# Patient Record
Sex: Female | Born: 1935
Health system: Southern US, Community
[De-identification: ages and names within clinical notes are randomized; demographics above are authoritative.]

## PROBLEM LIST (undated history)

## (undated) DIAGNOSIS — N390 Urinary tract infection, site not specified: Secondary | ICD-10-CM

## (undated) DIAGNOSIS — M949 Disorder of cartilage, unspecified: Secondary | ICD-10-CM

## (undated) DIAGNOSIS — C50911 Malignant neoplasm of unspecified site of right female breast: Secondary | ICD-10-CM

## (undated) DIAGNOSIS — C229 Malignant neoplasm of liver, not specified as primary or secondary: Secondary | ICD-10-CM

## (undated) DIAGNOSIS — I10 Essential (primary) hypertension: Secondary | ICD-10-CM

## (undated) DIAGNOSIS — F05 Delirium due to known physiological condition: Secondary | ICD-10-CM

## (undated) DIAGNOSIS — F32A Depression, unspecified: Secondary | ICD-10-CM

## (undated) DIAGNOSIS — E079 Disorder of thyroid, unspecified: Secondary | ICD-10-CM

## (undated) DIAGNOSIS — J189 Pneumonia, unspecified organism: Secondary | ICD-10-CM

## (undated) DIAGNOSIS — G252 Other specified forms of tremor: Secondary | ICD-10-CM

## (undated) DIAGNOSIS — R410 Disorientation, unspecified: Secondary | ICD-10-CM

## (undated) DIAGNOSIS — F329 Major depressive disorder, single episode, unspecified: Secondary | ICD-10-CM

## (undated) DIAGNOSIS — K620 Anal polyp: Secondary | ICD-10-CM

## (undated) DIAGNOSIS — C50919 Malignant neoplasm of unspecified site of unspecified female breast: Secondary | ICD-10-CM

## (undated) DIAGNOSIS — G250 Essential tremor: Secondary | ICD-10-CM

## (undated) DIAGNOSIS — K5900 Constipation, unspecified: Secondary | ICD-10-CM

## (undated) DIAGNOSIS — K621 Rectal polyp: Secondary | ICD-10-CM

## (undated) DIAGNOSIS — K5732 Diverticulitis of large intestine without perforation or abscess without bleeding: Secondary | ICD-10-CM

## (undated) DIAGNOSIS — K219 Gastro-esophageal reflux disease without esophagitis: Secondary | ICD-10-CM

## (undated) DIAGNOSIS — Z8489 Family history of other specified conditions: Secondary | ICD-10-CM

## (undated) DIAGNOSIS — I639 Cerebral infarction, unspecified: Secondary | ICD-10-CM

## (undated) DIAGNOSIS — E039 Hypothyroidism, unspecified: Secondary | ICD-10-CM

## (undated) DIAGNOSIS — M899 Disorder of bone, unspecified: Secondary | ICD-10-CM

## (undated) DIAGNOSIS — K5730 Diverticulosis of large intestine without perforation or abscess without bleeding: Secondary | ICD-10-CM

## (undated) DIAGNOSIS — E785 Hyperlipidemia, unspecified: Secondary | ICD-10-CM

## (undated) HISTORY — DX: Rectal polyp: K62.1

## (undated) HISTORY — DX: Essential (primary) hypertension: I10

## (undated) HISTORY — DX: Essential tremor: G25.2

## (undated) HISTORY — DX: Disorder of bone, unspecified: M89.9

## (undated) HISTORY — DX: Hypothyroidism, unspecified: E03.9

## (undated) HISTORY — PX: CATARACT EXTRACTION W/ INTRAOCULAR LENS  IMPLANT, BILATERAL: SHX1307

## (undated) HISTORY — PX: ABDOMINAL HYSTERECTOMY: SHX81

## (undated) HISTORY — DX: Diverticulosis of large intestine without perforation or abscess without bleeding: K57.30

## (undated) HISTORY — DX: Essential tremor: G25.0

## (undated) HISTORY — DX: Delirium due to known physiological condition: F05

## (undated) HISTORY — DX: Constipation, unspecified: K59.00

## (undated) HISTORY — DX: Hyperlipidemia, unspecified: E78.5

## (undated) HISTORY — DX: Malignant neoplasm of unspecified site of unspecified female breast: C50.919

## (undated) HISTORY — DX: Disorientation, unspecified: R41.0

## (undated) HISTORY — PX: CHOLECYSTECTOMY: SHX55

## (undated) HISTORY — PX: RECTAL POLYPECTOMY: SHX2309

## (undated) HISTORY — DX: Anal polyp: K62.0

## (undated) HISTORY — DX: Disorder of cartilage, unspecified: M94.9

## (undated) HISTORY — PX: TUBAL LIGATION: SHX77

## (undated) HISTORY — DX: Disorder of thyroid, unspecified: E07.9

## (undated) HISTORY — PX: APPENDECTOMY: SHX54

---
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
## 1969-08-13 DIAGNOSIS — C50911 Malignant neoplasm of unspecified site of right female breast: Secondary | ICD-10-CM

## 1969-08-13 HISTORY — PX: MASTECTOMY: SHX3

## 1969-08-13 HISTORY — PX: BREAST BIOPSY: SHX20

## 1969-08-13 HISTORY — DX: Malignant neoplasm of unspecified site of right female breast: C50.911

## 2001-08-14 LAB — HM COLONOSCOPY

## 2005-06-18 ENCOUNTER — Ambulatory Visit (HOSPITAL_COMMUNITY): Admission: RE | Admit: 2005-06-18 | Discharge: 2005-06-18 | Payer: Self-pay | Admitting: Family Medicine

## 2006-10-23 ENCOUNTER — Ambulatory Visit (HOSPITAL_COMMUNITY): Admission: RE | Admit: 2006-10-23 | Discharge: 2006-10-23 | Payer: Self-pay | Admitting: Family Medicine

## 2007-11-17 ENCOUNTER — Ambulatory Visit (HOSPITAL_COMMUNITY): Admission: RE | Admit: 2007-11-17 | Discharge: 2007-11-17 | Payer: Self-pay | Admitting: Family Medicine

## 2009-01-05 ENCOUNTER — Encounter: Admission: RE | Admit: 2009-01-05 | Discharge: 2009-01-05 | Payer: Self-pay | Admitting: Family Medicine

## 2010-03-27 ENCOUNTER — Encounter: Admission: RE | Admit: 2010-03-27 | Discharge: 2010-03-27 | Payer: Self-pay | Admitting: Internal Medicine

## 2010-03-30 ENCOUNTER — Ambulatory Visit (HOSPITAL_COMMUNITY): Admission: RE | Admit: 2010-03-30 | Discharge: 2010-03-30 | Payer: Self-pay | Admitting: Internal Medicine

## 2010-03-31 ENCOUNTER — Encounter: Admission: RE | Admit: 2010-03-31 | Discharge: 2010-03-31 | Payer: Self-pay | Admitting: Internal Medicine

## 2010-04-02 ENCOUNTER — Encounter: Admission: RE | Admit: 2010-04-02 | Discharge: 2010-04-02 | Payer: Self-pay | Admitting: Internal Medicine

## 2010-04-05 ENCOUNTER — Encounter: Admission: RE | Admit: 2010-04-05 | Discharge: 2010-04-05 | Payer: Self-pay | Admitting: Internal Medicine

## 2010-04-05 LAB — HM MAMMOGRAPHY

## 2010-09-03 ENCOUNTER — Encounter: Payer: Self-pay | Admitting: Internal Medicine

## 2011-02-05 ENCOUNTER — Ambulatory Visit: Payer: Self-pay | Admitting: Gastroenterology

## 2011-03-15 ENCOUNTER — Emergency Department (HOSPITAL_COMMUNITY): Payer: Medicare Other

## 2011-03-15 ENCOUNTER — Emergency Department (HOSPITAL_COMMUNITY)
Admission: EM | Admit: 2011-03-15 | Discharge: 2011-03-15 | Disposition: A | Discharge status: Home / Self Care | Payer: Medicare Other | Attending: Emergency Medicine | Admitting: Emergency Medicine

## 2011-03-15 DIAGNOSIS — Z7982 Long term (current) use of aspirin: Secondary | ICD-10-CM | POA: Insufficient documentation

## 2011-03-15 DIAGNOSIS — I10 Essential (primary) hypertension: Secondary | ICD-10-CM | POA: Insufficient documentation

## 2011-03-15 DIAGNOSIS — R143 Flatulence: Secondary | ICD-10-CM | POA: Insufficient documentation

## 2011-03-15 DIAGNOSIS — E039 Hypothyroidism, unspecified: Secondary | ICD-10-CM | POA: Insufficient documentation

## 2011-03-15 DIAGNOSIS — R141 Gas pain: Secondary | ICD-10-CM | POA: Insufficient documentation

## 2011-03-15 DIAGNOSIS — Z79899 Other long term (current) drug therapy: Secondary | ICD-10-CM | POA: Insufficient documentation

## 2011-03-15 DIAGNOSIS — R142 Eructation: Secondary | ICD-10-CM | POA: Insufficient documentation

## 2011-03-15 DIAGNOSIS — K5900 Constipation, unspecified: Secondary | ICD-10-CM | POA: Insufficient documentation

## 2011-08-22 DIAGNOSIS — M1710 Unilateral primary osteoarthritis, unspecified knee: Secondary | ICD-10-CM | POA: Diagnosis not present

## 2011-09-15 LAB — HM DEXA SCAN

## 2011-10-15 DIAGNOSIS — I10 Essential (primary) hypertension: Secondary | ICD-10-CM | POA: Diagnosis not present

## 2011-10-15 DIAGNOSIS — E039 Hypothyroidism, unspecified: Secondary | ICD-10-CM | POA: Diagnosis not present

## 2011-10-15 DIAGNOSIS — M899 Disorder of bone, unspecified: Secondary | ICD-10-CM | POA: Diagnosis not present

## 2011-10-15 DIAGNOSIS — M949 Disorder of cartilage, unspecified: Secondary | ICD-10-CM | POA: Diagnosis not present

## 2011-10-15 DIAGNOSIS — K5900 Constipation, unspecified: Secondary | ICD-10-CM | POA: Diagnosis not present

## 2012-05-16 ENCOUNTER — Ambulatory Visit (INDEPENDENT_AMBULATORY_CARE_PROVIDER_SITE_OTHER): Payer: Medicare Other | Admitting: Family Medicine

## 2012-05-16 VITALS — BP 123/73 | HR 74 | Temp 98.5°F | Resp 18 | Ht 63.5 in | Wt 178.0 lb

## 2012-05-16 DIAGNOSIS — R229 Localized swelling, mass and lump, unspecified: Secondary | ICD-10-CM | POA: Diagnosis not present

## 2012-05-16 DIAGNOSIS — B999 Unspecified infectious disease: Secondary | ICD-10-CM | POA: Diagnosis not present

## 2012-05-16 MED ORDER — FLUCONAZOLE 150 MG PO TABS: 150.0000 mg | ORAL_TABLET | Freq: Once | ORAL | Status: DC

## 2012-05-16 MED ORDER — AMOXICILLIN 500 MG PO TABS: 500.0000 mg | ORAL_TABLET | Freq: Three times a day (TID) | ORAL | Status: DC

## 2012-05-16 NOTE — Progress Notes (Signed)
[facility]:  Office Visit  Chief Complaint:  Chief Complaint  Patient presents with  . Facial Pain    sensitivity on right side of face, swelling, ? salivary gland inflamed    HPI: Destiny Velasquez is a 78 y.o. female who complains of  Right mandibular swelling x 3 days worse since last night. + sensitivity and pain. Has not tried any oral meds for it except cold compresses. No sick contacts, bug bites, skin infections. No other sxs.   Past Medical History  Diagnosis Date  . Hypertension   . Hyperlipidemia   . Thyroid disease    History reviewed. No pertinent past surgical history. History   Social History  . Marital Status: Married    Spouse Name: N/A    Number of Children: N/A  . Years of Education: N/A   Social History Main Topics  . Smoking status: Former Research scientist (life sciences)  . Smokeless tobacco: None  . Alcohol Use: No  . Drug Use: No  . Sexually Active: None   Other Topics Concern  . None   Social History Narrative  . None   Family History  Problem Relation Age of Onset  . Cancer Mother   . Hypertension Father    No Known Allergies Prior to Admission medications   Medication Sig Start Date End Date Taking? Authorizing Provider  aspirin 81 MG tablet Take 81 mg by mouth daily.   Yes Historical Provider, MD  estrogens, conjugated, (PREMARIN) 0.625 MG tablet Take 0.625 mg by mouth daily. Take daily for 21 days then do not take for 7 days.   Yes Historical Provider, MD  levothyroxine (SYNTHROID, LEVOTHROID) 100 MCG tablet Take 100 mcg by mouth daily.   Yes Historical Provider, MD  losartan (COZAAR) 50 MG tablet Take 50 mg by mouth daily.   Yes Historical Provider, MD  niacin 500 MG tablet Take 500 mg by mouth daily with breakfast.   Yes Historical Provider, MD  simvastatin (ZOCOR) 40 MG tablet Take 40 mg by mouth every evening.   Yes Historical Provider, MD  spironolactone-hydrochlorothiazide (ALDACTAZIDE) 25-25 MG per tablet Take 1 tablet by mouth daily.    Yes Historical Provider, MD     ROS: The patient denies fevers, chills, night sweats, unintentional weight loss, chest pain, palpitations, wheezing, dyspnea on exertion, nausea, vomiting, abdominal pain, dysuria, hematuria, melena, numbness, weakness, or tingling.   All other systems have been reviewed and were otherwise negative with the exception of those mentioned in the HPI and as above.    PHYSICAL EXAM: Filed Vitals:   05/16/12 1014  BP: 123/73  Pulse: 74  Temp: 98.5 F (36.9 C)  Resp: 18   Filed Vitals:   05/16/12 1014  Height: 5' 3.5" (1.613 m)  Weight: 178 lb (80.74 kg)   Body mass index is 31.04 kg/(m^2).  General: Alert, no acute distress HEENT:  Normocephalic, atraumatic, oropharynx patent. No cavities. Right mandible small soft tissue redness, tender to palpation, ? Abscess underneath. Dentition is in good repair. No masses or lesions inside OP. Cardiovascular:  Regular rate and rhythm, no rubs murmurs or gallops.  No Carotid bruits, radial pulse intact. No pedal edema.  Respiratory: Clear to auscultation bilaterally.  No wheezes, rales, or rhonchi.  No cyanosis, no use of accessory musculature GI: No organomegaly, abdomen is soft and non-tender, positive bowel sounds.  No masses. Skin: No rashes. Neurologic: Facial musculature symmetric. Psychiatric: Patient is appropriate throughout our interaction. Lymphatic: No cervical lymphadenopathy Musculoskeletal: Gait intact.  LABS: No results found for this or any previous visit.   EKG/XRAY:   Primary read interpreted by Dr. Marin Comment at [facility].   ASSESSMENT/PLAN: Encounter Diagnoses  Name Primary?  . Infection Yes  . Localized soft tissue swelling     ? Left mandibular abscess vs soft tissue infection Rx Amoxacillin 500 mg TID Diflucan 150 mg x 2 pills prn for candidiasis F/u prn if worsening sxs  LE, THAO PHUONG, DO 05/16/2012 11:26 AM

## 2012-06-21 DIAGNOSIS — Z23 Encounter for immunization: Secondary | ICD-10-CM | POA: Diagnosis not present

## 2012-07-23 DIAGNOSIS — I10 Essential (primary) hypertension: Secondary | ICD-10-CM | POA: Diagnosis not present

## 2012-07-23 DIAGNOSIS — E039 Hypothyroidism, unspecified: Secondary | ICD-10-CM | POA: Diagnosis not present

## 2012-07-25 DIAGNOSIS — K5900 Constipation, unspecified: Secondary | ICD-10-CM | POA: Diagnosis not present

## 2012-07-25 DIAGNOSIS — C50919 Malignant neoplasm of unspecified site of unspecified female breast: Secondary | ICD-10-CM | POA: Diagnosis not present

## 2012-07-25 DIAGNOSIS — M949 Disorder of cartilage, unspecified: Secondary | ICD-10-CM | POA: Diagnosis not present

## 2012-07-25 DIAGNOSIS — M899 Disorder of bone, unspecified: Secondary | ICD-10-CM | POA: Diagnosis not present

## 2012-07-25 DIAGNOSIS — E039 Hypothyroidism, unspecified: Secondary | ICD-10-CM | POA: Diagnosis not present

## 2012-09-02 DIAGNOSIS — H26499 Other secondary cataract, unspecified eye: Secondary | ICD-10-CM | POA: Diagnosis not present

## 2012-09-02 DIAGNOSIS — H40019 Open angle with borderline findings, low risk, unspecified eye: Secondary | ICD-10-CM | POA: Diagnosis not present

## 2012-12-13 ENCOUNTER — Other Ambulatory Visit: Payer: Self-pay | Admitting: Internal Medicine

## 2013-01-23 ENCOUNTER — Encounter: Payer: Self-pay | Admitting: *Deleted

## 2013-01-26 ENCOUNTER — Encounter: Payer: Medicare Other | Admitting: Internal Medicine

## 2013-01-26 ENCOUNTER — Ambulatory Visit (INDEPENDENT_AMBULATORY_CARE_PROVIDER_SITE_OTHER): Payer: Medicare Other | Admitting: Internal Medicine

## 2013-01-26 VITALS — BP 124/70 | HR 82 | Temp 98.3°F | Resp 18 | Ht 63.0 in | Wt 174.6 lb

## 2013-01-26 DIAGNOSIS — M25561 Pain in right knee: Secondary | ICD-10-CM | POA: Insufficient documentation

## 2013-01-26 DIAGNOSIS — M949 Disorder of cartilage, unspecified: Secondary | ICD-10-CM | POA: Insufficient documentation

## 2013-01-26 DIAGNOSIS — R413 Other amnesia: Secondary | ICD-10-CM | POA: Diagnosis not present

## 2013-01-26 DIAGNOSIS — R7309 Other abnormal glucose: Secondary | ICD-10-CM

## 2013-01-26 DIAGNOSIS — M899 Disorder of bone, unspecified: Secondary | ICD-10-CM

## 2013-01-26 DIAGNOSIS — M25569 Pain in unspecified knee: Secondary | ICD-10-CM

## 2013-01-26 DIAGNOSIS — I10 Essential (primary) hypertension: Secondary | ICD-10-CM | POA: Diagnosis not present

## 2013-01-26 DIAGNOSIS — E079 Disorder of thyroid, unspecified: Secondary | ICD-10-CM

## 2013-01-26 DIAGNOSIS — E782 Mixed hyperlipidemia: Secondary | ICD-10-CM | POA: Insufficient documentation

## 2013-01-26 DIAGNOSIS — E785 Hyperlipidemia, unspecified: Secondary | ICD-10-CM | POA: Diagnosis not present

## 2013-01-26 DIAGNOSIS — R739 Hyperglycemia, unspecified: Secondary | ICD-10-CM

## 2013-01-26 MED ORDER — ESTROGENS CONJUGATED 0.45 MG PO TABS: 0.4500 mg | ORAL_TABLET | Freq: Every day | ORAL | Status: DC

## 2013-01-26 MED ORDER — SIMVASTATIN 40 MG PO TABS: 40.0000 mg | ORAL_TABLET | Freq: Every evening | ORAL | Status: DC

## 2013-01-26 NOTE — Progress Notes (Signed)
Patient ID: Destiny Velasquez, female   DOB: [birth year], 79 y.o.   MRN: [id number] Location:  [facility] / [facility]  Code Status: DNR  No Known Allergies  Chief Complaint  Patient presents with  . Follow-up    medication    HPI: Patient is a 79 y.o. white female seen in the office today for acute (was late so could not attend physical) visit re: medication.  Wants her EV asap.    Needs refills on premarin.  Is $300 each.  Was taking for vaginal dryness--so dry it hurt to walk and it did help that.    Has had an ailment.  Does exercise 3 times per week.  Left shoulder was sore 48 hours after exercising--thought overdid free weights.  Then other shoulder started to hurt.  Cut back on weight.  Then, got pain between shoulders.  Then moved down lower back, then hips, knees, ankles.  Was worst when got up in the morning.  Gets better throughout day.  Is improved, but not gone.  Right leg and knee and calf still bothersome now.  Has residue of spider bites and red, dry area on the back of her leg that has not gone away.  Has appt in November to see dermatologist.  Is still taking simvastatin.  Wonders if that is the problem, but her pain is in her joints, not muscles.  Cholesterol level last time was at goal with LDL being 92 on zocor 40mg .    Review of Systems:  Review of Systems  Constitutional: Positive for malaise/fatigue. Negative for fever and chills.  HENT: Negative for congestion and sore throat.   Eyes: Negative for blurred vision.  Respiratory: Negative for cough and shortness of breath.   Cardiovascular: Negative for chest pain, palpitations and leg swelling.  Gastrointestinal: Negative for abdominal pain, constipation, blood in stool and melena.  Genitourinary: Negative for dysuria.  Musculoskeletal: Positive for joint pain and myalgias. Negative for falls.  Skin: Positive for rash.  Neurological: Negative for dizziness and headaches.   Psychiatric/Behavioral: Positive for memory loss.     Past Medical History  Diagnosis Date  . Hypertension   . Hyperlipidemia   . Thyroid disease   . Subacute delirium   . Essential and other specified forms of tremor   . Malignant neoplasm of breast (female), unspecified site   . Unspecified hypothyroidism   . Diverticulosis of colon (without mention of hemorrhage)   . Unspecified constipation   . Anal and rectal polyp   . Disorder of bone and cartilage, unspecified   . Memory loss     Past Surgical History  Procedure Laterality Date  . Mastectomy  1971    Right  . Cholecystectomy    . Appendectomy    . Abdominal hysterectomy      Social History:   reports that she has quit smoking. She does not have any smokeless tobacco history on file. She reports that she does not drink alcohol or use illicit drugs.  Family History  Problem Relation Age of Onset  . Cancer Mother   . Hypertension Father     Medications: Patient's Medications  New Prescriptions   No medications on file  Previous Medications   ASPIRIN 81 MG TABLET    Take 81 mg by mouth daily.   CHOLECALCIFEROL (VITAMIN D) 2000 UNITS TABLET    Take 2,000 Units by mouth daily.   ESTROGENS, CONJUGATED, (PREMARIN) 0.625 MG TABLET    Take 0.625 mg  by mouth daily. Take daily for 21 days then do not take for 7 days.   LEVOTHYROXINE (SYNTHROID, LEVOTHROID) 100 MCG TABLET    Take 100 mcg by mouth daily.   LOSARTAN (COZAAR) 50 MG TABLET    Take 50 mg by mouth daily.   NIACIN 500 MG TABLET    Take 500 mg by mouth daily with breakfast.   SIMVASTATIN (ZOCOR) 40 MG TABLET    Take 40 mg by mouth every evening.   SPIRONOLACTONE-HYDROCHLOROTHIAZIDE (ALDACTAZIDE) 25-25 MG PER TABLET    TAKE ONE-HALF TABLET BY MOUTH DAILY  Modified Medications   No medications on file  Discontinued Medications   No medications on file     Physical Exam: Filed Vitals:   01/26/13 0928  BP: 124/70  Pulse: 82  Temp: 98.3 F (36.8 C)   TempSrc: Oral  Resp: 18  Height: 5\' 3"  (1.6 m)  Weight: 174 lb 9.6 oz (79.198 kg)  SpO2: 98%  Physical Exam  Constitutional: She is oriented to person, place, and time. She appears well-developed and well-nourished. No distress.  HENT:  Head: Normocephalic and atraumatic.  Right Ear: External ear normal.  Left Ear: External ear normal.  Nose: Nose normal.  Mouth/Throat: Oropharynx is clear and moist. No oropharyngeal exudate.  Eyes: Conjunctivae and EOM are normal. Pupils are equal, round, and reactive to light.  Neck: Normal range of motion. Neck supple. No JVD present. No tracheal deviation present. No thyromegaly present.  Cardiovascular: Normal rate, regular rhythm, normal heart sounds and intact distal pulses.   Pulmonary/Chest: Effort normal and breath sounds normal. No respiratory distress.  Abdominal: Soft. Bowel sounds are normal. She exhibits no distension. There is no tenderness.  Musculoskeletal: Normal range of motion. She exhibits no edema and no tenderness.  Lymphadenopathy:    She has no cervical adenopathy.  Neurological: She is alert and oriented to person, place, and time. She has normal reflexes.  Skin: Skin is warm and dry.  Psychiatric: She has a normal mood and affect.  Poor memory   Assessment/Plan 1. Hypertension bp at goal today  2. Hyperlipidemia Cont zocor and exercise, highly doubt her asymmetric joint related pain is due to statin - simvastatin (ZOCOR) 40 MG tablet; Take 1 tablet (40 mg total) by mouth every evening.  Dispense: 90 tablet; Refill: 3  3. Thyroid disease - TSH  4. Memory loss -seems to have poor recall of most of my advice--unclear if this is intentional -needs memory rechecked at next routine visit  5. Disorder of bone and cartilage, unspecified -continues premarin against by advice -encouraged vitamin d and calcium--does not want to take calcium due to constipation--discussed magnesium use for this, high fiber diet  6.  Arthralgia of right knee -unclear etiology aside from typical osteoarthritis which is most likely - CMP - Sedimentation Rate  7. Arthralgia of right lower leg -unusual--no clear etiology--would attribute to knee osteoarthritis, but pt will not accept this and wants further workup - CBC with Differential - CMP - Sedimentation Rate  8. Hyperglycemia - CBC with Differential - Hemoglobin A1c  Labs/tests ordered:  Cbc, hba1c, cmp, ESR Next appt:

## 2013-01-27 LAB — CBC WITH DIFFERENTIAL/PLATELET
Basophils Absolute: 0.1 10*3/uL (ref 0.0–0.2)
Basos: 1 % (ref 0–3)
Eos: 2 % (ref 0–5)
Eosinophils Absolute: 0.2 10*3/uL (ref 0.0–0.4)
HCT: 39.8 % (ref 34.0–46.6)
Hemoglobin: 13.6 g/dL (ref 11.1–15.9)
Immature Grans (Abs): 0 10*3/uL (ref 0.0–0.1)
Immature Granulocytes: 0 % (ref 0–2)
Lymphocytes Absolute: 1.8 10*3/uL (ref 0.7–3.1)
Lymphs: 21 % (ref 14–46)
MCH: 28.7 pg (ref 26.6–33.0)
MCHC: 34.2 g/dL (ref 31.5–35.7)
MCV: 84 fL (ref 79–97)
Monocytes Absolute: 0.6 10*3/uL (ref 0.1–0.9)
Monocytes: 7 % (ref 4–12)
Neutrophils Absolute: 5.8 10*3/uL (ref 1.4–7.0)
Neutrophils Relative %: 69 % (ref 40–74)
RBC: 4.74 x10E6/uL (ref 3.77–5.28)
RDW: 13.1 % (ref 12.3–15.4)
WBC: 8.4 10*3/uL (ref 3.4–10.8)

## 2013-01-27 LAB — COMPREHENSIVE METABOLIC PANEL
ALT: 15 IU/L (ref 0–32)
AST: 18 IU/L (ref 0–40)
Albumin/Globulin Ratio: 1.4 (ref 1.1–2.5)
Albumin: 3.9 g/dL (ref 3.5–4.7)
Alkaline Phosphatase: 60 IU/L (ref 39–117)
BUN/Creatinine Ratio: 14 (ref 11–26)
BUN: 15 mg/dL (ref 8–27)
CO2: 25 mmol/L (ref 18–29)
Calcium: 10.4 mg/dL — ABNORMAL HIGH (ref 8.6–10.2)
Chloride: 100 mmol/L (ref 97–108)
Creatinine, Ser: 1.06 mg/dL — ABNORMAL HIGH (ref 0.57–1.00)
GFR calc Af Amer: 56 mL/min/{1.73_m2} — ABNORMAL LOW (ref >59)
GFR calc non Af Amer: 48 mL/min/{1.73_m2} — ABNORMAL LOW (ref >59)
Globulin, Total: 2.8 g/dL (ref 1.5–4.5)
Glucose: 125 mg/dL — ABNORMAL HIGH (ref 65–99)
Potassium: 4.8 mmol/L (ref 3.5–5.2)
Sodium: 139 mmol/L (ref 134–144)
Total Bilirubin: 0.3 mg/dL (ref 0.0–1.2)
Total Protein: 6.7 g/dL (ref 6.0–8.5)

## 2013-01-27 LAB — SEDIMENTATION RATE: Sed Rate: 31 mm/hr (ref 0–40)

## 2013-01-27 LAB — HEMOGLOBIN A1C
Est. average glucose Bld gHb Est-mCnc: 148 mg/dL
Hgb A1c MFr Bld: 6.8 % — ABNORMAL HIGH (ref 4.8–5.6)

## 2013-01-27 LAB — TSH: TSH: 3.97 u[IU]/mL (ref 0.450–4.500)

## 2013-02-12 ENCOUNTER — Other Ambulatory Visit: Payer: Self-pay | Admitting: Geriatric Medicine

## 2013-02-12 ENCOUNTER — Other Ambulatory Visit: Payer: Medicare Other

## 2013-02-12 DIAGNOSIS — E785 Hyperlipidemia, unspecified: Secondary | ICD-10-CM

## 2013-02-13 LAB — LIPID PANEL
Chol/HDL Ratio: 2.7 ratio units (ref 0.0–4.4)
Cholesterol, Total: 189 mg/dL (ref 100–199)
HDL: 70 mg/dL (ref >39)
LDL Calculated: 77 mg/dL (ref 0–99)
Triglycerides: 210 mg/dL — ABNORMAL HIGH (ref 0–149)
VLDL Cholesterol Cal: 42 mg/dL — ABNORMAL HIGH (ref 5–40)

## 2013-02-16 ENCOUNTER — Ambulatory Visit (INDEPENDENT_AMBULATORY_CARE_PROVIDER_SITE_OTHER): Payer: Medicare Other | Admitting: Internal Medicine

## 2013-02-16 ENCOUNTER — Encounter: Payer: Self-pay | Admitting: Internal Medicine

## 2013-02-16 VITALS — BP 122/78 | HR 75 | Temp 98.3°F | Resp 16 | Ht 63.78 in | Wt 172.0 lb

## 2013-02-16 DIAGNOSIS — R739 Hyperglycemia, unspecified: Secondary | ICD-10-CM

## 2013-02-16 DIAGNOSIS — M949 Disorder of cartilage, unspecified: Secondary | ICD-10-CM

## 2013-02-16 DIAGNOSIS — E785 Hyperlipidemia, unspecified: Secondary | ICD-10-CM

## 2013-02-16 DIAGNOSIS — R7309 Other abnormal glucose: Secondary | ICD-10-CM

## 2013-02-16 DIAGNOSIS — G729 Myopathy, unspecified: Secondary | ICD-10-CM | POA: Diagnosis not present

## 2013-02-16 DIAGNOSIS — M899 Disorder of bone, unspecified: Secondary | ICD-10-CM | POA: Diagnosis not present

## 2013-02-16 DIAGNOSIS — N898 Other specified noninflammatory disorders of vagina: Secondary | ICD-10-CM

## 2013-02-16 DIAGNOSIS — N9089 Other specified noninflammatory disorders of vulva and perineum: Secondary | ICD-10-CM

## 2013-02-16 DIAGNOSIS — E079 Disorder of thyroid, unspecified: Secondary | ICD-10-CM

## 2013-02-16 DIAGNOSIS — N9489 Other specified conditions associated with female genital organs and menstrual cycle: Secondary | ICD-10-CM

## 2013-02-16 MED ORDER — ESTROGENS CONJUGATED 0.3 MG PO TABS: 0.3000 mg | ORAL_TABLET | Freq: Every day | ORAL | Status: DC

## 2013-02-16 MED ORDER — ESTROGENS CONJUGATED 0.45 MG PO TABS: 0.4500 mg | ORAL_TABLET | Freq: Every day | ORAL | Status: DC

## 2013-02-16 NOTE — Progress Notes (Signed)
Patient ID: Destiny Velasquez, female   DOB: 28-Oct-1925, 79 y.o.   MRN: [id number] Location:  [facility] / [place]  No Known Allergies  Chief Complaint  Patient presents with  . Annual Exam    medication changes    HPI: Patient is a 79 y.o. white female seen in the office today for her annual physical.  "Not too bad off."    Can barely walk when first gets up in the morning.  Aching started in her upper back and moved down shoulders to back and into legs.  Hasn't changed her medicines.  Says the pain is in the muscles for sure.  Both legs.  It is gradually improving.  Her husband believes she had a virus that she is recovering from at this time.  She had no other symptoms.  Last visit, I had done a workup for an autoimmune cause and her ESR was wnl.  Her CK was also normal.    Had lipids done Friday and they are at goal with statin-reviewed with her today.  She continues to refuse any follow up on her prior abnormal mammogram that revealed calcifications.  She tells me she has had large swelling of the right axilla since her surgery years ago.  She has no concerns whatsoever about her breasts.  She is aware of the consequences if she does not follow-up on the calcifications.  She also continues on premarin.  I have decreased the dose last visit.  She is tolerating 0.45mg  ok, and is willing to try 0.3mg  for a month to see how it goes.  She also refuses a rectal exam.  She denies hematochezia, melena, nausea, vomiting.  She does have constipation--goes once every 3-4 days, but tells me she knows how to handle it.  Review of Systems:  Review of Systems  Constitutional: Positive for weight loss. Negative for fever and chills.       No significant weight loss on our record  HENT: Negative for congestion.   Eyes: Negative for blurred vision.  Respiratory: Negative for cough and shortness of breath.   Cardiovascular: Negative for chest pain and leg swelling.   Gastrointestinal: Positive for constipation. Negative for heartburn, nausea, vomiting, abdominal pain, diarrhea, blood in stool and melena.  Genitourinary: Negative for dysuria.  Musculoskeletal: Positive for myalgias. Negative for joint pain and falls.  Skin: Negative for itching.       Has area on her right calf and another on her shin for which she is seeing dermatology  Neurological: Negative for dizziness and tingling.  Endo/Heme/Allergies: Does not bruise/bleed easily.  Psychiatric/Behavioral: Negative for depression. The patient is not nervous/anxious.     Past Medical History  Diagnosis Date  . Hypertension   . Hyperlipidemia   . Thyroid disease   . Subacute delirium   . Essential and other specified forms of tremor   . Malignant neoplasm of breast (female), unspecified site   . Unspecified hypothyroidism   . Diverticulosis of colon (without mention of hemorrhage)   . Unspecified constipation   . Anal and rectal polyp   . Disorder of bone and cartilage, unspecified   . Memory loss     Past Surgical History  Procedure Laterality Date  . Mastectomy  1971    Right  . Cholecystectomy    . Appendectomy    . Abdominal hysterectomy      Social History:   reports that she has quit smoking. She does not have any smokeless  tobacco history on file. She reports that she does not drink alcohol or use illicit drugs.  Family History  Problem Relation Age of Onset  . Cancer Mother   . Hypertension Father     Medications: Patient's Medications  New Prescriptions   No medications on file  Previous Medications   ASPIRIN 81 MG TABLET    Take 81 mg by mouth daily.   CHOLECALCIFEROL (VITAMIN D) 2000 UNITS TABLET    Take 2,000 Units by mouth daily.   LOSARTAN (COZAAR) 50 MG TABLET    Take 50 mg by mouth daily.   NIACIN 500 MG TABLET    Take 500 mg by mouth daily with breakfast.   SIMVASTATIN (ZOCOR) 40 MG TABLET    Take 1 tablet (40 mg total) by mouth every evening.  Modified  Medications   Modified Medication Previous Medication   ESTROGENS, CONJUGATED, (PREMARIN) 0.45 MG TABLET estrogens, conjugated, (PREMARIN) 0.45 MG tablet      Take 1 tablet (0.45 mg total) by mouth daily.    Take 1 tablet (0.45 mg total) by mouth daily.   LEVOTHYROXINE (SYNTHROID, LEVOTHROID) 100 MCG TABLET levothyroxine (SYNTHROID, LEVOTHROID) 100 MCG tablet      Take 1 tablet by mouth every morning on an empty stomach. Seperate from other meds    Take 100 mcg by mouth daily.   PREMARIN 0.3 MG TABLET estrogens, conjugated, (PREMARIN) 0.45 MG tablet      TAKE ONE TABLET BY MOUTH ONE TIME DAILY     Take 1 tablet (0.45 mg total) by mouth daily.   SPIRONOLACTONE-HYDROCHLOROTHIAZIDE (ALDACTAZIDE) 25-25 MG PER TABLET spironolactone-hydrochlorothiazide (ALDACTAZIDE) 25-25 MG per tablet      TAKE ONE-HALF TABLET BY MOUTH DAILY     TAKE ONE-HALF TABLET BY MOUTH DAILY  Discontinued Medications   No medications on file    Physical Exam: Filed Vitals:   02/16/13 1029  BP: 122/78  Pulse: 75  Temp: 98.3 F (36.8 C)  TempSrc: Oral  Resp: 16  Height: 5' 3.78" (1.62 m)  Weight: 172 lb (78.019 kg)  SpO2: 97%  Physical Exam  Constitutional: She is oriented to person, place, and time. She appears well-developed and well-nourished. No distress.  Overweight white female  HENT:  Head: Normocephalic and atraumatic.  Right Ear: External ear normal.  Left Ear: External ear normal.  Nose: Nose normal.  Mouth/Throat: Oropharynx is clear and moist. No oropharyngeal exudate.  Eyes: Conjunctivae and EOM are normal. Pupils are equal, round, and reactive to light. No scleral icterus.  Neck: Normal range of motion. Neck supple. No tracheal deviation present. No thyromegaly present.  Cardiovascular: Normal rate, regular rhythm, normal heart sounds and intact distal pulses.   Pulmonary/Chest: Effort normal and breath sounds normal.  Right axillary swelling present (pt attests present since breast surgery years  ago)  Abdominal: Soft. Bowel sounds are normal. She exhibits no distension and no mass. There is no tenderness. No hernia.  Musculoskeletal: Normal range of motion. She exhibits no edema and no tenderness.  Neurological: She is alert and oriented to person, place, and time. No cranial nerve deficit.  hyporeflexic  Skin: Skin is warm and dry.  Large baseball sized patch erythematous on right posterior calf, few excoriated areas on right anterior medial shin  Psychiatric: She has a normal mood and affect.  anxious    Labs reviewed: Basic Metabolic Panel:  Recent Labs  01/26/13 1048  NA 139  K 4.8  CL 100  CO2 25  GLUCOSE 125*  BUN 15  CREATININE 1.06*  CALCIUM 10.4*  TSH 3.970   Liver Function Tests:  Recent Labs  01/26/13 1048  AST 18  ALT 15  ALKPHOS 60  BILITOT 0.3  PROT 6.7   CBC:  Recent Labs  01/26/13 1048  WBC 8.4  NEUTROABS 5.8  HGB 13.6  HCT 39.8  MCV 84   Lipid Panel:  Recent Labs  02/12/13 0831  HDL 70  LDLCALC 77  TRIG 210*  CHOLHDL 2.7   Lab Results  Component Value Date   HGBA1C 6.8* 01/26/2013   Lab Results  Component Value Date   TSH 3.970 01/26/2013     Assessment/Plan 1. Hyperglycemia -discussed watching sweets and starchy foods -would not put her on medication at this level given her age and higher risk of hypoglycemia  2. Myopathy -etiology unclear, I do not think this is statin-induced b/c it is not diffuse and her labs have not been suggestive of any muscle involvement  3. Disorder of bone and cartilage, unspecified -advised to cut back further on estrogen therapy to 0.3mg  daily after completing 0.45mg  for a month - estrogens, conjugated, (PREMARIN) 0.45 MG tablet; Take 1 tablet (0.45 mg total) by mouth daily.  Dispense: 30 tablet; Refill: 0  4. Hyperlipidemia -lipids good except triglycerides--again discussed dietary changes to help with this  5. Thyroid disease -last tsh was normal when taking medication  properly  6. Vulvovaginal dryness -is adamant that she needs some estrogen despite my concerns about her breast calcifications and axillary swelling she claims has been present for years, but she had not permitted a full exam up to this point -refuses further workup of breast calcifications or even repeat mammography--risks explained and she expressed understanding  She refuses preventive care approaches including DRE, mammogram, f/u bone density.    Labs/tests ordered:  Will do labs at next visit Next appt:  6 mos

## 2013-02-16 NOTE — Patient Instructions (Addendum)
Try to watch your sweets and do a little more walking--your sugar average is elevated.    Try to 0.3mg  premarin and see how you tolerate it.  Let me know how it goes.

## 2013-03-26 ENCOUNTER — Other Ambulatory Visit: Payer: Self-pay | Admitting: Internal Medicine

## 2013-05-27 DIAGNOSIS — Z23 Encounter for immunization: Secondary | ICD-10-CM | POA: Diagnosis not present

## 2013-07-24 ENCOUNTER — Encounter: Payer: Self-pay | Admitting: Internal Medicine

## 2013-07-24 ENCOUNTER — Other Ambulatory Visit: Payer: Self-pay | Admitting: Internal Medicine

## 2013-08-17 ENCOUNTER — Ambulatory Visit: Payer: Self-pay | Admitting: Internal Medicine

## 2013-09-02 ENCOUNTER — Other Ambulatory Visit: Payer: Self-pay

## 2013-09-04 ENCOUNTER — Ambulatory Visit: Payer: Self-pay | Admitting: Internal Medicine

## 2013-09-11 ENCOUNTER — Other Ambulatory Visit: Payer: Medicare Other

## 2013-09-11 ENCOUNTER — Other Ambulatory Visit: Payer: Self-pay | Admitting: *Deleted

## 2013-09-11 DIAGNOSIS — I10 Essential (primary) hypertension: Secondary | ICD-10-CM

## 2013-09-11 DIAGNOSIS — E785 Hyperlipidemia, unspecified: Secondary | ICD-10-CM | POA: Diagnosis not present

## 2013-09-12 LAB — LIPID PANEL
Chol/HDL Ratio: 2.7 ratio units (ref 0.0–4.4)
Cholesterol, Total: 195 mg/dL (ref 100–199)
HDL: 71 mg/dL (ref >39)
LDL Calculated: 84 mg/dL (ref 0–99)
Triglycerides: 199 mg/dL — ABNORMAL HIGH (ref 0–149)
VLDL Cholesterol Cal: 40 mg/dL (ref 5–40)

## 2013-09-12 LAB — BASIC METABOLIC PANEL
BUN/Creatinine Ratio: 14 (ref 11–26)
BUN: 17 mg/dL (ref 8–27)
CO2: 22 mmol/L (ref 18–29)
Calcium: 10.2 mg/dL (ref 8.7–10.3)
Chloride: 99 mmol/L (ref 97–108)
Creatinine, Ser: 1.24 mg/dL — ABNORMAL HIGH (ref 0.57–1.00)
GFR calc Af Amer: 46 mL/min/{1.73_m2} — ABNORMAL LOW (ref >59)
GFR calc non Af Amer: 40 mL/min/{1.73_m2} — ABNORMAL LOW (ref >59)
Glucose: 122 mg/dL — ABNORMAL HIGH (ref 65–99)
Potassium: 4 mmol/L (ref 3.5–5.2)
Sodium: 139 mmol/L (ref 134–144)

## 2013-09-14 ENCOUNTER — Encounter: Payer: Self-pay | Admitting: Internal Medicine

## 2013-09-14 ENCOUNTER — Ambulatory Visit (INDEPENDENT_AMBULATORY_CARE_PROVIDER_SITE_OTHER): Payer: Medicare Other | Admitting: Internal Medicine

## 2013-09-14 VITALS — BP 124/70 | HR 74 | Temp 98.3°F | Wt 172.6 lb

## 2013-09-14 DIAGNOSIS — E0789 Other specified disorders of thyroid: Secondary | ICD-10-CM | POA: Diagnosis not present

## 2013-09-14 DIAGNOSIS — N9489 Other specified conditions associated with female genital organs and menstrual cycle: Secondary | ICD-10-CM

## 2013-09-14 DIAGNOSIS — M899 Disorder of bone, unspecified: Secondary | ICD-10-CM

## 2013-09-14 DIAGNOSIS — Z23 Encounter for immunization: Secondary | ICD-10-CM

## 2013-09-14 DIAGNOSIS — E785 Hyperlipidemia, unspecified: Secondary | ICD-10-CM | POA: Diagnosis not present

## 2013-09-14 DIAGNOSIS — N9089 Other specified noninflammatory disorders of vulva and perineum: Secondary | ICD-10-CM

## 2013-09-14 DIAGNOSIS — N898 Other specified noninflammatory disorders of vagina: Secondary | ICD-10-CM

## 2013-09-14 DIAGNOSIS — E034 Atrophy of thyroid (acquired): Secondary | ICD-10-CM

## 2013-09-14 DIAGNOSIS — I10 Essential (primary) hypertension: Secondary | ICD-10-CM | POA: Diagnosis not present

## 2013-09-14 DIAGNOSIS — M949 Disorder of cartilage, unspecified: Secondary | ICD-10-CM

## 2013-09-14 MED ORDER — SPIRONOLACTONE-HCTZ 25-25 MG PO TABS: ORAL_TABLET | ORAL | Status: DC

## 2013-09-14 MED ORDER — TETANUS-DIPHTH-ACELL PERTUSSIS 5-2.5-18.5 LF-MCG/0.5 IM SUSP: 0.5000 mL | Freq: Once | INTRAMUSCULAR | Status: DC

## 2013-09-14 MED ORDER — LEVOTHYROXINE SODIUM 100 MCG PO TABS: ORAL_TABLET | ORAL | Status: DC

## 2013-09-14 MED ORDER — ESTROGENS CONJUGATED 0.3 MG PO TABS: ORAL_TABLET | ORAL | Status: DC

## 2013-09-14 NOTE — Progress Notes (Signed)
Patient ID: Destiny Velasquez, female   DOB: 02-Nov-1925, 80 y.o.   MRN: [id number]   Location:  [facility] / [place]  No Known Allergies  Chief Complaint  Patient presents with  . Medical Managment of Chronic Issues    6 month f/u, discuss labs.(printed)  . Immunizations    print Rx for Tdap.    HPI: Patient is a 80 y.o. 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  Wanted to get her bloodwork done.  Is taking niacin nonflush and wonders if she should still take it.    Changing to [employer].  I have been trying to get her to quit premarin since she's seeing me.  Gets vaginal dryness if stops premarin.  Has reduced dose, but still just as expensive--0.3mg  costs as much.    Having aching in feet clear up through her hips.  In an hour to hour and 1/2, feels better and walking normally.  Knows this is a simvastatin.  Tried to go w/o it for 3-4 weeks and did not notice any improvement.    Says she is immobile all evening.  Then goes to bed.  Says she could still toddle out of the house if she had to in an emergency.  Using senna every three days to have bm which doesn't work that well.  Also has miralax she puts in prune juice and doesn't work too well.  Doesn't want another cscope to look for more polyps and not recommended at her age.  Also notes increased gassiness that she finds embarrassing.    Went to dermatologist.  Red place on right leg persists.  No explanation provided.    Review of Systems:  Review of Systems  Constitutional: Positive for malaise/fatigue.  HENT: Negative for congestion.   Eyes: Negative for blurred vision.  Respiratory: Negative for shortness of breath.   Cardiovascular: Negative for chest pain and leg swelling.  Gastrointestinal: Positive for constipation. Negative for abdominal pain, blood in stool and melena.       Flatus  Genitourinary: Negative for urgency and  frequency.       Vaginal dryness  Musculoskeletal: Positive for joint pain.       Stiffness;  Pain in legs  Skin: Negative for rash.       Red place on right leg chronic  Neurological: Negative for dizziness.  Psychiatric/Behavioral: Positive for memory loss.     Past Medical History  Diagnosis Date  . Hypertension   . Hyperlipidemia   . Thyroid disease   . Subacute delirium   . Essential and other specified forms of tremor   . Malignant neoplasm of breast (female), unspecified site   . Unspecified hypothyroidism   . Diverticulosis of colon (without mention of hemorrhage)   . Unspecified constipation   . Anal and rectal polyp   . Disorder of bone and cartilage, unspecified   . Memory loss     Past Surgical History  Procedure Laterality Date  . Mastectomy  1971    Right  . Cholecystectomy    . Appendectomy    . Abdominal hysterectomy      Social History:   reports that she has quit smoking. She does not have any smokeless tobacco history on file. She reports that she does not drink alcohol or use illicit drugs.  Family History  Problem Relation Age of Onset  . Cancer Mother   . Hypertension  Father     Medications: Patient's Medications  New Prescriptions   TDAP (BOOSTRIX) 5-2.5-18.5 LF-MCG/0.5 INJECTION    Inject 0.5 mLs into the muscle once.  Previous Medications   ASPIRIN 81 MG TABLET    Take 81 mg by mouth daily.   BETA CAROTENE W/MINERALS (OCUVITE) TABLET    Take 1 tablet by mouth daily.   CHOLECALCIFEROL (VITAMIN D) 2000 UNITS TABLET    Take 2,000 Units by mouth daily.   LEVOTHYROXINE (SYNTHROID, LEVOTHROID) 100 MCG TABLET    Take 1 tablet by mouth every morning on an empty stomach. Seperate from other meds   LOSARTAN (COZAAR) 50 MG TABLET    Take 50 mg by mouth daily.   NIACIN 500 MG TABLET    Take 500 mg by mouth daily with breakfast.   PREMARIN 0.3 MG TABLET    TAKE ONE TABLET BY MOUTH ONE TIME DAILY    SENNA (SENOKOT) 8.6 MG TABLET    Take 1 tablet by  mouth as needed for constipation.   SIMVASTATIN (ZOCOR) 40 MG TABLET    Take 1 tablet (40 mg total) by mouth every evening.   SPIRONOLACTONE-HYDROCHLOROTHIAZIDE (ALDACTAZIDE) 25-25 MG PER TABLET    TAKE ONE-HALF TABLET BY MOUTH DAILY   Modified Medications   No medications on file  Discontinued Medications   ESTROGENS, CONJUGATED, (PREMARIN) 0.45 MG TABLET    Take 1 tablet (0.45 mg total) by mouth daily.   TDAP (BOOSTRIX) 5-2.5-18.5 LF-MCG/0.5 INJECTION    Inject 0.5 mLs into the muscle once.     Physical Exam: Filed Vitals:   09/14/13 1018  BP: 124/70  Pulse: 74  Temp: 98.3 F (36.8 C)  TempSrc: Oral  Weight: 172 lb 9.6 oz (78.291 kg)  SpO2: 98%  Physical Exam  Constitutional: She is oriented to person, place, and time. She appears well-developed and well-nourished. No distress.  HENT:  Head: Normocephalic and atraumatic.  Neck: Neck supple. No JVD present.  Cardiovascular: Normal rate, regular rhythm, normal heart sounds and intact distal pulses.   Pulmonary/Chest: Effort normal and breath sounds normal. No respiratory distress.  Abdominal: Soft. Bowel sounds are normal. She exhibits no distension and no mass. There is no tenderness.  Musculoskeletal: Normal range of motion. She exhibits no edema and no tenderness.  Lymphadenopathy:    She has no cervical adenopathy.  Neurological: She is alert and oriented to person, place, and time.  Skin:  Small red area on right leg  Psychiatric: She has a normal mood and affect.    Labs reviewed: Basic Metabolic Panel:  Recent Labs  01/26/13 1048 09/11/13 0829  NA 139 139  K 4.8 4.0  CL 100 99  CO2 25 22  GLUCOSE 125* 122*  BUN 15 17  CREATININE 1.06* 1.24*  CALCIUM 10.4* 10.2  TSH 3.970  --    Liver Function Tests:  Recent Labs  01/26/13 1048  AST 18  ALT 15  ALKPHOS 60  BILITOT 0.3  PROT 6.7  CBC:  Recent Labs  01/26/13 1048  WBC 8.4  NEUTROABS 5.8  HGB 13.6  HCT 39.8  MCV 84   Lipid  Panel:  Recent Labs  02/12/13 0831 09/11/13 0829  HDL 70 71  LDLCALC 77 84  TRIG 210* 199*  CHOLHDL 2.7 2.7   Lab Results  Component Value Date   HGBA1C 6.8* 01/26/2013  Assessment/Plan 1. Unspecified essential hypertension -bp at goal with current diuretic and cozaar - spironolactone-hydrochlorothiazide (ALDACTAZIDE) 25-25 MG per tablet; Take 1/2 tablet  by mouth daily  Dispense: 135 tablet; Refill: 1 - CBC with Differential - Comprehensive metabolic panel  2. Other and unspecified hyperlipidemia - taking non flush niacin--will see if it helped her lipids when added to simvastatin therapy - Comprehensive metabolic panel - Lipid panel  3. Hypothyroidism -cont synthroid, proper administration reviewed - TSH  4. Need for prophylactic vaccination with combined diphtheria-tetanus-pertussis (DTP) vaccine -script provided to get tdap at pharmacy primarily for pertussis component--will need to check if she actually got this at next appt - Tdap ([place]) 5-2.5-18.5 LF-MCG/0.5 injection; Inject 0.5 mLs into the muscle once.  Dispense: 0.5 mL; Refill: 0  5. Disorder of bone and cartilage, unspecified -cont vitamin D supplement;  Does not do well taking the calcium part due to severe constipation problems  6. Vulvovaginal dryness -cont to attempt to taper off premarin--she is aware of the side effects and risks of this medication, but has not been able to discontinue it altogether primarily due to the vaginal dryness aspect  Labs/tests ordered: Orders Placed This Encounter  Procedures  . HM DEXA SCAN    This external order was created through the Results Console.  Marland Kitchen HM MAMMOGRAPHY    This external order was created through the Results Console.  Marland Kitchen CBC with Differential  . Comprehensive metabolic panel  . Lipid panel  . TSH  . HM COLONOSCOPY    This external order was created through the Results Console.    Next appt:  6 mos

## 2013-09-14 NOTE — Patient Instructions (Signed)
Fiber tabs Water Walking Premarin every other day

## 2013-09-21 NOTE — Progress Notes (Signed)
Spoke to pt and she doesn't want to come back in and get the TSH drawn.

## 2013-09-23 ENCOUNTER — Other Ambulatory Visit: Payer: Self-pay | Admitting: *Deleted

## 2013-09-23 MED ORDER — LOSARTAN POTASSIUM 50 MG PO TABS: 50.0000 mg | ORAL_TABLET | Freq: Every day | ORAL | Status: DC

## 2013-11-12 ENCOUNTER — Other Ambulatory Visit: Payer: Self-pay | Admitting: *Deleted

## 2013-12-19 ENCOUNTER — Other Ambulatory Visit: Payer: Self-pay | Admitting: Internal Medicine

## 2013-12-19 DIAGNOSIS — N9489 Other specified conditions associated with female genital organs and menstrual cycle: Secondary | ICD-10-CM

## 2013-12-19 DIAGNOSIS — N9089 Other specified noninflammatory disorders of vulva and perineum: Secondary | ICD-10-CM

## 2013-12-21 NOTE — Telephone Encounter (Signed)
I called patient, patient was to stop premarin as instructed @ her last OV in Feb 2015. Patient states she would like to resume taking this medication if the provider is ok with in. Please advise  Dr.[name] is out of office (will send to covering provider)

## 2013-12-21 NOTE — Telephone Encounter (Signed)
Ok to provide for 2 weeks. Will have dr reed review for further continuation. i will cc this to dr reed as well.

## 2013-12-21 NOTE — Telephone Encounter (Signed)
Patient states she would like for message to wait until Dr.[name] returns. Patient has enough medication to last her until next week

## 2013-12-22 NOTE — Telephone Encounter (Signed)
My recommendation is that she discontinue this.  It increases her risk of breast cancer and heart disease.  She may go through some menopausal symptoms regardless of how slowly she tapers this since she did not go through it naturally.  If she chooses to continue it, she must do so with those risks in mind.

## 2013-12-23 MED ORDER — ESTROGENS CONJUGATED 0.3 MG PO TABS: ORAL_TABLET | ORAL | Status: DC

## 2013-12-23 NOTE — Telephone Encounter (Signed)
Patient is aware of risk factors, patient states she will continue this medication despite the risk. Patient states:  " I have taken this medication for years and if the risk factors have not caught up with me yet then I rather continue to avoid vaginal dryness that leads to more problems." patient was requesting that we go ahead and send in #15 pills. Rx sent

## 2013-12-23 NOTE — Addendum Note (Signed)
Addended by: Logan Bores on: 12/23/2013 10:07 AM   Modules accepted: Orders

## 2014-02-07 ENCOUNTER — Other Ambulatory Visit: Payer: Self-pay | Admitting: Internal Medicine

## 2014-02-22 ENCOUNTER — Other Ambulatory Visit: Payer: Self-pay | Admitting: Nurse Practitioner

## 2014-02-22 ENCOUNTER — Other Ambulatory Visit: Payer: Self-pay | Admitting: Internal Medicine

## 2014-03-15 ENCOUNTER — Ambulatory Visit: Payer: Medicare Other | Admitting: Internal Medicine

## 2014-03-16 ENCOUNTER — Other Ambulatory Visit: Payer: Self-pay

## 2014-03-16 DIAGNOSIS — I10 Essential (primary) hypertension: Secondary | ICD-10-CM

## 2014-03-16 DIAGNOSIS — E785 Hyperlipidemia, unspecified: Secondary | ICD-10-CM

## 2014-03-16 DIAGNOSIS — N9489 Other specified conditions associated with female genital organs and menstrual cycle: Secondary | ICD-10-CM

## 2014-03-17 ENCOUNTER — Other Ambulatory Visit: Payer: Medicare Other

## 2014-03-17 DIAGNOSIS — I10 Essential (primary) hypertension: Secondary | ICD-10-CM

## 2014-03-17 DIAGNOSIS — N9489 Other specified conditions associated with female genital organs and menstrual cycle: Secondary | ICD-10-CM | POA: Diagnosis not present

## 2014-03-17 DIAGNOSIS — E785 Hyperlipidemia, unspecified: Secondary | ICD-10-CM

## 2014-03-18 ENCOUNTER — Encounter: Payer: Self-pay | Admitting: Internal Medicine

## 2014-03-18 ENCOUNTER — Ambulatory Visit (INDEPENDENT_AMBULATORY_CARE_PROVIDER_SITE_OTHER): Payer: Medicare Other | Admitting: Internal Medicine

## 2014-03-18 VITALS — BP 140/78 | HR 81 | Temp 99.4°F | Resp 18 | Ht 65.78 in | Wt 165.2 lb

## 2014-03-18 DIAGNOSIS — E785 Hyperlipidemia, unspecified: Secondary | ICD-10-CM

## 2014-03-18 DIAGNOSIS — M949 Disorder of cartilage, unspecified: Secondary | ICD-10-CM

## 2014-03-18 DIAGNOSIS — M899 Disorder of bone, unspecified: Secondary | ICD-10-CM | POA: Diagnosis not present

## 2014-03-18 DIAGNOSIS — N9489 Other specified conditions associated with female genital organs and menstrual cycle: Secondary | ICD-10-CM

## 2014-03-18 DIAGNOSIS — E079 Disorder of thyroid, unspecified: Secondary | ICD-10-CM

## 2014-03-18 DIAGNOSIS — I10 Essential (primary) hypertension: Secondary | ICD-10-CM

## 2014-03-18 DIAGNOSIS — R739 Hyperglycemia, unspecified: Secondary | ICD-10-CM

## 2014-03-18 DIAGNOSIS — N9089 Other specified noninflammatory disorders of vulva and perineum: Secondary | ICD-10-CM

## 2014-03-18 DIAGNOSIS — N898 Other specified noninflammatory disorders of vagina: Secondary | ICD-10-CM

## 2014-03-18 DIAGNOSIS — R7309 Other abnormal glucose: Secondary | ICD-10-CM

## 2014-03-18 LAB — LIPID PANEL
Chol/HDL Ratio: 3 ratio units (ref 0.0–4.4)
Cholesterol, Total: 189 mg/dL (ref 100–199)
HDL: 64 mg/dL (ref >39)
LDL Calculated: 88 mg/dL (ref 0–99)
Triglycerides: 186 mg/dL — ABNORMAL HIGH (ref 0–149)
VLDL Cholesterol Cal: 37 mg/dL (ref 5–40)

## 2014-03-18 LAB — COMPREHENSIVE METABOLIC PANEL
ALT: 14 IU/L (ref 0–32)
AST: 17 IU/L (ref 0–40)
Albumin/Globulin Ratio: 1.3 (ref 1.1–2.5)
Albumin: 3.9 g/dL (ref 3.5–4.7)
Alkaline Phosphatase: 71 IU/L (ref 39–117)
BUN/Creatinine Ratio: 15 (ref 11–26)
BUN: 21 mg/dL (ref 8–27)
CO2: 22 mmol/L (ref 18–29)
Calcium: 10.6 mg/dL — ABNORMAL HIGH (ref 8.7–10.3)
Chloride: 97 mmol/L (ref 97–108)
Creatinine, Ser: 1.38 mg/dL — ABNORMAL HIGH (ref 0.57–1.00)
GFR calc Af Amer: 40 mL/min/{1.73_m2} — ABNORMAL LOW (ref >59)
GFR calc non Af Amer: 35 mL/min/{1.73_m2} — ABNORMAL LOW (ref >59)
Globulin, Total: 2.9 g/dL (ref 1.5–4.5)
Glucose: 131 mg/dL — ABNORMAL HIGH (ref 65–99)
Potassium: 3.9 mmol/L (ref 3.5–5.2)
Sodium: 136 mmol/L (ref 134–144)
Total Bilirubin: 0.3 mg/dL (ref 0.0–1.2)
Total Protein: 6.8 g/dL (ref 6.0–8.5)

## 2014-03-18 LAB — CBC WITH DIFFERENTIAL/PLATELET
Basophils Absolute: 0.1 10*3/uL (ref 0.0–0.2)
Basos: 1 %
Eos: 4 %
Eosinophils Absolute: 0.3 10*3/uL (ref 0.0–0.4)
HCT: 40.2 % (ref 34.0–46.6)
Hemoglobin: 13.8 g/dL (ref 11.1–15.9)
Immature Grans (Abs): 0 10*3/uL (ref 0.0–0.1)
Immature Granulocytes: 0 %
Lymphocytes Absolute: 2.1 10*3/uL (ref 0.7–3.1)
Lymphs: 25 %
MCH: 29.1 pg (ref 26.6–33.0)
MCHC: 34.3 g/dL (ref 31.5–35.7)
MCV: 85 fL (ref 79–97)
Monocytes Absolute: 0.8 10*3/uL (ref 0.1–0.9)
Monocytes: 10 %
Neutrophils Absolute: 5.1 10*3/uL (ref 1.4–7.0)
Neutrophils Relative %: 60 %
RBC: 4.75 x10E6/uL (ref 3.77–5.28)
RDW: 13.6 % (ref 12.3–15.4)
WBC: 8.3 10*3/uL (ref 3.4–10.8)

## 2014-03-18 LAB — TSH: TSH: 1.27 u[IU]/mL (ref 0.450–4.500)

## 2014-03-18 MED ORDER — ESTROGENS CONJUGATED 0.3 MG PO TABS: ORAL_TABLET | ORAL | Status: DC

## 2014-03-18 MED ORDER — LOSARTAN POTASSIUM 50 MG PO TABS: ORAL_TABLET | ORAL | Status: DC

## 2014-03-18 NOTE — Patient Instructions (Signed)
Please bring [place] or mail [place] a copy of your living will and health care power of attorney.

## 2014-03-18 NOTE — Progress Notes (Signed)
Patient ID: Destiny Velasquez, female   DOB: 01-06-1926, 80 y.o.   MRN: [id number]   Location:  [facility] / [place] [facility]  Code Status: has living will and power of attorney not in chart--will bring copy  No Known Allergies  Chief Complaint  Patient presents with  . Medical Management of Chronic Issues    HPI: Patient is a 80 y.o. white female seen in the office today for med mgt of chronic diseases.  Says they eat only small servings regularly.  Says their diet is very boring and they don't eat out much.    Triglycerides remain elevated but slightly improved. BP at upper limits of normal TSH was normal  No new things.  Haven't traveled this year.  Has lost three lbs.    Was down to every other day of premarin.  Wants to keep low dose every other day b/c she worries about the potential side effects of stopping altogether.  Says she had pertussis as an adult.  Refuses to get her tdap at this point.  Feels like muscle pain feet clear up to lower back, seems to wear off after breakfast.  First called stiffness, then denied that.  She has some tingling in her feet at night time also.  Still walking and taking 2 exercise classes per week.  Does silver sneakers instead.  Is convinced she has no arthritis.    Still has red place on posterior right calf area.  Dermatologist was not concerned about it--nonpainful.  Some days can barely see it.  Had spider bites there before.    Review of Systems:  Review of Systems  Constitutional: Negative for fever and malaise/fatigue.  HENT: Negative for congestion.   Eyes: Negative for blurred vision.  Respiratory: Negative for cough and shortness of breath.   Cardiovascular: Negative for chest pain and leg swelling.  Gastrointestinal: Negative for heartburn, diarrhea, constipation, blood in stool and melena.       Bowels moved well this week, did not need to use her prune juice, celery and miralax  Genitourinary: Negative for  dysuria, urgency and frequency.  Musculoskeletal: Positive for joint pain and myalgias. Negative for falls.  Skin: Negative for rash.  Neurological: Negative for dizziness and headaches.  Psychiatric/Behavioral: Positive for memory loss. Negative for depression. The patient does not have insomnia.      Past Medical History  Diagnosis Date  . Hypertension   . Hyperlipidemia   . Thyroid disease   . Subacute delirium   . Essential and other specified forms of tremor   . Malignant neoplasm of breast (female), unspecified site   . Unspecified hypothyroidism   . Diverticulosis of colon (without mention of hemorrhage)   . Unspecified constipation   . Anal and rectal polyp   . Disorder of bone and cartilage, unspecified   . Memory loss     Past Surgical History  Procedure Laterality Date  . Mastectomy  1971    Right  . Cholecystectomy    . Appendectomy    . Abdominal hysterectomy      Social History:   reports that she has quit smoking. She does not have any smokeless tobacco history on file. She reports that she does not drink alcohol or use illicit drugs.  Family History  Problem Relation Age of Onset  . Cancer Mother   . Hypertension Father     Medications: Patient's Medications  New Prescriptions   No medications on file  Previous Medications  ASPIRIN 81 MG TABLET    Take 81 mg by mouth daily.   BETA CAROTENE W/MINERALS (OCUVITE) TABLET    Take 1 tablet by mouth daily.   CHOLECALCIFEROL (VITAMIN D) 2000 UNITS TABLET    Take 2,000 Units by mouth daily.   LEVOTHYROXINE (SYNTHROID, LEVOTHROID) 100 MCG TABLET    TAKE 1 TABLET BY MOUTH EVERY MORNING ON AN EMPTY STOMACH.  SEPARATE FROM OTHER MEDS   LOSARTAN (COZAAR) 50 MG TABLET    TAKE 1 TABLET (50 MG TOTAL) BY MOUTH DAILY.   NIACIN 500 MG TABLET    Take 500 mg by mouth daily with breakfast.   PREMARIN 0.3 MG TABLET    TAKE ONE TABLET BY MOUTH EVERY OTHER DAY FOR ONE MONTH, THEN STOP   SIMVASTATIN (ZOCOR) 40 MG TABLET     TAKE ONE TABLET BY MOUTH DAILY IN THE EVENING   SPIRONOLACTONE-HYDROCHLOROTHIAZIDE (ALDACTAZIDE) 25-25 MG PER TABLET    Take 1/2 tablet by mouth daily   TDAP (BOOSTRIX) 5-2.5-18.5 LF-MCG/0.5 INJECTION    Inject 0.5 mLs into the muscle once.  Modified Medications   No medications on file  Discontinued Medications   ESTROGENS, CONJUGATED, (PREMARIN) 0.3 MG TABLET    Take one tablet every other day for one month, then stop.   SENNA (SENOKOT) 8.6 MG TABLET    Take 1 tablet by mouth as needed for constipation.     Physical Exam: Filed Vitals:   03/18/14 0959  BP: 140/78  Pulse: 81  Temp: 99.4 F (37.4 C)  TempSrc: Oral  Resp: 18  Height: 5' 5.78" (1.671 m)  Weight: 165 lb 3.2 oz (74.934 kg)  SpO2: 97%  Physical Exam  Constitutional: She is oriented to person, place, and time. She appears well-developed and well-nourished. No distress.  Cardiovascular: Normal rate, regular rhythm, normal heart sounds and intact distal pulses.   Pulmonary/Chest: Effort normal and breath sounds normal. No respiratory distress.  Abdominal: Soft. Bowel sounds are normal. She exhibits no distension and no mass. There is no tenderness.  Musculoskeletal: Normal range of motion. She exhibits no edema and no tenderness.  Neurological: She is alert and oriented to person, place, and time.  Skin: Skin is warm and dry.  Psychiatric: She has a normal mood and affect.    Labs reviewed: Basic Metabolic Panel:  Recent Labs  09/11/13 0829 03/17/14 0807  NA 139 136  K 4.0 3.9  CL 99 97  CO2 22 22  GLUCOSE 122* 131*  BUN 17 21  CREATININE 1.24* 1.38*  CALCIUM 10.2 10.6*  TSH  --  1.270   Liver Function Tests:  Recent Labs  03/17/14 0807  AST 17  ALT 14  ALKPHOS 71  BILITOT 0.3  PROT 6.8  CBC:  Recent Labs  03/17/14 0807  WBC 8.3  NEUTROABS 5.1  HGB 13.8  HCT 40.2  MCV 85   Lipid Panel:  Recent Labs  09/11/13 0829 03/17/14 0807  HDL 71 64  LDLCALC 84 88  TRIG 199* 186*  CHOLHDL  2.7 3.0   Lab Results  Component Value Date   HGBA1C 6.8* 01/26/2013    Assessment/Plan 1. Essential hypertension -bp at upper limits of normal -cont exercise, low sodium diet and losartan, cont to monitor - losartan (COZAAR) 50 MG tablet; TAKE 1 TABLET (50 MG TOTAL) BY MOUTH DAILY.  Dispense: 30 tablet; Refill: 5 - CBC With differential/Platelet; Future - Comprehensive metabolic panel; Future  2. Thyroid disease - cont synthroid - TSH; Future  3.  Disorder of bone and cartilage, unspecified -osteopenia -cont ca with D  4. Hyperlipidemia - ldl at goal, but tg still elevated-does not sound like she is eating an excess of starchy fats - Lipid panel; Future  5. Hyperglycemia - on labs fasting glucose has been elevated--will check hba1c - Hemoglobin A1c; Future  6. Vulvovaginal dryness -is adamant about continuing her premarin due to this and getting too wrinkled and getting hairs on her chin--aware of the side effects - estrogens, conjugated, (PREMARIN) 0.3 MG tablet; Take one tablet every other day for one month  Dispense: 15 tablet; Refill: 5 - CBC With differential/Platelet; Future  Labs/tests ordered:   Orders Placed This Encounter  Procedures  . Hemoglobin A1c    Standing Status: Future     Number of Occurrences:      Standing Expiration Date: 03/19/2015  . Lipid panel    Standing Status: Future     Number of Occurrences:      Standing Expiration Date: 03/19/2015    Order Specific Question:  Has the patient fasted?    Answer:  Yes  . CBC With differential/Platelet    Standing Status: Future     Number of Occurrences:      Standing Expiration Date: 03/19/2015  . Comprehensive metabolic panel    Standing Status: Future     Number of Occurrences:      Standing Expiration Date: 03/19/2015    Order Specific Question:  Has the patient fasted?    Answer:  Yes  . TSH    Standing Status: Future     Number of Occurrences:      Standing Expiration Date: 03/19/2015    Next  appt:  6 mos for annual exam, labs before

## 2014-04-12 ENCOUNTER — Encounter: Payer: Self-pay | Admitting: Internal Medicine

## 2014-04-12 ENCOUNTER — Ambulatory Visit (INDEPENDENT_AMBULATORY_CARE_PROVIDER_SITE_OTHER): Payer: Medicare Other | Admitting: Internal Medicine

## 2014-04-12 VITALS — BP 128/72 | HR 83 | Temp 98.2°F | Resp 18 | Ht 65.0 in | Wt 163.8 lb

## 2014-04-12 DIAGNOSIS — R1011 Right upper quadrant pain: Secondary | ICD-10-CM

## 2014-04-12 DIAGNOSIS — K5900 Constipation, unspecified: Secondary | ICD-10-CM

## 2014-04-12 DIAGNOSIS — K5909 Other constipation: Secondary | ICD-10-CM

## 2014-04-12 NOTE — Progress Notes (Signed)
Patient ID: Destiny Velasquez, female   DOB: Jan 12, 1926, 80 y.o.   MRN: [id number]   Location:  [facility] / [place]  No Known Allergies  Chief Complaint  Patient presents with  . Acute Visit    rt side pain x 3 days    HPI: Patient is a 80 y.o. white female seen in the office today for acute visit.  Friday morning had sharp, localized pain in right side.  As day wore on, it wasn't as sharp.  Felt exactly like when she had diverticulitis, but no fever this time.  Had some gas so thought that was it.  Her husband thought maybe it was muscle strain.  Still there.  Worse last night.  Had difficulty lying on right side or back.  Can say where it is, but can't reproduce it by pressing on it.  Worst when lying down, but sleeps through the night.  When sneezed, she thought something was seriously wrong.  Had prior chole and appe.  Thought it would go away.  No physically different things in terms of exercise.  Has her ongoing constipation.  Had a regular bm on Friday.  Saturday small bm.  Sometimes has to dig out and then has some blood there.  Drank more prune juice with miralax last night.    Review of Systems:  Review of Systems  Constitutional: Negative for fever, chills, weight loss and malaise/fatigue.  Eyes: Negative for blurred vision.  Respiratory: Negative for shortness of breath.   Cardiovascular: Negative for chest pain.  Gastrointestinal: Positive for abdominal pain and constipation. Negative for nausea, vomiting, diarrhea, blood in stool and melena.  Genitourinary: Negative for dysuria, urgency, frequency, hematuria and flank pain.  Musculoskeletal: Negative for falls and myalgias.  Skin: Negative for rash.  Neurological: Negative for dizziness, weakness and headaches.  Endo/Heme/Allergies: Bruises/bleeds easily.  Psychiatric/Behavioral: Positive for memory loss.    Past Medical History  Diagnosis Date  . Hypertension   . Hyperlipidemia   . Thyroid  disease   . Subacute delirium   . Essential and other specified forms of tremor   . Malignant neoplasm of breast (female), unspecified site   . Unspecified hypothyroidism   . Diverticulosis of colon (without mention of hemorrhage)   . Unspecified constipation   . Anal and rectal polyp   . Disorder of bone and cartilage, unspecified   . Memory loss     Past Surgical History  Procedure Laterality Date  . Mastectomy  1971    Right  . Cholecystectomy    . Appendectomy    . Abdominal hysterectomy      Social History:   reports that she has quit smoking. She does not have any smokeless tobacco history on file. She reports that she does not drink alcohol or use illicit drugs.  Family History  Problem Relation Age of Onset  . Cancer Mother   . Hypertension Father     Medications: Patient's Medications  New Prescriptions   No medications on file  Previous Medications   ASPIRIN 81 MG TABLET    Take 81 mg by mouth daily.   BETA CAROTENE W/MINERALS (OCUVITE) TABLET    Take 1 tablet by mouth daily.   CHOLECALCIFEROL (VITAMIN D) 2000 UNITS TABLET    Take 2,000 Units by mouth daily.   ESTROGENS, CONJUGATED, (PREMARIN) 0.3 MG TABLET    Take one tablet every other day for one month   LEVOTHYROXINE (SYNTHROID, LEVOTHROID) 100 MCG TABLET  TAKE 1 TABLET BY MOUTH EVERY MORNING ON AN EMPTY STOMACH.  SEPARATE FROM OTHER MEDS   LOSARTAN (COZAAR) 50 MG TABLET    TAKE 1 TABLET (50 MG TOTAL) BY MOUTH DAILY.   NIACIN 500 MG TABLET    Take 500 mg by mouth daily with breakfast.   PREMARIN 0.3 MG TABLET    TAKE ONE TABLET BY MOUTH EVERY OTHER DAY FOR ONE MONTH, THEN STOP   SIMVASTATIN (ZOCOR) 40 MG TABLET    TAKE ONE TABLET BY MOUTH DAILY IN THE EVENING   SPIRONOLACTONE-HYDROCHLOROTHIAZIDE (ALDACTAZIDE) 25-25 MG PER TABLET    Take 1/2 tablet by mouth daily  Modified Medications   No medications on file  Discontinued Medications   No medications on file     Physical Exam: Filed Vitals:    04/12/14 1203  BP: 128/72  Pulse: 83  Temp: 98.2 F (36.8 C)  TempSrc: Oral  Resp: 18  Height: 5\' 5"  (1.651 m)  Weight: 163 lb 12.8 oz (74.299 kg)  SpO2: 94%  Physical Exam  Constitutional: She is oriented to person, place, and time. She appears well-developed and well-nourished. No distress.  Cardiovascular: Normal rate, regular rhythm, normal heart sounds and intact distal pulses.   Pulmonary/Chest: Effort normal and breath sounds normal. No respiratory distress.  Abdominal: Soft. Bowel sounds are normal. She exhibits no distension, no abdominal bruit and no ascites. There is no tenderness. There is no rigidity and no guarding.  RUQ tenderness over rib only  Musculoskeletal: Normal range of motion.  Neurological: She is alert and oriented to person, place, and time.  Skin: Skin is warm and dry.  Psychiatric: She has a normal mood and affect.    Labs reviewed: Basic Metabolic Panel:  Recent Labs  09/11/13 0829 03/17/14 0807  NA 139 136  K 4.0 3.9  CL 99 97  CO2 22 22  GLUCOSE 122* 131*  BUN 17 21  CREATININE 1.24* 1.38*  CALCIUM 10.2 10.6*  TSH  --  1.270   Liver Function Tests:  Recent Labs  03/17/14 0807  AST 17  ALT 14  ALKPHOS 71  BILITOT 0.3  PROT 6.8  CBC:  Recent Labs  03/17/14 0807  WBC 8.3  NEUTROABS 5.1  HGB 13.8  HCT 40.2  MCV 85   Lipid Panel:  Recent Labs  09/11/13 0829 03/17/14 0807  HDL 71 64  LDLCALC 84 88  TRIG 199* 186*  CHOLHDL 2.7 3.0   Lab Results  Component Value Date   HGBA1C 6.8* 01/26/2013    Past Procedures: 03/15/11:  2 view abdominal xray:  1. Moderate to large colonic stool burden without evidence of obstruction.  2. Indeterminate 5-mm opacity overlies the expected location of the right kidney may represent a renal stone.  Assessment/Plan 1. Abdominal pain, right upper quadrant -mild tenderness over lower right ribs, none in actual abdomen on exam -suspect that this is either a nonobstructing kidney stone  vs. Constipation with constipation and flatus being most likely with her history -she is to let me know if she has increased pain, fever, change in bowel habits (aside from loose stool after taking bowel regimen) -if persists, would check CT abdomen/pelvis  2. Chronic constipation -advised to follow Dr. Osborn Coho prior advise and use the miralax prep he advised when she is very constipated and see if this is helpful -her daughter and husband were also present and agreed with this approach  Labs/tests ordered:  None at this time Next appt:  As scheduled

## 2014-04-15 DIAGNOSIS — K5909 Other constipation: Secondary | ICD-10-CM | POA: Insufficient documentation

## 2014-05-26 DIAGNOSIS — Z23 Encounter for immunization: Secondary | ICD-10-CM | POA: Diagnosis not present

## 2014-06-21 ENCOUNTER — Other Ambulatory Visit: Payer: Self-pay | Admitting: *Deleted

## 2014-06-21 MED ORDER — LEVOTHYROXINE SODIUM 100 MCG PO TABS: ORAL_TABLET | ORAL | Status: DC

## 2014-06-21 NOTE — Telephone Encounter (Signed)
Destiny Velasquez [name]

## 2014-08-02 ENCOUNTER — Other Ambulatory Visit: Payer: Self-pay | Admitting: Internal Medicine

## 2014-08-02 NOTE — Telephone Encounter (Signed)
Destiny Velasquez [name]

## 2014-08-13 DIAGNOSIS — C229 Malignant neoplasm of liver, not specified as primary or secondary: Secondary | ICD-10-CM

## 2014-08-13 HISTORY — DX: Malignant neoplasm of liver, not specified as primary or secondary: C22.9

## 2014-08-19 ENCOUNTER — Other Ambulatory Visit: Payer: Self-pay | Admitting: *Deleted

## 2014-08-19 MED ORDER — LEVOTHYROXINE SODIUM 100 MCG PO TABS: ORAL_TABLET | ORAL | Status: DC

## 2014-08-19 NOTE — Telephone Encounter (Signed)
Destiny Velasquez [name]

## 2014-08-20 ENCOUNTER — Other Ambulatory Visit: Payer: Self-pay | Admitting: Internal Medicine

## 2014-09-03 ENCOUNTER — Other Ambulatory Visit: Payer: Self-pay | Admitting: Internal Medicine

## 2014-09-20 ENCOUNTER — Other Ambulatory Visit: Payer: Medicare Other

## 2014-09-20 DIAGNOSIS — R739 Hyperglycemia, unspecified: Secondary | ICD-10-CM | POA: Diagnosis not present

## 2014-09-20 DIAGNOSIS — I10 Essential (primary) hypertension: Secondary | ICD-10-CM

## 2014-09-20 DIAGNOSIS — N898 Other specified noninflammatory disorders of vagina: Secondary | ICD-10-CM

## 2014-09-20 DIAGNOSIS — E785 Hyperlipidemia, unspecified: Secondary | ICD-10-CM

## 2014-09-20 DIAGNOSIS — N9489 Other specified conditions associated with female genital organs and menstrual cycle: Secondary | ICD-10-CM

## 2014-09-20 DIAGNOSIS — E079 Disorder of thyroid, unspecified: Secondary | ICD-10-CM | POA: Diagnosis not present

## 2014-09-20 DIAGNOSIS — R7309 Other abnormal glucose: Secondary | ICD-10-CM | POA: Diagnosis not present

## 2014-09-21 LAB — COMPREHENSIVE METABOLIC PANEL
ALT: 13 IU/L (ref 0–32)
AST: 16 IU/L (ref 0–40)
Albumin/Globulin Ratio: 1.4 (ref 1.1–2.5)
Albumin: 3.9 g/dL (ref 3.5–4.7)
Alkaline Phosphatase: 72 IU/L (ref 39–117)
BUN/Creatinine Ratio: 15 (ref 11–26)
BUN: 18 mg/dL (ref 8–27)
Bilirubin Total: 0.3 mg/dL (ref 0.0–1.2)
CO2: 23 mmol/L (ref 18–29)
Calcium: 10.8 mg/dL — ABNORMAL HIGH (ref 8.7–10.3)
Chloride: 100 mmol/L (ref 97–108)
Creatinine, Ser: 1.23 mg/dL — ABNORMAL HIGH (ref 0.57–1.00)
GFR calc Af Amer: 46 mL/min/{1.73_m2} — ABNORMAL LOW (ref >59)
GFR calc non Af Amer: 40 mL/min/{1.73_m2} — ABNORMAL LOW (ref >59)
Globulin, Total: 2.7 g/dL (ref 1.5–4.5)
Glucose: 132 mg/dL — ABNORMAL HIGH (ref 65–99)
Potassium: 4.2 mmol/L (ref 3.5–5.2)
Sodium: 139 mmol/L (ref 134–144)
Total Protein: 6.6 g/dL (ref 6.0–8.5)

## 2014-09-21 LAB — CBC WITH DIFFERENTIAL
Basophils Absolute: 0.1 10*3/uL (ref 0.0–0.2)
Basos: 1 %
Eos: 3 %
Eosinophils Absolute: 0.2 10*3/uL (ref 0.0–0.4)
HCT: 40.9 % (ref 34.0–46.6)
Hemoglobin: 13.6 g/dL (ref 11.1–15.9)
Immature Grans (Abs): 0 10*3/uL (ref 0.0–0.1)
Immature Granulocytes: 0 %
Lymphocytes Absolute: 1.5 10*3/uL (ref 0.7–3.1)
Lymphs: 17 %
MCH: 29.1 pg (ref 26.6–33.0)
MCHC: 33.3 g/dL (ref 31.5–35.7)
MCV: 87 fL (ref 79–97)
Monocytes Absolute: 0.6 10*3/uL (ref 0.1–0.9)
Monocytes: 7 %
Neutrophils Absolute: 6.1 10*3/uL (ref 1.4–7.0)
Neutrophils Relative %: 72 %
RBC: 4.68 x10E6/uL (ref 3.77–5.28)
RDW: 13.5 % (ref 12.3–15.4)
WBC: 8.5 10*3/uL (ref 3.4–10.8)

## 2014-09-21 LAB — LIPID PANEL
Chol/HDL Ratio: 2.4 ratio units (ref 0.0–4.4)
Cholesterol, Total: 174 mg/dL (ref 100–199)
HDL: 73 mg/dL (ref >39)
LDL Calculated: 77 mg/dL (ref 0–99)
Triglycerides: 118 mg/dL (ref 0–149)
VLDL Cholesterol Cal: 24 mg/dL (ref 5–40)

## 2014-09-21 LAB — TSH: TSH: 1.1 u[IU]/mL (ref 0.450–4.500)

## 2014-09-21 LAB — HEMOGLOBIN A1C
Est. average glucose Bld gHb Est-mCnc: 148 mg/dL
Hgb A1c MFr Bld: 6.8 % — ABNORMAL HIGH (ref 4.8–5.6)

## 2014-10-01 ENCOUNTER — Ambulatory Visit (INDEPENDENT_AMBULATORY_CARE_PROVIDER_SITE_OTHER): Payer: Medicare Other | Admitting: Internal Medicine

## 2014-10-01 ENCOUNTER — Encounter: Payer: Self-pay | Admitting: Internal Medicine

## 2014-10-01 VITALS — BP 130/78 | HR 78 | Temp 97.3°F | Resp 20 | Ht 65.0 in | Wt 161.6 lb

## 2014-10-01 DIAGNOSIS — E034 Atrophy of thyroid (acquired): Secondary | ICD-10-CM

## 2014-10-01 DIAGNOSIS — N9489 Other specified conditions associated with female genital organs and menstrual cycle: Secondary | ICD-10-CM

## 2014-10-01 DIAGNOSIS — N898 Other specified noninflammatory disorders of vagina: Secondary | ICD-10-CM

## 2014-10-01 DIAGNOSIS — K5900 Constipation, unspecified: Secondary | ICD-10-CM

## 2014-10-01 DIAGNOSIS — Z23 Encounter for immunization: Secondary | ICD-10-CM | POA: Diagnosis not present

## 2014-10-01 DIAGNOSIS — I10 Essential (primary) hypertension: Secondary | ICD-10-CM

## 2014-10-01 DIAGNOSIS — E038 Other specified hypothyroidism: Secondary | ICD-10-CM

## 2014-10-01 DIAGNOSIS — N9089 Other specified noninflammatory disorders of vulva and perineum: Secondary | ICD-10-CM

## 2014-10-01 DIAGNOSIS — E785 Hyperlipidemia, unspecified: Secondary | ICD-10-CM

## 2014-10-01 DIAGNOSIS — R739 Hyperglycemia, unspecified: Secondary | ICD-10-CM | POA: Diagnosis not present

## 2014-10-01 DIAGNOSIS — G729 Myopathy, unspecified: Secondary | ICD-10-CM | POA: Diagnosis not present

## 2014-10-01 DIAGNOSIS — K5909 Other constipation: Secondary | ICD-10-CM

## 2014-10-01 NOTE — Progress Notes (Signed)
Patient ID: Destiny Velasquez, female   DOB: 1926/06/16, 81 y.o.   MRN: [id number]   Location:  [facility]  Code Status: DNR  No Known Allergies  Chief Complaint  Patient presents with  . Annual Exam    HPI: Patient is a 81 y.o. female seen in the office today for her annual exam.    Noticed on her last bloodwork that I was worried about her sugar.  Had been constipated and was drinking prune juice each night.  Says she has some hemorrhoids and possibly small amount of rectocele when stools are hard.  When they are soft, they sometimes won't come out.  Hasn't seen Dr. [name] recently.    Hasn't had chest pain, heart attacks, strokes.  Article was in time that said she shouldn't have to take asa 81mg .    Has discomfort in ankles, feet, thighs, low back after she takes her simvastatin every night.  She is eating less food according to her husband.  Blames the simvastatin for her muscle pains.    Continues on premarin every other day.  Things get dried up when she quits it, and she does not want to stop it for that reason despite cancer and cardiac risks.  Walked less over winter.  Is doing exercise classes twice a week, but not much cardio with those.    Review of Systems:  Review of Systems  Constitutional: Negative for fever and chills.  HENT: Negative for congestion and hearing loss.   Eyes: Negative for blurred vision.  Respiratory: Negative for shortness of breath.   Cardiovascular: Negative for chest pain.  Gastrointestinal: Positive for constipation. Negative for abdominal pain, blood in stool and melena.  Genitourinary: Negative for dysuria.  Musculoskeletal: Positive for myalgias and joint pain. Negative for falls.  Skin: Negative for rash.  Neurological: Negative for dizziness.  Endo/Heme/Allergies:       Thyroid disease, hyperglycemia  Psychiatric/Behavioral: Positive for memory loss. Negative for depression. The patient does not  have insomnia.     Past Medical History  Diagnosis Date  . Hypertension   . Hyperlipidemia   . Thyroid disease   . Subacute delirium   . Essential and other specified forms of tremor   . Malignant neoplasm of breast (female), unspecified site   . Unspecified hypothyroidism   . Diverticulosis of colon (without mention of hemorrhage)   . Unspecified constipation   . Anal and rectal polyp   . Disorder of bone and cartilage, unspecified   . Memory loss     Past Surgical History  Procedure Laterality Date  . Mastectomy  1971    Right  . Cholecystectomy    . Appendectomy    . Abdominal hysterectomy      Social History:   reports that she has quit smoking. She does not have any smokeless tobacco history on file. She reports that she does not drink alcohol or use illicit drugs.  Family History  Problem Relation Age of Onset  . Cancer Mother   . Hypertension Father     Medications: Patient's Medications  New Prescriptions   No medications on file  Previous Medications   ASPIRIN 81 MG TABLET    Take 81 mg by mouth daily.   BETA CAROTENE W/MINERALS (OCUVITE) TABLET    Take 1 tablet by mouth daily.   CHOLECALCIFEROL (VITAMIN D) 2000 UNITS TABLET    Take 2,000 Units by mouth daily.   ESTROGENS, CONJUGATED, (PREMARIN) 0.3 MG  TABLET    Take one tablet every other day for one month   LEVOTHYROXINE (SYNTHROID, LEVOTHROID) 100 MCG TABLET    Take one tablet by mouth 30 minutes before breakfast every morning separate from other medications for thyroid   LOSARTAN (COZAAR) 50 MG TABLET    TAKE 1 TABLET(S) BY MOUTH DAILY   NIACIN 500 MG TABLET    Take 500 mg by mouth daily with breakfast.   PREMARIN 0.3 MG TABLET    TAKE 1 TABLET(S) BY MOUTH EVERY OTHER DAY   SIMVASTATIN (ZOCOR) 40 MG TABLET    Take one tablet by mouth once daily in the evening for cholesterol   SPIRONOLACTONE-HYDROCHLOROTHIAZIDE (ALDACTAZIDE) 25-25 MG PER TABLET    Take 1/2 tablet by mouth daily  Modified Medications    No medications on file  Discontinued Medications   No medications on file   Physical Exam: Filed Vitals:   10/01/14 0851  BP: 130/78  Pulse: 78  Temp: 97.3 F (36.3 C)  TempSrc: Oral  Resp: 20  Height: 5\' 5"  (1.651 m)  Weight: 161 lb 9.6 oz (73.301 kg)  SpO2: 96%  Physical Exam  Constitutional: She is oriented to person, place, and time. She appears well-developed and well-nourished. No distress.  HENT:  Head: Normocephalic and atraumatic.  Right Ear: External ear normal.  Left Ear: External ear normal.  Nose: Nose normal.  Mouth/Throat: Oropharynx is clear and moist. No oropharyngeal exudate.  TMs pink, no cerumen present  Eyes: Conjunctivae and EOM are normal. Pupils are equal, round, and reactive to light.  Neck: Normal range of motion. Neck supple. No JVD present. No tracheal deviation present. No thyromegaly present.  Cardiovascular: Normal rate, regular rhythm, normal heart sounds and intact distal pulses.   Pulmonary/Chest: Effort normal and breath sounds normal. No respiratory distress. Left breast exhibits no inverted nipple, no mass, no nipple discharge, no skin change and no tenderness.  Right mastectomy with diagonal scar; axilla with balled up muscle/tissue, nontender  Abdominal: Soft. Bowel sounds are normal. She exhibits no distension and no mass. There is no tenderness.  Musculoskeletal: Normal range of motion. She exhibits no edema or tenderness.  Lymphadenopathy:    She has no cervical adenopathy.  Neurological: She is alert and oriented to person, place, and time. She has normal reflexes.  Skin: Skin is warm and dry.  Psychiatric: She has a normal mood and affect.    Labs reviewed: Basic Metabolic Panel:  Recent Labs  03/17/14 0807 09/20/14 0816  NA 136 139  K 3.9 4.2  CL 97 100  CO2 22 23  GLUCOSE 131* 132*  BUN 21 18  CREATININE 1.38* 1.23*  CALCIUM 10.6* 10.8*  TSH 1.270 1.100   Liver Function Tests:  Recent Labs  03/17/14 0807  09/20/14 0816  AST 17 16  ALT 14 13  ALKPHOS 71 72  BILITOT 0.3 0.3  PROT 6.8 6.6   No results for input(s): LIPASE, AMYLASE in the last 8760 hours. No results for input(s): AMMONIA in the last 8760 hours. CBC:  Recent Labs  03/17/14 0807 09/20/14 0816  WBC 8.3 8.5  NEUTROABS 5.1 6.1  HGB 13.8 13.6  HCT 40.2 40.9  MCV 85 87   Lipid Panel:  Recent Labs  03/17/14 0807 09/20/14 0816  HDL 64 73  LDLCALC 88 77  TRIG 186* 118  CHOLHDL 3.0 2.4   Lab Results  Component Value Date   HGBA1C 6.8* 09/20/2014    Past Procedures: EKG today:  NSR  at 72bpm  Assessment/Plan 1. Essential hypertension -bp at goal, no changes  2. Hyperlipidemia -TG finally at goal, and now she wants to stop her zocor due to am only myalgias (I have felt that this was arthritis and in her joints, but she says otherwise)--she has changed her diet due to restrictions from her husband's illnesses  3. Hyperglycemia - discussed dietary changes, she has made some anyway -has metabolic syndrome picture -advised to cont the asa and her cozaar, but wants to stop her statin  4. Vulvovaginal dryness -insists on continuing premarin every other day due to this -advised to stop and use topical estrogen, but she does not want to   5. Hypothyroidism due to acquired atrophy of thyroid -TSH was finally normal -cont synthroid 123mcg before breakfast  6. Myopathy -will stop statin and recheck lipids before next visit in 6 mos due to her excellent functional status  7. Chronic constipation -prune juice helps, stool softener occasionally and suppositories for hemorrhoids  8. Need for vaccination with 13-polyvalent pneumococcal conjugate vaccine -agreed after great encouragement to get her prevnar  Prevention:  Refuses any more mammograms--last 09/14/13 Last cscope 12/13 no more needed Got prevnar, previously had pneumovax, got flu shot in fall, refuses tdap booster Not depressed Memory --some mild  cognitive impairment Functional status normal No falls  Labs/tests ordered: Orders Placed This Encounter  Procedures  . Hemoglobin A1c    Standing Status: Future     Number of Occurrences:      Standing Expiration Date: 10/02/2015  . Lipid panel    Standing Status: Future     Number of Occurrences:      Standing Expiration Date: 10/02/2015    Order Specific Question:  Has the patient fasted?    Answer:  Yes  . TSH    Standing Status: Future     Number of Occurrences:      Standing Expiration Date: 10/02/2015  . Basic metabolic panel    Standing Status: Future     Number of Occurrences:      Standing Expiration Date: 10/02/2015    Order Specific Question:  Has the patient fasted?    Answer:  Yes    Next appt:  6 mos  Davida Falconi L. Demarian Epps, D.O. [facility] [address], [place] [address] Cell Phone (Mon-Fri 8am-5pm):  [phone] On Call:  [phone] & follow prompts after 5pm & weekends Office Phone:  [phone] Office Fax:  [phone]

## 2014-10-01 NOTE — Patient Instructions (Addendum)
Stop your simvastatin and see if your morning muscle pains get better Continue your cozaar and your baby aspirin  You received your Prevnar pneumonia vaccine today  I recommend you stop your premarin and use a vaginal moisturizer or local estrogen cream for vaginal dryness.

## 2014-10-06 ENCOUNTER — Other Ambulatory Visit: Payer: Self-pay | Admitting: Internal Medicine

## 2014-10-07 ENCOUNTER — Encounter: Payer: Self-pay | Admitting: Internal Medicine

## 2014-10-20 ENCOUNTER — Other Ambulatory Visit: Payer: Self-pay | Admitting: Internal Medicine

## 2014-12-18 ENCOUNTER — Other Ambulatory Visit: Payer: Self-pay | Admitting: Internal Medicine

## 2015-01-17 ENCOUNTER — Other Ambulatory Visit: Payer: Self-pay | Admitting: *Deleted

## 2015-01-17 MED ORDER — SPIRONOLACTONE-HCTZ 25-25 MG PO TABS: ORAL_TABLET | ORAL | Status: DC

## 2015-01-17 NOTE — Telephone Encounter (Signed)
[employer] [name] 

## 2015-02-28 ENCOUNTER — Other Ambulatory Visit: Payer: Self-pay | Admitting: *Deleted

## 2015-02-28 MED ORDER — LOSARTAN POTASSIUM 50 MG PO TABS: ORAL_TABLET | ORAL | Status: DC

## 2015-02-28 NOTE — Telephone Encounter (Signed)
[employer] [name] 

## 2015-03-29 ENCOUNTER — Other Ambulatory Visit (HOSPITAL_COMMUNITY): Payer: Medicare Other

## 2015-03-30 ENCOUNTER — Other Ambulatory Visit: Payer: Medicare Other

## 2015-03-30 DIAGNOSIS — R739 Hyperglycemia, unspecified: Secondary | ICD-10-CM

## 2015-03-30 DIAGNOSIS — E034 Atrophy of thyroid (acquired): Secondary | ICD-10-CM

## 2015-03-30 DIAGNOSIS — I10 Essential (primary) hypertension: Secondary | ICD-10-CM | POA: Diagnosis not present

## 2015-03-30 DIAGNOSIS — E038 Other specified hypothyroidism: Secondary | ICD-10-CM | POA: Diagnosis not present

## 2015-03-30 DIAGNOSIS — E785 Hyperlipidemia, unspecified: Secondary | ICD-10-CM | POA: Diagnosis not present

## 2015-03-31 LAB — BASIC METABOLIC PANEL
BUN/Creatinine Ratio: 15 (ref 11–26)
BUN: 18 mg/dL (ref 8–27)
CO2: 22 mmol/L (ref 18–29)
Calcium: 9.8 mg/dL (ref 8.7–10.3)
Chloride: 98 mmol/L (ref 97–108)
Creatinine, Ser: 1.24 mg/dL — ABNORMAL HIGH (ref 0.57–1.00)
GFR calc Af Amer: 45 mL/min/{1.73_m2} — ABNORMAL LOW (ref >59)
GFR calc non Af Amer: 39 mL/min/{1.73_m2} — ABNORMAL LOW (ref >59)
Glucose: 119 mg/dL — ABNORMAL HIGH (ref 65–99)
Potassium: 3.9 mmol/L (ref 3.5–5.2)
Sodium: 139 mmol/L (ref 134–144)

## 2015-03-31 LAB — LIPID PANEL
Chol/HDL Ratio: 3.5 ratio units (ref 0.0–4.4)
Cholesterol, Total: 264 mg/dL — ABNORMAL HIGH (ref 100–199)
HDL: 76 mg/dL (ref >39)
LDL Calculated: 154 mg/dL — ABNORMAL HIGH (ref 0–99)
Triglycerides: 171 mg/dL — ABNORMAL HIGH (ref 0–149)
VLDL Cholesterol Cal: 34 mg/dL (ref 5–40)

## 2015-03-31 LAB — TSH: TSH: 1.64 u[IU]/mL (ref 0.450–4.500)

## 2015-03-31 LAB — HEMOGLOBIN A1C
Est. average glucose Bld gHb Est-mCnc: 143 mg/dL
Hgb A1c MFr Bld: 6.6 % — ABNORMAL HIGH (ref 4.8–5.6)

## 2015-04-04 ENCOUNTER — Ambulatory Visit: Payer: Medicare Other | Admitting: Internal Medicine

## 2015-04-04 ENCOUNTER — Telehealth: Payer: Self-pay | Admitting: *Deleted

## 2015-04-04 ENCOUNTER — Encounter: Payer: Self-pay | Admitting: Internal Medicine

## 2015-04-04 DIAGNOSIS — Z0289 Encounter for other administrative examinations: Secondary | ICD-10-CM

## 2015-04-04 NOTE — Telephone Encounter (Signed)
Spoke with patient regarding her lab results, she didn't show for her appointment she had company so she forgot about her appointment. The patient rescheduled her appointment for 04/14/15 '@10'$ :30am. The patient stated that he right leg pain was better, and she wanted to know if she should restart her medication. I ask Dr. Mariea Clonts whether she wanted her to restart the simvastatin, she stated that she complained of this making her legs hurt. So the decision of what medication to put her on will be determined on her next visit.

## 2015-04-11 ENCOUNTER — Other Ambulatory Visit: Payer: Self-pay | Admitting: Internal Medicine

## 2015-04-14 ENCOUNTER — Encounter: Payer: Self-pay | Admitting: Internal Medicine

## 2015-04-14 ENCOUNTER — Ambulatory Visit (INDEPENDENT_AMBULATORY_CARE_PROVIDER_SITE_OTHER): Payer: Medicare Other | Admitting: Internal Medicine

## 2015-04-14 VITALS — BP 122/82 | HR 69 | Temp 98.1°F | Resp 20 | Ht 65.0 in | Wt 154.8 lb

## 2015-04-14 DIAGNOSIS — Z889 Allergy status to unspecified drugs, medicaments and biological substances status: Secondary | ICD-10-CM

## 2015-04-14 DIAGNOSIS — K5900 Constipation, unspecified: Secondary | ICD-10-CM | POA: Diagnosis not present

## 2015-04-14 DIAGNOSIS — N9489 Other specified conditions associated with female genital organs and menstrual cycle: Secondary | ICD-10-CM

## 2015-04-14 DIAGNOSIS — E034 Atrophy of thyroid (acquired): Secondary | ICD-10-CM

## 2015-04-14 DIAGNOSIS — E785 Hyperlipidemia, unspecified: Secondary | ICD-10-CM

## 2015-04-14 DIAGNOSIS — K5909 Other constipation: Secondary | ICD-10-CM

## 2015-04-14 DIAGNOSIS — Z789 Other specified health status: Secondary | ICD-10-CM

## 2015-04-14 DIAGNOSIS — E119 Type 2 diabetes mellitus without complications: Secondary | ICD-10-CM

## 2015-04-14 DIAGNOSIS — I10 Essential (primary) hypertension: Secondary | ICD-10-CM | POA: Diagnosis not present

## 2015-04-14 DIAGNOSIS — E038 Other specified hypothyroidism: Secondary | ICD-10-CM

## 2015-04-14 DIAGNOSIS — N9089 Other specified noninflammatory disorders of vulva and perineum: Secondary | ICD-10-CM

## 2015-04-14 DIAGNOSIS — N898 Other specified noninflammatory disorders of vagina: Secondary | ICD-10-CM

## 2015-04-14 MED ORDER — SIMVASTATIN 40 MG PO TABS: 40.0000 mg | ORAL_TABLET | ORAL | Status: DC

## 2015-04-14 NOTE — Patient Instructions (Signed)
Please take simvastatin '40mg'$  every other day (when you take the premarin) so you can remember.

## 2015-04-14 NOTE — Progress Notes (Signed)
Patient ID: Destiny Velasquez, female   DOB: 08-04-1926, 81 y.o.   MRN: [id number]   Location:  [facility]  Code Status: DNR Goals of Care: Advanced Directive information Does patient have an advance directive?: Yes   Chief Complaint  Patient presents with  . Medical Management of Chronic Issues    6 month follow-up, Hypertension    HPI: Patient is a 81 y.o. white female seen in the office today for med mgt of her chronic diseases.  She missed her appt last week when her sister was in town and had gotten ill.  Notes she has lost weight.  Doesn't think her diet really changed.  Her husband continues to eat only 1/3 of his meals out and they sometimes share meals.  Noted that she's lost about 20 lbs over the past 3 years.  Has been gradual. Her husband no longer drives so she gets to do that.  She does not like driving.    Has a pattern to manage her chronic constipation.  Felt like muscle cramps improved off statins and does not want to go back on them.  Still taking niacin.   Gets calf cramps when wakes up sometimes.  If she has a lot of them, she drinks tonic water which helps her.    BP at goal with aldactazide and cozaar here today.   Rarely takes her own bp once every 2-3 weeks and says it's been less than 140/90.  Used to take lasix to treat high bps.  Really not using that now.    Hba1c down to 6.6 from 6.8.  Doesn't want to cut out sugar.    Lipids have gone up.  After extensive discussion and previous several phone calls back and forth with CMAs, she agrees to every other day simvastatin and will monitor for worsening of "cramps" on the days when she's had her medication.  Review of Systems:  Review of Systems  Constitutional: Positive for weight loss. Negative for fever, chills, malaise/fatigue and diaphoresis.  HENT: Negative for congestion and hearing loss.   Eyes: Negative for blurred vision.  Respiratory: Negative for cough and  shortness of breath.   Cardiovascular: Positive for leg swelling. Negative for chest pain.  Gastrointestinal: Positive for constipation. Negative for abdominal pain, blood in stool and melena.  Genitourinary: Negative for dysuria, urgency and frequency.  Musculoskeletal: Positive for myalgias and joint pain. Negative for falls.  Skin: Negative for itching and rash.  Neurological: Negative for dizziness, loss of consciousness, weakness and headaches.  Endo/Heme/Allergies: Bruises/bleeds easily.  Psychiatric/Behavioral: Positive for memory loss. Negative for depression. The patient is not nervous/anxious and does not have insomnia.     Past Medical History  Diagnosis Date  . Hypertension   . Hyperlipidemia   . Thyroid disease   . Subacute delirium   . Essential and other specified forms of tremor   . Malignant neoplasm of breast (female), unspecified site   . Unspecified hypothyroidism   . Diverticulosis of colon (without mention of hemorrhage)   . Unspecified constipation   . Anal and rectal polyp   . Disorder of bone and cartilage, unspecified   . Memory loss     Past Surgical History  Procedure Laterality Date  . Mastectomy  1971    Right  . Cholecystectomy    . Appendectomy    . Abdominal hysterectomy      No Known Allergies Medications: Patient's Medications  New Prescriptions   No  medications on file  Previous Medications   ASPIRIN 81 MG TABLET    Take 81 mg by mouth daily.   BETA CAROTENE W/MINERALS (OCUVITE) TABLET    Take 1 tablet by mouth daily.   CHOLECALCIFEROL (VITAMIN D) 2000 UNITS TABLET    Take 2,000 Units by mouth daily.   ESTROGENS, CONJUGATED, (PREMARIN) 0.3 MG TABLET    Take one tablet every other day for one month   LEVOTHYROXINE (SYNTHROID, LEVOTHROID) 100 MCG TABLET    TAKE ONE TABLET BY MOUTH 30 MINUTES BEFORE BREAKFAST EVERY MORNING. SEPARATE FROM OTHER MEDICATIONS FOR THYROID.   LOSARTAN (COZAAR) 50 MG TABLET    Take one tablet by mouth once  daily for blood pressure   NIACIN 500 MG TABLET    Take 500 mg by mouth daily with breakfast.   PREMARIN 0.3 MG TABLET    TAKE 1 TABLET(S) BY MOUTH EVERY OTHER DAY   SPIRONOLACTONE-HYDROCHLOROTHIAZIDE (ALDACTAZIDE) 25-25 MG PER TABLET    TAKE 1/2 TABLET BY MOUTH ONCE DAILY  Modified Medications   No medications on file  Discontinued Medications   No medications on file    Physical Exam: Filed Vitals:   04/14/15 1040  BP: 122/82  Pulse: 69  Temp: 98.1 F (36.7 C)  TempSrc: Oral  Resp: 20  Height: '5\' 5"'$  (1.651 m)  Weight: 154 lb 12.8 oz (70.217 kg)  SpO2: 97%   Physical Exam  Constitutional: She is oriented to person, place, and time. She appears well-developed and well-nourished. No distress.  Cardiovascular: Normal rate, regular rhythm, normal heart sounds and intact distal pulses.   Pulmonary/Chest: Effort normal and breath sounds normal. No respiratory distress.  Abdominal: Soft. Bowel sounds are normal. She exhibits no distension. There is no tenderness.  Musculoskeletal: Normal range of motion. She exhibits no tenderness.  Neurological: She is alert and oriented to person, place, and time.  Skin: Skin is warm and dry.  Psychiatric: She has a normal mood and affect.    Labs reviewed: Basic Metabolic Panel:  Recent Labs  09/20/14 0816 03/30/15 0813  NA 139 139  K 4.2 3.9  CL 100 98  CO2 23 22  GLUCOSE 132* 119*  BUN 18 18  CREATININE 1.23* 1.24*  CALCIUM 10.8* 9.8  TSH 1.100 1.640   Liver Function Tests:  Recent Labs  09/20/14 0816  AST 16  ALT 13  ALKPHOS 72  BILITOT 0.3  PROT 6.6   No results for input(s): LIPASE, AMYLASE in the last 8760 hours. No results for input(s): AMMONIA in the last 8760 hours. CBC:  Recent Labs  09/20/14 0816  WBC 8.5  NEUTROABS 6.1  HGB 13.6  HCT 40.9  MCV 87   Lipid Panel:  Recent Labs  09/20/14 0816 03/30/15 0813  CHOL 174 264*  HDL 73 76  LDLCALC 77 154*  TRIG 118 171*  CHOLHDL 2.4 3.5   Lab  Results  Component Value Date   HGBA1C 6.6* 03/30/2015   Assessment/Plan 1. Hyperlipidemia - will try every other day statin therapy due to elevated LDL -she will take in am with her premarin so she remembers - simvastatin (ZOCOR) 40 MG tablet; Take 1 tablet (40 mg total) by mouth every other day.  Dispense: 15 tablet; Refill: 3 - Lipid panel; Future  2. Diabetes mellitus type II, controlled - hba1c last couple of checks has reached diabetic range -she is aware that she should reduce her intake of sweets and starchy foods, but she opts not to do so  b/c that is one of her pleasures in life -will continue to monitor and continue regular exercise program - Hemoglobin A1c; Future  3. Essential hypertension - cont current regimen with benefit including cozaar and aldactazide - CBC with Differential/Platelet; Future - Comprehensive metabolic panel; Future  4. Hypothyroidism due to acquired atrophy of thyroid - cont synthroid 151mg daily on empty stomach first thing in am separate from other meds - TSH; Future  5. Chronic constipation -cont current bowel regimen  6. Statin intolerance - will try every other day dosing -unclear that her symptoms are truly related to statins--my suspicion is that it's her arthritis and am joint stiffness -she still has cramps despite being off zocor for more than a month - simvastatin (ZOCOR) 40 MG tablet; Take 1 tablet (40 mg total) by mouth every other day.  Dispense: 15 tablet; Refill: 3 - Comprehensive metabolic panel; Future  7. Vulvovaginal dryness -insurance wouldn't cover her premarin and i've advised against it for her -she is currently taking 1/2 of 0.'6mg'$  pills she had from a long time ago b/c she does not want to pay outright for it  Labs/tests ordered: Orders Placed This Encounter  Procedures  . CBC with Differential/Platelet    Standing Status: Future     Number of Occurrences:      Standing Expiration Date: 04/13/2016  .  Comprehensive metabolic panel    Standing Status: Future     Number of Occurrences:      Standing Expiration Date: 04/13/2016    Order Specific Question:  Has the patient fasted?    Answer:  Yes  . Hemoglobin A1c    Standing Status: Future     Number of Occurrences:      Standing Expiration Date: 04/13/2016  . Lipid panel    Standing Status: Future     Number of Occurrences:      Standing Expiration Date: 04/13/2016    Order Specific Question:  Has the patient fasted?    Answer:  Yes  . TSH    Standing Status: Future     Number of Occurrences:      Standing Expiration Date: 04/13/2016    Next appt:  6 mos for annual exam with labs before  [place], D.O. [facility] [address] [place] [id number] Phone (Mon-Fri 8am-5pm):  [phone] Call:  [phone]& follow prompts after 5pm & weekends Office Phone:  [phone] Fax:  [phone]

## 2015-05-03 ENCOUNTER — Inpatient Hospital Stay (HOSPITAL_BASED_OUTPATIENT_CLINIC_OR_DEPARTMENT_OTHER)
Admission: EM | Admit: 2015-05-03 | Discharge: 2015-05-06 | DRG: 391 | Disposition: A | Discharge status: Home / Self Care | Payer: Medicare Other | Attending: Internal Medicine | Admitting: Internal Medicine

## 2015-05-03 ENCOUNTER — Encounter (HOSPITAL_BASED_OUTPATIENT_CLINIC_OR_DEPARTMENT_OTHER): Payer: Self-pay | Admitting: Emergency Medicine

## 2015-05-03 ENCOUNTER — Emergency Department (HOSPITAL_BASED_OUTPATIENT_CLINIC_OR_DEPARTMENT_OTHER): Payer: Medicare Other

## 2015-05-03 DIAGNOSIS — E039 Hypothyroidism, unspecified: Secondary | ICD-10-CM | POA: Diagnosis not present

## 2015-05-03 DIAGNOSIS — K769 Liver disease, unspecified: Secondary | ICD-10-CM | POA: Diagnosis present

## 2015-05-03 DIAGNOSIS — K5900 Constipation, unspecified: Secondary | ICD-10-CM | POA: Diagnosis present

## 2015-05-03 DIAGNOSIS — R251 Tremor, unspecified: Secondary | ICD-10-CM | POA: Diagnosis present

## 2015-05-03 DIAGNOSIS — C229 Malignant neoplasm of liver, not specified as primary or secondary: Secondary | ICD-10-CM | POA: Diagnosis not present

## 2015-05-03 DIAGNOSIS — K5732 Diverticulitis of large intestine without perforation or abscess without bleeding: Secondary | ICD-10-CM

## 2015-05-03 DIAGNOSIS — Z87891 Personal history of nicotine dependence: Secondary | ICD-10-CM

## 2015-05-03 DIAGNOSIS — Z8249 Family history of ischemic heart disease and other diseases of the circulatory system: Secondary | ICD-10-CM | POA: Diagnosis not present

## 2015-05-03 DIAGNOSIS — R160 Hepatomegaly, not elsewhere classified: Secondary | ICD-10-CM

## 2015-05-03 DIAGNOSIS — E785 Hyperlipidemia, unspecified: Secondary | ICD-10-CM | POA: Diagnosis not present

## 2015-05-03 DIAGNOSIS — Z853 Personal history of malignant neoplasm of breast: Secondary | ICD-10-CM

## 2015-05-03 DIAGNOSIS — I10 Essential (primary) hypertension: Secondary | ICD-10-CM | POA: Diagnosis not present

## 2015-05-03 DIAGNOSIS — K750 Abscess of liver: Secondary | ICD-10-CM | POA: Diagnosis present

## 2015-05-03 DIAGNOSIS — N183 Chronic kidney disease, stage 3 (moderate): Secondary | ICD-10-CM | POA: Diagnosis present

## 2015-05-03 DIAGNOSIS — I129 Hypertensive chronic kidney disease with stage 1 through stage 4 chronic kidney disease, or unspecified chronic kidney disease: Secondary | ICD-10-CM | POA: Diagnosis present

## 2015-05-03 DIAGNOSIS — Z809 Family history of malignant neoplasm, unspecified: Secondary | ICD-10-CM

## 2015-05-03 DIAGNOSIS — R109 Unspecified abdominal pain: Secondary | ICD-10-CM | POA: Diagnosis not present

## 2015-05-03 DIAGNOSIS — K5792 Diverticulitis of intestine, part unspecified, without perforation or abscess without bleeding: Secondary | ICD-10-CM | POA: Diagnosis not present

## 2015-05-03 DIAGNOSIS — R634 Abnormal weight loss: Secondary | ICD-10-CM | POA: Diagnosis present

## 2015-05-03 DIAGNOSIS — E782 Mixed hyperlipidemia: Secondary | ICD-10-CM | POA: Diagnosis present

## 2015-05-03 DIAGNOSIS — R739 Hyperglycemia, unspecified: Secondary | ICD-10-CM | POA: Diagnosis not present

## 2015-05-03 HISTORY — DX: Malignant neoplasm of unspecified site of right female breast: C50.911

## 2015-05-03 HISTORY — DX: Pneumonia, unspecified organism: J18.9

## 2015-05-03 LAB — URINALYSIS, ROUTINE W REFLEX MICROSCOPIC
Bilirubin Urine: NEGATIVE
GLUCOSE, UA: NEGATIVE mg/dL
Hgb urine dipstick: NEGATIVE
KETONES UR: NEGATIVE mg/dL
LEUKOCYTES UA: NEGATIVE
Nitrite: NEGATIVE
PH: 7 (ref 5.0–8.0)
Protein, ur: NEGATIVE mg/dL
Specific Gravity, Urine: 1.012 (ref 1.005–1.030)
Urobilinogen, UA: 1 mg/dL (ref 0.0–1.0)

## 2015-05-03 LAB — COMPREHENSIVE METABOLIC PANEL
ALT: 19 U/L (ref 14–54)
AST: 27 U/L (ref 15–41)
Albumin: 3.6 g/dL (ref 3.5–5.0)
Alkaline Phosphatase: 68 U/L (ref 38–126)
Anion gap: 11 (ref 5–15)
BILIRUBIN TOTAL: 0.7 mg/dL (ref 0.3–1.2)
BUN: 19 mg/dL (ref 6–20)
CALCIUM: 10 mg/dL (ref 8.9–10.3)
CO2: 26 mmol/L (ref 22–32)
CREATININE: 1.17 mg/dL — AB (ref 0.44–1.00)
Chloride: 98 mmol/L — ABNORMAL LOW (ref 101–111)
GFR calc Af Amer: 47 mL/min — ABNORMAL LOW (ref >60)
GFR, EST NON AFRICAN AMERICAN: 41 mL/min — AB (ref >60)
Glucose, Bld: 137 mg/dL — ABNORMAL HIGH (ref 65–99)
POTASSIUM: 3.6 mmol/L (ref 3.5–5.1)
Sodium: 135 mmol/L (ref 135–145)
TOTAL PROTEIN: 7.4 g/dL (ref 6.5–8.1)

## 2015-05-03 LAB — CBC
HCT: 41.7 % (ref 36.0–46.0)
Hemoglobin: 14 g/dL (ref 12.0–15.0)
MCH: 28.6 pg (ref 26.0–34.0)
MCHC: 33.6 g/dL (ref 30.0–36.0)
MCV: 85.3 fL (ref 78.0–100.0)
PLATELETS: 282 10*3/uL (ref 150–400)
RBC: 4.89 MIL/uL (ref 3.87–5.11)
RDW: 12.6 % (ref 11.5–15.5)
WBC: 15 10*3/uL — AB (ref 4.0–10.5)

## 2015-05-03 MED ORDER — IOHEXOL 300 MG/ML  SOLN
50.0000 mL | Freq: Once | INTRAMUSCULAR | Status: AC | PRN
  Administered 2015-05-03: 50 mL via ORAL

## 2015-05-03 MED ORDER — MORPHINE SULFATE (PF) 2 MG/ML IV SOLN
1.0000 mg | Freq: Once | INTRAVENOUS | Status: AC
  Administered 2015-05-03: 1 mg via INTRAVENOUS
  Filled 2015-05-03: qty 1

## 2015-05-03 MED ORDER — METRONIDAZOLE IN NACL 5-0.79 MG/ML-% IV SOLN
500.0000 mg | Freq: Once | INTRAVENOUS | Status: AC
  Administered 2015-05-03: 500 mg via INTRAVENOUS
  Filled 2015-05-03: qty 100

## 2015-05-03 MED ORDER — IOHEXOL 300 MG/ML  SOLN
75.0000 mL | Freq: Once | INTRAMUSCULAR | Status: AC | PRN
  Administered 2015-05-03: 75 mL via INTRAVENOUS

## 2015-05-03 MED ORDER — CIPROFLOXACIN IN D5W 400 MG/200ML IV SOLN
400.0000 mg | Freq: Once | INTRAVENOUS | Status: AC
  Administered 2015-05-04: 400 mg via INTRAVENOUS
  Filled 2015-05-03 (×2): qty 200

## 2015-05-03 NOTE — ED Provider Notes (Addendum)
CSN: [id number]     Arrival date & time 05/03/15  1626 History   First MD Initiated Contact with Patient 05/03/15 1733     Chief Complaint  Patient presents with  . Abdominal Cramping     (Consider location/radiation/quality/duration/timing/severity/associated sxs/prior Treatment) HPI Complains of lower abdominal pain onset 10 days ago. Pain started right lower quadrant has since moved to the left side of her abdomen. Nothing makes symptoms better or worse. She has felt constipated. She treated herself withsmooth move laxity of tea this morning which resulted in severe abdominal cramping. Presently cramping is mild, and at lower abdomen. Last bowel movement 3 or 4 days ago. She is also treated self with an enema which resulted in slight amount of liquid stool. She feels mildly constipated at present. Denies nausea or vomiting denies fever no other associated symptoms. Nothing makes symptoms better or worse Past Medical History  Diagnosis Date  . Hypertension   . Hyperlipidemia   . Thyroid disease   . Subacute delirium   . Essential and other specified forms of tremor   . Malignant neoplasm of breast (female), unspecified site   . Unspecified hypothyroidism   . Diverticulosis of colon (without mention of hemorrhage)   . Unspecified constipation   . Anal and rectal polyp   . Disorder of bone and cartilage, unspecified   . Memory loss    Past Surgical History  Procedure Laterality Date  . Mastectomy  1971    Right  . Cholecystectomy    . Appendectomy    . Abdominal hysterectomy     Family History  Problem Relation Age of Onset  . Cancer Mother   . Hypertension Father    Social History  Substance Use Topics  . Smoking status: Former Research scientist (life sciences)  . Smokeless tobacco: None  . Alcohol Use: No   OB History    No data available     Review of Systems  Constitutional: Positive for unexpected weight change.       Approximate 15 pound weight loss since January 2016  HENT: Negative.    Respiratory: Negative.   Cardiovascular: Negative.   Gastrointestinal: Positive for abdominal pain and constipation.  Musculoskeletal: Negative.   Skin: Negative.   Neurological: Negative.   Psychiatric/Behavioral: Negative.   All other systems reviewed and are negative.     Allergies  Review of patient's allergies indicates no known allergies.  Home Medications   Prior to Admission medications   Medication Sig Start Date End Date Taking? Authorizing Provider  Sodium Phosphates (ENEMA RE) Place rectally.   Yes Historical Provider, MD  aspirin 81 MG tablet Take 81 mg by mouth daily.    Historical Provider, MD  beta carotene w/minerals (OCUVITE) tablet Take 1 tablet by mouth daily.    Historical Provider, MD  Cholecalciferol (VITAMIN D) 2000 UNITS tablet Take 2,000 Units by mouth daily.    Historical Provider, MD  estrogens, conjugated, (PREMARIN) 0.3 MG tablet Take one tablet every other day for one month 03/18/14   [name] L [name], DO  levothyroxine (SYNTHROID, LEVOTHROID) 100 MCG tablet TAKE ONE TABLET BY MOUTH 30 MINUTES BEFORE BREAKFAST EVERY MORNING. SEPARATE FROM OTHER MEDICATIONS FOR THYROID. 12/20/14   [name] L [name], DO  losartan (COZAAR) 50 MG tablet Take one tablet by mouth once daily for blood pressure 02/28/15   [name] L [name], DO  niacin 500 MG tablet Take 500 mg by mouth daily with breakfast.    Historical Provider, MD  PREMARIN 0.3 MG tablet TAKE  1 TABLET(S) BY MOUTH EVERY OTHER DAY 08/20/14   [name] L [name], DO  simvastatin (ZOCOR) 40 MG tablet Take 1 tablet (40 mg total) by mouth every other day. 04/14/15   [name] L [name], DO  spironolactone-hydrochlorothiazide (ALDACTAZIDE) 25-25 MG per tablet TAKE 1/2 TABLET BY MOUTH ONCE DAILY 04/11/15   [name] L [name], DO   BP 145/67 mmHg  Pulse 82  Temp(Src) 98.1 F (36.7 C) (Oral)  Resp 18  Ht 5' 3.5" (1.613 m)  Wt 152 lb (68.947 kg)  BMI 26.50 kg/m2  SpO2 99% Physical Exam  Constitutional: She appears well-developed and  well-nourished.  HENT:  Head: Normocephalic and atraumatic.  Eyes: Conjunctivae are normal. Pupils are equal, round, and reactive to light.  Neck: Neck supple. No tracheal deviation present. No thyromegaly present.  Cardiovascular: Normal rate and regular rhythm.   No murmur heard. Pulmonary/Chest: Effort normal and breath sounds normal.  Abdominal: Soft. Bowel sounds are normal. She exhibits no distension and no mass. There is tenderness. There is no rebound and no guarding.  Tender left lower quadrant  Genitourinary:  Rectal normal tone and brown stool no gross blood  Musculoskeletal: Normal range of motion. She exhibits no edema or tenderness.  Neurological: She is alert. Coordination normal.  Skin: Skin is warm and dry. No rash noted.  Psychiatric: She has a normal mood and affect.  Nursing note and vitals reviewed.   ED Course  Procedures (including critical care time) Labs Review Labs Reviewed  URINALYSIS, ROUTINE W REFLEX MICROSCOPIC (NOT AT [facility]) - Abnormal; Notable for the following:    APPearance CLOUDY (*)    All other components within normal limits  CBC - Abnormal; Notable for the following:    WBC 15.0 (*)    All other components within normal limits  COMPREHENSIVE METABOLIC PANEL    Imaging Review Dg Abd 1 View  05/03/2015   CLINICAL DATA:  LBM was 3 days ago. Tried an enema but that did not help much. Having discomfort in her abdomen  EXAM: ABDOMEN - 1 VIEW  COMPARISON:  03/15/2011  FINDINGS: Normal bowel gas pattern.  Stable right upper quadrant 5 mm peripheral calcification. Probable splenic calcifications in the left upper abdomen. Surgical clips right upper abdomen.  Regional bones unremarkable.  IMPRESSION: Normal bowel gas pattern.   Electronically Signed   By: Lucrezia Europe M.D.   On: 05/03/2015 17:06   I have personally reviewed and evaluated these images and lab results as part of my medical decision-making.   EKG Interpretation None     Declined pain  medicine. X-ray viewed by me Results for orders placed or performed during the hospital encounter of 05/03/15  Urinalysis, Routine w reflex microscopic (not at [facility])  Result Value Ref Range   Color, Urine YELLOW YELLOW   APPearance CLOUDY (A) CLEAR   Specific Gravity, Urine 1.012 1.005 - 1.030   pH 7.0 5.0 - 8.0   Glucose, UA NEGATIVE NEGATIVE mg/dL   Hgb urine dipstick NEGATIVE NEGATIVE   Bilirubin Urine NEGATIVE NEGATIVE   Ketones, ur NEGATIVE NEGATIVE mg/dL   Protein, ur NEGATIVE NEGATIVE mg/dL   Urobilinogen, UA 1.0 0.0 - 1.0 mg/dL   Nitrite NEGATIVE NEGATIVE   Leukocytes, UA NEGATIVE NEGATIVE  Comprehensive metabolic panel  Result Value Ref Range   Sodium 135 135 - 145 mmol/L   Potassium 3.6 3.5 - 5.1 mmol/L   Chloride 98 (L) 101 - 111 mmol/L   CO2 26 22 - 32 mmol/L   Glucose,  Bld 137 (H) 65 - 99 mg/dL   BUN 19 6 - 20 mg/dL   Creatinine, Ser 1.17 (H) 0.44 - 1.00 mg/dL   Calcium 10.0 8.9 - 10.3 mg/dL   Total Protein 7.4 6.5 - 8.1 g/dL   Albumin 3.6 3.5 - 5.0 g/dL   AST 27 15 - 41 U/L   ALT 19 14 - 54 U/L   Alkaline Phosphatase 68 38 - 126 U/L   Total Bilirubin 0.7 0.3 - 1.2 mg/dL   GFR calc non Af Amer 41 (L) >60 mL/min   GFR calc Af Amer 47 (L) >60 mL/min   Anion gap 11 5 - 15  CBC  Result Value Ref Range   WBC 15.0 (H) 4.0 - 10.5 K/uL   RBC 4.89 3.87 - 5.11 MIL/uL   Hemoglobin 14.0 12.0 - 15.0 g/dL   HCT 41.7 36.0 - 46.0 %   MCV 85.3 78.0 - 100.0 fL   MCH 28.6 26.0 - 34.0 pg   MCHC 33.6 30.0 - 36.0 g/dL   RDW 12.6 11.5 - 15.5 %   Platelets 282 150 - 400 K/uL   Dg Abd 1 View  05/03/2015   CLINICAL DATA:  LBM was 3 days ago. Tried an enema but that did not help much. Having discomfort in her abdomen  EXAM: ABDOMEN - 1 VIEW  COMPARISON:  03/15/2011  FINDINGS: Normal bowel gas pattern.  Stable right upper quadrant 5 mm peripheral calcification. Probable splenic calcifications in the left upper abdomen. Surgical clips right upper abdomen.  Regional bones  unremarkable.  IMPRESSION: Normal bowel gas pattern.   Electronically Signed   By: Lucrezia Europe M.D.   On: 05/03/2015 17:06   Ct Abdomen Pelvis W Contrast  05/03/2015   CLINICAL DATA:  Pt co LLQ pain x 1 day, pt states right breast ca x 50 years ago, right mastectomy, cholecystectomy  EXAM: CT ABDOMEN AND PELVIS WITH CONTRAST  TECHNIQUE: Multidetector CT imaging of the abdomen and pelvis was performed using the standard protocol following bolus administration of intravenous contrast.  CONTRAST:  35m OMNIPAQUE IOHEXOL 300 MG/ML SOLN, 790mOMNIPAQUE IOHEXOL 300 MG/ML SOLN  COMPARISON:  None.  FINDINGS: Visualized lung bases clear.  Moderate hiatal hernia.  Poorly marginated bilobed 8.1 cm masslike area in hepatic segment 5 containing multiple low-attenuation regions centrally.  Surgical clips in the gallbladder fossa. Scattered punctate splenic calcified granulomas. Unremarkable adrenal glands, kidneys. Mild pancreatic parenchymal atrophy. Heavy aortoiliac arterial calcifications without aneurysm. Portal vein patent. Stomach, small bowel, and colon are nondilated. Multiple diverticula from the proximal sigmoid colon with eccentric wall thickening at the descending/sigmoid junction with mild adjacent inflammatory/edematous changes. There is adjacent 3.8 cm loculated fluid attenuation collection medial to the proximal external iliac vessels, with a thin well-defined wall and no significant adjacent inflammatory/ edematous changes away from the colon. There is a small amount of free fluid in the cul-de-sac. Urinary bladder incompletely distended. Bilateral pelvic phleboliths. No free air. Degenerative changes in the lower lumbar spine. Advanced facet disease and degenerative disc disease L4-5 allows grade 1/2 anterolisthesis ; no pars defect.  IMPRESSION: 1. Complex hepatic segment 5 mass may represent developing abscess versus primary or secondary neoplasm. This should be approachable for percutaneous aspiration/  biopsy if needed. 2. Diverticulitis at the descending/sigmoid junction. 3. 3.8 cm fluid collection posteromedial to the colonic process, favor adnexal cyst or lymphocele over abscess. 4. Hiatal hernia.   Electronically Signed   By: D Lucrezia Europe.D.   On: 05/03/2015  20:28     MDM  Spoke with Dr. Hal Hope. Patient will be transferred to [facility] plan IV antibiotics may need consultation with interventional radiology, iv antibitocis. Saline lock to be left in place Final diagnoses:  None  Pt requests to be transported by private car Dx #1 diverticulitis #2 hepatic mass #3hyperglycemia     Orlie Dakin, MD 05/03/15 2119  Orlie Dakin, MD 05/03/15 2123

## 2015-05-03 NOTE — ED Notes (Signed)
Pt states that she has not had a bowel movement x 3 days,

## 2015-05-03 NOTE — ED Notes (Signed)
Pt states that she contacted her gi md dr [name], he informed her to go to er to eval for blockage, no past medical history of blockage

## 2015-05-04 ENCOUNTER — Inpatient Hospital Stay (HOSPITAL_COMMUNITY): Payer: Medicare Other

## 2015-05-04 ENCOUNTER — Encounter (HOSPITAL_COMMUNITY): Payer: Self-pay | Admitting: Internal Medicine

## 2015-05-04 DIAGNOSIS — K5792 Diverticulitis of intestine, part unspecified, without perforation or abscess without bleeding: Secondary | ICD-10-CM

## 2015-05-04 DIAGNOSIS — K5732 Diverticulitis of large intestine without perforation or abscess without bleeding: Secondary | ICD-10-CM | POA: Diagnosis present

## 2015-05-04 DIAGNOSIS — R160 Hepatomegaly, not elsewhere classified: Secondary | ICD-10-CM

## 2015-05-04 LAB — CBC WITH DIFFERENTIAL/PLATELET
Basophils Absolute: 0 10*3/uL (ref 0.0–0.1)
Basophils Relative: 0 %
EOS ABS: 0 10*3/uL (ref 0.0–0.7)
EOS PCT: 0 %
HCT: 37.8 % (ref 36.0–46.0)
Hemoglobin: 12.6 g/dL (ref 12.0–15.0)
LYMPHS ABS: 1.4 10*3/uL (ref 0.7–4.0)
LYMPHS PCT: 10 %
MCH: 28.1 pg (ref 26.0–34.0)
MCHC: 33.3 g/dL (ref 30.0–36.0)
MCV: 84.2 fL (ref 78.0–100.0)
MONOS PCT: 7 %
Monocytes Absolute: 1 10*3/uL (ref 0.1–1.0)
Neutro Abs: 12.6 10*3/uL — ABNORMAL HIGH (ref 1.7–7.7)
Neutrophils Relative %: 83 %
PLATELETS: 247 10*3/uL (ref 150–400)
RBC: 4.49 MIL/uL (ref 3.87–5.11)
RDW: 12.6 % (ref 11.5–15.5)
WBC: 15.1 10*3/uL — AB (ref 4.0–10.5)

## 2015-05-04 LAB — COMPREHENSIVE METABOLIC PANEL
ALBUMIN: 2.8 g/dL — AB (ref 3.5–5.0)
ALT: 25 U/L (ref 14–54)
AST: 47 U/L — AB (ref 15–41)
Alkaline Phosphatase: 63 U/L (ref 38–126)
Anion gap: 10 (ref 5–15)
BUN: 14 mg/dL (ref 6–20)
CHLORIDE: 100 mmol/L — AB (ref 101–111)
CO2: 23 mmol/L (ref 22–32)
CREATININE: 1.2 mg/dL — AB (ref 0.44–1.00)
Calcium: 9.4 mg/dL (ref 8.9–10.3)
GFR calc Af Amer: 46 mL/min — ABNORMAL LOW (ref >60)
GFR, EST NON AFRICAN AMERICAN: 39 mL/min — AB (ref >60)
GLUCOSE: 150 mg/dL — AB (ref 65–99)
POTASSIUM: 3.6 mmol/L (ref 3.5–5.1)
SODIUM: 133 mmol/L — AB (ref 135–145)
Total Bilirubin: 0.6 mg/dL (ref 0.3–1.2)
Total Protein: 6.5 g/dL (ref 6.5–8.1)

## 2015-05-04 LAB — GLUCOSE, CAPILLARY
Glucose-Capillary: 130 mg/dL — ABNORMAL HIGH (ref 65–99)
Glucose-Capillary: 120 mg/dL — ABNORMAL HIGH (ref 65–99)

## 2015-05-04 LAB — PROTIME-INR
INR: 1.2 (ref 0.00–1.49)
Prothrombin Time: 15.4 seconds — ABNORMAL HIGH (ref 11.6–15.2)

## 2015-05-04 MED ORDER — ACETAMINOPHEN 650 MG RE SUPP
650.0000 mg | Freq: Four times a day (QID) | RECTAL | Status: DC | PRN
Start: 2015-05-04 — End: 2015-05-06

## 2015-05-04 MED ORDER — GELATIN ABSORBABLE 12-7 MM EX MISC
CUTANEOUS | Status: AC
Start: 2015-05-04 — End: 2015-05-05
  Filled 2015-05-04: qty 1

## 2015-05-04 MED ORDER — MIDAZOLAM HCL 2 MG/2ML IJ SOLN
INTRAMUSCULAR | Status: AC
  Filled 2015-05-04: qty 2

## 2015-05-04 MED ORDER — FENTANYL CITRATE (PF) 100 MCG/2ML IJ SOLN
INTRAMUSCULAR | Status: AC | PRN
  Administered 2015-05-04: 50 ug via INTRAVENOUS

## 2015-05-04 MED ORDER — MORPHINE SULFATE (PF) 4 MG/ML IV SOLN
INTRAVENOUS | Status: AC
  Administered 2015-05-04: 2 mg
  Filled 2015-05-04: qty 1

## 2015-05-04 MED ORDER — MORPHINE SULFATE (PF) 2 MG/ML IV SOLN
1.0000 mg | INTRAVENOUS | Status: DC | PRN
  Administered 2015-05-04: 1 mg via INTRAVENOUS
  Filled 2015-05-04 (×2): qty 1

## 2015-05-04 MED ORDER — MIDAZOLAM HCL 2 MG/2ML IJ SOLN
INTRAMUSCULAR | Status: AC | PRN
  Administered 2015-05-04: 1 mg via INTRAVENOUS

## 2015-05-04 MED ORDER — ONDANSETRON HCL 4 MG/2ML IJ SOLN
4.0000 mg | Freq: Four times a day (QID) | INTRAMUSCULAR | Status: DC | PRN
  Administered 2015-05-05: 4 mg via INTRAVENOUS
  Filled 2015-05-04: qty 2

## 2015-05-04 MED ORDER — ACETAMINOPHEN 325 MG PO TABS
650.0000 mg | ORAL_TABLET | Freq: Four times a day (QID) | ORAL | Status: DC | PRN
  Filled 2015-05-04: qty 2

## 2015-05-04 MED ORDER — METRONIDAZOLE IN NACL 5-0.79 MG/ML-% IV SOLN: 500.0000 mg | Freq: Three times a day (TID) | INTRAVENOUS | Status: DC

## 2015-05-04 MED ORDER — LEVOTHYROXINE SODIUM 100 MCG PO TABS
100.0000 ug | ORAL_TABLET | Freq: Every day | ORAL | Status: DC
  Administered 2015-05-05 – 2015-05-06 (×2): 100 ug via ORAL
  Filled 2015-05-04 (×3): qty 1

## 2015-05-04 MED ORDER — CIPROFLOXACIN IN D5W 400 MG/200ML IV SOLN
400.0000 mg | Freq: Two times a day (BID) | INTRAVENOUS | Status: DC
  Administered 2015-05-04 – 2015-05-06 (×4): 400 mg via INTRAVENOUS
  Filled 2015-05-04 (×4): qty 200

## 2015-05-04 MED ORDER — LOSARTAN POTASSIUM 50 MG PO TABS
50.0000 mg | ORAL_TABLET | Freq: Every day | ORAL | Status: DC
  Administered 2015-05-04: 50 mg via ORAL
  Filled 2015-05-04 (×3): qty 1

## 2015-05-04 MED ORDER — SODIUM CHLORIDE 0.9 % IV SOLN
INTRAVENOUS | Status: AC
Start: 2015-05-04 — End: 2015-05-05
  Administered 2015-05-04: 18:00:00 via INTRAVENOUS

## 2015-05-04 MED ORDER — HYDRALAZINE HCL 20 MG/ML IJ SOLN: 10.0000 mg | INTRAMUSCULAR | Status: DC | PRN

## 2015-05-04 MED ORDER — LIDOCAINE-EPINEPHRINE 1 %-1:100000 IJ SOLN
INTRAMUSCULAR | Status: AC
  Filled 2015-05-04: qty 1

## 2015-05-04 MED ORDER — LEVOTHYROXINE SODIUM 100 MCG IV SOLR
50.0000 ug | Freq: Every day | INTRAVENOUS | Status: DC
Start: 2015-05-04 — End: 2015-05-04
  Administered 2015-05-04: 50 ug via INTRAVENOUS
  Filled 2015-05-04: qty 5

## 2015-05-04 MED ORDER — FENTANYL CITRATE (PF) 100 MCG/2ML IJ SOLN
INTRAMUSCULAR | Status: AC
  Filled 2015-05-04: qty 2

## 2015-05-04 MED ORDER — ONDANSETRON HCL 4 MG PO TABS: 4.0000 mg | ORAL_TABLET | Freq: Four times a day (QID) | ORAL | Status: DC | PRN

## 2015-05-04 MED ORDER — MORPHINE SULFATE (PF) 2 MG/ML IV SOLN
2.0000 mg | INTRAVENOUS | Status: DC | PRN
  Administered 2015-05-04: 2 mg via INTRAVENOUS
  Filled 2015-05-04: qty 1

## 2015-05-04 MED ORDER — METRONIDAZOLE IN NACL 5-0.79 MG/ML-% IV SOLN
500.0000 mg | Freq: Three times a day (TID) | INTRAVENOUS | Status: DC
  Administered 2015-05-04 – 2015-05-06 (×6): 500 mg via INTRAVENOUS
  Filled 2015-05-04 (×7): qty 100

## 2015-05-04 MED ORDER — BOOST / RESOURCE BREEZE PO LIQD
1.0000 | Freq: Three times a day (TID) | ORAL | Status: DC
  Administered 2015-05-04 – 2015-05-05 (×2): 1 via ORAL

## 2015-05-04 MED ORDER — SIMVASTATIN 40 MG PO TABS
40.0000 mg | ORAL_TABLET | ORAL | Status: DC
  Administered 2015-05-04: 40 mg via ORAL
  Filled 2015-05-04 (×2): qty 1

## 2015-05-04 NOTE — Progress Notes (Signed)
Initial Nutrition Assessment  DOCUMENTATION CODES:   Not applicable  INTERVENTION:   -Boost Breeze po TID, each supplement provides 250 kcal and 9 grams of protein  NUTRITION DIAGNOSIS:   Inadequate oral intake related to altered GI function as evidenced by NPO status (just advanced to clears).  GOAL:   Patient will meet greater than or equal to 90% of their needs  MONITOR:   PO intake, Supplement acceptance, Diet advancement, Labs, Weight trends, Skin, I & O's  REASON FOR ASSESSMENT:   Malnutrition Screening Tool    ASSESSMENT:   Destiny Velasquez is a 81 y.o. female with history of hypertension, hyperlipidemia, hypothyroidism presents to the ER because of crampy abdominal pain since 11 AM yesterday. Pain is mostly in the lower quadrants. Denies any nausea vomiting or diarrhea. CT abdomen and pelvis done in the ER shows diverticulitis affecting the sigmoid area. In addition there is also a liver mass which is not clear and has been requested to have further workup with possible biopsy to rule out any abscess or malignancy.   Pt admitted with diverticulitis.   Per MD notes, pt also with liver mass.   Pt out of room at time of visit. Unable to complete Nutrition-Focused physical exam at this time. Spoke with RN, who reports that pt has been down for a liver biopsy for the majority of the day and is unsure of when she is going to return. RN reports pt has been complaining of a lot of pain.  Wt hx reviewed, which reveals a progressive wt loss for > 1 year. Noted a 14.7% wt loss over the past year. RD suspects poor po intake PTA and potential for malnutrition, however, unable to identify at this time.   Pt recently advanced from NPO to clear liquid diet. Will add Boost Breeze supplement to promote nutritional adequacy.   Labs reviewed: Na: 133 (on IV supplementation), AST 47.  Diet Order:  Diet regular Room service appropriate?: Yes; Fluid consistency:: Thin  Skin:  Reviewed, no  issues  Last BM:  04/30/15  Height:   Ht Readings from Last 1 Encounters:  05/03/15 '5\' 3"'$  (1.6 m)    Weight:   Wt Readings from Last 1 Encounters:  05/04/15 139 lb 8.8 oz (63.3 kg)    Ideal Body Weight:  52.3 kg  BMI:  Body mass index is 24.73 kg/(m^2).  Estimated Nutritional Needs:   Kcal:  1600-1800  Protein:  75-85 grams  Fluid:  >1.6 L  EDUCATION NEEDS:   No education needs identified at this time  Destiny Velasquez, RD, LDN, CDE Pager: [phone] After hours Pager: [phone]

## 2015-05-04 NOTE — Care Management Note (Signed)
Case Management Note  Patient Details  Name: Destiny Velasquez MRN: [id number] Date of Birth: January 18, 1926  Subjective/Objective:                  Date: 05-04-15 Wednesday Spoke with patient at the bedside. Introduced self as Tourist information centre manager and explained role in discharge planning and how to be reached. Verified patient lives in [place], in a one story home, with spouse who is 79 years old who is in overall good health and able to perform ADL's, has DME cane. Expressed potential need for no other DME. Verified patient anticipates to go home with family, at time of discharge and will have part-time supervision by family,  friends neighbors at this time to best of their knowledge. Patient  denied needing help with their medication, gets it at [facility] and is "pretty well stocked up." Patient drives to MD appointments, however expressed need for transportation home from hospital, SW consult placed.. Verified patient has PCP Dr Mariea Clonts at [facility]. Patient was provided choice and selected [facility] for home health needs if they arise.  Plan: CM will continue to follow for discharge planning and [facility] resources.   Carles Collet RN BSN CM [phone]   Action/Plan:   Expected Discharge Date:                  Expected Discharge Plan:  [place]  In-House Referral:     Discharge planning Services  CM Consult  Post Acute Care Choice:    Choice offered to:     DME Arranged:    DME Agency:     HH Arranged:    HH Agency:     Status of Service:  In process, will continue to follow  Medicare Important Message Given:    Date Medicare IM Given:    Medicare IM give by:    Date Additional Medicare IM Given:    Additional Medicare Important Message give by:     If discussed at [place] of Stay Meetings, dates discussed:    Additional Comments:  Carles Collet, RN 05/04/2015, 10:53 AM

## 2015-05-04 NOTE — Sedation Documentation (Signed)
Arouses easily. Respirations shallow. 02 increased to 3L [place]

## 2015-05-04 NOTE — Sedation Documentation (Signed)
Patient denies pain and is resting comfortably.  

## 2015-05-04 NOTE — Consult Note (Signed)
[place] [facility]  Date of Admission:  05/03/2015  Date of Consult:  05/04/2015  Reason for Consult: liver abscess Referring Physician: Dhungel  Impression/Recommendation Liver Mass  Await results of Bx  Somewhat odd as she has no other findings on her CT (primary masses).   Diverticulitis  Would continue cipro/flagyl  Watch her clinical status  F/u wbc.   Thank you so much for this interesting consult,   Destiny Velasquez (pager) [phone] [email or web address]  Destiny Velasquez is an 81 y.o. female.  HPI: 81 yo F with hx of divertiuclosis comes to ED on 9-21 with abd pain starting on 9-20. She was afebrile, her WBC was 15. he was found on CT abd to have diverticulitis as well as a liver mass. She was started on cipro/flagyl. She was taken to IR today and underwent liver bx of mas.    Past Medical History  Diagnosis Date  . Hypertension   . Hyperlipidemia   . Thyroid disease   . Subacute delirium   . Essential and other specified forms of tremor   . Unspecified hypothyroidism   . Diverticulosis of colon (without mention of hemorrhage)   . Unspecified constipation   . Anal and rectal polyp   . Disorder of bone and cartilage, unspecified   . Malignant neoplasm of breast (female), unspecified site   . Breast cancer, right breast 1971    "never had chemo or radiation"  . Complication of anesthesia     "they have a hard time waking me up afterwards; it takes me a long time to recover"  . Pneumonia X 1  . Arthritis     "touch in my hands for a while; it went away" (05/03/2015)    Past Surgical History  Procedure Laterality Date  . Appendectomy    . Abdominal hysterectomy    . Tonsillectomy and adenoidectomy  1935  . Cholecystectomy    . Breast biopsy Right 1971  . Mastectomy Right 1971  . Tubal ligation    . Cataract extraction w/ intraocular lens  implant, bilateral Bilateral   . Rectal polypectomy  early 2000's     No Known  Allergies  Medications:  Scheduled: . ciprofloxacin  400 mg Intravenous Q12H  . fentaNYL      . gelatin adsorbable      . levothyroxine  50 mcg Intravenous Daily  . lidocaine-EPINEPHrine      . metronidazole  500 mg Intravenous 3 times per day  . midazolam        Abtx:  Anti-infectives    Start     Dose/Rate Route Frequency Ordered Stop   05/04/15 1200  ciprofloxacin (CIPRO) IVPB 400 mg     400 mg 200 mL/hr over 60 Minutes Intravenous Every 12 hours 05/04/15 0200     05/04/15 0400  metroNIDAZOLE (FLAGYL) IVPB 500 mg     500 mg 100 mL/hr over 60 Minutes Intravenous 3 times per day 05/04/15 0140     05/04/15 0200  metroNIDAZOLE (FLAGYL) IVPB 500 mg  Status:  Discontinued     500 mg 100 mL/hr over 60 Minutes Intravenous 3 times per day 05/04/15 0125 05/04/15 0140   05/03/15 2045  ciprofloxacin (CIPRO) IVPB 400 mg    Comments:  Diverticulitis   400 mg 200 mL/hr over 60 Minutes Intravenous  Once 05/03/15 2038 05/04/15 0205   05/03/15 2045  metroNIDAZOLE (FLAGYL) IVPB 500 mg     500 mg 100 mL/hr over 60 Minutes Intravenous  Once 05/03/15 2038 05/03/15 2150      Total days of antibiotics: 0 cipro/flagyl          Social History:  reports that she has quit smoking. Her smoking use included Cigarettes. She has a 20 pack-year smoking history. She has never used smokeless tobacco. She reports that she drinks about 3.0 oz of alcohol per week. She reports that she does not use illicit drugs.  Family History  Problem Relation Age of Onset  . Cancer Mother   . Hypertension Father     General ROS: no f/c, feels better post large BM, no diarrhea, abd pain improved.  Please see HPI. 12 point ROS o/w (-)  Blood pressure 130/52, pulse 70, temperature 97.7 F (36.5 C), temperature source Oral, resp. rate 18, height 5' 3"  (1.6 m), weight 63.3 kg (139 lb 8.8 oz), SpO2 100 %. General appearance: alert, cooperative and no distress Eyes: negative findings: conjunctivae and sclerae normal and  pupils equal, round, reactive to light and accomodation Throat: normal findings: oropharynx pink & moist without lesions or evidence of thrush Neck: no adenopathy and supple, symmetrical, trachea midline Lungs: clear to auscultation bilaterally Heart: regular rate and rhythm Abdomen: normal findings: bowel sounds normal and soft, non-tender Extremities: edema none   Results for orders placed or performed during the hospital encounter of 05/03/15 (from the past 48 hour(s))  Urinalysis, Routine w reflex microscopic (not at [facility])     Status: Abnormal   Collection Time: 05/03/15  4:30 PM  Result Value Ref Range   Color, Urine YELLOW YELLOW   APPearance CLOUDY (A) CLEAR   Specific Gravity, Urine 1.012 1.005 - 1.030   pH 7.0 5.0 - 8.0   Glucose, UA NEGATIVE NEGATIVE mg/dL   Hgb urine dipstick NEGATIVE NEGATIVE   Bilirubin Urine NEGATIVE NEGATIVE   Ketones, ur NEGATIVE NEGATIVE mg/dL   Protein, ur NEGATIVE NEGATIVE mg/dL   Urobilinogen, UA 1.0 0.0 - 1.0 mg/dL   Nitrite NEGATIVE NEGATIVE   Leukocytes, UA NEGATIVE NEGATIVE    Comment: MICROSCOPIC NOT DONE ON URINES WITH NEGATIVE PROTEIN, BLOOD, LEUKOCYTES, NITRITE, OR GLUCOSE <1000 mg/dL.  Comprehensive metabolic panel     Status: Abnormal   Collection Time: 05/03/15  5:00 PM  Result Value Ref Range   Sodium 135 135 - 145 mmol/L   Potassium 3.6 3.5 - 5.1 mmol/L   Chloride 98 (L) 101 - 111 mmol/L   CO2 26 22 - 32 mmol/L   Glucose, Bld 137 (H) 65 - 99 mg/dL   BUN 19 6 - 20 mg/dL   Creatinine, Ser 1.17 (H) 0.44 - 1.00 mg/dL   Calcium 10.0 8.9 - 10.3 mg/dL   Total Protein 7.4 6.5 - 8.1 g/dL   Albumin 3.6 3.5 - 5.0 g/dL   AST 27 15 - 41 U/L   ALT 19 14 - 54 U/L   Alkaline Phosphatase 68 38 - 126 U/L   Total Bilirubin 0.7 0.3 - 1.2 mg/dL   GFR calc non Af Amer 41 (L) >60 mL/min   GFR calc Af Amer 47 (L) >60 mL/min    Comment: (NOTE) The eGFR has been calculated using the CKD EPI equation. This calculation has not been validated in all  clinical situations. eGFR's persistently <60 mL/min signify possible Chronic Kidney Disease.    Anion gap 11 5 - 15  CBC     Status: Abnormal   Collection Time: 05/03/15  5:00 PM  Result Value Ref Range   WBC 15.0 (H) 4.0 -  10.5 K/uL   RBC 4.89 3.87 - 5.11 MIL/uL   Hemoglobin 14.0 12.0 - 15.0 g/dL   HCT 41.7 36.0 - 46.0 %   MCV 85.3 78.0 - 100.0 fL   MCH 28.6 26.0 - 34.0 pg   MCHC 33.6 30.0 - 36.0 g/dL   RDW 12.6 11.5 - 15.5 %   Platelets 282 150 - 400 K/uL  Comprehensive metabolic panel     Status: Abnormal   Collection Time: 05/04/15  4:29 AM  Result Value Ref Range   Sodium 133 (L) 135 - 145 mmol/L   Potassium 3.6 3.5 - 5.1 mmol/L   Chloride 100 (L) 101 - 111 mmol/L   CO2 23 22 - 32 mmol/L   Glucose, Bld 150 (H) 65 - 99 mg/dL   BUN 14 6 - 20 mg/dL   Creatinine, Ser 1.20 (H) 0.44 - 1.00 mg/dL   Calcium 9.4 8.9 - 10.3 mg/dL   Total Protein 6.5 6.5 - 8.1 g/dL   Albumin 2.8 (L) 3.5 - 5.0 g/dL   AST 47 (H) 15 - 41 U/L   ALT 25 14 - 54 U/L   Alkaline Phosphatase 63 38 - 126 U/L   Total Bilirubin 0.6 0.3 - 1.2 mg/dL   GFR calc non Af Amer 39 (L) >60 mL/min   GFR calc Af Amer 46 (L) >60 mL/min    Comment: (NOTE) The eGFR has been calculated using the CKD EPI equation. This calculation has not been validated in all clinical situations. eGFR's persistently <60 mL/min signify possible Chronic Kidney Disease.    Anion gap 10 5 - 15  CBC WITH DIFFERENTIAL     Status: Abnormal   Collection Time: 05/04/15  4:29 AM  Result Value Ref Range   WBC 15.1 (H) 4.0 - 10.5 K/uL   RBC 4.49 3.87 - 5.11 MIL/uL   Hemoglobin 12.6 12.0 - 15.0 g/dL   HCT 37.8 36.0 - 46.0 %   MCV 84.2 78.0 - 100.0 fL   MCH 28.1 26.0 - 34.0 pg   MCHC 33.3 30.0 - 36.0 g/dL   RDW 12.6 11.5 - 15.5 %   Platelets 247 150 - 400 K/uL   Neutrophils Relative % 83 %   Neutro Abs 12.6 (H) 1.7 - 7.7 K/uL   Lymphocytes Relative 10 %   Lymphs Abs 1.4 0.7 - 4.0 K/uL   Monocytes Relative 7 %   Monocytes Absolute 1.0 0.1  - 1.0 K/uL   Eosinophils Relative 0 %   Eosinophils Absolute 0.0 0.0 - 0.7 K/uL   Basophils Relative 0 %   Basophils Absolute 0.0 0.0 - 0.1 K/uL  Glucose, capillary     Status: Abnormal   Collection Time: 05/04/15  6:21 AM  Result Value Ref Range   Glucose-Capillary 130 (H) 65 - 99 mg/dL  Protime-INR     Status: Abnormal   Collection Time: 05/04/15 12:23 PM  Result Value Ref Range   Prothrombin Time 15.4 (H) 11.6 - 15.2 seconds   INR 1.20 0.00 - 1.49   No results found for: SDES, SPECREQUEST, CULT, REPTSTATUS Dg Abd 1 View  05/03/2015   CLINICAL DATA:  LBM was 3 days ago. Tried an enema but that did not help much. Having discomfort in her abdomen  EXAM: ABDOMEN - 1 VIEW  COMPARISON:  03/15/2011  FINDINGS: Normal bowel gas pattern.  Stable right upper quadrant 5 mm peripheral calcification. Probable splenic calcifications in the left upper abdomen. Surgical clips right upper abdomen.  Regional bones unremarkable.  IMPRESSION: Normal bowel gas pattern.   Electronically Signed   By: Lucrezia Europe M.D.   On: 05/03/2015 17:06   Ct Abdomen Pelvis W Contrast  05/03/2015   CLINICAL DATA:  Pt co LLQ pain x 1 day, pt states right breast ca x 50 years ago, right mastectomy, cholecystectomy  EXAM: CT ABDOMEN AND PELVIS WITH CONTRAST  TECHNIQUE: Multidetector CT imaging of the abdomen and pelvis was performed using the standard protocol following bolus administration of intravenous contrast.  CONTRAST:  56m OMNIPAQUE IOHEXOL 300 MG/ML SOLN, 769mOMNIPAQUE IOHEXOL 300 MG/ML SOLN  COMPARISON:  None.  FINDINGS: Visualized lung bases clear.  Moderate hiatal hernia.  Poorly marginated bilobed 8.1 cm masslike area in hepatic segment 5 containing multiple low-attenuation regions centrally.  Surgical clips in the gallbladder fossa. Scattered punctate splenic calcified granulomas. Unremarkable adrenal glands, kidneys. Mild pancreatic parenchymal atrophy. Heavy aortoiliac arterial calcifications without aneurysm. Portal  vein patent. Stomach, small bowel, and colon are nondilated. Multiple diverticula from the proximal sigmoid colon with eccentric wall thickening at the descending/sigmoid junction with mild adjacent inflammatory/edematous changes. There is adjacent 3.8 cm loculated fluid attenuation collection medial to the proximal external iliac vessels, with a thin well-defined wall and no significant adjacent inflammatory/ edematous changes away from the colon. There is a small amount of free fluid in the cul-de-sac. Urinary bladder incompletely distended. Bilateral pelvic phleboliths. No free air. Degenerative changes in the lower lumbar spine. Advanced facet disease and degenerative disc disease L4-5 allows grade 1/2 anterolisthesis ; no pars defect.  IMPRESSION: 1. Complex hepatic segment 5 mass may represent developing abscess versus primary or secondary neoplasm. This should be approachable for percutaneous aspiration/ biopsy if needed. 2. Diverticulitis at the descending/sigmoid junction. 3. 3.8 cm fluid collection posteromedial to the colonic process, favor adnexal cyst or lymphocele over abscess. 4. Hiatal hernia.   Electronically Signed   By: D Lucrezia Europe.D.   On: 05/03/2015 20:28   No results found for this or any previous visit (from the past 240 hour(s)).    05/04/2015, 2:47 PM     LOS: 1 day    Records and images were personally reviewed where available.

## 2015-05-04 NOTE — Sedation Documentation (Signed)
Placed on 2 [place]

## 2015-05-04 NOTE — Progress Notes (Signed)
[facility] PROGRESS NOTE  Destiny Velasquez JJH:[id number] DOB: 09-12-1925 DOA: 05/03/2015 PCP: Hollace Kinnier, DO  Narrative 81 year old female with history of hypertension, hyperlipidemia, hypothyroidism presented to the ED with lower abdominal pain for past 10-14 days worsened on the day of admission. Denies any nausea, vomiting or diarrhea. CT abdomen and pelvis done in the ED showed sigmoid diverticulitis with 3.8 cm pericolonic fluid collection. Also showed an incidental complex hepatic segment mass measuring 5 cm concerning for developing abscess.   Assessment/Plan: Sigmoid diverticulitis/descending colon junction Empiric IV Cipro and Flagyl. Continue when necessary morphine for pain. CT scan shows a fluid collection posterior and medial to the colonic process, which per radiologist is more likely a lymphocele or cyst versus abscess. Admitting hospitalist is discussed with on-call surgeon who recommended repeat CT scan of abdomen and pelvis in 2 days. We'll place her on clear liquid.  Liver mass Discussed with radiologist and it appears to be a liver abscess. Plan to aspirate the lesion. Ordered labs for Gram stain, cultures and cytology. ID consulted for further recommendations.  Essential hypertension Resume home medications.  Hypothyroidism She is to oral Synthroid.  Chronic kidney disease stage III Renal function at baseline  Hyperlipidemia Resume statin  Diet: Clear liquid DVT prophylaxis: Subcutaneous Lovenox  Code Status: Full code Family Communication: Daughter at bedside Disposition Plan: Home once symptoms improve.   Consultants:  IR  ID  Procedures:  CT abdomen and pelvis  Liver mass biopsy  Antibiotics:  IV Cipro and Flagyl since 9/21--  HPI/Subjective: Since seen and examined. Complains of pain in her left lower quadrant and mild discomfort in her right upper quadrant. No nausea or vomiting.  Objective: Filed Vitals:   05/04/15 1459  BP:  123/45  Pulse: 86  Temp:   Resp: 11    Intake/Output Summary (Last 24 hours) at 05/04/15 1503 Last data filed at 05/04/15 0840  Gross per 24 hour  Intake      0 ml  Output    400 ml  Net   -400 ml   Filed Weights   05/03/15 1630 05/03/15 2235 05/04/15 0622  Weight: 68.947 kg (152 lb) 69.3 kg (152 lb 12.5 oz) 63.3 kg (139 lb 8.8 oz)    Exam:   General:  Elderly female in no acute distress  HEENT: No pallor, moist oral mucosa, supple neck  Chest: Clear to auscultation bilaterally  CVS: Normal S1 and S2, no murmurs rub or gallop  GI: Soft, nondistended, left lower quadrant tenderness, mild right upper quadrant tenderness, bowel sounds present  Musculoskeletal: Warm, no edema and slight CNS: Alert and oriented    Data Reviewed: Basic Metabolic Panel:  Recent Labs Lab 05/03/15 1700 05/04/15 0429  NA 135 133*  K 3.6 3.6  CL 98* 100*  CO2 26 23  GLUCOSE 137* 150*  BUN 19 14  CREATININE 1.17* 1.20*  CALCIUM 10.0 9.4   Liver Function Tests:  Recent Labs Lab 05/03/15 1700 05/04/15 0429  AST 27 47*  ALT 19 25  ALKPHOS 68 63  BILITOT 0.7 0.6  PROT 7.4 6.5  ALBUMIN 3.6 2.8*   No results for input(s): LIPASE, AMYLASE in the last 168 hours. No results for input(s): AMMONIA in the last 168 hours. CBC:  Recent Labs Lab 05/03/15 1700 05/04/15 0429  WBC 15.0* 15.1*  NEUTROABS  --  12.6*  HGB 14.0 12.6  HCT 41.7 37.8  MCV 85.3 84.2  PLT 282 247   Cardiac Enzymes: No results for input(s): CKTOTAL,  CKMB, CKMBINDEX, TROPONINI in the last 168 hours. BNP (last 3 results) No results for input(s): BNP in the last 8760 hours.  ProBNP (last 3 results) No results for input(s): PROBNP in the last 8760 hours.  CBG:  Recent Labs Lab 05/04/15 0621  GLUCAP 130*    No results found for this or any previous visit (from the past 240 hour(s)).   Studies: Dg Abd 1 View  05/03/2015   CLINICAL DATA:  LBM was 3 days ago. Tried an enema but that did not help  much. Having discomfort in her abdomen  EXAM: ABDOMEN - 1 VIEW  COMPARISON:  03/15/2011  FINDINGS: Normal bowel gas pattern.  Stable right upper quadrant 5 mm peripheral calcification. Probable splenic calcifications in the left upper abdomen. Surgical clips right upper abdomen.  Regional bones unremarkable.  IMPRESSION: Normal bowel gas pattern.   Electronically Signed   By: Lucrezia Europe M.D.   On: 05/03/2015 17:06   Ct Abdomen Pelvis W Contrast  05/03/2015   CLINICAL DATA:  Pt co LLQ pain x 1 day, pt states right breast ca x 50 years ago, right mastectomy, cholecystectomy  EXAM: CT ABDOMEN AND PELVIS WITH CONTRAST  TECHNIQUE: Multidetector CT imaging of the abdomen and pelvis was performed using the standard protocol following bolus administration of intravenous contrast.  CONTRAST:  62m OMNIPAQUE IOHEXOL 300 MG/ML SOLN, 737mOMNIPAQUE IOHEXOL 300 MG/ML SOLN  COMPARISON:  None.  FINDINGS: Visualized lung bases clear.  Moderate hiatal hernia.  Poorly marginated bilobed 8.1 cm masslike area in hepatic segment 5 containing multiple low-attenuation regions centrally.  Surgical clips in the gallbladder fossa. Scattered punctate splenic calcified granulomas. Unremarkable adrenal glands, kidneys. Mild pancreatic parenchymal atrophy. Heavy aortoiliac arterial calcifications without aneurysm. Portal vein patent. Stomach, small bowel, and colon are nondilated. Multiple diverticula from the proximal sigmoid colon with eccentric wall thickening at the descending/sigmoid junction with mild adjacent inflammatory/edematous changes. There is adjacent 3.8 cm loculated fluid attenuation collection medial to the proximal external iliac vessels, with a thin well-defined wall and no significant adjacent inflammatory/ edematous changes away from the colon. There is a small amount of free fluid in the cul-de-sac. Urinary bladder incompletely distended. Bilateral pelvic phleboliths. No free air. Degenerative changes in the lower lumbar  spine. Advanced facet disease and degenerative disc disease L4-5 allows grade 1/2 anterolisthesis ; no pars defect.  IMPRESSION: 1. Complex hepatic segment 5 mass may represent developing abscess versus primary or secondary neoplasm. This should be approachable for percutaneous aspiration/ biopsy if needed. 2. Diverticulitis at the descending/sigmoid junction. 3. 3.8 cm fluid collection posteromedial to the colonic process, favor adnexal cyst or lymphocele over abscess. 4. Hiatal hernia.   Electronically Signed   By: D Lucrezia Europe.D.   On: 05/03/2015 20:28    Scheduled Meds: . ciprofloxacin  400 mg Intravenous Q12H  . fentaNYL      . gelatin adsorbable      . levothyroxine  50 mcg Intravenous Daily  . lidocaine-EPINEPHrine      . metronidazole  500 mg Intravenous 3 times per day  . midazolam       Continuous Infusions: . sodium chloride Stopped (05/04/15 0133)    Principal Problem:   Diverticulitis Active Problems:   Hyperlipidemia   Hypothyroidism   Essential hypertension   Liver mass    Time spent: [facility] Pager [phone] 7PM-7AM, please contact night-coverage at [email or web address], password [email or web address] [facility]/21/2016, 3:03 PM  LOS: 1  day

## 2015-05-04 NOTE — H&P (Addendum)
[facility] History and Physical  Destiny Velasquez ZTI:[id number] DOB: 07-24-1926 DOA: 05/03/2015  Referring physician: Dr. Cathleen Fears. Patient was transferred from [facility] [place]. PCP: Hollace Kinnier, DO  Specialists: None.  Chief Complaint: Abdominal pain.  HPI: Destiny Velasquez is a 81 y.o. female with history of hypertension, hyperlipidemia, hypothyroidism presents to the ER because of crampy abdominal pain since 11 AM yesterday. Pain is mostly in the lower quadrants. Denies any nausea vomiting or diarrhea. CT abdomen and pelvis done in the ER shows diverticulitis affecting the sigmoid area. In addition there is also a liver mass which is not clear and has been requested to have further workup with possible biopsy to rule out any abscess or malignancy. On my exam patient's pain has been improved after receiving morphine. Patient otherwise denies any chest pain or shortness of breath. Patient states she has had previous episode of diverticulitis more than 10 years ago. Her last colonoscopy was more than 10 years ago.    Review of Systems: As presented in the history of presenting illness, rest negative.  Past Medical History  Diagnosis Date  . Hypertension   . Hyperlipidemia   . Thyroid disease   . Subacute delirium   . Essential and other specified forms of tremor   . Unspecified hypothyroidism   . Diverticulosis of colon (without mention of hemorrhage)   . Unspecified constipation   . Anal and rectal polyp   . Disorder of bone and cartilage, unspecified   . Malignant neoplasm of breast (female), unspecified site   . Breast cancer, right breast 1971    "never had chemo or radiation"  . Complication of anesthesia     "they have a hard time waking me up afterwards; it takes me a long time to recover"  . Pneumonia X 1  . Arthritis     "touch in my hands for a while; it went away" (05/03/2015)   Past Surgical History  Procedure Laterality Date  . Appendectomy    . Abdominal  hysterectomy    . Tonsillectomy and adenoidectomy  1935  . Cholecystectomy    . Breast biopsy Right 1971  . Mastectomy Right 1971  . Tubal ligation    . Cataract extraction w/ intraocular lens  implant, bilateral Bilateral   . Rectal polypectomy  early 2000's   Social History:  reports that she has quit smoking. Her smoking use included Cigarettes. She has a 20 pack-year smoking history. She has never used smokeless tobacco. She reports that she drinks about 3.0 oz of alcohol per week. She reports that she does not use illicit drugs. Where does patient live home. Can patient participate in ADLs? Yes.  No Known Allergies  Family History:  Family History  Problem Relation Age of Onset  . Cancer Mother   . Hypertension Father       Prior to Admission medications   Medication Sig Start Date End Date Taking? Authorizing [place]ampas Stettner  Sodium Phosphates (ENEMA RE) Place rectally.   Yes Historical Constantinos Krempasky, MD  aspirin 81 MG tablet Take 81 mg by mouth daily.    Historical Nyia Tsao, MD  beta carotene w/minerals (OCUVITE) tablet Take 1 tablet by mouth daily.    Historical Leonilda Cozby, MD  Cholecalciferol (VITAMIN D) 2000 UNITS tablet Take 2,000 Units by mouth daily.    Historical Hancel Ion, MD  estrogens, conjugated, (PREMARIN) 0.3 MG tablet Take one tablet every other day for one month 03/18/14   [name] L [name], DO  levothyroxine (SYNTHROID, LEVOTHROID) 100 MCG  tablet TAKE ONE TABLET BY MOUTH 30 MINUTES BEFORE BREAKFAST EVERY MORNING. SEPARATE FROM OTHER MEDICATIONS FOR THYROID. 12/20/14   [name] L [name], DO  losartan (COZAAR) 50 MG tablet Take one tablet by mouth once daily for blood pressure 02/28/15   [name] L [name], DO  niacin 500 MG tablet Take 500 mg by mouth daily with breakfast.    Historical Zeke Aker, MD  PREMARIN 0.3 MG tablet TAKE 1 TABLET(S) BY MOUTH EVERY OTHER DAY 08/20/14   [name] L [name], DO  simvastatin (ZOCOR) 40 MG tablet Take 1 tablet (40 mg total) by mouth every other day. 04/14/15    [name] L [name], DO  spironolactone-hydrochlorothiazide (ALDACTAZIDE) 25-25 MG per tablet TAKE 1/2 TABLET BY MOUTH ONCE DAILY 04/11/15   Gayland Curry, DO    Physical Exam: Filed Vitals:   05/03/15 1944 05/03/15 2049 05/03/15 2133 05/03/15 2235  BP: 140/69 145/63 165/65 143/70  Pulse: 88 89 88 96  Temp:    98.4 F (36.9 C)  TempSrc:    Oral  Resp:  '20 20 18  '$ Height:    '5\' 3"'$  (1.6 m)  Weight:    69.3 kg (152 lb 12.5 oz)  SpO2: 99%  99% 99%     General:  Moderately built and nourished.  Eyes: Anicteric no pallor.  ENT: No discharge from the ears eyes nose or mouth.  Neck: No mass felt.  Cardiovascular: S1 and S2 heard.  Respiratory: No rhonchi or crepitations.  Abdomen: Soft mild tenderness in the lower quadrants. No guarding or rigidity. Bowel sounds not appreciated.  Skin: No rash.  Musculoskeletal: No edema.  Psychiatric: Appears normal.  Neurologic: Alert awake oriented to time place and person. Moves all extremities.  Labs on Admission:  Basic Metabolic Panel:  Recent Labs Lab 05/03/15 1700  NA 135  K 3.6  CL 98*  CO2 26  GLUCOSE 137*  BUN 19  CREATININE 1.17*  CALCIUM 10.0   Liver Function Tests:  Recent Labs Lab 05/03/15 1700  AST 27  ALT 19  ALKPHOS 68  BILITOT 0.7  PROT 7.4  ALBUMIN 3.6   No results for input(s): LIPASE, AMYLASE in the last 168 hours. No results for input(s): AMMONIA in the last 168 hours. CBC:  Recent Labs Lab 05/03/15 1700  WBC 15.0*  HGB 14.0  HCT 41.7  MCV 85.3  PLT 282   Cardiac Enzymes: No results for input(s): CKTOTAL, CKMB, CKMBINDEX, TROPONINI in the last 168 hours.  BNP (last 3 results) No results for input(s): BNP in the last 8760 hours.  ProBNP (last 3 results) No results for input(s): PROBNP in the last 8760 hours.  CBG: No results for input(s): GLUCAP in the last 168 hours.  Radiological Exams on Admission: Dg Abd 1 View  05/03/2015   CLINICAL DATA:  LBM was 3 days ago. Tried an enema  but that did not help much. Having discomfort in her abdomen  EXAM: ABDOMEN - 1 VIEW  COMPARISON:  03/15/2011  FINDINGS: Normal bowel gas pattern.  Stable right upper quadrant 5 mm peripheral calcification. Probable splenic calcifications in the left upper abdomen. Surgical clips right upper abdomen.  Regional bones unremarkable.  IMPRESSION: Normal bowel gas pattern.   Electronically Signed   By: Lucrezia Europe M.D.   On: 05/03/2015 17:06   Ct Abdomen Pelvis W Contrast  05/03/2015   CLINICAL DATA:  Pt co LLQ pain x 1 day, pt states right breast ca x 50 years ago, right mastectomy, cholecystectomy  EXAM:  CT ABDOMEN AND PELVIS WITH CONTRAST  TECHNIQUE: Multidetector CT imaging of the abdomen and pelvis was performed using the standard protocol following bolus administration of intravenous contrast.  CONTRAST:  3m OMNIPAQUE IOHEXOL 300 MG/ML SOLN, 756mOMNIPAQUE IOHEXOL 300 MG/ML SOLN  COMPARISON:  None.  FINDINGS: Visualized lung bases clear.  Moderate hiatal hernia.  Poorly marginated bilobed 8.1 cm masslike area in hepatic segment 5 containing multiple low-attenuation regions centrally.  Surgical clips in the gallbladder fossa. Scattered punctate splenic calcified granulomas. Unremarkable adrenal glands, kidneys. Mild pancreatic parenchymal atrophy. Heavy aortoiliac arterial calcifications without aneurysm. Portal vein patent. Stomach, small bowel, and colon are nondilated. Multiple diverticula from the proximal sigmoid colon with eccentric wall thickening at the descending/sigmoid junction with mild adjacent inflammatory/edematous changes. There is adjacent 3.8 cm loculated fluid attenuation collection medial to the proximal external iliac vessels, with a thin well-defined wall and no significant adjacent inflammatory/ edematous changes away from the colon. There is a small amount of free fluid in the cul-de-sac. Urinary bladder incompletely distended. Bilateral pelvic phleboliths. No free air. Degenerative  changes in the lower lumbar spine. Advanced facet disease and degenerative disc disease L4-5 allows grade 1/2 anterolisthesis ; no pars defect.  IMPRESSION: 1. Complex hepatic segment 5 mass may represent developing abscess versus primary or secondary neoplasm. This should be approachable for percutaneous aspiration/ biopsy if needed. 2. Diverticulitis at the descending/sigmoid junction. 3. 3.8 cm fluid collection posteromedial to the colonic process, favor adnexal cyst or lymphocele over abscess. 4. Hiatal hernia.   Electronically Signed   By: D Lucrezia Europe.D.   On: 05/03/2015 20:28     Assessment/Plan Principal Problem:   Diverticulitis Active Problems:   Hyperlipidemia   Hypothyroidism   Essential hypertension   Liver mass   1. Diverticulitis of the sigmoid/descending colon junction - since patient still has significant abdominal pain I have place patient nothing by mouth and on IV fluids and Cipro and Flagyl. Patient is also on when necessary morphine for pain relief. There is a fluid collection posterior and medial to the colonic process which as per the radiologist is favored to be lymphocele or cyst over abscess. I did discuss with Dr. TsPrince Solianon call surgeon. Dr. TsPrince Soliant this time is advised to repeat CT scan abdomen and pelvis in 2 days if symptoms does not improve to make sure that it's not an abscess and not enlarging. To continue antibiotics. 2. Liver mass - and have requested interventional radiology consult for possible biopsy. Differentials include liver abscess. Blood cultures being obtained. Patient is on empiric antibiotics. 3. Hypertension - since patient is nothing by mouth I have place patient on when necessary IV hydralazine. 4. Hypothyroidism - since patient is nothing by mouth I have placed patient on IV Synthroid. 5. Hyperlipidemia - continue statins once patient can take orally. 6. Chronic kidney disease stage III - creatinine appears to be at baseline.  I have reviewed  patient's old charts and labs.   DVT Prophylaxis SCDs in anticipation of procedure.  Code Status: Full code.  Family Communication: Discussed with patient.  Disposition Plan: Admit to inpatient.    KAKRAKANDY,ARSHAD N. [facility] Pager [phone] If 7PM-7AM, please contact night-coverage [email or web address] Password [email or web address] 05/04/2015, 1:55 AM

## 2015-05-04 NOTE — Progress Notes (Addendum)
ANTIBIOTIC CONSULT NOTE - INITIAL  Pharmacy Consult for Ciprofloxacin Indication: intra-abd infection  No Known Allergies  Patient Measurements: Height: '5\' 3"'$  (160 cm) Weight: 152 lb 12.5 oz (69.3 kg) IBW/kg (Calculated) : 52.4  Vital Signs: Temp: 98.4 F (36.9 C) (09/20 2235) Temp Source: Oral (09/20 2235) BP: 143/70 mmHg (09/20 2235) Pulse Rate: 96 (09/20 2235) Intake/Output from previous day:   Intake/Output from this shift:    Labs:  Recent Labs  05/03/15 1700  WBC 15.0*  HGB 14.0  PLT 282  CREATININE 1.17*   Estimated Creatinine Clearance: 31.7 mL/min (by C-G formula based on Cr of 1.17). No results for input(s): VANCOTROUGH, VANCOPEAK, VANCORANDOM, GENTTROUGH, GENTPEAK, GENTRANDOM, TOBRATROUGH, TOBRAPEAK, TOBRARND, AMIKACINPEAK, AMIKACINTROU, AMIKACIN in the last 72 hours.   Microbiology: No results found for this or any previous visit (from the past 720 hour(s)).  Medical History: Past Medical History  Diagnosis Date  . Hypertension   . Hyperlipidemia   . Thyroid disease   . Subacute delirium   . Essential and other specified forms of tremor   . Unspecified hypothyroidism   . Diverticulosis of colon (without mention of hemorrhage)   . Unspecified constipation   . Anal and rectal polyp   . Disorder of bone and cartilage, unspecified   . Malignant neoplasm of breast (female), unspecified site   . Breast cancer, right breast 1971    "never had chemo or radiation"  . Complication of anesthesia     "they have a hard time waking me up afterwards; it takes me a long time to recover"  . Pneumonia X 1  . Arthritis     "touch in my hands for a while; it went away" (05/03/2015)    Medications:  Awaiting med rec  Assessment: 81 y.o. female presents with abd pain. To continue Cipro for intra-abd infection - pt received '400mg'$  IV in ED ~0100. Pt also on Flagyl. Scr 1.17, est CrCl 32 ml/min. WBC elevated to 15. Afeb.  Goal of Therapy:  Resolution of  infection  Plan:  Ciprofloxacin '400mg'$  IV q12h Electronic software is in place to alert pharmacy of renal changes that would require dose adjustment.  Rx will sign off. MD to f/u renal function, micro data, and pt's clinical condition   Sherlon Handing, PharmD, BCPS Clinical pharmacist, pager [phone] 05/04/2015,1:55 AM   Horton Chin, Pharm.D., BCPS Clinical Pharmacist Pager [phone] 05/04/2015 10:35 AM

## 2015-05-04 NOTE — Procedures (Signed)
Technically successful US guided biopsy of infiltrative mass within the right lobe of the liver.   No immediate complications.   Ronny Bacon, MD Pager #: [phone]

## 2015-05-04 NOTE — H&P (Signed)
Chief Complaint: Patient was seen in consultation today for hepatic mass/fluid collection-abscess Chief Complaint  Patient presents with  . Abdominal Cramping   at the request of [facility] Dr. Clementeen Graham  Referring Physician(s): Dr. Clementeen Graham  History of Present Illness: Destiny Velasquez is a 81 y.o. female with left lower quadrant abdominal pain, imaging 05/03/15 revealed diverticulitis and complex hepatic mass/possible abscess. IR received request for image guided biopsy/aspiration with possible drain placement. She denies any RUQ pain. She does admit to LLQ pain. She denies any chest pain, shortness of breath or palpitations. She denies any active signs of bleeding or excessive bruising. She denies any recent fever or chills. The patient denies any history of sleep apnea or chronic oxygen use. She denies any known complications to sedation.    Past Medical History  Diagnosis Date  . Hypertension   . Hyperlipidemia   . Thyroid disease   . Subacute delirium   . Essential and other specified forms of tremor   . Unspecified hypothyroidism   . Diverticulosis of colon (without mention of hemorrhage)   . Unspecified constipation   . Anal and rectal polyp   . Disorder of bone and cartilage, unspecified   . Malignant neoplasm of breast (female), unspecified site   . Breast cancer, right breast 1971    "never had chemo or radiation"  . Complication of anesthesia     "they have a hard time waking me up afterwards; it takes me a long time to recover"  . Pneumonia X 1  . Arthritis     "touch in my hands for a while; it went away" (05/03/2015)    Past Surgical History  Procedure Laterality Date  . Appendectomy    . Abdominal hysterectomy    . Tonsillectomy and adenoidectomy  1935  . Cholecystectomy    . Breast biopsy Right 1971  . Mastectomy Right 1971  . Tubal ligation    . Cataract extraction w/ intraocular lens  implant, bilateral Bilateral   . Rectal polypectomy  early 2000's     Allergies: Review of patient's allergies indicates no known allergies.  Medications: Prior to Admission medications   Medication Sig Start Date End Date Taking? Authorizing Provider  estrogens, conjugated, (PREMARIN) 0.3 MG tablet Take one tablet every other day for one month Patient taking differently: Take 0.15 mg by mouth every other day.  03/18/14  Yes [name] L [name], DO  simvastatin (ZOCOR) 40 MG tablet Take 1 tablet (40 mg total) by mouth every other day. 04/14/15  Yes [name] L [name], DO  Sodium Phosphates (ENEMA RE) Place rectally.   Yes Historical Provider, MD  aspirin 81 MG tablet Take 81 mg by mouth daily.    Historical Provider, MD  beta carotene w/minerals (OCUVITE) tablet Take 1 tablet by mouth daily.    Historical Provider, MD  Cholecalciferol (VITAMIN D) 2000 UNITS tablet Take 2,000 Units by mouth daily.    Historical Provider, MD  levothyroxine (SYNTHROID, LEVOTHROID) 100 MCG tablet TAKE ONE TABLET BY MOUTH 30 MINUTES BEFORE BREAKFAST EVERY MORNING. SEPARATE FROM OTHER MEDICATIONS FOR THYROID. 12/20/14   [name] L [name], DO  losartan (COZAAR) 50 MG tablet Take one tablet by mouth once daily for blood pressure 02/28/15   [name] L [name], DO  niacin 500 MG tablet Take 500 mg by mouth daily with breakfast.    Historical Provider, MD  PREMARIN 0.3 MG tablet TAKE 1 TABLET(S) BY MOUTH EVERY OTHER DAY Patient not taking: Reported on 05/04/2015 08/20/14  [name] L [name], DO  spironolactone-hydrochlorothiazide (ALDACTAZIDE) 25-25 MG per tablet TAKE 1/2 TABLET BY MOUTH ONCE DAILY 04/11/15   Gayland Curry, DO     Family History  Problem Relation Age of Onset  . Cancer Mother   . Hypertension Father     Social History   Social History  . Marital Status: Married    Spouse Name: N/A  . Number of Children: N/A  . Years of Education: N/A   Social History Main Topics  . Smoking status: Former Smoker -- 0.50 packs/day for 40 years    Types: Cigarettes  . Smokeless tobacco: Never Used      Comment: "quit smoking cigarettes years ago; I was probably in my 47's"  . Alcohol Use: 3.0 oz/week    5 Glasses of wine per week     Comment: 05/03/2015 "drink of scotch q 3 weeks or so; 2-3 oz red wine ~ q afternoon"  . Drug Use: No  . Sexual Activity: No   Other Topics Concern  . None   Social History Narrative   Review of Systems: A 12 point ROS discussed and pertinent positives are indicated in the HPI above.  All other systems are negative.  Review of Systems  Vital Signs: BP 126/74 mmHg  Pulse 83  Temp(Src) 97.7 F (36.5 C) (Oral)  Resp 20  Ht '5\' 3"'$  (1.6 m)  Wt 139 lb 8.8 oz (63.3 kg)  BMI 24.73 kg/m2  SpO2 98%  Physical Exam  Constitutional: She is oriented to person, place, and time. No distress.  HENT:  Head: Normocephalic and atraumatic.  Neck: No tracheal deviation present.  Cardiovascular: Normal rate and regular rhythm.  Exam reveals no gallop and no friction rub.   No murmur heard. Pulmonary/Chest: Effort normal and breath sounds normal. No respiratory distress. She has no wheezes. She has no rales.  Abdominal: Soft. Bowel sounds are normal. She exhibits no distension. There is tenderness.  LLQ TTP  Neurological: She is alert and oriented to person, place, and time.  Skin: She is not diaphoretic.    Mallampati Score:  MD Evaluation Airway: WNL Heart: WNL Abdomen: WNL Chest/ Lungs: WNL ASA  Classification: 2 Mallampati/Airway Score: Two  Imaging: Dg Abd 1 View  05/03/2015   CLINICAL DATA:  LBM was 3 days ago. Tried an enema but that did not help much. Having discomfort in her abdomen  EXAM: ABDOMEN - 1 VIEW  COMPARISON:  03/15/2011  FINDINGS: Normal bowel gas pattern.  Stable right upper quadrant 5 mm peripheral calcification. Probable splenic calcifications in the left upper abdomen. Surgical clips right upper abdomen.  Regional bones unremarkable.  IMPRESSION: Normal bowel gas pattern.   Electronically Signed   By: Lucrezia Europe M.D.   On: 05/03/2015  17:06   Ct Abdomen Pelvis W Contrast  05/03/2015   CLINICAL DATA:  Pt co LLQ pain x 1 day, pt states right breast ca x 50 years ago, right mastectomy, cholecystectomy  EXAM: CT ABDOMEN AND PELVIS WITH CONTRAST  TECHNIQUE: Multidetector CT imaging of the abdomen and pelvis was performed using the standard protocol following bolus administration of intravenous contrast.  CONTRAST:  78m OMNIPAQUE IOHEXOL 300 MG/ML SOLN, 753mOMNIPAQUE IOHEXOL 300 MG/ML SOLN  COMPARISON:  None.  FINDINGS: Visualized lung bases clear.  Moderate hiatal hernia.  Poorly marginated bilobed 8.1 cm masslike area in hepatic segment 5 containing multiple low-attenuation regions centrally.  Surgical clips in the gallbladder fossa. Scattered punctate splenic calcified granulomas. Unremarkable adrenal glands,  kidneys. Mild pancreatic parenchymal atrophy. Heavy aortoiliac arterial calcifications without aneurysm. Portal vein patent. Stomach, small bowel, and colon are nondilated. Multiple diverticula from the proximal sigmoid colon with eccentric wall thickening at the descending/sigmoid junction with mild adjacent inflammatory/edematous changes. There is adjacent 3.8 cm loculated fluid attenuation collection medial to the proximal external iliac vessels, with a thin well-defined wall and no significant adjacent inflammatory/ edematous changes away from the colon. There is a small amount of free fluid in the cul-de-sac. Urinary bladder incompletely distended. Bilateral pelvic phleboliths. No free air. Degenerative changes in the lower lumbar spine. Advanced facet disease and degenerative disc disease L4-5 allows grade 1/2 anterolisthesis ; no pars defect.  IMPRESSION: 1. Complex hepatic segment 5 mass may represent developing abscess versus primary or secondary neoplasm. This should be approachable for percutaneous aspiration/ biopsy if needed. 2. Diverticulitis at the descending/sigmoid junction. 3. 3.8 cm fluid collection posteromedial to the  colonic process, favor adnexal cyst or lymphocele over abscess. 4. Hiatal hernia.   Electronically Signed   By: Lucrezia Europe M.D.   On: 05/03/2015 20:28    Labs:  CBC:  Recent Labs  09/20/14 0816 05/03/15 1700 05/04/15 0429  WBC 8.5 15.0* 15.1*  HGB 13.6 14.0 12.6  HCT 40.9 41.7 37.8  PLT  --  282 247    COAGS:  Recent Labs  05/04/15 1223  INR 1.20    BMP:  Recent Labs  09/20/14 0816 03/30/15 0813 05/03/15 1700 05/04/15 0429  NA 139 139 135 133*  K 4.2 3.9 3.6 3.6  CL 100 98 98* 100*  CO2 '23 22 26 23  '$ GLUCOSE 132* 119* 137* 150*  BUN '18 18 19 14  '$ CALCIUM 10.8* 9.8 10.0 9.4  CREATININE 1.23* 1.24* 1.17* 1.20*  GFRNONAA 40* 39* 41* 39*  GFRAA 46* 45* 47* 46*    LIVER FUNCTION TESTS:  Recent Labs  09/20/14 0816 05/03/15 1700 05/04/15 0429  BILITOT 0.3 0.7 0.6  AST 16 27 47*  ALT '13 19 25  '$ ALKPHOS 72 68 63  PROT 6.6 7.4 6.5  ALBUMIN  --  3.6 2.8*    Assessment and Plan: Left lower quadrant abdominal pain, imaging 05/03/15 revealed diverticulitis and complex hepatic mass/possible abscess. Request for image guided biopsy/aspiration with possible drain placement with moderate sedation Dr. Pascal Lux has discussed with ordering MD and patient and recommends continued antibiotics with imaging surveillance, however the patient insists to have aspiration/biopsy performed today.  The patient has been NPO, no blood thinners taken, labs and vitals have been reviewed. Risks and Benefits discussed with the patient including, but not limited to bleeding, infection, damage to adjacent structures or low yield requiring additional tests. All of the patient's questions were answered, patient is agreeable to proceed. Consent signed and in chart.   Thank you for this interesting consult.  I greatly enjoyed meeting Destiny Velasquez and look forward to participating in their care.  A copy of this report was sent to the requesting provider on this date.  SignedHedy Jacob 05/04/2015, 2:00 PM   I spent a total of 40 Minutes in face to face in clinical consultation, greater than 50% of which was counseling/coordinating care for liver lesion/possible fluid collection

## 2015-05-05 DIAGNOSIS — I10 Essential (primary) hypertension: Secondary | ICD-10-CM

## 2015-05-05 DIAGNOSIS — K5732 Diverticulitis of large intestine without perforation or abscess without bleeding: Principal | ICD-10-CM

## 2015-05-05 DIAGNOSIS — E039 Hypothyroidism, unspecified: Secondary | ICD-10-CM

## 2015-05-05 LAB — CBC
HCT: 34.3 % — ABNORMAL LOW (ref 36.0–46.0)
Hemoglobin: 11.6 g/dL — ABNORMAL LOW (ref 12.0–15.0)
MCH: 29 pg (ref 26.0–34.0)
MCHC: 33.8 g/dL (ref 30.0–36.0)
MCV: 85.8 fL (ref 78.0–100.0)
PLATELETS: 197 10*3/uL (ref 150–400)
RBC: 4 MIL/uL (ref 3.87–5.11)
RDW: 12.9 % (ref 11.5–15.5)
WBC: 10.9 10*3/uL — ABNORMAL HIGH (ref 4.0–10.5)

## 2015-05-05 LAB — GLUCOSE, CAPILLARY
GLUCOSE-CAPILLARY: 124 mg/dL — AB (ref 65–99)
GLUCOSE-CAPILLARY: 139 mg/dL — AB (ref 65–99)
GLUCOSE-CAPILLARY: 122 mg/dL — AB (ref 65–99)
Glucose-Capillary: 187 mg/dL — ABNORMAL HIGH (ref 65–99)

## 2015-05-05 NOTE — Progress Notes (Signed)
Pt is requesting that the IV antibiotics be stopped. Pt was educated that it is against medical advice and she continued to demand that the IV be stopped. IV has been stopped at this time and pt was notified that the MD would be notified that she is refusing antibiotics. Pt is stating that she wants to call her daughter so that she can leave. Pt was educated that if she leaves it will be against medical advice. Pt is very upset and requested to speak with the charge nurse. Charge nurse was called into the room and explained all of this again. Will continue to monitor and educate pt.

## 2015-05-05 NOTE — Progress Notes (Signed)
[facility] PROGRESS NOTE  Destiny Velasquez OIZ:[id number] DOB: 1925/09/01 DOA: 05/03/2015 PCP: Hollace Kinnier, DO  Narrative 81 year old female with history of hypertension, hyperlipidemia, hypothyroidism presented to the ED with lower abdominal pain for past 10-14 days worsened on the day of admission. Denies any nausea, vomiting or diarrhea. CT abdomen and pelvis done in the ED showed sigmoid diverticulitis with 3.8 cm pericolonic fluid collection. Also showed an incidental complex hepatic segment mass measuring 5 cm concerning for developing abscess.   Assessment/Plan: Sigmoid diverticulitis/descending colon junction Empiric IV Cipro and Flagyl. Continue when necessary morphine for pain. CT scan shows a fluid collection posterior and medial to the colonic process, which per radiologist is more likely a lymphocele or cyst versus abscess. Admitting hospitalist is discussed with on-call surgeon who recommended repeat CT scan of abdomen and pelvis in 2 days. Continue soft diet. Given her  improvement in symptoms, she may not need repeat CT of the abdomen. Discussed with radiologist yesterday and he thinks that is unlikely an abscess around the pericolonic area.   Liver mass Initially appeared to be a liver abscess but on biopsy it looked like an infiltrative mass. Sent for surgical pathology. Results pending.  Essential hypertension Resume home medications.  Hypothyroidism Continue Synthroid  Chronic kidney disease stage III Renal function at baseline  Hyperlipidemia Resume statin  Diet: Soft diet DVT prophylaxis: Subcutaneous Lovenox  Code Status: Full code Family Communication: Husband and Daughter at bedside Disposition Plan: Home possibly tomorrow   Consultants:  IR  ID  Procedures:  CT abdomen and pelvis  Liver mass biopsy  Antibiotics:  IV Cipro and Flagyl since 9/21--  HPI/Subjective: Patient seen and examined. Lower quadrant abdominal pain improving.  Denies right upper quadrant pain. Patient upset during the night as she was getting IV antibiotic and it was beeping to the point that she refused to antibiotic. Patient wishing to go home but I insisted that she stay another day until her symptom is further improved and liver biopsy result is obtained.  Objective: Filed Vitals:   05/05/15 1000  BP:   Pulse:   Temp: 98.1 F (36.7 C)  Resp:     Intake/Output Summary (Last 24 hours) at 05/05/15 1237 Last data filed at 05/05/15 1100  Gross per 24 hour  Intake   1960 ml  Output    950 ml  Net   1010 ml   Filed Weights   05/03/15 1630 05/03/15 2235 05/04/15 0622  Weight: 68.947 kg (152 lb) 69.3 kg (152 lb 12.5 oz) 63.3 kg (139 lb 8.8 oz)    Exam:   General: no acute distress, irritable  HEENT: , moist oral mucosa, supple neck  Chest: Clear to auscultation bilaterally  CVS: Normal S1 and S2, no murmurs rub or gallop  GI: Soft, nondistended, minimal left lower quadrant tenderness, no right upper quadrant tenderness, bowel sounds present  Musculoskeletal: Warm, no edema      Data Reviewed: Basic Metabolic Panel:  Recent Labs Lab 05/03/15 1700 05/04/15 0429  NA 135 133*  K 3.6 3.6  CL 98* 100*  CO2 26 23  GLUCOSE 137* 150*  BUN 19 14  CREATININE 1.17* 1.20*  CALCIUM 10.0 9.4   Liver Function Tests:  Recent Labs Lab 05/03/15 1700 05/04/15 0429  AST 27 47*  ALT 19 25  ALKPHOS 68 63  BILITOT 0.7 0.6  PROT 7.4 6.5  ALBUMIN 3.6 2.8*   No results for input(s): LIPASE, AMYLASE in the last 168 hours. No results for  input(s): AMMONIA in the last 168 hours. CBC:  Recent Labs Lab 05/03/15 1700 05/04/15 0429 05/05/15 0651  WBC 15.0* 15.1* 10.9*  NEUTROABS  --  12.6*  --   HGB 14.0 12.6 11.6*  HCT 41.7 37.8 34.3*  MCV 85.3 84.2 85.8  PLT 282 247 197   Cardiac Enzymes: No results for input(s): CKTOTAL, CKMB, CKMBINDEX, TROPONINI in the last 168 hours. BNP (last 3 results) No results for input(s): BNP  in the last 8760 hours.  ProBNP (last 3 results) No results for input(s): PROBNP in the last 8760 hours.  CBG:  Recent Labs Lab 05/04/15 0621 05/04/15 1810 05/05/15 0004 05/05/15 0609 05/05/15 1233  GLUCAP 130* 120* 187* 124* 139*    No results found for this or any previous visit (from the past 240 hour(s)).   Studies: Dg Abd 1 View  05/03/2015   CLINICAL DATA:  LBM was 3 days ago. Tried an enema but that did not help much. Having discomfort in her abdomen  EXAM: ABDOMEN - 1 VIEW  COMPARISON:  03/15/2011  FINDINGS: Normal bowel gas pattern.  Stable right upper quadrant 5 mm peripheral calcification. Probable splenic calcifications in the left upper abdomen. Surgical clips right upper abdomen.  Regional bones unremarkable.  IMPRESSION: Normal bowel gas pattern.   Electronically Signed   By: Lucrezia Europe M.D.   On: 05/03/2015 17:06   Ct Abdomen Pelvis W Contrast  05/03/2015   CLINICAL DATA:  Pt co LLQ pain x 1 day, pt states right breast ca x 50 years ago, right mastectomy, cholecystectomy  EXAM: CT ABDOMEN AND PELVIS WITH CONTRAST  TECHNIQUE: Multidetector CT imaging of the abdomen and pelvis was performed using the standard protocol following bolus administration of intravenous contrast.  CONTRAST:  69m OMNIPAQUE IOHEXOL 300 MG/ML SOLN, 777mOMNIPAQUE IOHEXOL 300 MG/ML SOLN  COMPARISON:  None.  FINDINGS: Visualized lung bases clear.  Moderate hiatal hernia.  Poorly marginated bilobed 8.1 cm masslike area in hepatic segment 5 containing multiple low-attenuation regions centrally.  Surgical clips in the gallbladder fossa. Scattered punctate splenic calcified granulomas. Unremarkable adrenal glands, kidneys. Mild pancreatic parenchymal atrophy. Heavy aortoiliac arterial calcifications without aneurysm. Portal vein patent. Stomach, small bowel, and colon are nondilated. Multiple diverticula from the proximal sigmoid colon with eccentric wall thickening at the descending/sigmoid junction with mild  adjacent inflammatory/edematous changes. There is adjacent 3.8 cm loculated fluid attenuation collection medial to the proximal external iliac vessels, with a thin well-defined wall and no significant adjacent inflammatory/ edematous changes away from the colon. There is a small amount of free fluid in the cul-de-sac. Urinary bladder incompletely distended. Bilateral pelvic phleboliths. No free air. Degenerative changes in the lower lumbar spine. Advanced facet disease and degenerative disc disease L4-5 allows grade 1/2 anterolisthesis ; no pars defect.  IMPRESSION: 1. Complex hepatic segment 5 mass may represent developing abscess versus primary or secondary neoplasm. This should be approachable for percutaneous aspiration/ biopsy if needed. 2. Diverticulitis at the descending/sigmoid junction. 3. 3.8 cm fluid collection posteromedial to the colonic process, favor adnexal cyst or lymphocele over abscess. 4. Hiatal hernia.   Electronically Signed   By: D Lucrezia Europe.D.   On: 05/03/2015 20:28   [facility]  05/04/2015   INDICATION: Patient admitted with diverticulitis, now with ill-defined infiltrative mass involving the caudal aspect of the right lobe of the liver worrisome for either a primary hepatic malignancy or metastatic disease. Patient with history of remote breast cancer (greater than 50 years ago). Additional ultrasound-guided  biopsy for tissue diagnostic purposes.  EXAM: ULTRASOUND GUIDED LIVER LESION BIOPSY  COMPARISON:  CT abdomen and pelvis - 05/03/2015  MEDICATIONS: Fentanyl 50 mcg IV; Versed 1 mg IV  ANESTHESIA/SEDATION: Total Moderate Sedation time  18 minutes  COMPLICATIONS: None immediate  PROCEDURE: Informed written consent was obtained from the patient after a discussion of the risks, benefits and alternatives to treatment. The patient understands and consents the procedure. A timeout was performed prior to the initiation of the procedure.  Ultrasound scanning was performed of the right upper  abdominal quadrant demonstrates an ill-defined infiltrative mixed echogenic mass within the caudal aspect of the right lobe of the liver compatible with the infiltrative mass seen on preceding abdominal CT. The caudal, slightly exophytic lobular aspect of the hepatic mass was targeted for biopsy. The procedure was planned.  The right upper abdominal quadrant was prepped and draped in the usual sterile fashion. The overlying soft tissues were anesthetized with 1% lidocaine with epinephrine. A 17 gauge, 6.8 cm co-axial needle was advanced into a peripheral aspect of the lesion. This was followed by 6 core biopsies with an 18 gauge core device under direct ultrasound guidance.  The coaxial needle tract was embolized with a small amount of Gel-Foam slurry and superficial hemostasis was obtained with manual compression. Post procedural scanning was negative for definitive area of hemorrhage or additional complication. A dressing was placed. The patient tolerated the procedure well without immediate post procedural complication.  IMPRESSION: Technically successful ultrasound guided core needle biopsy of infiltrative mass involving the caudal aspect of the right lobe of the liver.   Electronically Signed   By: Sandi Mariscal M.D.   On: 05/04/2015 16:22    Scheduled Meds: . ciprofloxacin  400 mg Intravenous Q12H  . feeding supplement  1 Container Oral TID BM  . levothyroxine  100 mcg Oral QAC breakfast  . losartan  50 mg Oral Daily  . metronidazole  500 mg Intravenous 3 times per day  . simvastatin  40 mg Oral Q48H   Continuous Infusions:    Principal Problem:   Diverticulitis Active Problems:   Hyperlipidemia   Hypothyroidism   Essential hypertension   Liver mass   Diverticulitis of large intestine without perforation or abscess without bleeding    Time spent: [place], [place] [facility] Pager [phone]. If 7PM-7AM, please contact night-coverage at [email or web address], password  [facility] 05/05/2015, 12:37 PM  LOS: 2 days

## 2015-05-05 NOTE — Progress Notes (Signed)
INFECTIOUS DISEASE PROGRESS NOTE  ID: Destiny Velasquez is a 81 y.o. female with  Principal Problem:   Diverticulitis Active Problems:   Hyperlipidemia   Hypothyroidism   Essential hypertension   Liver mass   Diverticulitis of large intestine without perforation or abscess without bleeding  Subjective: C/o anorexia, nausea.   Abtx:  Anti-infectives    Start     Dose/Rate Route Frequency Ordered Stop   05/04/15 1200  ciprofloxacin (CIPRO) IVPB 400 mg     400 mg 200 mL/hr over 60 Minutes Intravenous Every 12 hours 05/04/15 0200     05/04/15 0400  metroNIDAZOLE (FLAGYL) IVPB 500 mg     500 mg 100 mL/hr over 60 Minutes Intravenous 3 times per day 05/04/15 0140     05/04/15 0200  metroNIDAZOLE (FLAGYL) IVPB 500 mg  Status:  Discontinued     500 mg 100 mL/hr over 60 Minutes Intravenous 3 times per day 05/04/15 0125 05/04/15 0140   05/03/15 2045  ciprofloxacin (CIPRO) IVPB 400 mg    Comments:  Diverticulitis   400 mg 200 mL/hr over 60 Minutes Intravenous  Once 05/03/15 2038 05/04/15 0205   05/03/15 2045  metroNIDAZOLE (FLAGYL) IVPB 500 mg     500 mg 100 mL/hr over 60 Minutes Intravenous  Once 05/03/15 2038 05/03/15 2150      Medications:  Scheduled: . ciprofloxacin  400 mg Intravenous Q12H  . feeding supplement  1 Container Oral TID BM  . levothyroxine  100 mcg Oral QAC breakfast  . losartan  50 mg Oral Daily  . metronidazole  500 mg Intravenous 3 times per day  . simvastatin  40 mg Oral Q48H    Objective: Vital signs in last 24 hours: Temp:  [97.8 F (36.6 C)-98.4 F (36.9 C)] 98.1 F (36.7 C) (09/22 1000) Pulse Rate:  [69-88] 76 (09/22 0900) Resp:  [10-20] 10 (09/22 0900) BP: (107-130)/(38-60) 116/60 mmHg (09/22 0900) SpO2:  [95 %-100 %] 98 % (09/22 1000)   General appearance: alert, cooperative and moderate distress Resp: clear to auscultation bilaterally Cardio: regular rate and rhythm GI: normal findings: bowel sounds normal and soft, non-tender  Lab  Results  Recent Labs  05/03/15 1700 05/04/15 0429 05/05/15 0651  WBC 15.0* 15.1* 10.9*  HGB 14.0 12.6 11.6*  HCT 41.7 37.8 34.3*  NA 135 133*  --   K 3.6 3.6  --   CL 98* 100*  --   CO2 26 23  --   BUN 19 14  --   CREATININE 1.17* 1.20*  --    Liver Panel  Recent Labs  05/03/15 1700 05/04/15 0429  PROT 7.4 6.5  ALBUMIN 3.6 2.8*  AST 27 47*  ALT 19 25  ALKPHOS 68 63  BILITOT 0.7 0.6   Sedimentation Rate No results for input(s): ESRSEDRATE in the last 72 hours. C-Reactive Protein No results for input(s): CRP in the last 72 hours.  Microbiology: No results found for this or any previous visit (from the past 240 hour(s)).  Studies/Results: Dg Abd 1 View  05/03/2015   CLINICAL DATA:  LBM was 3 days ago. Tried an enema but that did not help much. Having discomfort in her abdomen  EXAM: ABDOMEN - 1 VIEW  COMPARISON:  03/15/2011  FINDINGS: Normal bowel gas pattern.  Stable right upper quadrant 5 mm peripheral calcification. Probable splenic calcifications in the left upper abdomen. Surgical clips right upper abdomen.  Regional bones unremarkable.  IMPRESSION: Normal bowel gas pattern.   Electronically Signed  By: Lucrezia Europe M.D.   On: 05/03/2015 17:06   Ct Abdomen Pelvis W Contrast  05/03/2015   CLINICAL DATA:  Pt co LLQ pain x 1 day, pt states right breast ca x 50 years ago, right mastectomy, cholecystectomy  EXAM: CT ABDOMEN AND PELVIS WITH CONTRAST  TECHNIQUE: Multidetector CT imaging of the abdomen and pelvis was performed using the standard protocol following bolus administration of intravenous contrast.  CONTRAST:  55m OMNIPAQUE IOHEXOL 300 MG/ML SOLN, 793mOMNIPAQUE IOHEXOL 300 MG/ML SOLN  COMPARISON:  None.  FINDINGS: Visualized lung bases clear.  Moderate hiatal hernia.  Poorly marginated bilobed 8.1 cm masslike area in hepatic segment 5 containing multiple low-attenuation regions centrally.  Surgical clips in the gallbladder fossa. Scattered punctate splenic calcified  granulomas. Unremarkable adrenal glands, kidneys. Mild pancreatic parenchymal atrophy. Heavy aortoiliac arterial calcifications without aneurysm. Portal vein patent. Stomach, small bowel, and colon are nondilated. Multiple diverticula from the proximal sigmoid colon with eccentric wall thickening at the descending/sigmoid junction with mild adjacent inflammatory/edematous changes. There is adjacent 3.8 cm loculated fluid attenuation collection medial to the proximal external iliac vessels, with a thin well-defined wall and no significant adjacent inflammatory/ edematous changes away from the colon. There is a small amount of free fluid in the cul-de-sac. Urinary bladder incompletely distended. Bilateral pelvic phleboliths. No free air. Degenerative changes in the lower lumbar spine. Advanced facet disease and degenerative disc disease L4-5 allows grade 1/2 anterolisthesis ; no pars defect.  IMPRESSION: 1. Complex hepatic segment 5 mass may represent developing abscess versus primary or secondary neoplasm. This should be approachable for percutaneous aspiration/ biopsy if needed. 2. Diverticulitis at the descending/sigmoid junction. 3. 3.8 cm fluid collection posteromedial to the colonic process, favor adnexal cyst or lymphocele over abscess. 4. Hiatal hernia.   Electronically Signed   By: D Lucrezia Europe.D.   On: 05/03/2015 20:28   [facility]  05/04/2015   INDICATION: Patient admitted with diverticulitis, now with ill-defined infiltrative mass involving the caudal aspect of the right lobe of the liver worrisome for either a primary hepatic malignancy or metastatic disease. Patient with history of remote breast cancer (greater than 50 years ago). Additional ultrasound-guided biopsy for tissue diagnostic purposes.  EXAM: ULTRASOUND GUIDED LIVER LESION BIOPSY  COMPARISON:  CT abdomen and pelvis - 05/03/2015  MEDICATIONS: Fentanyl 50 mcg IV; Versed 1 mg IV  ANESTHESIA/SEDATION: Total Moderate Sedation time  18 minutes   COMPLICATIONS: None immediate  PROCEDURE: Informed written consent was obtained from the patient after a discussion of the risks, benefits and alternatives to treatment. The patient understands and consents the procedure. A timeout was performed prior to the initiation of the procedure.  Ultrasound scanning was performed of the right upper abdominal quadrant demonstrates an ill-defined infiltrative mixed echogenic mass within the caudal aspect of the right lobe of the liver compatible with the infiltrative mass seen on preceding abdominal CT. The caudal, slightly exophytic lobular aspect of the hepatic mass was targeted for biopsy. The procedure was planned.  The right upper abdominal quadrant was prepped and draped in the usual sterile fashion. The overlying soft tissues were anesthetized with 1% lidocaine with epinephrine. A 17 gauge, 6.8 cm co-axial needle was advanced into a peripheral aspect of the lesion. This was followed by 6 core biopsies with an 18 gauge core device under direct ultrasound guidance.  The coaxial needle tract was embolized with a small amount of Gel-Foam slurry and superficial hemostasis was obtained with manual compression. Post procedural scanning was  negative for definitive area of hemorrhage or additional complication. A dressing was placed. The patient tolerated the procedure well without immediate post procedural complication.  IMPRESSION: Technically successful ultrasound guided core needle biopsy of infiltrative mass involving the caudal aspect of the right lobe of the liver.   Electronically Signed   By: Sandi Mariscal M.D.   On: 05/04/2015 16:22     Assessment/Plan: Liver Mass Await results of Bx  Diverticulitis Would continue cipro/flagyl as needed for this, her liver mass will predominate her care.  Watch her clinical status wbc improved  Adnexal mass or lymphocele or abscess  Could this be the primary?  Total days of  antibiotics: 1 cipro/flagyl        Available as needed   [name] Infectious Diseases (pager) [phone] [email or web address] 05/05/2015, 1:36 PM  LOS: 2 days

## 2015-05-06 ENCOUNTER — Telehealth: Payer: Self-pay

## 2015-05-06 DIAGNOSIS — E785 Hyperlipidemia, unspecified: Secondary | ICD-10-CM

## 2015-05-06 DIAGNOSIS — R634 Abnormal weight loss: Secondary | ICD-10-CM | POA: Diagnosis present

## 2015-05-06 LAB — GLUCOSE, CAPILLARY
GLUCOSE-CAPILLARY: 109 mg/dL — AB (ref 65–99)
Glucose-Capillary: 125 mg/dL — ABNORMAL HIGH (ref 65–99)

## 2015-05-06 MED ORDER — CIPROFLOXACIN HCL 500 MG PO TABS: 500.0000 mg | ORAL_TABLET | Freq: Two times a day (BID) | ORAL | Status: AC

## 2015-05-06 MED ORDER — BOOST / RESOURCE BREEZE PO LIQD: 1.0000 | Freq: Three times a day (TID) | ORAL | Status: DC

## 2015-05-06 MED ORDER — METRONIDAZOLE 500 MG PO TABS: 500.0000 mg | ORAL_TABLET | Freq: Three times a day (TID) | ORAL | Status: DC

## 2015-05-06 NOTE — Progress Notes (Signed)
Patient and family received discharge orders and education, All questions addressed. IV dc'd. Vitals stable for pt. Discharged to home. 05/06/2015 11:46 AM Bowie,Sharlene

## 2015-05-06 NOTE — Care Management Important Message (Signed)
Important Message  Patient Details  Name: Destiny Velasquez MRN: [id number] Date of Birth: 1926/01/24   Medicare Important Message Given:  Yes-second notification given    Loann Quill 05/06/2015, 11:14 AM

## 2015-05-06 NOTE — Discharge Summary (Signed)
Physician Discharge Summary  Destiny Velasquez QMV:[id number] DOB: Nov 19, 1925 DOA: 05/03/2015  PCP: Hollace Kinnier, DO  Admit date: 05/03/2015 Discharge date: 05/06/2015  Time spent: 35 minutes  Recommendations for Outpatient Follow-up:  #1 Discharge home with outpatient PCP follow-up in 1 week. Patient will complete a ten-day course of antibiotics (Cipro and Flagyl) on 9/30. #2 please follow final liver biopsy result. #3 oncology (Dr. Lindi Adie) we'll arrange for outpatient follow-up in 1-2 weeks.  Discharge Diagnoses:  Principal Problem:   Diverticulitis of large intestine without perforation or abscess without bleeding   Active Problems:   Malignant liver mass   Hyperlipidemia   Hypothyroidism   Essential hypertension   Unintentional weight loss   Discharge Condition: Fair  Diet recommendation: Regular  Filed Weights   05/03/15 1630 05/03/15 2235 05/04/15 0622  Weight: 68.947 kg (152 lb) 69.3 kg (152 lb 12.5 oz) 63.3 kg (139 lb 8.8 oz)    History of present illness:  Please refer to admission H&P for details, in brief,81 year old female with history of hypertension, hyperlipidemia, hypothyroidism presented to the ED with lower abdominal pain for past 10-14 days worsened on the day of admission. Denies any nausea, vomiting or diarrhea. CT abdomen and pelvis done in the ED showed sigmoid diverticulitis with 3.8 cm pericolonic fluid collection. Also showed an incidental complex hepatic segment mass measuring 8.1 cm.  Hospital Course:  Sigmoid diverticulitis/descending colon junction Placed on empiric IV Cipro and Flagyl.  CT scan shows a fluid collection posterior and medial to the colonic process, which per radiologist is more likely a lymphocele or cyst versus abscess. Admitting hospitalist is discussed with on-call surgeon who recommended repeat CT scan of abdomen and pelvis in 2 days. Given her clinical improvement does not need further CT scan. Patient tolerating advanced diet.  Abdominal pain has resolved. She will be discharged on oral Cipro and Flagyl to complete a total ten-day course of antibiotics. -Follow-up with PCP  in 1-2 weeks.  Liver mass Initially appeared to be a liver abscess but on biopsy it looked like an infiltrative mass. Sent for surgical pathology. Discussed with pathologist ( Dr Saralyn Pilar) today. Commends that this is definitely a malignant lesion unlikely for a liver primary. ER, PR  negative which suggests against breast primary. Final biopsy result pending. Possible for GI versus urothelial primary. I have spoken with oncologist Dr. Lindi Adie who will arrange outpatient follow-up to see the patient in 1-2 weeks  Essential hypertension Resume home medications.  Hypothyroidism Continue Synthroid  Chronic kidney disease stage III Renal function at baseline  Hyperlipidemia Resume statin  Inadequate by mouth intake. Added supplements.    Code Status: Full code Family Communication: Daughters at bedside Disposition Plan: Home    Consultants:  IR  ID  Procedures:  CT abdomen and pelvis  Liver mass biopsy  Antibiotics:  IV Cipro and Flagyl  Discharge Exam: Filed Vitals:   05/06/15 1003  BP: 101/62  Pulse: 80  Temp: 98.1 F (36.7 C)  Resp: 20    General: Elderly female in no acute distress HEENT: No pallor, no icterus, moist oral mucosa, supple neck Chest: Clear to auscultation bilaterally, no added sounds CVS: Normal S1 and S2, no murmurs rub or gallop  GI: Soft, nondistended, nontender, bowel sounds present Skin musculoskeletal: Warm, no edema CNS: Alert and oriented  Discharge Instructions    Current Discharge Medication List    START taking these medications   Details  ciprofloxacin (CIPRO) 500 MG tablet Take 1 tablet (500 mg total) by  mouth 2 (two) times daily. Qty: 16 tablet, Refills: 0    feeding supplement (BOOST / RESOURCE BREEZE) LIQD Take 1 Container by mouth 3 (three) times daily between  meals. Qty: 90 Container, Refills: 0    metroNIDAZOLE (FLAGYL) 500 MG tablet Take 1 tablet (500 mg total) by mouth 3 (three) times daily. Qty: 24 tablet, Refills: 0      CONTINUE these medications which have NOT CHANGED   Details  !! estrogens, conjugated, (PREMARIN) 0.3 MG tablet Take one tablet every other day for one month Qty: 15 tablet, Refills: 5   Associated Diagnoses: Vulvovaginal dryness    simvastatin (ZOCOR) 40 MG tablet Take 1 tablet (40 mg total) by mouth every other day. Qty: 15 tablet, Refills: 3   Associated Diagnoses: Hyperlipidemia; Statin intolerance    Sodium Phosphates (ENEMA RE) Place rectally.    aspirin 81 MG tablet Take 81 mg by mouth daily.    beta carotene w/minerals (OCUVITE) tablet Take 1 tablet by mouth daily.    Cholecalciferol (VITAMIN D) 2000 UNITS tablet Take 2,000 Units by mouth daily.    levothyroxine (SYNTHROID, LEVOTHROID) 100 MCG tablet TAKE ONE TABLET BY MOUTH 30 MINUTES BEFORE BREAKFAST EVERY MORNING. SEPARATE FROM OTHER MEDICATIONS FOR THYROID. Qty: 60 tablet, Refills: 5    losartan (COZAAR) 50 MG tablet Take one tablet by mouth once daily for blood pressure Qty: 90 tablet, Refills: 1    niacin 500 MG tablet Take 500 mg by mouth daily with breakfast.    !! PREMARIN 0.3 MG tablet TAKE 1 TABLET(S) BY MOUTH EVERY OTHER DAY Qty: 15 tablet, Refills: 4    spironolactone-hydrochlorothiazide (ALDACTAZIDE) 25-25 MG per tablet TAKE 1/2 TABLET BY MOUTH ONCE DAILY Qty: 45 tablet, Refills: 5     !! - Potential duplicate medications found. Please discuss with provider.     No Known Allergies Follow-up Information    Follow up with [name], [name], DO In 2 weeks.   Specialty:  Geriatric Medicine   Contact information:   [address] [place] [address] [phone]       Follow up with Rulon Eisenmenger, MD. Call in 2 weeks.   Specialty:  Hematology and Oncology   Contact information:   [place]  [id number] [phone]        The results of significant diagnostics from this hospitalization (including imaging, microbiology, ancillary and laboratory) are listed below for reference.    Significant Diagnostic Studies: Dg Abd 1 View  05/03/2015   CLINICAL DATA:  LBM was 3 days ago. Tried an enema but that did not help much. Having discomfort in her abdomen  EXAM: ABDOMEN - 1 VIEW  COMPARISON:  03/15/2011  FINDINGS: Normal bowel gas pattern.  Stable right upper quadrant 5 mm peripheral calcification. Probable splenic calcifications in the left upper abdomen. Surgical clips right upper abdomen.  Regional bones unremarkable.  IMPRESSION: Normal bowel gas pattern.   Electronically Signed   By: Lucrezia Europe M.D.   On: 05/03/2015 17:06   Ct Abdomen Pelvis W Contrast  05/03/2015   CLINICAL DATA:  Pt co LLQ pain x 1 day, pt states right breast ca x 50 years ago, right mastectomy, cholecystectomy  EXAM: CT ABDOMEN AND PELVIS WITH CONTRAST  TECHNIQUE: Multidetector CT imaging of the abdomen and pelvis was performed using the standard protocol following bolus administration of intravenous contrast.  CONTRAST:  68m OMNIPAQUE IOHEXOL 300 MG/ML SOLN, 730mOMNIPAQUE IOHEXOL 300 MG/ML SOLN  COMPARISON:  None.  FINDINGS: Visualized lung bases clear.  Moderate hiatal hernia.  Poorly marginated bilobed 8.1 cm masslike area in hepatic segment 5 containing multiple low-attenuation regions centrally.  Surgical clips in the gallbladder fossa. Scattered punctate splenic calcified granulomas. Unremarkable adrenal glands, kidneys. Mild pancreatic parenchymal atrophy. Heavy aortoiliac arterial calcifications without aneurysm. Portal vein patent. Stomach, small bowel, and colon are nondilated. Multiple diverticula from the proximal sigmoid colon with eccentric wall thickening at the descending/sigmoid junction with mild adjacent inflammatory/edematous changes. There is adjacent 3.8 cm loculated fluid attenuation collection  medial to the proximal external iliac vessels, with a thin well-defined wall and no significant adjacent inflammatory/ edematous changes away from the colon. There is a small amount of free fluid in the cul-de-sac. Urinary bladder incompletely distended. Bilateral pelvic phleboliths. No free air. Degenerative changes in the lower lumbar spine. Advanced facet disease and degenerative disc disease L4-5 allows grade 1/2 anterolisthesis ; no pars defect.  IMPRESSION: 1. Complex hepatic segment 5 mass may represent developing abscess versus primary or secondary neoplasm. This should be approachable for percutaneous aspiration/ biopsy if needed. 2. Diverticulitis at the descending/sigmoid junction. 3. 3.8 cm fluid collection posteromedial to the colonic process, favor adnexal cyst or lymphocele over abscess. 4. Hiatal hernia.   Electronically Signed   By: Lucrezia Europe M.D.   On: 05/03/2015 20:28   US Biopsy  05/04/2015   INDICATION: Patient admitted with diverticulitis, now with ill-defined infiltrative mass involving the caudal aspect of the right lobe of the liver worrisome for either a primary hepatic malignancy or metastatic disease. Patient with history of remote breast cancer (greater than 50 years ago). Additional ultrasound-guided biopsy for tissue diagnostic purposes.  EXAM: ULTRASOUND GUIDED LIVER LESION BIOPSY  COMPARISON:  CT abdomen and pelvis - 05/03/2015  MEDICATIONS: Fentanyl 50 mcg IV; Versed 1 mg IV  ANESTHESIA/SEDATION: Total Moderate Sedation time  18 minutes  COMPLICATIONS: None immediate  PROCEDURE: Informed written consent was obtained from the patient after a discussion of the risks, benefits and alternatives to treatment. The patient understands and consents the procedure. A timeout was performed prior to the initiation of the procedure.  Ultrasound scanning was performed of the right upper abdominal quadrant demonstrates an ill-defined infiltrative mixed echogenic mass within the caudal aspect  of the right lobe of the liver compatible with the infiltrative mass seen on preceding abdominal CT. The caudal, slightly exophytic lobular aspect of the hepatic mass was targeted for biopsy. The procedure was planned.  The right upper abdominal quadrant was prepped and draped in the usual sterile fashion. The overlying soft tissues were anesthetized with 1% lidocaine with epinephrine. A 17 gauge, 6.8 cm co-axial needle was advanced into a peripheral aspect of the lesion. This was followed by 6 core biopsies with an 18 gauge core device under direct ultrasound guidance.  The coaxial needle tract was embolized with a small amount of Gel-Foam slurry and superficial hemostasis was obtained with manual compression. Post procedural scanning was negative for definitive area of hemorrhage or additional complication. A dressing was placed. The patient tolerated the procedure well without immediate post procedural complication.  IMPRESSION: Technically successful ultrasound guided core needle biopsy of infiltrative mass involving the caudal aspect of the right lobe of the liver.   Electronically Signed   By: Sandi Mariscal M.D.   On: 05/04/2015 16:22    Microbiology: Recent Results (from the past 240 hour(s))  Blood culture (routine x 2)     Status: None (Preliminary result)   Collection Time: 05/03/15  9:25 PM  Result Value Ref Range Status   Specimen Description BLOOD RIGHT ARM  Final   Special Requests BOTTLES DRAWN AEROBIC AND ANAEROBIC 5CC EACH  Final   Culture   Final    NO GROWTH 1 DAY Performed at [facility]    Report Status PENDING  Incomplete  Blood culture (routine x 2)     Status: None (Preliminary result)   Collection Time: 05/03/15  9:30 PM  Result Value Ref Range Status   Specimen Description BLOOD LEFT ARM  Final   Special Requests BOTTLES DRAWN AEROBIC AND ANAEROBIC 5CC EACH  Final   Culture   Final    NO GROWTH 1 DAY Performed at [facility]    Report Status PENDING   Incomplete     Labs: Basic Metabolic Panel:  Recent Labs Lab 05/03/15 1700 05/04/15 0429  NA 135 133*  K 3.6 3.6  CL 98* 100*  CO2 26 23  GLUCOSE 137* 150*  BUN 19 14  CREATININE 1.17* 1.20*  CALCIUM 10.0 9.4   Liver Function Tests:  Recent Labs Lab 05/03/15 1700 05/04/15 0429  AST 27 47*  ALT 19 25  ALKPHOS 68 63  BILITOT 0.7 0.6  PROT 7.4 6.5  ALBUMIN 3.6 2.8*   No results for input(s): LIPASE, AMYLASE in the last 168 hours. No results for input(s): AMMONIA in the last 168 hours. CBC:  Recent Labs Lab 05/03/15 1700 05/04/15 0429 05/05/15 0651  WBC 15.0* 15.1* 10.9*  NEUTROABS  --  12.6*  --   HGB 14.0 12.6 11.6*  HCT 41.7 37.8 34.3*  MCV 85.3 84.2 85.8  PLT 282 247 197   Cardiac Enzymes: No results for input(s): CKTOTAL, CKMB, CKMBINDEX, TROPONINI in the last 168 hours. BNP: BNP (last 3 results) No results for input(s): BNP in the last 8760 hours.  ProBNP (last 3 results) No results for input(s): PROBNP in the last 8760 hours.  CBG:  Recent Labs Lab 05/05/15 0609 05/05/15 1233 05/05/15 1819 05/06/15 0041 05/06/15 0701  GLUCAP 124* 139* 122* 125* 109*       Signed:  DHUNGEL, NISHANT  [facility] 05/06/2015, 11:17 AM

## 2015-05-06 NOTE — Discharge Instructions (Signed)
Diverticulitis °Diverticulitis is when small pockets that have formed in your colon (large intestine) become infected or swollen. °HOME CARE °· Follow your doctor's instructions. °· Follow a special diet if told by your doctor. °· When you feel better, your doctor may tell you to change your diet. You may be told to eat a lot of fiber. Fruits and vegetables are good sources of fiber. Fiber makes it easier to poop (have bowel movements). °· Take supplements or probiotics as told by your doctor. °· Only take medicines as told by your doctor. °· Keep all follow-up visits with your doctor. °GET HELP IF: °· Your pain does not get better. °· You have a hard time eating food. °· You are not pooping like normal. °GET HELP RIGHT AWAY IF: °· Your pain gets worse. °· Your problems do not get better. °· Your problems suddenly get worse. °· You have a fever. °· You keep throwing up (vomiting). °· You have bloody or black, tarry poop (stool). °MAKE SURE YOU:  °· Understand these instructions. °· Will watch your condition. °· Will get help right away if you are not doing well or get worse. °Document Released: 01/16/2008 Document Revised: 08/04/2013 Document Reviewed: 06/24/2013 °Exit[employer]® Patient Information ©2015 [employer]. This information is not intended to replace advice given to you by your health care provider. Make sure you discuss any questions you have with your health care provider. ° °

## 2015-05-06 NOTE — Telephone Encounter (Signed)
Referral rcvd by Dr Lindi Adie to see patient next week for liver mass.  Inbasket sent to [name].  [place] notified.

## 2015-05-06 NOTE — Care Management Note (Signed)
Case Management Note  Patient Details  Name: Destiny Velasquez MRN: [id number] Date of Birth: [birth year]  Subjective/Objective:                  Date: 05-04-15 Wednesday Spoke with patient at the bedside. Introduced self as Tourist information centre manager and explained role in discharge planning and how to be reached. Verified patient lives in [place], in a one story home, with spouse who is 81 years old who is in overall good health and able to perform ADL's, has DME cane. Expressed potential need for no other DME. Verified patient anticipates to go home with family, at time of discharge and will have part-time supervision by family, friends neighbors at this time to best of their knowledge. Patient denied needing help with their medication, gets it at [facility] and is "pretty well stocked up." Patient drives to MD appointments, however expressed need for transportation home from hospital, SW consult placed.. Verified patient has PCP Dr Mariea Clonts at [facility]. Patient was provided choice and selected [facility] for home health needs if they arise.  Plan: CM will continue to follow for discharge planning and [facility] resources.   Carles Collet RN BSN CM [phone]   Action/Plan:  No HH needs at this time, patient to discharge to home, self care today.   Expected Discharge Date:                  Expected Discharge Plan:  Home/Self Care  In-House Referral:     Discharge planning Services  CM Consult  Post Acute Care Choice:    Choice offered to:     DME Arranged:    DME Agency:     HH Arranged:    [place] Agency:     Status of Service:  Completed, signed off  Medicare Important Message Given:  Yes-second notification given Date Medicare IM Given:    Medicare IM give by:    Date Additional Medicare IM Given:    Additional Medicare Important Message give by:     If discussed at [employer] Meetings, dates discussed:    Additional Comments:  Carles Collet, RN 05/06/2015, 11:40 AM

## 2015-05-09 ENCOUNTER — Other Ambulatory Visit: Payer: Self-pay | Admitting: Hematology and Oncology

## 2015-05-09 ENCOUNTER — Telehealth: Payer: Self-pay | Admitting: Internal Medicine

## 2015-05-09 ENCOUNTER — Telehealth: Payer: Self-pay | Admitting: Hematology and Oncology

## 2015-05-09 ENCOUNTER — Telehealth: Payer: Self-pay | Admitting: *Deleted

## 2015-05-09 DIAGNOSIS — R160 Hepatomegaly, not elsewhere classified: Secondary | ICD-10-CM

## 2015-05-09 LAB — CULTURE, BLOOD (ROUTINE X 2)
CULTURE: NO GROWTH
Culture: NO GROWTH

## 2015-05-09 MED ORDER — ONDANSETRON HCL 4 MG PO TABS: 4.0000 mg | ORAL_TABLET | Freq: Three times a day (TID) | ORAL | Status: DC | PRN

## 2015-05-09 MED ORDER — LORAZEPAM 0.5 MG PO TABS: 0.5000 mg | ORAL_TABLET | Freq: Four times a day (QID) | ORAL | Status: DC | PRN

## 2015-05-09 NOTE — Telephone Encounter (Signed)
PLEASE CONTACT PT.'S Judie Bonus, AT [phone] CONCERNING ANY PT. INFORMATION AND APPOINTMENTS. NOTIFIED [name] MCRAE.

## 2015-05-09 NOTE — Telephone Encounter (Signed)
new patient appt-s/w patient dtr [name] and gave np appt for 10/03 @ 3:15 w/Dr. Lindi Adie.

## 2015-05-09 NOTE — Telephone Encounter (Signed)
Pt's daughter, [name], a physician herself, called me this evening when I was on call.  She was asking if the pathology results had returned on her mom's liver mass--I have not seen these yet.  She also requested some zofran for her mother's nausea--she apparently vomited her cipro this am and has been having difficulty taking po in the mornings.  She's also been refusing phenergan.  [name] also mentions that Mrs. Shimp has been extremely anxious and agitated--is not handling her illness well.  She believes [place] likely has generalized anxiety disorder.  I will also call in some ativan for her given her severe anxiety as she awaits her path results.

## 2015-05-11 ENCOUNTER — Telehealth: Payer: Self-pay | Admitting: Hematology and Oncology

## 2015-05-11 NOTE — Telephone Encounter (Signed)
Patient dtr called to r/s appt to 10/04 @ 3:45 w/Dr. Lindi Adie

## 2015-05-11 NOTE — Telephone Encounter (Signed)
Attempted to reach [facility] by phone last evening (message left) and called again today--no answer on cell or on home phone.  Wanted to provide path results.

## 2015-05-12 ENCOUNTER — Ambulatory Visit: Payer: Medicare Other | Admitting: Internal Medicine

## 2015-05-16 ENCOUNTER — Ambulatory Visit (INDEPENDENT_AMBULATORY_CARE_PROVIDER_SITE_OTHER): Payer: Medicare Other | Admitting: Internal Medicine

## 2015-05-16 ENCOUNTER — Ambulatory Visit: Payer: Medicare Other | Admitting: Hematology and Oncology

## 2015-05-16 ENCOUNTER — Encounter: Payer: Self-pay | Admitting: Internal Medicine

## 2015-05-16 VITALS — BP 122/64 | HR 76 | Temp 99.3°F | Wt 151.0 lb

## 2015-05-16 DIAGNOSIS — K5900 Constipation, unspecified: Secondary | ICD-10-CM | POA: Diagnosis not present

## 2015-05-16 DIAGNOSIS — K5909 Other constipation: Secondary | ICD-10-CM

## 2015-05-16 DIAGNOSIS — F411 Generalized anxiety disorder: Secondary | ICD-10-CM | POA: Diagnosis not present

## 2015-05-16 DIAGNOSIS — R160 Hepatomegaly, not elsewhere classified: Secondary | ICD-10-CM | POA: Diagnosis not present

## 2015-05-16 DIAGNOSIS — K5732 Diverticulitis of large intestine without perforation or abscess without bleeding: Secondary | ICD-10-CM

## 2015-05-16 NOTE — Progress Notes (Signed)
Patient ID: Destiny Velasquez, female   DOB: 09-07-1925, 81 y.o.   MRN: [id number]   Location:  [facility]  Code Status: DNR Goals of Care: Advanced Directive information Type of Advance Directive: Healthcare Power of Attorney Pt has always been clear about her code status when asked.  Chief Complaint  Patient presents with  . Hospitalization Follow-up    05/03/15 to 05/06/15 for diverticulitis .  Here with daughter Destiny Velasquez    HPI: Patient is a 81 y.o. white female seen in the office today for a hospital f/u.  Hospital records were reviewed including labs, studies, discharge summary, vitals.  There is a weight from 9/21 which must have been an error at 139 lbs.  Finished her cipro and flagyl.  Has not been eating much or having many bms.  Finished course yesterday.  Is starting to have some bms--small x 4 yesterday, 3 larger today.   Does not think she ate at all at the hospital.  Was having to stand up to swallow her pills.  Was taking zofran to help.   Was told she had diverticuli.  They thought she might require drainage of an abscess but resolved.  T99.3 today, but came down to 98.1.    Is aware of a lesion on her liver near the colon (right lobe).  Has PET scan at 1pm tomorrow and sees Dr. Lindi Adie after that at oncology.  Briefly discussed liver carcinoma.  Reviewed pathology with pt and her daughter Destiny Velasquez--?primary hepatocellular ca vs. Metastatic breast ca.     Anxiety:  Other daughter, [name], from [place] was looking after her during the hospitalization.  I called Destiny Velasquez back about pathology, but did not hear back.  Pt does not want to take ativan saying Marlowe Kays was the cause of her bad nerves when she was in town.  Destiny Velasquez does not seem highly anxious today actually.    Review of Systems:  Review of Systems  Constitutional: Positive for weight loss and malaise/fatigue. Negative for fever and chills.  HENT: Negative for congestion.     Eyes: Negative for blurred vision.  Respiratory: Negative for cough and shortness of breath.   Cardiovascular: Negative for chest pain.  Gastrointestinal: Positive for constipation. Negative for blood in stool.       Hemorrhoids; abdominal pain better and bowels are moving better  Genitourinary: Negative for dysuria, urgency and frequency.  Musculoskeletal: Negative for falls.  Skin: Negative for rash.  Neurological: Positive for weakness. Negative for dizziness.  Endo/Heme/Allergies: Bruises/bleeds easily.  Psychiatric/Behavioral: Positive for memory loss. The patient is nervous/anxious.        Mild cognitive impairment    Past Medical History  Diagnosis Date  . Hypertension   . Hyperlipidemia   . Thyroid disease   . Subacute delirium   . Essential and other specified forms of tremor   . Unspecified hypothyroidism   . Diverticulosis of colon (without mention of hemorrhage)   . Unspecified constipation   . Anal and rectal polyp   . Disorder of bone and cartilage, unspecified   . Malignant neoplasm of breast (female), unspecified site   . Breast cancer, right breast ([place]) 1971    "never had chemo or radiation"  . Complication of anesthesia     "they have a hard time waking me up afterwards; it takes me a long time to recover"  . Pneumonia X 1  . Arthritis     "touch in my hands for a  while; it went away" (05/03/2015)    Past Surgical History  Procedure Laterality Date  . Appendectomy    . Abdominal hysterectomy    . Tonsillectomy and adenoidectomy  1935  . Cholecystectomy    . Breast biopsy Right 1971  . Mastectomy Right 1971  . Tubal ligation    . Cataract extraction w/ intraocular lens  implant, bilateral Bilateral   . Rectal polypectomy  early 2000's    No Known Allergies Medications: Patient's Medications  New Prescriptions   No medications on file  Previous Medications   ASPIRIN 81 MG TABLET    Take 81 mg by mouth daily.   BETA CAROTENE W/MINERALS  (OCUVITE) TABLET    Take 1 tablet by mouth daily.   CHOLECALCIFEROL (VITAMIN D) 2000 UNITS TABLET    Take 2,000 Units by mouth daily.   ESTROGENS, CONJUGATED, (PREMARIN) 0.3 MG TABLET    Take one tablet every other day for one month   FEEDING SUPPLEMENT (BOOST / RESOURCE BREEZE) LIQD    Take 1 Container by mouth 3 (three) times daily between meals.   LEVOTHYROXINE (SYNTHROID, LEVOTHROID) 100 MCG TABLET    TAKE ONE TABLET BY MOUTH 30 MINUTES BEFORE BREAKFAST EVERY MORNING. SEPARATE FROM OTHER MEDICATIONS FOR THYROID.   LORAZEPAM (ATIVAN) 0.5 MG TABLET    Take 1 tablet (0.5 mg total) by mouth every 6 (six) hours as needed for anxiety.   LOSARTAN (COZAAR) 50 MG TABLET    Take one tablet by mouth once daily for blood pressure   NIACIN 500 MG TABLET    Take 500 mg by mouth daily with breakfast.   ONDANSETRON (ZOFRAN) 4 MG TABLET    Take 1 tablet (4 mg total) by mouth every 8 (eight) hours as needed for nausea or vomiting.   PREMARIN 0.3 MG TABLET    TAKE 1 TABLET(S) BY MOUTH EVERY OTHER DAY   SIMVASTATIN (ZOCOR) 40 MG TABLET    Take 1 tablet (40 mg total) by mouth every other day.   SODIUM PHOSPHATES (ENEMA RE)    Place rectally.   SPIRONOLACTONE-HYDROCHLOROTHIAZIDE (ALDACTAZIDE) 25-25 MG PER TABLET    TAKE 1/2 TABLET BY MOUTH ONCE DAILY  Modified Medications   No medications on file  Discontinued Medications   METRONIDAZOLE (FLAGYL) 500 MG TABLET    Take 1 tablet (500 mg total) by mouth 3 (three) times daily.    Physical Exam: Filed Vitals:   05/16/15 1324  BP: 122/64  Pulse: 76  Temp: 99.3 F (37.4 C)  TempSrc: Oral  Weight: 151 lb (68.493 kg)  SpO2: 95%   Physical Exam  Constitutional: She is oriented to person, place, and time. She appears well-developed and well-nourished. No distress.  Cardiovascular: Normal rate, regular rhythm, normal heart sounds and intact distal pulses.   Pulmonary/Chest: Effort normal and breath sounds normal. No respiratory distress.  Abdominal: Soft. Bowel  sounds are normal. She exhibits no distension and no mass. There is no tenderness. There is no rebound and no guarding.  Musculoskeletal: Normal range of motion.  Neurological: She is alert and oriented to person, place, and time.  Skin: Skin is warm and dry. There is pallor.  Psychiatric: She has a normal mood and affect.    Labs reviewed: Basic Metabolic Panel:  Recent Labs  09/20/14 0816 03/30/15 0813 05/03/15 1700 05/04/15 0429  NA 139 139 135 133*  K 4.2 3.9 3.6 3.6  CL 100 98 98* 100*  CO2 '23 22 26 23  '$ GLUCOSE 132* 119* 137*  150*  BUN '18 18 19 14  '$ CREATININE 1.23* 1.24* 1.17* 1.20*  CALCIUM 10.8* 9.8 10.0 9.4  TSH 1.100 1.640  --   --    Liver Function Tests:  Recent Labs  09/20/14 0816 05/03/15 1700 05/04/15 0429  AST 16 27 47*  ALT '13 19 25  '$ ALKPHOS 72 68 63  BILITOT 0.3 0.7 0.6  PROT 6.6 7.4 6.5  ALBUMIN  --  3.6 2.8*   No results for input(s): LIPASE, AMYLASE in the last 8760 hours. No results for input(s): AMMONIA in the last 8760 hours. CBC:  Recent Labs  09/20/14 0816 05/03/15 1700 05/04/15 0429 05/05/15 0651  WBC 8.5 15.0* 15.1* 10.9*  NEUTROABS 6.1  --  12.6*  --   HGB 13.6 14.0 12.6 11.6*  HCT 40.9 41.7 37.8 34.3*  MCV 87 85.3 84.2 85.8  PLT  --  282 247 197   Lipid Panel:  Recent Labs  09/20/14 0816 03/30/15 0813  CHOL 174 264*  HDL 73 76  LDLCALC 77 154*  TRIG 118 171*  CHOLHDL 2.4 3.5   Lab Results  Component Value Date   HGBA1C 6.6* 03/30/2015    Procedures since last visit: 05/03/15:  Abdominal xray:  Normal bowel gas pattern. 05/03/15:  CT abdomen/pelvis with contrast:    1. Complex hepatic segment 5 mass may represent developing abscess versus primary or secondary neoplasm. This should be approachable for percutaneous aspiration/ biopsy if needed. 2. Diverticulitis at the descending/sigmoid junction. 3. 3.8 cm fluid collection posteromedial to the colonic process, favor adnexal cyst or lymphocele over abscess. 4.  Hiatal hernia. 05/04/15:  US biopsy:  Technically successful ultrasound guided core needle biopsy of infiltrative mass involving the caudal aspect of the right lobe of the liver.  Assessment/Plan 1. Liver mass, right lobe -path shows primary hepatocellular ca vs. Metastatic breast cancer as differential -pt has been asymptomatic in terms of this aside from one distant episode of RUQ abdominal pain that was self-remitting (thought to be due to constipation at that time)--pt has had chole -has appt with Dr. Lindi Adie after PET scan tomorrow to determine course of treatment  -advised to please contact me for advise and keep me in the loop as she is making decisions about therapy  2. Diverticulitis of colon without hemorrhage -seems this is improving -discussed diverticular disease dietary changes today--pt says she received no information at the hospital about this -just completed cipro and flagyl which really caused a lot of nausea and were very difficult to swallow -had low grade temp on arrival when initially nervous and worried, but improved after she was seen to normal -if bowels do not return to normal in the next week or so, abdominal pain does not resolve, would refer her back to Dr. [name], but should not need to go if she gets better (seems she is)  3. Chronic constipation -has been chronic -discussed initial low fiber diet until her diverticulitis has cleared up then returning to higher fiber more regular diet minus major foods that cause flares like nuts, strawberries, corn/popcorn -had eaten a lot of pesto with pine nuts when she had her flare -maintain hydration  4. Anxiety state -seems stable at this time, has prn ativan, but does not take thus far  Labs/tests ordered:  No new Next appt: 1 month (flu shot then if she doesn't get it somewhere else by then--refused here today saying it's too early in the season)  [place]. Sherlonda Flater, D.O. [facility] [address], [place] [address] Cell Phone (Mon-Fri 8am-5pm):  [phone] On Call:  [phone] & follow prompts after 5pm & weekends Office Phone:  [phone] Office Fax:  [phone]

## 2015-05-16 NOTE — Telephone Encounter (Signed)
Please note that Marlowe Kays was the daughter I had spoken with on 9/26.

## 2015-05-17 ENCOUNTER — Telehealth: Payer: Self-pay | Admitting: Hematology and Oncology

## 2015-05-17 ENCOUNTER — Ambulatory Visit (HOSPITAL_BASED_OUTPATIENT_CLINIC_OR_DEPARTMENT_OTHER): Payer: Medicare Other | Admitting: Hematology and Oncology

## 2015-05-17 ENCOUNTER — Ambulatory Visit (HOSPITAL_COMMUNITY)
Admission: RE | Admit: 2015-05-17 | Discharge: 2015-05-17 | Disposition: A | Discharge status: Home / Self Care | Payer: Medicare Other | Source: Ambulatory Visit | Attending: Hematology and Oncology | Admitting: Hematology and Oncology

## 2015-05-17 ENCOUNTER — Encounter: Payer: Self-pay | Admitting: Hematology and Oncology

## 2015-05-17 VITALS — BP 132/75 | HR 71 | Temp 97.5°F | Resp 18 | Ht 63.0 in | Wt 151.6 lb

## 2015-05-17 DIAGNOSIS — Z853 Personal history of malignant neoplasm of breast: Secondary | ICD-10-CM | POA: Diagnosis not present

## 2015-05-17 DIAGNOSIS — I2510 Atherosclerotic heart disease of native coronary artery without angina pectoris: Secondary | ICD-10-CM | POA: Insufficient documentation

## 2015-05-17 DIAGNOSIS — C801 Malignant (primary) neoplasm, unspecified: Secondary | ICD-10-CM | POA: Diagnosis not present

## 2015-05-17 DIAGNOSIS — R948 Abnormal results of function studies of other organs and systems: Secondary | ICD-10-CM | POA: Diagnosis not present

## 2015-05-17 DIAGNOSIS — Z9011 Acquired absence of right breast and nipple: Secondary | ICD-10-CM | POA: Insufficient documentation

## 2015-05-17 DIAGNOSIS — C787 Secondary malignant neoplasm of liver and intrahepatic bile duct: Secondary | ICD-10-CM | POA: Diagnosis not present

## 2015-05-17 DIAGNOSIS — K5732 Diverticulitis of large intestine without perforation or abscess without bleeding: Secondary | ICD-10-CM

## 2015-05-17 DIAGNOSIS — R160 Hepatomegaly, not elsewhere classified: Secondary | ICD-10-CM

## 2015-05-17 DIAGNOSIS — Z9189 Other specified personal risk factors, not elsewhere classified: Secondary | ICD-10-CM | POA: Insufficient documentation

## 2015-05-17 DIAGNOSIS — C229 Malignant neoplasm of liver, not specified as primary or secondary: Secondary | ICD-10-CM | POA: Diagnosis not present

## 2015-05-17 LAB — GLUCOSE, CAPILLARY: Glucose-Capillary: 116 mg/dL — ABNORMAL HIGH (ref 65–99)

## 2015-05-17 MED ORDER — FLUDEOXYGLUCOSE F - 18 (FDG) INJECTION
8.9000 | Freq: Once | INTRAVENOUS | Status: DC | PRN
  Administered 2015-05-17: 8.9 via INTRAVENOUS
  Filled 2015-05-17: qty 8.9

## 2015-05-17 NOTE — Telephone Encounter (Signed)
per pof to sch pt appt-sch mamma-cld Dr Barry Dienes office to sch appt-left message with new pt coordinator to call me back to get pt sch appt

## 2015-05-17 NOTE — Assessment & Plan Note (Addendum)
Liver biopsy 05/04/2015: Poorly differentiated carcinoma strongly positive for CK 8/18, CK 7 and MOC-31; HPAR faint cytoplasmic staining; (negative for ER, PR, AFP, TTF-1, WT 1, p63, CD10, CDX 2, CK 20, CK 5/6, mucicarmine); CT abdomen 05/03/2015: 8.1 cm area of masslike enhancement in the liver, 3.8 cm fluid posterior to colon favoring adnexal cyst.  PET CT scan 05/17/2015: this has not been officially read but to my review there were no evidence of metastatic disease beyond the liver.  Pathology radiology review: I discussed with the patient the differential diagnosis is between metastatic breast cancer and primary hepatocellular carcinoma. To my physical examination I do not feel any abnormalities in the breast. We will order a mammogram to verify this. I would also like to obtain additional blood markers for cancers including CEA, CA 19-9, AFP.  Recommendation: 1.  Send pathology for cancer type ID 2.  Labwork tomorrow for tumor markers 3.  Referral to Dr. Barry Dienes regarding liver resection options ( Assuming that this is primary hepatocellular carcinoma) 4.  Mammogram left breast 5.  Return to clinic 05/27/2015  To discuss the results of these tests and follow-up plan.

## 2015-05-17 NOTE — Progress Notes (Signed)
[facility] CONSULT NOTE  Patient Care Team: Gayland Curry, DO as PCP - General (Geriatric Medicine)  CHIEF COMPLAINTS/PURPOSE OF CONSULTATION:  Newly diagnosed  Liver mass  HISTORY OF PRESENTING ILLNESS:  Destiny Velasquez 81 y.o. female is here because of recent diagnosis of  A very large mass in the right lobe of the liver measuring 8.1 cm. She presented to the hospital with weight bilateral lower abdominal pain and underwent a CT of the abdomen which revealed a very large mass in the right lobe of the liver. This was biopsied and pathology came back as poorly differentiated carcinoma that had signs suggestive of either breast primary or it could be a primary hepatocellular carcinoma. She underwent a PET CT scan earlier today and is here today accompanied by her daughter to discuss the results of the test. She reports that she does not have any further abdominal pain denies any bloody stools. She does have chronic constipation issues. Denies any pain or discomfort in the breast or anywhere else in her body. Denies any lumps or nodules in the breasts. Her last mammogram was about 4 years ago. At that time they identified some calcifications in the biopsy done which came back as atypical ductal hyperplasia.  Her daughter is a physician in [place] but she does not want [place] to discuss anything with her daughter. She is accompanied by another daughter who is here with her today.  I reviewed her records extensively and collaborated the history with the patient.  SUMMARY OF ONCOLOGIC HISTORY:   Metastatic cancer to liver ([place])   05/03/2015 Imaging CT abdomen: Bilobed 8.1 cm mass in the liver segment 5. 3.8 cm fluid collection posterior and medial: Favoring adnexal cyst   05/04/2015 Initial Diagnosis Poorly differentiated carcinoma strongly positive for CK 8/18, CK 7 and MOC-31; HPAR faint cytoplasmic staining; (negative for ER, PR, AFP, TTF-1, WT 1, p63, CD10, CDX 2, CK 20, CK 5/6,  mucicarmine)    MEDICAL HISTORY:  Past Medical History  Diagnosis Date  . Hypertension   . Hyperlipidemia   . Thyroid disease   . Subacute delirium   . Essential and other specified forms of tremor   . Unspecified hypothyroidism   . Diverticulosis of colon (without mention of hemorrhage)   . Unspecified constipation   . Anal and rectal polyp   . Disorder of bone and cartilage, unspecified   . Malignant neoplasm of breast (female), unspecified site   . Breast cancer, right breast ([place]) 1971    "never had chemo or radiation"  . Complication of anesthesia     "they have a hard time waking me up afterwards; it takes me a long time to recover"  . Pneumonia X 1  . Arthritis     "touch in my hands for a while; it went away" (05/03/2015)    SURGICAL HISTORY: Past Surgical History  Procedure Laterality Date  . Appendectomy    . Abdominal hysterectomy    . Tonsillectomy and adenoidectomy  1935  . Cholecystectomy    . Breast biopsy Right 1971  . Mastectomy Right 1971  . Tubal ligation    . Cataract extraction w/ intraocular lens  implant, bilateral Bilateral   . Rectal polypectomy  early 2000's    SOCIAL HISTORY: Social History   Social History  . Marital Status: Married    Spouse Name: N/A  . Number of Children: N/A  . Years of Education: N/A   Occupational History  . Not on file.  Social History Main Topics  . Smoking status: Former Smoker -- 0.50 packs/day for 40 years    Types: Cigarettes  . Smokeless tobacco: Never Used     Comment: "quit smoking cigarettes years ago; I was probably in my 24's"  . Alcohol Use: 3.0 oz/week    5 Glasses of wine per week     Comment: 05/03/2015 "drink of scotch q 3 weeks or so; 2-3 oz red wine ~ q afternoon"  . Drug Use: No  . Sexual Activity: No   Other Topics Concern  . Not on file   Social History Narrative    FAMILY HISTORY: Family History  Problem Relation Age of Onset  . Cancer Mother   . Hypertension Father      ALLERGIES:  has No Known Allergies.  MEDICATIONS:  Current Outpatient Prescriptions  Medication Sig Dispense Refill  . aspirin 81 MG tablet Take 81 mg by mouth daily.    . beta carotene w/minerals (OCUVITE) tablet Take 1 tablet by mouth daily.    . Cholecalciferol (VITAMIN D) 2000 UNITS tablet Take 2,000 Units by mouth daily.    Marland Kitchen estrogens, conjugated, (PREMARIN) 0.3 MG tablet Take one tablet every other day for one month (Patient taking differently: Take 0.15 mg by mouth every other day. ) 15 tablet 5  . feeding supplement (BOOST / RESOURCE BREEZE) LIQD Take 1 Container by mouth 3 (three) times daily between meals. 90 Container 0  . levothyroxine (SYNTHROID, LEVOTHROID) 100 MCG tablet TAKE ONE TABLET BY MOUTH 30 MINUTES BEFORE BREAKFAST EVERY MORNING. SEPARATE FROM OTHER MEDICATIONS FOR THYROID. 60 tablet 5  . LORazepam (ATIVAN) 0.5 MG tablet Take 1 tablet (0.5 mg total) by mouth every 6 (six) hours as needed for anxiety. 60 tablet 1  . losartan (COZAAR) 50 MG tablet Take one tablet by mouth once daily for blood pressure 90 tablet 1  . niacin 500 MG tablet Take 500 mg by mouth daily with breakfast.    . ondansetron (ZOFRAN) 4 MG tablet Take 1 tablet (4 mg total) by mouth every 8 (eight) hours as needed for nausea or vomiting. 30 tablet 0  . PREMARIN 0.3 MG tablet TAKE 1 TABLET(S) BY MOUTH EVERY OTHER DAY 15 tablet 4  . simvastatin (ZOCOR) 40 MG tablet Take 1 tablet (40 mg total) by mouth every other day. 15 tablet 3  . Sodium Phosphates (ENEMA RE) Place rectally.    Marland Kitchen spironolactone-hydrochlorothiazide (ALDACTAZIDE) 25-25 MG per tablet TAKE 1/2 TABLET BY MOUTH ONCE DAILY 45 tablet 5   No current facility-administered medications for this visit.   Facility-Administered Medications Ordered in Other Visits  Medication Dose Route Frequency Provider Last Rate Last Dose  . fludeoxyglucose F - 18 (FDG) injection 8.9 milli Curie  8.9 milli Curie Intravenous Once PRN Medication Radiologist, MD    8.9 milli Curie at 05/17/15 1405    REVIEW OF SYSTEMS:   Constitutional: Denies fevers, chills or abnormal night sweats Eyes: Denies blurriness of vision, double vision or watery eyes Ears, nose, mouth, throat, and face: Denies mucositis or sore throat Respiratory: Denies cough, dyspnea or wheezes Cardiovascular: Denies palpitation, chest discomfort or lower extremity swelling Gastrointestinal:  Denies nausea, heartburn or change in bowel habits Skin: Denies abnormal skin rashes Lymphatics: Denies new lymphadenopathy or easy bruising Neurological:Denies numbness, tingling or new weaknesses Behavioral/Psych: Mood is stable, no new changes  Breast:  Denies any palpable lumps or discharge All other systems were reviewed with the patient and are negative.  PHYSICAL EXAMINATION: ECOG  PERFORMANCE STATUS: 0 - Asymptomatic  Filed Vitals:   05/17/15 1547  BP: 132/75  Pulse: 71  Temp: 97.5 F (36.4 C)  Resp: 18   Filed Weights   05/17/15 1547  Weight: 151 lb 9.6 oz (68.765 kg)    GENERAL:alert, no distress and comfortable SKIN: skin color, texture, turgor are normal, no rashes or significant lesions EYES: normal, conjunctiva are pink and non-injected, sclera clear OROPHARYNX:no exudate, no erythema and lips, buccal mucosa, and tongue normal  NECK: supple, thyroid normal size, non-tender, without nodularity LYMPH:  no palpable lymphadenopathy in the cervical, axillary or inguinal LUNGS: clear to auscultation and percussion with normal breathing effort HEART: regular rate & rhythm and no murmurs and no lower extremity edema ABDOMEN:abdomen soft, non-tender and normal bowel sounds Musculoskeletal:no cyanosis of digits and no clubbing  PSYCH: alert & oriented x 3 with fluent speech NEURO: no focal motor/sensory deficits BREAST: No palpable nodules in  left breast. No palpable axillary or supraclavicular lymphadenopathy (exam performed in the presence of a chaperone)   LABORATORY  DATA:  I have reviewed the data as listed Lab Results  Component Value Date   WBC 10.9* 05/05/2015   HGB 11.6* 05/05/2015   HCT 34.3* 05/05/2015   MCV 85.8 05/05/2015   PLT 197 05/05/2015   Lab Results  Component Value Date   NA 133* 05/04/2015   K 3.6 05/04/2015   CL 100* 05/04/2015   CO2 23 05/04/2015    RADIOGRAPHIC STUDIES:  CT of abdomen and pelvis and PET/CT were reviewed  ASSESSMENT AND PLAN:  Metastatic cancer to liver [facility]) Liver biopsy 05/04/2015: Poorly differentiated carcinoma strongly positive for CK 8/18, CK 7 and MOC-31; HPAR faint cytoplasmic staining; (negative for ER, PR, AFP, TTF-1, WT 1, p63, CD10, CDX 2, CK 20, CK 5/6, mucicarmine); CT abdomen 05/03/2015: 8.1 cm area of masslike enhancement in the liver, 3.8 cm fluid posterior to colon favoring adnexal cyst.  PET CT scan 05/17/2015: this has not been officially read but to my review there were no evidence of metastatic disease beyond the liver.  Pathology radiology review: I discussed with the patient the differential diagnosis is between metastatic breast cancer and primary hepatocellular carcinoma. To my physical examination I do not feel any abnormalities in the breast. We will order a mammogram to verify this. I would also like to obtain additional blood markers for cancers including CEA, CA 19-9, AFP.  Recommendation: 1.  Send pathology for cancer type ID 2.  Labwork tomorrow for tumor markers 3.  Referral to Dr. Barry Dienes regarding liver resection options ( Assuming that this is primary hepatocellular carcinoma or carcinoma of unknown primary) 4.  Mammogram left breast 5.  Return to clinic 05/27/2015  To discuss the results of these tests and follow-up plan.  All questions were answered. The patient knows to call the clinic with any problems, questions or concerns.    Rulon Eisenmenger, MD 4:55 PM

## 2015-05-18 ENCOUNTER — Encounter: Payer: Self-pay | Admitting: Hematology and Oncology

## 2015-05-18 ENCOUNTER — Other Ambulatory Visit (HOSPITAL_BASED_OUTPATIENT_CLINIC_OR_DEPARTMENT_OTHER): Payer: Medicare Other

## 2015-05-18 ENCOUNTER — Telehealth: Payer: Self-pay | Admitting: Hematology and Oncology

## 2015-05-18 DIAGNOSIS — C787 Secondary malignant neoplasm of liver and intrahepatic bile duct: Secondary | ICD-10-CM

## 2015-05-18 DIAGNOSIS — C801 Malignant (primary) neoplasm, unspecified: Secondary | ICD-10-CM | POA: Diagnosis present

## 2015-05-18 DIAGNOSIS — Z9189 Other specified personal risk factors, not elsewhere classified: Secondary | ICD-10-CM

## 2015-05-18 DIAGNOSIS — K5732 Diverticulitis of large intestine without perforation or abscess without bleeding: Secondary | ICD-10-CM

## 2015-05-18 DIAGNOSIS — C50019 Malignant neoplasm of nipple and areola, unspecified female breast: Secondary | ICD-10-CM | POA: Diagnosis not present

## 2015-05-18 NOTE — Addendum Note (Signed)
Addended by: Prentiss Bells on: 05/18/2015 02:21 PM   Modules accepted: Orders, Medications

## 2015-05-18 NOTE — Progress Notes (Signed)
New pt intake form rcvd - reviewed by Dr. Lindi Adie.  Chart updated.  Sent to scan.

## 2015-05-18 NOTE — Telephone Encounter (Signed)
Per patients daughter we need to move her visit to after dr Barry Dienes on 10/14.done and avs printed

## 2015-05-19 LAB — CEA: CEA: <0.5 ng/mL (ref 0.0–5.0)

## 2015-05-19 LAB — AFP TUMOR MARKER: AFP TUMOR MARKER: 6.5 ng/mL — AB (ref <6.1)

## 2015-05-19 LAB — CANCER ANTIGEN 19-9: CA 19 9: 175.4 U/mL — AB (ref <35.0)

## 2015-05-20 ENCOUNTER — Other Ambulatory Visit: Payer: Self-pay | Admitting: Hematology and Oncology

## 2015-05-20 ENCOUNTER — Ambulatory Visit
Admission: RE | Admit: 2015-05-20 | Discharge: 2015-05-20 | Disposition: A | Discharge status: Home / Self Care | Payer: Medicare Other | Source: Ambulatory Visit | Attending: Hematology and Oncology | Admitting: Hematology and Oncology

## 2015-05-20 DIAGNOSIS — C787 Secondary malignant neoplasm of liver and intrahepatic bile duct: Secondary | ICD-10-CM

## 2015-05-20 DIAGNOSIS — N6489 Other specified disorders of breast: Secondary | ICD-10-CM | POA: Diagnosis not present

## 2015-05-20 DIAGNOSIS — R928 Other abnormal and inconclusive findings on diagnostic imaging of breast: Secondary | ICD-10-CM | POA: Diagnosis not present

## 2015-05-27 ENCOUNTER — Ambulatory Visit: Payer: Medicare Other | Admitting: Hematology and Oncology

## 2015-05-27 ENCOUNTER — Encounter: Payer: Self-pay | Admitting: Hematology and Oncology

## 2015-05-27 ENCOUNTER — Telehealth: Payer: Self-pay | Admitting: *Deleted

## 2015-05-27 ENCOUNTER — Ambulatory Visit (HOSPITAL_BASED_OUTPATIENT_CLINIC_OR_DEPARTMENT_OTHER): Payer: Medicare Other | Admitting: Hematology and Oncology

## 2015-05-27 VITALS — BP 148/62 | HR 74 | Temp 97.5°F | Resp 18 | Ht 63.0 in | Wt 148.4 lb

## 2015-05-27 DIAGNOSIS — C801 Malignant (primary) neoplasm, unspecified: Secondary | ICD-10-CM

## 2015-05-27 DIAGNOSIS — C787 Secondary malignant neoplasm of liver and intrahepatic bile duct: Secondary | ICD-10-CM

## 2015-05-27 DIAGNOSIS — C229 Malignant neoplasm of liver, not specified as primary or secondary: Secondary | ICD-10-CM | POA: Diagnosis not present

## 2015-05-27 NOTE — Assessment & Plan Note (Signed)
Liver biopsy 05/04/2015: Poorly differentiated carcinoma strongly positive for CK 8/18, CK 7 and MOC-31; HPAR faint cytoplasmic staining; (negative for ER, PR, AFP, TTF-1, WT 1, p63, CD10, CDX 2, CK 20, CK 5/6, mucicarmine); CT abdomen 05/03/2015: 8.1 cm area of masslike enhancement in the liver, 3.8 cm fluid posterior to colon favoring adnexal cyst. PET CT scan 99/87/2158 : Hypermetabolic necrotic liver lesion , slight uptake in the sigmoid colon probably related to diverticulitis versus primary colon cancer Mammograms : Normal  Tumor markers: AFP 6.5, CA 19-9 175.4 , CEA less than 0.5   Recommendation: 1.  Awaiting results of cancer type ID 2.  The only lab abnormality was elevated seen 19-9  Which could be elevated primarily because of the tumor in the liver. 3.  Awaiting Dr. Marlowe Aschoff recommendation regarding liver resection.   2.  Referral to Dr. Barry Dienes

## 2015-05-27 NOTE — Progress Notes (Signed)
Patient Care Team: Gayland Curry, DO as PCP - General (Geriatric Medicine)  DIAGNOSIS: No matching staging information was found for the patient.  SUMMARY OF ONCOLOGIC HISTORY:   Metastatic cancer to liver ([place])   05/03/2015 Imaging CT abdomen: Bilobed 8.1 cm mass in the liver segment 5. 3.8 cm fluid collection posterior and medial: Favoring adnexal cyst   05/04/2015 Initial Diagnosis Poorly differentiated carcinoma strongly positive for CK 8/18, CK 7 and MOC-31; HPAR faint cytoplasmic staining; (negative for ER, PR, AFP, TTF-1, WT 1, p63, CD10, CDX 2, CK 20, CK 5/6, mucicarmine)   05/17/2015 PET scan Hypermetabolic partially necrotic right liver lobe mass , hypermetabolic some within the descending/sigmoid colon junction in the region of inflammation/  diverticulitis versus cancer    CHIEF COMPLIANT:  Follow-up PET/CT scan  And blood tests  INTERVAL HISTORY: Destiny Velasquez is a  81 year old with above-mentioned history of very large liver mass who underwent a biopsy that showed a poorly differentiated carcinoma. Primary could not be identified based on PET CT scan. She saw Dr. Barry Dienes today and is here today to discuss with me about different options for treatment. In spite of her advanced age, she is an excellent performance status. She had a mammogram which was normal. Blood work did not show any marked increase in alpha-fetoprotein. See in 19-9 was increased.  REVIEW OF SYSTEMS:   Constitutional: Denies fevers, chills or abnormal weight loss Eyes: Denies blurriness of vision Ears, nose, mouth, throat, and face: Denies mucositis or sore throat Respiratory: Denies cough, dyspnea or wheezes Cardiovascular: Denies palpitation, chest discomfort or lower extremity swelling Gastrointestinal:  Denies nausea, heartburn or change in bowel habits Skin: Denies abnormal skin rashes Lymphatics: Denies new lymphadenopathy or easy bruising Neurological:Denies numbness, tingling or new  weaknesses Behavioral/Psych: Mood is stable, no new changes  Breast:  denies any pain or lumps or nodules in either breasts All other systems were reviewed with the patient and are negative.  I have reviewed the past medical history, past surgical history, social history and family history with the patient and they are unchanged from previous note.  ALLERGIES:  has No Known Allergies.  MEDICATIONS:  Current Outpatient Prescriptions  Medication Sig Dispense Refill  . aspirin 81 MG tablet Take 81 mg by mouth daily.    . beta carotene w/minerals (OCUVITE) tablet Take 1 tablet by mouth daily.    . Cholecalciferol (VITAMIN D) 2000 UNITS tablet Take 2,000 Units by mouth daily.    Marland Kitchen estrogens, conjugated, (PREMARIN) 0.3 MG tablet Take one tablet every other day for one month (Patient taking differently: Take 0.15 mg by mouth every other day. ) 15 tablet 5  . feeding supplement (BOOST / RESOURCE BREEZE) LIQD Take 1 Container by mouth 3 (three) times daily between meals. 90 Container 0  . levothyroxine (SYNTHROID, LEVOTHROID) 100 MCG tablet TAKE ONE TABLET BY MOUTH 30 MINUTES BEFORE BREAKFAST EVERY MORNING. SEPARATE FROM OTHER MEDICATIONS FOR THYROID. 60 tablet 5  . LORazepam (ATIVAN) 0.5 MG tablet Take 1 tablet (0.5 mg total) by mouth every 6 (six) hours as needed for anxiety. 60 tablet 1  . losartan (COZAAR) 50 MG tablet Take one tablet by mouth once daily for blood pressure 90 tablet 1  . niacin 500 MG tablet Take 500 mg by mouth daily with breakfast.    . ondansetron (ZOFRAN) 4 MG tablet Take 1 tablet (4 mg total) by mouth every 8 (eight) hours as needed for nausea or vomiting. 30 tablet 0  . Probiotic  Product (PROBIOTIC DAILY PO) Take by mouth.    . simvastatin (ZOCOR) 40 MG tablet Take 1 tablet (40 mg total) by mouth every other day. 15 tablet 3  . Sodium Phosphates (ENEMA RE) Place rectally.    Marland Kitchen spironolactone-hydrochlorothiazide (ALDACTAZIDE) 25-25 MG per tablet TAKE 1/2 TABLET BY MOUTH ONCE  DAILY 45 tablet 5   No current facility-administered medications for this visit.    PHYSICAL EXAMINATION: ECOG PERFORMANCE STATUS: 1 - Symptomatic but completely ambulatory  Filed Vitals:   05/27/15 1318  BP: 148/62  Pulse: 74  Temp: 97.5 F (36.4 C)  Resp: 18   Filed Weights   05/27/15 1318  Weight: 148 lb 6.4 oz (67.314 kg)    GENERAL:alert, no distress and comfortable SKIN: skin color, texture, turgor are normal, no rashes or significant lesions EYES: normal, Conjunctiva are pink and non-injected, sclera clear OROPHARYNX:no exudate, no erythema and lips, buccal mucosa, and tongue normal  NECK: supple, thyroid normal size, non-tender, without nodularity LYMPH:  no palpable lymphadenopathy in the cervical, axillary or inguinal LUNGS: clear to auscultation and percussion with normal breathing effort HEART: regular rate & rhythm and no murmurs and no lower extremity edema ABDOMEN:abdomen soft, non-tender and normal bowel sounds Musculoskeletal:no cyanosis of digits and no clubbing  NEURO: alert & oriented x 3 with fluent speech, no focal motor/sensory deficits  LABORATORY DATA:  I have reviewed the data as listed   Chemistry      Component Value Date/Time   NA 133* 05/04/2015 0429   NA 139 03/30/2015 0813   K 3.6 05/04/2015 0429   CL 100* 05/04/2015 0429   CO2 23 05/04/2015 0429   BUN 14 05/04/2015 0429   BUN 18 03/30/2015 0813   CREATININE 1.20* 05/04/2015 0429      Component Value Date/Time   CALCIUM 9.4 05/04/2015 0429   ALKPHOS 63 05/04/2015 0429   AST 47* 05/04/2015 0429   ALT 25 05/04/2015 0429   BILITOT 0.6 05/04/2015 0429   BILITOT 0.3 09/20/2014 0816       Lab Results  Component Value Date   WBC 10.9* 05/05/2015   HGB 11.6* 05/05/2015   HCT 34.3* 05/05/2015   MCV 85.8 05/05/2015   PLT 197 05/05/2015   NEUTROABS 12.6* 05/04/2015   ASSESSMENT & PLAN:  Metastatic cancer to liver [facility]) Liver biopsy 05/04/2015: Poorly differentiated carcinoma  strongly positive for CK 8/18, CK 7 and MOC-31; HPAR faint cytoplasmic staining; (negative for ER, PR, AFP, TTF-1, WT 1, p63, CD10, CDX 2, CK 20, CK 5/6, mucicarmine); CT abdomen 05/03/2015: 8.1 cm area of masslike enhancement in the liver, 3.8 cm fluid posterior to colon favoring adnexal cyst. PET CT scan 16/05/9603 : Hypermetabolic necrotic liver lesion , slight uptake in the sigmoid colon probably related to diverticulitis versus primary colon cancer Mammograms : Normal  Tumor markers: AFP 6.5, CA 19-9 175.4 , CEA less than 0.5   Recommendation: 1.  Awaiting results of cancer type ID 2.  The only lab abnormality was elevated seen 19-9  Which could be elevated primarily because of the tumor in the liver. 3.  Awaiting Dr. Marlowe Aschoff recommendation regarding liver resection.   Patient is leaning towards undergoing liver resection. She will call Dr. Barry Dienes and let her know of her decision.  I discussed with her couple of chemotherapy options if she decided not to do surgery 1.  Xeloda with oxaliplatin 2.  Taxol plus carboplatin  I discussed the goals of chemotherapy would be measuring the cancers and prolong  her life. They would not be able to cure this cancer. The cancer will eventually progress and take her life.  I may be able to better determine chemotherapy based on cancer type ID testing.  No orders of the defined types were placed in this encounter.   The patient has a good understanding of the overall plan. she agrees with it. she will call with any problems that may develop before the next visit here.   Rulon Eisenmenger, MD 05/27/2015

## 2015-05-27 NOTE — Telephone Encounter (Signed)
Called pathology to check when cancer type ID would be back. Test was not ordered but [facility] Pontes will be contacted and request sent.

## 2015-05-30 ENCOUNTER — Encounter: Payer: Self-pay | Admitting: Hematology and Oncology

## 2015-05-30 NOTE — Telephone Encounter (Signed)
05/30/15 1530 - Spoke with daughter [name].  Let her know we cannot email records due to [place].  She will provide fax no to send records to Dr. Alejandro Mulling.  Requested records be sent as they come in.

## 2015-06-02 ENCOUNTER — Telehealth: Payer: Self-pay

## 2015-06-02 DIAGNOSIS — R933 Abnormal findings on diagnostic imaging of other parts of digestive tract: Secondary | ICD-10-CM | POA: Diagnosis not present

## 2015-06-02 NOTE — Telephone Encounter (Signed)
Call rcvd from [facility] at [facility] Pathology.  Dr Lindi Adie requested Cancer Type ID.  Per Pathology MD, there is insufficient tissue on the block to do the study.  MD notified.

## 2015-06-06 DIAGNOSIS — Z538 Procedure and treatment not carried out for other reasons: Secondary | ICD-10-CM | POA: Diagnosis not present

## 2015-06-06 DIAGNOSIS — K5669 Other intestinal obstruction: Secondary | ICD-10-CM | POA: Diagnosis not present

## 2015-06-06 DIAGNOSIS — K5730 Diverticulosis of large intestine without perforation or abscess without bleeding: Secondary | ICD-10-CM | POA: Diagnosis not present

## 2015-06-06 DIAGNOSIS — R933 Abnormal findings on diagnostic imaging of other parts of digestive tract: Secondary | ICD-10-CM | POA: Diagnosis not present

## 2015-06-07 ENCOUNTER — Telehealth: Payer: Self-pay | Admitting: *Deleted

## 2015-06-07 ENCOUNTER — Other Ambulatory Visit: Payer: Self-pay | Admitting: General Surgery

## 2015-06-07 NOTE — Telephone Encounter (Signed)
TC from pt's daughter stating that pt had her colonoscopy on 06/06/15 (no sign of cancer per daughter), but now they are encountering serious delays in getting surgical consultation with Dr. Barry Dienes.  Per daughter, they cannot get an appointment for consultation until next Tuesday, 06/14/15. Pt is becoming quite anxious about this, was told she needed surgery within 2 weeks (daughter states she was told that on 05/27/15) and 2 weeks have passed at this point.  Daughter asking for referral to another surgeon.

## 2015-06-07 NOTE — Telephone Encounter (Signed)
Call from [place] asking if she had missed a call from [place]. S/w Dr Geralyn Flash nurse and no call was made. Assured daughter that Dr Lindi Adie looks at his messages later in day after seeing pt's.

## 2015-06-07 NOTE — Telephone Encounter (Signed)
Called and spoke with [name] and told her that Dr. Lindi Adie wanted Dr. Barry Dienes to do the surgery as she was the best. [name] stated that Dr. Marlowe Aschoff office was trying to get the patient in sooner and they were calling as I spoke with [facility].

## 2015-06-10 NOTE — Patient Instructions (Signed)
Destiny Velasquez  06/10/2015   Your procedure is scheduled on: 06/14/2015   Report to [facility] Main  Entrance take [facility]  elevators to 3rd floor to  [place] at    1045 AM.  Call this number if you have problems the morning of surgery [phone]   Remember: ONLY 1 PERSON MAY GO WITH YOU TO SHORT STAY TO GET  READY MORNING OF [place].  Do not eat food or drink liquids :After Midnight.     Take these medicines the morning of surgery with A SIP OF WATER:  Synthroid, Lorazepam ( Ativan )                                 You may not have any metal on your body including hair pins and              piercings  Do not wear jewelry, make-up, lotions, powders or perfumes, deodorant             Do not wear nail polish.  Do not shave  48 hours prior to surgery.               Do not bring valuables to the hospital. [place].  Contacts, dentures or bridgework may not be worn into surgery.  Leave suitcase in the car. After surgery it may be brought to your room.   Special Instructions: coughing and deep breathing exercises, leg exercises               Please read over the following fact sheets you were given: _____________________________________________________________________             [facility] - Preparing for Surgery Before surgery, you can play an important role.  Because skin is not sterile, your skin needs to be as free of germs as possible.  You can reduce the number of germs on your skin by washing with CHG (chlorahexidine gluconate) soap before surgery.  CHG is an antiseptic cleaner which kills germs and bonds with the skin to continue killing germs even after washing. Please DO NOT use if you have an allergy to CHG or antibacterial soaps.  If your skin becomes reddened/irritated stop using the CHG and inform your nurse when you arrive at Short Stay. Do not shave (including legs and underarms) for  at least 48 hours prior to the first CHG shower.  You may shave your face/neck. Please follow these instructions carefully:  1.  Shower with CHG Soap the night before surgery and the  morning of Surgery.  2.  If you choose to wash your hair, wash your hair first as usual with your  normal  shampoo.  3.  After you shampoo, rinse your hair and body thoroughly to remove the  shampoo.                           4.  Use CHG as you would any other liquid soap.  You can apply chg directly  to the skin and wash                       Gently with a scrungie  or clean washcloth.  5.  Apply the CHG Soap to your body ONLY FROM THE NECK DOWN.   Do not use on face/ open                           Wound or open sores. Avoid contact with eyes, ears mouth and genitals (private parts).                       Wash face,  Genitals (private parts) with your normal soap.             6.  Wash thoroughly, paying special attention to the area where your surgery  will be performed.  7.  Thoroughly rinse your body with warm water from the neck down.  8.  DO NOT shower/wash with your normal soap after using and rinsing off  the CHG Soap.                9.  Pat yourself dry with a clean towel.            10.  Wear clean pajamas.            11.  Place clean sheets on your bed the night of your first shower and do not  sleep with pets. Day of Surgery : Do not apply any lotions/deodorants the morning of surgery.  Please wear clean clothes to the hospital/surgery center.  FAILURE TO FOLLOW THESE INSTRUCTIONS MAY RESULT IN THE CANCELLATION OF YOUR SURGERY PATIENT SIGNATURE_________________________________  NURSE SIGNATURE__________________________________  ________________________________________________________________________  WHAT IS A BLOOD TRANSFUSION? Blood Transfusion Information  A transfusion is the replacement of blood or some of its parts. Blood is made up of multiple cells which provide different functions.  Red  blood cells carry oxygen and are used for blood loss replacement.  White blood cells fight against infection.  Platelets control bleeding.  Plasma helps clot blood.  Other blood products are available for specialized needs, such as hemophilia or other clotting disorders. BEFORE THE TRANSFUSION  Who gives blood for transfusions?   Healthy volunteers who are fully evaluated to make sure their blood is safe. This is blood bank blood. Transfusion therapy is the safest it has ever been in the practice of medicine. Before blood is taken from a donor, a complete history is taken to make sure that person has no history of diseases nor engages in risky social behavior (examples are intravenous drug use or sexual activity with multiple partners). The donor's travel history is screened to minimize risk of transmitting infections, such as malaria. The donated blood is tested for signs of infectious diseases, such as HIV and hepatitis. The blood is then tested to be sure it is compatible with you in order to minimize the chance of a transfusion reaction. If you or a relative donates blood, this is often done in anticipation of surgery and is not appropriate for emergency situations. It takes many days to process the donated blood. RISKS AND COMPLICATIONS Although transfusion therapy is very safe and saves many lives, the main dangers of transfusion include:   Getting an infectious disease.  Developing a transfusion reaction. This is an allergic reaction to something in the blood you were given. Every precaution is taken to prevent this. The decision to have a blood transfusion has been considered carefully by your caregiver before blood is given. Blood is not given unless the benefits outweigh the risks. AFTER THE  TRANSFUSION  Right after receiving a blood transfusion, you will usually feel much better and more energetic. This is especially true if your red blood cells have gotten low (anemic). The  transfusion raises the level of the red blood cells which carry oxygen, and this usually causes an energy increase.  The nurse administering the transfusion will monitor you carefully for complications. HOME CARE INSTRUCTIONS  No special instructions are needed after a transfusion. You may find your energy is better. Speak with your caregiver about any limitations on activity for underlying diseases you may have. SEEK MEDICAL CARE IF:   Your condition is not improving after your transfusion.  You develop redness or irritation at the intravenous (IV) site. SEEK IMMEDIATE MEDICAL CARE IF:  Any of the following symptoms occur over the next 12 hours:  Shaking chills.  You have a temperature by mouth above 102 F (38.9 C), not controlled by medicine.  Chest, back, or muscle pain.  People around you feel you are not acting correctly or are confused.  Shortness of breath or difficulty breathing.  Dizziness and fainting.  You get a rash or develop hives.  You have a decrease in urine output.  Your urine turns a dark color or changes to pink, red, or brown. Any of the following symptoms occur over the next 10 days:  You have a temperature by mouth above 102 F (38.9 C), not controlled by medicine.  Shortness of breath.  Weakness after normal activity.  The white part of the eye turns yellow (jaundice).  You have a decrease in the amount of urine or are urinating less often.  Your urine turns a dark color or changes to pink, red, or brown. Document Released: 07/27/2000 Document Revised: 10/22/2011 Document Reviewed: 03/15/2008 [facility] Patient Information 2014 [place], [place].  _______________________________________________________________________

## 2015-06-13 ENCOUNTER — Encounter (HOSPITAL_COMMUNITY)
Admission: RE | Admit: 2015-06-13 | Discharge: 2015-06-13 | Disposition: A | Discharge status: Home / Self Care | Payer: Medicare Other | Source: Ambulatory Visit | Attending: General Surgery | Admitting: General Surgery

## 2015-06-13 ENCOUNTER — Encounter (HOSPITAL_COMMUNITY): Payer: Self-pay

## 2015-06-13 HISTORY — DX: Gastro-esophageal reflux disease without esophagitis: K21.9

## 2015-06-13 LAB — CBC WITH DIFFERENTIAL/PLATELET
Basophils Absolute: 0.1 10*3/uL (ref 0.0–0.1)
Basophils Relative: 1 %
EOS PCT: 1 %
Eosinophils Absolute: 0.1 10*3/uL (ref 0.0–0.7)
HCT: 41.9 % (ref 36.0–46.0)
Hemoglobin: 14.1 g/dL (ref 12.0–15.0)
LYMPHS ABS: 1.3 10*3/uL (ref 0.7–4.0)
LYMPHS PCT: 12 %
MCH: 29.2 pg (ref 26.0–34.0)
MCHC: 33.7 g/dL (ref 30.0–36.0)
MCV: 86.7 fL (ref 78.0–100.0)
MONO ABS: 1 10*3/uL (ref 0.1–1.0)
Monocytes Relative: 10 %
Neutro Abs: 8.4 10*3/uL — ABNORMAL HIGH (ref 1.7–7.7)
Neutrophils Relative %: 76 %
PLATELETS: 216 10*3/uL (ref 150–400)
RBC: 4.83 MIL/uL (ref 3.87–5.11)
RDW: 12.8 % (ref 11.5–15.5)
WBC: 10.9 10*3/uL — AB (ref 4.0–10.5)

## 2015-06-13 LAB — COMPREHENSIVE METABOLIC PANEL
ALT: 21 U/L (ref 14–54)
AST: 31 U/L (ref 15–41)
Albumin: 3.5 g/dL (ref 3.5–5.0)
Alkaline Phosphatase: 79 U/L (ref 38–126)
Anion gap: 7 (ref 5–15)
BUN: 15 mg/dL (ref 6–20)
CALCIUM: 10.3 mg/dL (ref 8.9–10.3)
CHLORIDE: 99 mmol/L — AB (ref 101–111)
CO2: 29 mmol/L (ref 22–32)
CREATININE: 1.06 mg/dL — AB (ref 0.44–1.00)
GFR, EST AFRICAN AMERICAN: 53 mL/min — AB (ref >60)
GFR, EST NON AFRICAN AMERICAN: 46 mL/min — AB (ref >60)
Glucose, Bld: 140 mg/dL — ABNORMAL HIGH (ref 65–99)
Potassium: 4.4 mmol/L (ref 3.5–5.1)
Sodium: 135 mmol/L (ref 135–145)
TOTAL PROTEIN: 7.7 g/dL (ref 6.5–8.1)
Total Bilirubin: 1 mg/dL (ref 0.3–1.2)

## 2015-06-13 LAB — PROTIME-INR
INR: 1.08 (ref 0.00–1.49)
PROTHROMBIN TIME: 14.2 s (ref 11.6–15.2)

## 2015-06-13 LAB — ABO/RH: ABO/RH(D): B POS

## 2015-06-13 LAB — PREPARE RBC (CROSSMATCH)

## 2015-06-13 NOTE — Progress Notes (Signed)
EKG- 09/21/14 - EPIC

## 2015-06-13 NOTE — Progress Notes (Signed)
CMP result done 06/13/15 faxed via EPIC to Dr Barry Dienes

## 2015-06-14 ENCOUNTER — Encounter (HOSPITAL_COMMUNITY)
Admission: RE | Disposition: A | Discharge status: Skilled Nursing Facility | Payer: Self-pay | Source: Ambulatory Visit | Attending: General Surgery

## 2015-06-14 ENCOUNTER — Encounter (HOSPITAL_COMMUNITY): Payer: Self-pay | Admitting: *Deleted

## 2015-06-14 ENCOUNTER — Inpatient Hospital Stay (HOSPITAL_COMMUNITY): Payer: Medicare Other | Admitting: Anesthesiology

## 2015-06-14 ENCOUNTER — Inpatient Hospital Stay (HOSPITAL_COMMUNITY)
Admission: RE | Admit: 2015-06-14 | Discharge: 2015-06-23 | DRG: 406 | Disposition: A | Discharge status: Skilled Nursing Facility | Payer: Medicare Other | Source: Ambulatory Visit | Attending: General Surgery | Admitting: General Surgery

## 2015-06-14 DIAGNOSIS — R410 Disorientation, unspecified: Secondary | ICD-10-CM | POA: Diagnosis present

## 2015-06-14 DIAGNOSIS — Z87891 Personal history of nicotine dependence: Secondary | ICD-10-CM

## 2015-06-14 DIAGNOSIS — I10 Essential (primary) hypertension: Secondary | ICD-10-CM | POA: Diagnosis present

## 2015-06-14 DIAGNOSIS — C229 Malignant neoplasm of liver, not specified as primary or secondary: Secondary | ICD-10-CM | POA: Diagnosis not present

## 2015-06-14 DIAGNOSIS — K649 Unspecified hemorrhoids: Secondary | ICD-10-CM | POA: Diagnosis not present

## 2015-06-14 DIAGNOSIS — K567 Ileus, unspecified: Secondary | ICD-10-CM | POA: Diagnosis not present

## 2015-06-14 DIAGNOSIS — K219 Gastro-esophageal reflux disease without esophagitis: Secondary | ICD-10-CM | POA: Diagnosis present

## 2015-06-14 DIAGNOSIS — E039 Hypothyroidism, unspecified: Secondary | ICD-10-CM | POA: Diagnosis present

## 2015-06-14 DIAGNOSIS — Z853 Personal history of malignant neoplasm of breast: Secondary | ICD-10-CM | POA: Diagnosis not present

## 2015-06-14 DIAGNOSIS — R160 Hepatomegaly, not elsewhere classified: Secondary | ICD-10-CM | POA: Diagnosis not present

## 2015-06-14 DIAGNOSIS — D1809 Hemangioma of other sites: Secondary | ICD-10-CM | POA: Diagnosis not present

## 2015-06-14 DIAGNOSIS — Z8249 Family history of ischemic heart disease and other diseases of the circulatory system: Secondary | ICD-10-CM | POA: Diagnosis not present

## 2015-06-14 DIAGNOSIS — K660 Peritoneal adhesions (postprocedural) (postinfection): Secondary | ICD-10-CM | POA: Diagnosis present

## 2015-06-14 DIAGNOSIS — K5900 Constipation, unspecified: Secondary | ICD-10-CM | POA: Diagnosis not present

## 2015-06-14 DIAGNOSIS — Z7982 Long term (current) use of aspirin: Secondary | ICD-10-CM | POA: Diagnosis not present

## 2015-06-14 DIAGNOSIS — I959 Hypotension, unspecified: Secondary | ICD-10-CM | POA: Diagnosis not present

## 2015-06-14 DIAGNOSIS — G8929 Other chronic pain: Secondary | ICD-10-CM | POA: Diagnosis not present

## 2015-06-14 DIAGNOSIS — R930 Abnormal findings on diagnostic imaging of skull and head, not elsewhere classified: Secondary | ICD-10-CM

## 2015-06-14 DIAGNOSIS — K5909 Other constipation: Secondary | ICD-10-CM | POA: Diagnosis present

## 2015-06-14 DIAGNOSIS — E7800 Pure hypercholesterolemia, unspecified: Secondary | ICD-10-CM | POA: Diagnosis present

## 2015-06-14 DIAGNOSIS — C228 Malignant neoplasm of liver, primary, unspecified as to type: Secondary | ICD-10-CM | POA: Diagnosis not present

## 2015-06-14 DIAGNOSIS — R29810 Facial weakness: Secondary | ICD-10-CM | POA: Diagnosis not present

## 2015-06-14 DIAGNOSIS — Z79899 Other long term (current) drug therapy: Secondary | ICD-10-CM | POA: Diagnosis not present

## 2015-06-14 DIAGNOSIS — R41841 Cognitive communication deficit: Secondary | ICD-10-CM | POA: Diagnosis not present

## 2015-06-14 DIAGNOSIS — F05 Delirium due to known physiological condition: Secondary | ICD-10-CM

## 2015-06-14 DIAGNOSIS — C220 Liver cell carcinoma: Principal | ICD-10-CM | POA: Diagnosis present

## 2015-06-14 DIAGNOSIS — R634 Abnormal weight loss: Secondary | ICD-10-CM | POA: Diagnosis not present

## 2015-06-14 DIAGNOSIS — Z8049 Family history of malignant neoplasm of other genital organs: Secondary | ICD-10-CM | POA: Diagnosis not present

## 2015-06-14 DIAGNOSIS — Z823 Family history of stroke: Secondary | ICD-10-CM

## 2015-06-14 DIAGNOSIS — R110 Nausea: Secondary | ICD-10-CM | POA: Diagnosis not present

## 2015-06-14 DIAGNOSIS — M6281 Muscle weakness (generalized): Secondary | ICD-10-CM | POA: Diagnosis not present

## 2015-06-14 DIAGNOSIS — N390 Urinary tract infection, site not specified: Secondary | ICD-10-CM | POA: Diagnosis not present

## 2015-06-14 DIAGNOSIS — G9389 Other specified disorders of brain: Secondary | ICD-10-CM | POA: Diagnosis not present

## 2015-06-14 DIAGNOSIS — C787 Secondary malignant neoplasm of liver and intrahepatic bile duct: Secondary | ICD-10-CM | POA: Diagnosis present

## 2015-06-14 DIAGNOSIS — Z9223 Personal history of estrogen therapy: Secondary | ICD-10-CM | POA: Diagnosis not present

## 2015-06-14 DIAGNOSIS — Z01812 Encounter for preprocedural laboratory examination: Secondary | ICD-10-CM

## 2015-06-14 DIAGNOSIS — Z9049 Acquired absence of other specified parts of digestive tract: Secondary | ICD-10-CM | POA: Diagnosis not present

## 2015-06-14 DIAGNOSIS — F419 Anxiety disorder, unspecified: Secondary | ICD-10-CM | POA: Diagnosis not present

## 2015-06-14 DIAGNOSIS — E559 Vitamin D deficiency, unspecified: Secondary | ICD-10-CM | POA: Diagnosis not present

## 2015-06-14 DIAGNOSIS — E785 Hyperlipidemia, unspecified: Secondary | ICD-10-CM | POA: Diagnosis not present

## 2015-06-14 DIAGNOSIS — Z483 Aftercare following surgery for neoplasm: Secondary | ICD-10-CM | POA: Diagnosis not present

## 2015-06-14 HISTORY — DX: Diverticulitis of large intestine without perforation or abscess without bleeding: K57.32

## 2015-06-14 HISTORY — DX: Malignant neoplasm of liver, not specified as primary or secondary: C22.9

## 2015-06-14 HISTORY — PX: LIVER BIOPSY: SHX301

## 2015-06-14 HISTORY — PX: LAPAROSCOPY: SHX197

## 2015-06-14 HISTORY — PX: OPEN PARTIAL HEPATECTOMY [83]: SHX5987

## 2015-06-14 LAB — MRSA PCR SCREENING: MRSA BY PCR: NEGATIVE

## 2015-06-14 SURGERY — LAPAROSCOPY, DIAGNOSTIC
Anesthesia: General | Site: Abdomen

## 2015-06-14 MED ORDER — LORAZEPAM 0.5 MG PO TABS
0.5000 mg | ORAL_TABLET | Freq: Four times a day (QID) | ORAL | Status: DC | PRN
  Administered 2015-06-16 – 2015-06-18 (×4): 0.5 mg via ORAL
  Filled 2015-06-14 (×4): qty 1

## 2015-06-14 MED ORDER — NON FORMULARY: 0.1500 mg | Status: DC

## 2015-06-14 MED ORDER — HYDROMORPHONE HCL 1 MG/ML IJ SOLN
0.5000 mg | INTRAMUSCULAR | Status: DC | PRN
Start: 2015-06-14 — End: 2015-06-15

## 2015-06-14 MED ORDER — LABETALOL HCL 5 MG/ML IV SOLN
INTRAVENOUS | Status: DC | PRN
  Administered 2015-06-14 (×3): 5 mg via INTRAVENOUS

## 2015-06-14 MED ORDER — ONDANSETRON HCL 4 MG/2ML IJ SOLN
INTRAMUSCULAR | Status: DC | PRN
  Administered 2015-06-14: 4 mg via INTRAVENOUS

## 2015-06-14 MED ORDER — FENTANYL CITRATE (PF) 250 MCG/5ML IJ SOLN
INTRAMUSCULAR | Status: AC
  Filled 2015-06-14: qty 25

## 2015-06-14 MED ORDER — POLYETHYLENE GLYCOL 3350 17 G PO PACK
17.0000 g | PACK | Freq: Every day | ORAL | Status: DC | PRN
  Administered 2015-06-17: 17 g via ORAL
  Filled 2015-06-14: qty 1

## 2015-06-14 MED ORDER — HYDROMORPHONE HCL 2 MG/ML IJ SOLN
INTRAMUSCULAR | Status: AC
  Filled 2015-06-14: qty 1

## 2015-06-14 MED ORDER — BUPIVACAINE-EPINEPHRINE 0.25% -1:200000 IJ SOLN
INTRAMUSCULAR | Status: DC | PRN
  Administered 2015-06-14: 10 mL

## 2015-06-14 MED ORDER — LOSARTAN POTASSIUM 50 MG PO TABS
50.0000 mg | ORAL_TABLET | Freq: Every day | ORAL | Status: DC
  Filled 2015-06-14 (×2): qty 1

## 2015-06-14 MED ORDER — HYDROMORPHONE 1 MG/ML IV SOLN
INTRAVENOUS | Status: AC
  Filled 2015-06-14: qty 25

## 2015-06-14 MED ORDER — KCL IN DEXTROSE-NACL 20-5-0.45 MEQ/L-%-% IV SOLN
INTRAVENOUS | Status: DC
  Administered 2015-06-14 – 2015-06-15 (×2): via INTRAVENOUS
  Administered 2015-06-15 – 2015-06-16 (×2): 1000 mL via INTRAVENOUS
  Administered 2015-06-17 (×2): via INTRAVENOUS
  Filled 2015-06-14 (×6): qty 1000

## 2015-06-14 MED ORDER — ACETAMINOPHEN 325 MG PO TABS
650.0000 mg | ORAL_TABLET | Freq: Four times a day (QID) | ORAL | Status: AC
  Administered 2015-06-15 – 2015-06-17 (×9): 650 mg via ORAL
  Filled 2015-06-14 (×9): qty 2

## 2015-06-14 MED ORDER — SUGAMMADEX SODIUM 200 MG/2ML IV SOLN
INTRAVENOUS | Status: AC
  Filled 2015-06-14: qty 2

## 2015-06-14 MED ORDER — NALOXONE HCL 0.4 MG/ML IJ SOLN: 0.4000 mg | INTRAMUSCULAR | Status: DC | PRN

## 2015-06-14 MED ORDER — LACTATED RINGERS IV BOLUS (SEPSIS)
250.0000 mL | Freq: Once | INTRAVENOUS | Status: AC
  Administered 2015-06-14: 250 mL via INTRAVENOUS

## 2015-06-14 MED ORDER — LIDOCAINE HCL 1 % IJ SOLN
INTRAMUSCULAR | Status: AC
  Filled 2015-06-14: qty 60

## 2015-06-14 MED ORDER — FENTANYL CITRATE (PF) 100 MCG/2ML IJ SOLN
INTRAMUSCULAR | Status: AC
  Administered 2015-06-14: 12.5 ug via INTRAVENOUS
  Filled 2015-06-14: qty 2

## 2015-06-14 MED ORDER — SENNOSIDES-DOCUSATE SODIUM 8.6-50 MG PO TABS: 1.0000 | ORAL_TABLET | Freq: Every evening | ORAL | Status: DC | PRN

## 2015-06-14 MED ORDER — HYDROMORPHONE HCL 1 MG/ML IJ SOLN
0.2500 mg | INTRAMUSCULAR | Status: DC | PRN
  Administered 2015-06-14 (×3): 0.25 mg via INTRAVENOUS

## 2015-06-14 MED ORDER — EPHEDRINE SULFATE 50 MG/ML IJ SOLN
INTRAMUSCULAR | Status: DC | PRN
  Administered 2015-06-14: 10 mg via INTRAVENOUS

## 2015-06-14 MED ORDER — BUPIVACAINE 0.25 % ON-Q PUMP DUAL CATH 300 ML
300.0000 mL | INJECTION | Status: DC
  Administered 2015-06-14: 300 mL
  Filled 2015-06-14: qty 300

## 2015-06-14 MED ORDER — CEFAZOLIN SODIUM-DEXTROSE 2-3 GM-% IV SOLR
2.0000 g | Freq: Three times a day (TID) | INTRAVENOUS | Status: AC
  Administered 2015-06-15: 2 g via INTRAVENOUS
  Filled 2015-06-14: qty 50

## 2015-06-14 MED ORDER — BISACODYL 5 MG PO TBEC
5.0000 mg | DELAYED_RELEASE_TABLET | Freq: Every day | ORAL | Status: DC | PRN
  Administered 2015-06-18: 5 mg via ORAL
  Filled 2015-06-14: qty 1

## 2015-06-14 MED ORDER — DIPHENHYDRAMINE HCL 50 MG/ML IJ SOLN: 12.5000 mg | Freq: Four times a day (QID) | INTRAMUSCULAR | Status: DC | PRN

## 2015-06-14 MED ORDER — LEVOTHYROXINE SODIUM 100 MCG PO TABS
100.0000 ug | ORAL_TABLET | Freq: Every day | ORAL | Status: DC
  Administered 2015-06-17 – 2015-06-23 (×6): 100 ug via ORAL
  Filled 2015-06-14 (×7): qty 1

## 2015-06-14 MED ORDER — ONDANSETRON HCL 4 MG/2ML IJ SOLN
4.0000 mg | Freq: Four times a day (QID) | INTRAMUSCULAR | Status: DC | PRN
  Administered 2015-06-15 – 2015-06-20 (×2): 4 mg via INTRAVENOUS
  Filled 2015-06-14 (×2): qty 2

## 2015-06-14 MED ORDER — TISSEEL VH 10 ML EX KIT
PACK | CUTANEOUS | Status: AC
  Filled 2015-06-14: qty 1

## 2015-06-14 MED ORDER — ONDANSETRON HCL 4 MG/2ML IJ SOLN
4.0000 mg | Freq: Four times a day (QID) | INTRAMUSCULAR | Status: DC | PRN
  Administered 2015-06-15: 4 mg via INTRAVENOUS
  Filled 2015-06-14: qty 2

## 2015-06-14 MED ORDER — HYDROCHLOROTHIAZIDE 12.5 MG PO CAPS
12.5000 mg | ORAL_CAPSULE | Freq: Every day | ORAL | Status: DC
  Administered 2015-06-15 – 2015-06-22 (×8): 12.5 mg via ORAL
  Filled 2015-06-14 (×9): qty 1

## 2015-06-14 MED ORDER — LACTATED RINGERS IV SOLN
INTRAVENOUS | Status: DC | PRN
  Administered 2015-06-14 (×2): via INTRAVENOUS

## 2015-06-14 MED ORDER — CEFAZOLIN SODIUM-DEXTROSE 2-3 GM-% IV SOLR
2.0000 g | INTRAVENOUS | Status: AC
  Administered 2015-06-14: 2 g via INTRAVENOUS

## 2015-06-14 MED ORDER — SUGAMMADEX SODIUM 200 MG/2ML IV SOLN
INTRAVENOUS | Status: DC | PRN
  Administered 2015-06-14: 200 mg via INTRAVENOUS

## 2015-06-14 MED ORDER — SPIRONOLACTONE 25 MG PO TABS
12.5000 mg | ORAL_TABLET | Freq: Every day | ORAL | Status: DC
  Administered 2015-06-15 – 2015-06-23 (×9): 12.5 mg via ORAL
  Filled 2015-06-14 (×9): qty 1

## 2015-06-14 MED ORDER — FENTANYL CITRATE (PF) 100 MCG/2ML IJ SOLN
25.0000 ug | INTRAMUSCULAR | Status: DC | PRN
  Administered 2015-06-14: 12.5 ug via INTRAVENOUS
  Administered 2015-06-14: 25 ug via INTRAVENOUS
  Administered 2015-06-14: 12.5 ug via INTRAVENOUS

## 2015-06-14 MED ORDER — SIMETHICONE 80 MG PO CHEW
40.0000 mg | CHEWABLE_TABLET | Freq: Four times a day (QID) | ORAL | Status: DC | PRN
  Filled 2015-06-14: qty 1

## 2015-06-14 MED ORDER — ESTROGENS CONJUGATED 0.3 MG PO TABS: 0.1500 mg | ORAL_TABLET | ORAL | Status: DC

## 2015-06-14 MED ORDER — HYDRALAZINE HCL 20 MG/ML IJ SOLN
10.0000 mg | INTRAMUSCULAR | Status: DC | PRN
  Administered 2015-06-16 – 2015-06-17 (×2): 10 mg via INTRAVENOUS
  Filled 2015-06-14 (×2): qty 1

## 2015-06-14 MED ORDER — LIDOCAINE HCL (PF) 1 % IJ SOLN
INTRAMUSCULAR | Status: DC | PRN
  Administered 2015-06-14: 10 mL

## 2015-06-14 MED ORDER — BUPIVACAINE-EPINEPHRINE (PF) 0.25% -1:200000 IJ SOLN
INTRAMUSCULAR | Status: AC
  Filled 2015-06-14: qty 60

## 2015-06-14 MED ORDER — TISSEEL VH 10 ML EX KIT
PACK | CUTANEOUS | Status: DC | PRN
  Administered 2015-06-14: 10 mL

## 2015-06-14 MED ORDER — FENTANYL CITRATE (PF) 100 MCG/2ML IJ SOLN
INTRAMUSCULAR | Status: AC
  Administered 2015-06-14: 25 ug via INTRAVENOUS
  Filled 2015-06-14: qty 2

## 2015-06-14 MED ORDER — SIMVASTATIN 40 MG PO TABS
40.0000 mg | ORAL_TABLET | ORAL | Status: DC
  Administered 2015-06-16 – 2015-06-22 (×4): 40 mg via ORAL
  Filled 2015-06-14 (×5): qty 1

## 2015-06-14 MED ORDER — ROCURONIUM BROMIDE 100 MG/10ML IV SOLN
INTRAVENOUS | Status: DC | PRN
  Administered 2015-06-14 (×4): 10 mg via INTRAVENOUS
  Administered 2015-06-14: 30 mg via INTRAVENOUS
  Administered 2015-06-14: 10 mg via INTRAVENOUS

## 2015-06-14 MED ORDER — FENTANYL CITRATE (PF) 100 MCG/2ML IJ SOLN
INTRAMUSCULAR | Status: DC | PRN
  Administered 2015-06-14: 25 ug via INTRAVENOUS
  Administered 2015-06-14 (×2): 50 ug via INTRAVENOUS
  Administered 2015-06-14: 100 ug via INTRAVENOUS
  Administered 2015-06-14 (×2): 25 ug via INTRAVENOUS
  Administered 2015-06-14: 50 ug via INTRAVENOUS
  Administered 2015-06-14: 25 ug via INTRAVENOUS

## 2015-06-14 MED ORDER — LIDOCAINE HCL (CARDIAC) 20 MG/ML IV SOLN
INTRAVENOUS | Status: DC | PRN
  Administered 2015-06-14: 50 mg via INTRAVENOUS

## 2015-06-14 MED ORDER — LACTATED RINGERS IR SOLN
Status: DC | PRN
  Administered 2015-06-14: 1000 mL

## 2015-06-14 MED ORDER — PHENYLEPHRINE 40 MCG/ML (10ML) SYRINGE FOR IV PUSH (FOR BLOOD PRESSURE SUPPORT)
PREFILLED_SYRINGE | INTRAVENOUS | Status: AC
  Filled 2015-06-14: qty 10

## 2015-06-14 MED ORDER — HYDROMORPHONE 1 MG/ML IV SOLN
INTRAVENOUS | Status: DC
  Administered 2015-06-14: 17:00:00 via INTRAVENOUS
  Administered 2015-06-15 (×2): 0.8 mg via INTRAVENOUS

## 2015-06-14 MED ORDER — PHENYLEPHRINE 40 MCG/ML (10ML) SYRINGE FOR IV PUSH (FOR BLOOD PRESSURE SUPPORT)
PREFILLED_SYRINGE | INTRAVENOUS | Status: AC
  Filled 2015-06-14: qty 20

## 2015-06-14 MED ORDER — DIPHENHYDRAMINE HCL 50 MG/ML IJ SOLN
12.5000 mg | Freq: Four times a day (QID) | INTRAMUSCULAR | Status: DC | PRN
Start: 2015-06-14 — End: 2015-06-23

## 2015-06-14 MED ORDER — PHENYLEPHRINE HCL 10 MG/ML IJ SOLN
INTRAMUSCULAR | Status: DC | PRN
  Administered 2015-06-14 (×5): 80 ug via INTRAVENOUS
  Administered 2015-06-14 (×3): 40 ug via INTRAVENOUS

## 2015-06-14 MED ORDER — NAPROXEN 500 MG PO TABS
500.0000 mg | ORAL_TABLET | Freq: Two times a day (BID) | ORAL | Status: DC | PRN
  Filled 2015-06-14: qty 1

## 2015-06-14 MED ORDER — METHOCARBAMOL 500 MG PO TABS
500.0000 mg | ORAL_TABLET | Freq: Four times a day (QID) | ORAL | Status: DC | PRN
  Administered 2015-06-17: 500 mg via ORAL
  Filled 2015-06-14: qty 1

## 2015-06-14 MED ORDER — HYDROMORPHONE HCL 1 MG/ML IJ SOLN
INTRAMUSCULAR | Status: AC
  Filled 2015-06-14: qty 1

## 2015-06-14 MED ORDER — PROPOFOL 10 MG/ML IV BOLUS
INTRAVENOUS | Status: DC | PRN
  Administered 2015-06-14: 80 mg via INTRAVENOUS

## 2015-06-14 MED ORDER — HYDROMORPHONE HCL 1 MG/ML IJ SOLN
INTRAMUSCULAR | Status: DC | PRN
  Administered 2015-06-14 (×4): 0.5 mg via INTRAVENOUS

## 2015-06-14 MED ORDER — DIPHENHYDRAMINE HCL 12.5 MG/5ML PO ELIX: 12.5000 mg | ORAL_SOLUTION | Freq: Four times a day (QID) | ORAL | Status: DC | PRN

## 2015-06-14 MED ORDER — PROMETHAZINE HCL 25 MG/ML IJ SOLN: 6.2500 mg | INTRAMUSCULAR | Status: DC | PRN

## 2015-06-14 MED ORDER — CEFAZOLIN SODIUM-DEXTROSE 2-3 GM-% IV SOLR
INTRAVENOUS | Status: AC
  Filled 2015-06-14: qty 50

## 2015-06-14 MED ORDER — SODIUM CHLORIDE 0.9 % IJ SOLN: 9.0000 mL | INTRAMUSCULAR | Status: DC | PRN

## 2015-06-14 MED ORDER — DIPHENHYDRAMINE HCL 12.5 MG/5ML PO ELIX
12.5000 mg | ORAL_SOLUTION | Freq: Four times a day (QID) | ORAL | Status: DC | PRN
  Filled 2015-06-14: qty 5

## 2015-06-14 MED ORDER — 0.9 % SODIUM CHLORIDE (POUR BTL) OPTIME
TOPICAL | Status: DC | PRN
  Administered 2015-06-14: 2000 mL

## 2015-06-14 MED ORDER — SPIRONOLACTONE-HCTZ 25-25 MG PO TABS
0.5000 | ORAL_TABLET | Freq: Every day | ORAL | Status: DC
  Filled 2015-06-14 (×2): qty 1

## 2015-06-14 MED ORDER — PANTOPRAZOLE SODIUM 40 MG IV SOLR
40.0000 mg | Freq: Every day | INTRAVENOUS | Status: DC
Start: 2015-06-14 — End: 2015-06-18
  Administered 2015-06-15 – 2015-06-17 (×4): 40 mg via INTRAVENOUS
  Filled 2015-06-14 (×4): qty 40

## 2015-06-14 MED ORDER — ESTROGENS CONJUGATED 0.3 MG PO TABS
0.3000 mg | ORAL_TABLET | ORAL | Status: DC
  Administered 2015-06-21: 0.3 mg via ORAL
  Filled 2015-06-14 (×6): qty 1

## 2015-06-14 MED ORDER — PROPOFOL 10 MG/ML IV BOLUS
INTRAVENOUS | Status: AC
  Filled 2015-06-14: qty 20

## 2015-06-14 MED ORDER — LACTATED RINGERS IV SOLN
INTRAVENOUS | Status: DC | PRN
  Administered 2015-06-14 (×2): via INTRAVENOUS

## 2015-06-14 MED ORDER — ONDANSETRON 4 MG PO TBDP
4.0000 mg | ORAL_TABLET | Freq: Four times a day (QID) | ORAL | Status: DC | PRN
  Filled 2015-06-14: qty 1

## 2015-06-14 MED ORDER — BUPIVACAINE ON-Q PAIN PUMP (FOR ORDER SET NO CHG): INJECTION | Status: AC

## 2015-06-14 SURGICAL SUPPLY — 136 items
APL SKNCLS STERI-STRIP NONHPOA (GAUZE/BANDAGES/DRESSINGS)
APPLIER CLIP 5 13 M/L LIGAMAX5 (MISCELLANEOUS)
APPLIER CLIP ROT 10 11.4 M/L (STAPLE)
APR CLP MED LRG 11.4X10 (STAPLE)
APR CLP MED LRG 5 ANG JAW (MISCELLANEOUS)
BENZOIN TINCTURE PRP APPL 2/3 (GAUZE/BANDAGES/DRESSINGS) IMPLANT
BLADE EXTENDED COATED 6.5IN (ELECTRODE) ×4 IMPLANT
BLADE HEX COATED 2.75 (ELECTRODE) ×4 IMPLANT
BRR ADH 5X3 SEPRAFILM 6 SHT (MISCELLANEOUS) ×1
CATH FOLEY 2WAY SLVR  5CC 16FR (CATHETERS)
CATH FOLEY 2WAY SLVR 5CC 16FR (CATHETERS) IMPLANT
CATH KIT ON-Q SILVERSOAK 7.5IN (CATHETERS) ×8 IMPLANT
CATH ROBINSON RED A/P 12FR (CATHETERS) IMPLANT
CATH ROBINSON RED A/P 14FR (CATHETERS) ×4 IMPLANT
CATH ROBINSON RED A/P 16FR (CATHETERS) IMPLANT
CATH ROBINSON RED A/P 18FR (CATHETERS) IMPLANT
CATH ROBINSON RED A/P 20FR (CATHETERS) IMPLANT
CATH ROBINSON RED A/P 22FR (CATHETERS) ×4 IMPLANT
CHLORAPREP W/TINT 26ML (MISCELLANEOUS) ×4 IMPLANT
CLIP APPLIE 5 13 M/L LIGAMAX5 (MISCELLANEOUS) IMPLANT
CLIP APPLIE ROT 10 11.4 M/L (STAPLE) IMPLANT
CLIP LIGATING HEM O LOK PURPLE (MISCELLANEOUS) ×4 IMPLANT
CLIP LIGATING HEMO O LOK GREEN (MISCELLANEOUS) ×4 IMPLANT
CLIP LIGATING HEMOLOK MED (MISCELLANEOUS) ×4 IMPLANT
CLIP TI LARGE 6 (CLIP) ×8 IMPLANT
CLIP TI MEDIUM 6 (CLIP) ×4 IMPLANT
CLOSURE WOUND 1/2 X4 (GAUZE/BANDAGES/DRESSINGS)
COVER SURGICAL LIGHT HANDLE (MISCELLANEOUS) ×4 IMPLANT
DECANTER SPIKE VIAL GLASS SM (MISCELLANEOUS) ×4 IMPLANT
DISSECTOR ROUND CHERRY 3/8 STR (MISCELLANEOUS) IMPLANT
DRAIN CHANNEL 19F RND (DRAIN) ×4 IMPLANT
DRAIN CHANNEL RND F F (WOUND CARE) ×4 IMPLANT
DRAPE C-ARM 42X120 X-RAY (DRAPES) IMPLANT
DRAPE LAPAROSCOPIC ABDOMINAL (DRAPES) ×4 IMPLANT
DRAPE SHEET LG 3/4 BI-LAMINATE (DRAPES) IMPLANT
DRAPE TOWEL STR TPT 18X26 WHT (DRAPES) ×8 IMPLANT
DRAPE WARM FLUID 44X44 (DRAPE) ×8 IMPLANT
DRESSING TELFA ISLAND 4X8 (GAUZE/BANDAGES/DRESSINGS) ×4 IMPLANT
DRSG PAD ABDOMINAL 8X10 ST (GAUZE/BANDAGES/DRESSINGS) IMPLANT
DRSG TELFA 3X8 NADH (GAUZE/BANDAGES/DRESSINGS) ×4 IMPLANT
DRSG TELFA 4X10 ISLAND STR (GAUZE/BANDAGES/DRESSINGS) ×4 IMPLANT
DRSG TELFA PLUS 4X6 ADH ISLAND (GAUZE/BANDAGES/DRESSINGS) IMPLANT
ELECT REM PT RETURN 9FT ADLT (ELECTROSURGICAL) ×4
ELECTRODE REM PT RTRN 9FT ADLT (ELECTROSURGICAL) ×2 IMPLANT
EVACUATOR SILICONE 100CC (DRAIN) ×4 IMPLANT
GAUZE SPONGE 4X4 12PLY STRL (GAUZE/BANDAGES/DRESSINGS) IMPLANT
GAUZE SPONGE 4X4 16PLY XRAY LF (GAUZE/BANDAGES/DRESSINGS) ×4 IMPLANT
GLOVE BIO SURGEON STRL SZ 6 (GLOVE) ×8 IMPLANT
GLOVE BIOGEL PI IND STRL 6.5 (GLOVE) ×2 IMPLANT
GLOVE BIOGEL PI IND STRL 7.0 (GLOVE) ×2 IMPLANT
GLOVE BIOGEL PI INDICATOR 6.5 (GLOVE) ×2
GLOVE BIOGEL PI INDICATOR 7.0 (GLOVE) ×2
GLOVE INDICATOR 6.5 STRL GRN (GLOVE) ×8 IMPLANT
GOWN STRL REIN 2XL LVL4 (GOWN DISPOSABLE) ×8 IMPLANT
GOWN STRL REUS W/ TWL XL LVL3 (GOWN DISPOSABLE) ×6 IMPLANT
GOWN STRL REUS W/TWL 2XL LVL3 (GOWN DISPOSABLE) ×4 IMPLANT
GOWN STRL REUS W/TWL LRG LVL3 (GOWN DISPOSABLE) IMPLANT
GOWN STRL REUS W/TWL XL LVL3 (GOWN DISPOSABLE) ×12
HEMOSTAT SNOW SURGICEL 2X4 (HEMOSTASIS) ×16 IMPLANT
HEMOSTAT SURGICEL 4X8 (HEMOSTASIS) ×4 IMPLANT
KIT BASIN OR (CUSTOM PROCEDURE TRAY) ×4 IMPLANT
L-HOOK LAP DISP 36CM (ELECTROSURGICAL) ×4
LHOOK LAP DISP 36CM (ELECTROSURGICAL) ×2 IMPLANT
LIQUID BAND (GAUZE/BANDAGES/DRESSINGS) IMPLANT
LOOP MINI RED (MISCELLANEOUS) IMPLANT
LOOP VESSEL MAXI BLUE (MISCELLANEOUS) ×4 IMPLANT
LUBRICANT JELLY K Y 4OZ (MISCELLANEOUS) IMPLANT
NEEDLE BIOPSY 14GX4.5 SOFT TIS (NEEDLE) IMPLANT
NEEDLE HYPO 22GX1.5 SAFETY (NEEDLE) ×4 IMPLANT
NS IRRIG 1000ML POUR BTL (IV SOLUTION) ×8 IMPLANT
PACK GENERAL/GYN (CUSTOM PROCEDURE TRAY) ×4 IMPLANT
PACK UNIVERSAL I (CUSTOM PROCEDURE TRAY) ×4 IMPLANT
PLUG CATH AND CAP STER (CATHETERS) IMPLANT
RELOAD STAPLER WHITE 60MM (STAPLE) ×6 IMPLANT
SCISSORS HARMONIC WAVE 18CM (INSTRUMENTS) ×4 IMPLANT
SEPRAFILM PROCEDURAL PACK 3X5 (MISCELLANEOUS) ×4 IMPLANT
SET IRRIG TUBING LAPAROSCOPIC (IRRIGATION / IRRIGATOR) IMPLANT
SET TUBE IRRIG SUCTION NO TIP (IRRIGATION / IRRIGATOR) IMPLANT
SHEARS FOC LG CVD HARMONIC 17C (MISCELLANEOUS) IMPLANT
SHEARS HARMONIC 9CM CVD (BLADE) IMPLANT
SHEARS HARMONIC ACE PLUS 36CM (ENDOMECHANICALS) IMPLANT
SLEEVE SURGEON STRL (DRAPES) IMPLANT
SOLUTION ANTI FOG 6CC (MISCELLANEOUS) ×4 IMPLANT
SPONGE DRAIN TRACH 4X4 STRL 2S (GAUZE/BANDAGES/DRESSINGS) ×4 IMPLANT
SPONGE LAP 18X18 X RAY DECT (DISPOSABLE) ×28 IMPLANT
SPONGE SURGIFOAM ABS GEL 100 (HEMOSTASIS) IMPLANT
STAPLE ECHEON FLEX 60 POW ENDO (STAPLE) ×4 IMPLANT
STAPLER RELOAD WHITE 60MM (STAPLE) ×12
STAPLER VISISTAT 35W (STAPLE) ×4 IMPLANT
STRIP CLOSURE SKIN 1/2X4 (GAUZE/BANDAGES/DRESSINGS) IMPLANT
SUCTION POOLE TIP (SUCTIONS) ×4 IMPLANT
SUT ETHILON 2 0 PS N (SUTURE) IMPLANT
SUT MNCRL AB 4-0 PS2 18 (SUTURE) ×4 IMPLANT
SUT PDS AB 0 CTX 60 (SUTURE) IMPLANT
SUT PDS AB 1 TP1 54 (SUTURE) IMPLANT
SUT PDS AB 1 TP1 96 (SUTURE) ×12 IMPLANT
SUT PROLENE 3 0 SH 48 (SUTURE) ×8 IMPLANT
SUT PROLENE 3 0 SH1 36 (SUTURE) IMPLANT
SUT PROLENE 4 0 RB 1 (SUTURE) ×6
SUT PROLENE 4-0 RB1 .5 CRCL 36 (SUTURE) ×4 IMPLANT
SUT PROLENE 5 0 CC 1 (SUTURE) IMPLANT
SUT SILK 0 FSL (SUTURE) IMPLANT
SUT SILK 2 0 (SUTURE) ×4
SUT SILK 2 0 SH CR/8 (SUTURE) ×4 IMPLANT
SUT SILK 2-0 18XBRD TIE 12 (SUTURE) ×2 IMPLANT
SUT SILK 3 0 (SUTURE)
SUT SILK 3 0 SH CR/8 (SUTURE) ×4 IMPLANT
SUT SILK 3-0 18XBRD TIE 12 (SUTURE) IMPLANT
SUT VIC AB 1 CTX 18 (SUTURE) IMPLANT
SUT VIC AB 2-0 CTX 36 (SUTURE) ×16 IMPLANT
SUT VIC AB 3-0 SH 18 (SUTURE) IMPLANT
SUT VIC AB 4-0 PS2 27 (SUTURE) IMPLANT
SUT VIC AB 4-0 SH 18 (SUTURE) IMPLANT
SUT VICRYL 2 0 18  UND BR (SUTURE) ×2
SUT VICRYL 2 0 18 UND BR (SUTURE) ×2 IMPLANT
SUT VICRYL 3 0 BR 18  UND (SUTURE)
SUT VICRYL 3 0 BR 18 UND (SUTURE) IMPLANT
SYR CONTROL 10ML LL (SYRINGE) ×4 IMPLANT
SYRINGE 10CC LL (SYRINGE) IMPLANT
SYS LAPSCP GELPORT 120MM (MISCELLANEOUS)
SYSTEM LAPSCP GELPORT 120MM (MISCELLANEOUS) IMPLANT
TAPE UMBILICAL COTTON 1/8X30 (MISCELLANEOUS) ×4 IMPLANT
TOWEL BLUE STERILE X RAY DET (MISCELLANEOUS) IMPLANT
TOWEL OR 17X26 10 PK STRL BLUE (TOWEL DISPOSABLE) ×12 IMPLANT
TRAY FOLEY W/METER SILVER 14FR (SET/KITS/TRAYS/PACK) ×4 IMPLANT
TRAY LAPAROSCOPIC (CUSTOM PROCEDURE TRAY) ×4 IMPLANT
TROCAR BLADELESS OPT 5 75 (ENDOMECHANICALS) IMPLANT
TROCAR XCEL 12X100 BLDLESS (ENDOMECHANICALS) IMPLANT
TROCAR XCEL BLUNT TIP 100MML (ENDOMECHANICALS) ×4 IMPLANT
TROCAR XCEL NON-BLD 11X100MML (ENDOMECHANICALS) IMPLANT
TROCAR XCEL UNIV SLVE 11M 100M (ENDOMECHANICALS) IMPLANT
TUBE FEEDING 5FR 36IN KANGAROO (TUBING) IMPLANT
TUBING INSUFFLATION 10FT LAP (TUBING) ×4 IMPLANT
TUNNELER SHEATH ON-Q 16GX12 DP (PAIN MANAGEMENT) ×4 IMPLANT
WATER STERILE IRR 1500ML POUR (IV SOLUTION) ×4 IMPLANT
YANKAUER SUCT BULB TIP NO VENT (SUCTIONS) ×4 IMPLANT

## 2015-06-14 NOTE — Op Note (Signed)
PREOPERATIVE DIAGNOSIS:  Adenocarcinoma of the liver, presumed primary.  POSTOPERATIVE DIAGNOSIS:  Same  PROCEDURE PERFORMED:  Diagnostic laparoscopy, partial right hepatic lobectomy  SURGEON:  [name], MD  ASSISTANT:  Jackolyn Confer, MD  ANESTHESIA:  General and local.  FINDINGS: Three small firm nodules on left liver, frozen section negative for malignancy.     Large necrotic tumor in right inferior liver.  No enlarged LN in porta.  No evidence of peritoneal disease  SPECIMEN:  Partial right liver to Pathology. (segments 7 and 8)  ESTIMATED BLOOD LOSS:  500 mL.  COMPLICATIONS:  Unknown.  PROCEDURE:  Patient was identified in the holding area and taken to the operating room where she was placed supine on the operating room table.  General endotracheal anesthesia was induced.  Her abdomen was prepped and draped in a sterile fashion.  A time-out was performed according to the surgical safety check list.  When all was correct, we continued.  The left subcostal region was anesthetized with local anesthetic and a small 6 mm incision was made with a #11 blade. Optiview port was placed without difficulty.  Pneumoperitoneum was achieved.  The camera was placed in the abdomen and no overt signs of carcinomatosis were seen.    There were several small left sided nodules.  An additional 5 mm port was placed in the upper midline.  The laparoscopic biopsy forceps were used to biopsy these lesions and send them for frozen section.  Hemostasis was achieved with cautery.  The frozen section returned negative.    A right subcostal incision was made extending up the midline.  The subcutaneous tissues were divided with the Bovie and then the fascia was opened with the Bovie as well. There were several spots on the muscle that were coagulated.  The Bookwalter was then placed for assistance with visualization. .  The adhesions were taken off the posterior abdominal wall. The patient had  had a prior lap chole.  The adhesions were taken down off the liver and around the porta with cautery.  Once the porta hepatis was exposed, the liver was retracted gently with the sweetheart retractor.  Adhesions of the right lobe were taken off the diaphragm.  It was apparent that the patient would not need a full right hepatectomy.    An umbilical tape was used to pass around the porta in case a pringle maneuver was needed.    The liver was scored  with the Bovie around 1-2 cm from the tumor.  The harmonic scalpel wave was then used to divide the bulk of the liver parenchyma.  The vascular pedicles were divided with Echelon staple loads.  The crush clamp technique was also used to help identify areas of potential bleeding.    The liver was taken completely off. The argon beam coagulator was used to coagulate the liver bed. This also was irrigated.  There was no evidence of biliary leakage.   A 19 Blake drain was placed in the abdomen. There was still no bile leakage seen after waiting and evaluating the liver bed with a dry lap.  Tisseel was then applied to the liver edge and the Blake drain was placed going around the cut edge of the surface and over into the hepatic fossa.  The omentum was also placed up over the top of the colon into this area.    The OnQ tunneler was placed on both sides of the incision.  The lateral portion was closed with 2 layers of #  1 looped PDS suture was created in a running fashion, and the midline was closed with 1 layer.  The Kirby drain was then in place with a 2-0 nylon.  The skin was then irrigated and stapled.  The left subcostal port was closed with a 4-0 monocryl.  The wounds were cleaned, dried and dressed with Covaderm.  The patient was extubated and taken to PACU in stable condition.     [name], MD

## 2015-06-14 NOTE — Anesthesia Preprocedure Evaluation (Addendum)
Anesthesia Evaluation  Patient identified by MRN, date of birth, ID band Patient awake    Reviewed: Allergy & Precautions, NPO status , Patient's Chart, lab work & pertinent test results  Airway Mallampati: II  TM Distance: >3 FB Neck ROM: Full    Dental no notable dental hx.    Pulmonary pneumonia, former smoker,    Pulmonary exam normal breath sounds clear to auscultation       Cardiovascular hypertension, Pt. on medications Normal cardiovascular exam Rhythm:Regular Rate:Normal     Neuro/Psych PSYCHIATRIC DISORDERS negative neurological ROS     GI/Hepatic Neg liver ROS, GERD  Medicated,  Endo/Other  Hypothyroidism   Renal/GU negative Renal ROS  negative genitourinary   Musculoskeletal negative musculoskeletal ROS (+)   Abdominal   Peds negative pediatric ROS (+)  Hematology negative hematology ROS (+)   Anesthesia Other Findings   Reproductive/Obstetrics negative OB ROS                            Anesthesia Physical Anesthesia Plan  ASA: III  Anesthesia Plan: General   Post-op Pain Management:    Induction: Intravenous  Airway Management Planned:   Additional Equipment:   Intra-op Plan:   Post-operative Plan: Extubation in OR  Informed Consent: I have reviewed the patients History and Physical, chart, labs and discussed the procedure including the risks, benefits and alternatives for the proposed anesthesia with the patient or authorized representative who has indicated his/her understanding and acceptance.   Dental advisory given  Plan Discussed with: CRNA  Anesthesia Plan Comments:         Anesthesia Quick Evaluation

## 2015-06-14 NOTE — H&P (Signed)
Destiny Velasquez 05/27/2015 11:40 AM Location: [facility] Patient #: [id number] DOB: November 21, 1925 Married / Language: English / Race: White Female   History of Present Illness [name] MD; 05/27/2015 2:02 PM) The patient is a 81 year old female who presents with a liver mass. Patient is an 81 year old female referred by Dr. Lindi Adie for a new diagnosis of a liver mass. The patient was having cramping abdominal pain and ended up getting a CAT scan. She was found to have diverticulitis and required hospitalization in September. Because of a suspicion of a mass in her right liver, she underwent MRI. Biopsy demonstrated adenocarcinoma. It is not clear if this is a metastatic lesion or a primary liver malignancy. She has had previous breast cancer, but this was 50 years ago. She does not have any heavy drinking history or other risk factors for hepatocellular cancer. She denies any right upper quadrant abdominal pain. She does still have some occasional cramping. She has had colonoscopies in the past, but had stopped doing them at the age of 20. She has had new constipation over the last 6-9 months. She also has had some unintentional weight loss of around 30 pounds over the last year.  Liver, needle/core biopsy, Right lobe of liver - POORLY DIFFERENTIATED CARCINOMA. - SEE MICROSCOPIC DESCRIPTION. Microscopic Comment The core biopsies show scattered nests of poorly differentiated carcinoma separated by large areas of collagenous fibrous tissue in which there is patchy chronic inflammation. Immunohistochemistry shows strong positivity with cytokeratin 8/18, cytokeratin 7 and MOC-31. Hepar-1 shows faint cytoplasmic staining and gross cystic disease fluid protein shows scattered microscopic foci of positivity. The tumor is negative with estrogen receptor, progesterone receptor, alpha fetoprotein, thyroid transcription factor-1, WT-1, p63, CD10, CDX-2, cytokeratin 20, and  cytokeratin 5/6. Mucicarmine is also negative. The differential includes metastatic carcinoma and with the few foci of GCDFP positivity, metastatic breast cannot be ruled out. Also primary hepatocellular carcinoma with unusual morphologic features cannot be ruled out. Clinical and radiographic correlation are essential.  PET IMPRESSION: 1. Hypermetabolic, partially necrotic right hepatic lobe mass. 2. Hypermetabolism within the descending/sigmoid colon junction, in the region of inflammation. Although this could be inflammatory and related to diverticulitis, the focality of the abnormality is suspicious for an underlying colonic carcinoma. Therefore, the liver lesion is favored to represent an isolated metastasis. Consider colonoscopy confirmation. 3. Hypermetabolism at the anal introitus could be physiologic but could also be better evaluated with colonoscopy and/or physical exam. 4. Esophageal air fluid level suggests dysmotility or gastroesophageal reflux. 5. Left pelvic sidewall cystic structure is favored to represent an incidental lymphangioma or seroma.   Other Problems Elbert Ewings, CMA; 05/27/2015 11:40 AM) Breast Cancer High blood pressure Hypercholesterolemia  Past Surgical History Elbert Ewings, CMA; 05/27/2015 11:40 AM) Appendectomy Breast Biopsy Left. Cataract Surgery Bilateral. Colon Polyp Removal - Open Gallbladder Surgery - Laparoscopic Hysterectomy (not due to cancer) - Complete Mastectomy Right. Tonsillectomy  Diagnostic Studies History Elbert Ewings, CMA; 05/27/2015 11:40 AM) Colonoscopy 1-5 years ago Mammogram within last year  Allergies Elbert Ewings, CMA; 05/27/2015 11:40 AM) No Known Drug Allergies10/14/2016  Medication History Elbert Ewings, CMA; 05/27/2015 11:43 AM) Premarin (0.'3MG'$  Tablet, Oral every other day) Active. Levothyroxine Sodium (100MCG Tablet, Oral daily before breakfast) Active. Cozaar ('50MG'$  Tablet, Oral)  Active. Simvastatin ('40MG'$  Tablet, Oral daily) Active. Spironolactone-HCTZ (25-'25MG'$  Tablet, Oral) Active. Vitamin D (2000UNIT Tablet, Oral) Active. Aspirin ('81MG'$  Tablet, Oral) Active. Beta Carotene ('15MG'$  Capsule, Oral) Active. Niacin ('500MG'$  Tablet, Oral) Active. Zofran ('4MG'$  Tablet, Oral) Active. Ensure (  Oral) Active. Ativan (0.'5MG'$  Tablet, Oral has not started yet) Active. Medications Reconciled  Social History Elbert Ewings, [place]; 05/27/2015 11:40 AM) Alcohol use Occasional alcohol use. Caffeine use Coffee. No drug use Tobacco use Former smoker.  Family History Elbert Ewings, [place]; 05/27/2015 11:40 AM) Cancer Mother. Cerebrovascular Accident Father. Cervical Cancer Mother. Heart Disease Father.  Pregnancy / Birth History Elbert Ewings, CMA; 05/27/2015 11:40 AM) Age at menarche 52 years. Age of menopause 73-55 Gravida 4 Maternal age 27-25 Para 2    Review of Systems Elbert Ewings CMA; 05/27/2015 11:40 AM) General Present- Appetite Loss and Weight Loss. Not Present- Chills, Fatigue, Fever, Night Sweats and Weight Gain. Skin Present- Dryness. Not Present- Change in Wart/Mole, Hives, Jaundice, New Lesions, Non-Healing Wounds, Rash and Ulcer. HEENT Not Present- Earache, Hearing Loss, Hoarseness, Nose Bleed, Oral Ulcers, Ringing in the Ears, Seasonal Allergies, Sinus Pain, Sore Throat, Visual Disturbances, Wears glasses/contact lenses and Yellow Eyes. Respiratory Not Present- Bloody sputum, Chronic Cough, Difficulty Breathing, Snoring and Wheezing. Breast Not Present- Breast Mass, Breast Pain, Nipple Discharge and Skin Changes. Cardiovascular Not Present- Chest Pain, Difficulty Breathing Lying Down, Leg Cramps, Palpitations, Rapid Heart Rate, Shortness of Breath and Swelling of Extremities. Gastrointestinal Present- Constipation. Not Present- Abdominal Pain, Bloating, Bloody Stool, Change in Bowel Habits, Chronic diarrhea, Difficulty Swallowing, Excessive gas, Gets  full quickly at meals, Hemorrhoids, Indigestion, Nausea, Rectal Pain and Vomiting. Female Genitourinary Not Present- Frequency, Nocturia, Painful Urination, Pelvic Pain and Urgency. Musculoskeletal Not Present- Back Pain, Joint Pain, Joint Stiffness, Muscle Pain, Muscle Weakness and Swelling of Extremities. Neurological Not Present- Decreased Memory, Fainting, Headaches, Numbness, Seizures, Tingling, Tremor, Trouble walking and Weakness. Psychiatric Not Present- Anxiety, Bipolar, Change in Sleep Pattern, Depression, Fearful and Frequent crying. Endocrine Not Present- Cold Intolerance, Excessive Hunger, Hair Changes, Heat Intolerance, Hot flashes and New Diabetes. Hematology Not Present- Easy Bruising, Excessive bleeding, Gland problems, HIV and Persistent Infections.  Vitals Elbert Ewings CMA; 05/27/2015 11:44 AM) 05/27/2015 11:43 AM Weight: 148.6 lb Height: 63in Body Surface Area: 1.73 m Body Mass Index: 26.32 kg/m  Temp.: 97.24F(Temporal)  Pulse: 66 (Regular)  BP: 124/62 (Sitting, Left Arm, Standard)     Physical Exam [name] MD; 05/27/2015 2:01 PM) General Mental Status-Alert. General Appearance-Consistent with stated age. Hydration-Well hydrated. Voice-Normal. Note: The patient is extremely mobile and looks younger than stated age. She does not use any assistive walking devices.   Head and Neck Head-normocephalic, atraumatic with no lesions or palpable masses. Trachea-midline. Thyroid Gland Characteristics - normal size and consistency.  Eye Eyeball - Bilateral-Extraocular movements intact. Sclera/Conjunctiva - Bilateral-No scleral icterus.  Chest and Lung Exam Chest and lung exam reveals -quiet, even and easy respiratory effort with no use of accessory muscles and on auscultation, normal breath sounds, no adventitious sounds and normal vocal resonance. Inspection Chest Wall - Normal. Back - normal.  Cardiovascular Cardiovascular  examination reveals -normal heart sounds, regular rate and rhythm with no murmurs and normal pedal pulses bilaterally.  Abdomen Inspection Inspection of the abdomen reveals - No Hernias. Palpation/Percussion Palpation and Percussion of the abdomen reveal - Soft, Non Tender, No Rebound tenderness, No Rigidity (guarding) and No hepatosplenomegaly. Auscultation Auscultation of the abdomen reveals - Bowel sounds normal.  Neurologic Neurologic evaluation reveals -alert and oriented x 3 with no impairment of recent or remote memory. Mental Status-Normal.  Musculoskeletal Global Assessment -Note: no gross deformities.  Normal Exam - Left-Upper Extremity Strength Normal and Lower Extremity Strength Normal. Normal Exam - Right-Upper Extremity Strength Normal and Lower Extremity Strength  Normal.  Lymphatic Head & Neck  General Head & Neck Lymphatics: Bilateral - Description - Normal. Axillary  General Axillary Region: Bilateral - Description - Normal. Tenderness - Non Tender. Femoral & Inguinal  Generalized Femoral & Inguinal Lymphatics: Bilateral - Description - No Generalized lymphadenopathy.    Assessment & Plan [name] MD; 05/27/2015 2:04 PM) ADENOCARCINOMA OF LIVER (C22.9) Impression: Although the patient is 57, she is in very good shape. I do think that it is reasonable to do a resection on her. I would like to have a colonoscopy to rule out metastatic disease. She has undergone PET scanning, and has some hypermetabolism in her descending colon. This may be related to diverticulitis, but I would not rule out metastatic disease. I do think it is important to get this even if we have to wait a little bit longer to do surgery. If she is going to go to the trouble of undergoing a big operation, I would definitely want to take out part of her colon as well.  This certainly may be hepatocellular or peripheral cholangiocarcinoma.  I thoroughly discussed risks of  surgery. I also discussed how her age may affect her recovery. Her daughter and husband were present.  The surgery was discussed with the patient with diagrams of anatomy. I reviewed the rationale for surgery, possible alternative options, possibility of having to abort the procedure, hospital course, post op restrictions, possible post op complications, possible need for post hospital stay at a nursing home or rehab, and possible death.  The complications can include: This is a very extensive operation and includes complications listed below: Bleeding Infection and possible wound complications such as hernia Damage to adjacent structures Leak of bile from the surface of the liver Possible need for other procedures, such as abscess drains in radiology or endoscopy. Possible prolonged hospital stay MOST PATIENTS' ENERGY LEVEL IS NOT BACK TO NORMAL FOR AT LEAST 4-6 MONTHS. OLDER PATIENTS MAY FEEL WEAK FOR LONGER PERIODS OF TIME. Difficulty with eating or post operative nausea (around 30%) Possible early recurrence of cancer Possible complications of your medical problems such as heart disease or arrhythmias. Death (less than 2%)  She would like to undergo surgery. I discussed that if she is still wants to do this after her colonoscopy, that we should se Current Plans Referred to Gastroenterology, for evaluation and follow up Physiological scientist). Pt Education - flb hepatectomy: discussed with patient and provided information. Pt Education - [facility] Free Text Education/Instructions: discussed with patient and provided information.   Signed by [name], MD (05/27/2015 2:05 PM)

## 2015-06-14 NOTE — Anesthesia Procedure Notes (Signed)
Procedure Name: Intubation Date/Time: 06/14/2015 1:21 PM Performed by: Anne Fu Pre-anesthesia Checklist: Patient identified, Emergency Drugs available, Suction available, Patient being monitored and Timeout performed Patient Re-evaluated:Patient Re-evaluated prior to inductionOxygen Delivery Method: Circle system utilized Preoxygenation: Pre-oxygenation with 100% oxygen Intubation Type: IV induction Ventilation: Mask ventilation without difficulty Laryngoscope Size: Mac and 4 Grade View: Grade III Tube type: Oral Tube size: 7.5 mm Number of attempts: 1 Airway Equipment and Method: Stylet and Video-laryngoscopy Placement Confirmation: ETT inserted through vocal cords under direct vision,  positive ETCO2,  CO2 detector and breath sounds checked- equal and bilateral Secured at: 18 cm Tube secured with: Tape Dental Injury: Teeth and Oropharynx as per pre-operative assessment  Difficulty Due To: Difficult Airway- due to anterior larynx Comments: Noted possible anterior larynx on visional exam.  DL X 1 with MAC 4 unable to see opening attempted with bougie without success.  MD Delma Post DL X 2 with Grade III view MAC/Miller converted to video-laryngoscopy X 1 with GRADE I view.

## 2015-06-14 NOTE — Progress Notes (Signed)
Pt reports continuous, clear, nasal drainage (large amount) for 3 days post colonoscopy (recently).

## 2015-06-14 NOTE — Transfer of Care (Signed)
Immediate Anesthesia Transfer of Care Note  Patient: Destiny Velasquez  Procedure(s) Performed: Procedure(s) with comments: LAPAROSCOPY DIAGNOSTIC (N/A)  OPEN PARTIAL RIGHT HEPATECTOMY (N/A) - converted to open @ 1447 LIVER BIOPSY  Patient Location: PACU  Anesthesia Type:General  Level of Consciousness:  sedated, patient cooperative and responds to stimulation  Airway & Oxygen Therapy:Patient Spontanous Breathing and Patient connected to face mask oxgen  Post-op Assessment:  Report given to PACU RN and Post -op Vital signs reviewed and stable  Post vital signs:  Reviewed and stable  Last Vitals:  Filed Vitals:   06/14/15 1037  BP: 137/61  Pulse: 83  Temp: 36.3 C  Resp: 18    Complications: No apparent anesthesia complications

## 2015-06-15 ENCOUNTER — Encounter (HOSPITAL_COMMUNITY): Payer: Self-pay | Admitting: General Surgery

## 2015-06-15 LAB — CBC
HCT: 30.5 % — ABNORMAL LOW (ref 36.0–46.0)
Hemoglobin: 9.7 g/dL — ABNORMAL LOW (ref 12.0–15.0)
MCH: 27.9 pg (ref 26.0–34.0)
MCHC: 31.8 g/dL (ref 30.0–36.0)
MCV: 87.6 fL (ref 78.0–100.0)
PLATELETS: 229 10*3/uL (ref 150–400)
RBC: 3.48 MIL/uL — AB (ref 3.87–5.11)
RDW: 12.9 % (ref 11.5–15.5)
WBC: 16.5 10*3/uL — ABNORMAL HIGH (ref 4.0–10.5)

## 2015-06-15 LAB — COMPREHENSIVE METABOLIC PANEL
ALT: 615 U/L — AB (ref 14–54)
ANION GAP: 7 (ref 5–15)
AST: 719 U/L — ABNORMAL HIGH (ref 15–41)
Albumin: 2.6 g/dL — ABNORMAL LOW (ref 3.5–5.0)
Alkaline Phosphatase: 72 U/L (ref 38–126)
BUN: 19 mg/dL (ref 6–20)
CHLORIDE: 103 mmol/L (ref 101–111)
CO2: 24 mmol/L (ref 22–32)
CREATININE: 1.54 mg/dL — AB (ref 0.44–1.00)
Calcium: 9 mg/dL (ref 8.9–10.3)
GFR calc non Af Amer: 29 mL/min — ABNORMAL LOW (ref >60)
GFR, EST AFRICAN AMERICAN: 34 mL/min — AB (ref >60)
Glucose, Bld: 258 mg/dL — ABNORMAL HIGH (ref 65–99)
POTASSIUM: 4.6 mmol/L (ref 3.5–5.1)
SODIUM: 134 mmol/L — AB (ref 135–145)
Total Bilirubin: 0.8 mg/dL (ref 0.3–1.2)
Total Protein: 5.4 g/dL — ABNORMAL LOW (ref 6.5–8.1)

## 2015-06-15 LAB — MAGNESIUM: MAGNESIUM: 1.5 mg/dL — AB (ref 1.7–2.4)

## 2015-06-15 LAB — APTT: aPTT: 28 seconds (ref 24–37)

## 2015-06-15 LAB — PHOSPHORUS: PHOSPHORUS: 3.7 mg/dL (ref 2.5–4.6)

## 2015-06-15 MED ORDER — ALBUMIN HUMAN 5 % IV SOLN
25.0000 g | Freq: Once | INTRAVENOUS | Status: AC
  Administered 2015-06-15: 25 g via INTRAVENOUS
  Filled 2015-06-15: qty 500

## 2015-06-15 MED ORDER — MAGNESIUM SULFATE 2 GM/50ML IV SOLN
2.0000 g | Freq: Once | INTRAVENOUS | Status: AC
Start: 2015-06-15 — End: 2015-06-15
  Administered 2015-06-15: 2 g via INTRAVENOUS
  Filled 2015-06-15: qty 50

## 2015-06-15 MED ORDER — ALBUMIN HUMAN 25 % IV SOLN
25.0000 g | Freq: Four times a day (QID) | INTRAVENOUS | Status: AC
  Administered 2015-06-15 – 2015-06-17 (×7): 25 g via INTRAVENOUS
  Filled 2015-06-15: qty 50
  Filled 2015-06-15: qty 100
  Filled 2015-06-15 (×2): qty 50
  Filled 2015-06-15: qty 100
  Filled 2015-06-15 (×5): qty 50
  Filled 2015-06-15: qty 100

## 2015-06-15 MED ORDER — PROCHLORPERAZINE EDISYLATE 5 MG/ML IJ SOLN
5.0000 mg | INTRAMUSCULAR | Status: DC | PRN
  Administered 2015-06-15: 5 mg via INTRAVENOUS
  Filled 2015-06-15: qty 2

## 2015-06-15 MED ORDER — FENTANYL CITRATE (PF) 100 MCG/2ML IJ SOLN
12.5000 ug | INTRAMUSCULAR | Status: DC | PRN
  Administered 2015-06-15: 25 ug via INTRAVENOUS
  Administered 2015-06-15: 12.5 ug via INTRAVENOUS
  Administered 2015-06-15 (×2): 25 ug via INTRAVENOUS
  Administered 2015-06-16: 12.5 ug via INTRAVENOUS
  Administered 2015-06-16 – 2015-06-18 (×8): 25 ug via INTRAVENOUS
  Filled 2015-06-15 (×13): qty 2

## 2015-06-15 NOTE — Anesthesia Postprocedure Evaluation (Signed)
  Anesthesia Post-op Note  Patient: Destiny Velasquez  Procedure(s) Performed: Procedure(s) (LRB): LAPAROSCOPY DIAGNOSTIC (N/A)  OPEN PARTIAL RIGHT HEPATECTOMY (N/A) LIVER BIOPSY  Patient Location: PACU  Anesthesia Type: General  Level of Consciousness: awake and alert   Airway and Oxygen Therapy: Patient Spontanous Breathing  Post-op Pain: mild  Post-op Assessment: Post-op Vital signs reviewed, Patient's Cardiovascular Status Stable, Respiratory Function Stable, Patent Airway and No signs of Nausea or vomiting  Last Vitals:  Filed Vitals:   06/15/15 0000  BP:   Pulse:   Temp: 36.1 C  Resp:     Post-op Vital Signs: stable   Complications: No apparent anesthesia complications

## 2015-06-15 NOTE — Progress Notes (Signed)
1 Day Post-Op  Subjective: Had some hypotension overnight.  Bumped Cr this AM, though UOP adequate.  Pain 0/10.  Threw up this AM.    Objective: Vital signs in last 24 hours: Temp:  [96.7 F (35.9 C)-97.5 F (36.4 C)] 96.7 F (35.9 C) (11/02 0400) Pulse Rate:  [83-112] 92 (11/02 0700) Resp:  [6-23] 9 (11/02 0700) BP: (80-143)/(35-61) 119/42 mmHg (11/02 0700) SpO2:  [95 %-100 %] 100 % (11/02 0700) Arterial Line BP: (68-117)/(44-86) 98/86 mmHg (11/02 0700) Weight:  [65.318 kg (144 lb)] 65.318 kg (144 lb) (11/01 1037) Last BM Date:  (PTA)  Intake/Output from previous day: 11/01 0701 - 11/02 0700 In: 5300 [I.V.:5000; IV Piggyback:300] Out: 1485 [Urine:750; Drains:235; Blood:500] Intake/Output this shift:    General appearance: alert, cooperative and mild distress  CV RR&R Resp- breathing comfortably Abd - soft, non tender, moderately distended.  JP dark blood. OnQ in place Ext - no C/C/E Neuro/psych - alert and oriented x 3.  Able to joke.  Does not seem confused.  Lab Results:   Recent Labs  06/13/15 0830 06/15/15 0510  WBC 10.9* 16.5*  HGB 14.1 9.7*  HCT 41.9 30.5*  PLT 216 229   BMET  Recent Labs  06/13/15 0830 06/15/15 0510  NA 135 134*  K 4.4 4.6  CL 99* 103  CO2 29 24  GLUCOSE 140* 258*  BUN 15 19  CREATININE 1.06* 1.54*  CALCIUM 10.3 9.0   PT/INR  Recent Labs  06/13/15 0830  LABPROT 14.2  INR 1.08   ABG No results for input(s): PHART, HCO3 in the last 72 hours.  Invalid input(s): PCO2, PO2  Studies/Results: No results found.  Anti-infectives: Anti-infectives    Start     Dose/Rate Route Frequency Ordered Stop   06/14/15 2200  ceFAZolin (ANCEF) IVPB 2 g/50 mL premix     2 g 100 mL/hr over 30 Minutes Intravenous 3 times per day 06/14/15 1844 06/15/15 0119   06/14/15 1036  ceFAZolin (ANCEF) IVPB 2 g/50 mL premix     2 g 100 mL/hr over 30 Minutes Intravenous On call to O.R. 06/14/15 1036 06/14/15 1335      Assessment/Plan: s/p  Procedure(s) with comments: LAPAROSCOPY DIAGNOSTIC (N/A)  OPEN PARTIAL RIGHT HEPATECTOMY (N/A) - converted to open @ 1447 LIVER BIOPSY Continue foley due to strict I&O, patient in ICU and urinary output monitoring  D/c pca.  Change to fentanyl Albumin for hypotension, hypoalbuminemia Replete mag for hypomagnesemia Keep in ICU for monitoring.   Add compazine for n/v.   LOS: 1 day    [facility] 06/15/2015

## 2015-06-15 NOTE — Care Management Note (Signed)
Case Management Note  Patient Details  Name: Destiny Velasquez MRN: [id number] Date of Birth: 10/26/1925  Subjective/Objective:        Ca of the liver with resection, hypotensive post-op hemodynamic monitoring            Action/Plan:Date: June 15, 2015 Chart reviewed for concurrent status and case management needs. Will continue to follow patient for changes and needs: Velva Harman, RN, BSN, [place]   [phone]   Expected Discharge Date:                  Expected Discharge Plan:  Home/Self Care  In-House Referral:  NA  Discharge planning Services  CM Consult  Post Acute Care Choice:  NA Choice offered to:  NA  DME Arranged:    DME Agency:     HH Arranged:    HH Agency:     Status of Service:  In process, will continue to follow  Medicare Important Message Given:    Date Medicare IM Given:    Medicare IM give by:    Date Additional Medicare IM Given:    Additional Medicare Important Message give by:     If discussed at [place] of Stay Meetings, dates discussed:    Additional Comments:  Leeroy Cha, RN 06/15/2015, 8:41 AM

## 2015-06-16 ENCOUNTER — Ambulatory Visit: Payer: Medicare Other | Admitting: Internal Medicine

## 2015-06-16 ENCOUNTER — Other Ambulatory Visit (HOSPITAL_COMMUNITY): Payer: Medicare Other

## 2015-06-16 ENCOUNTER — Inpatient Hospital Stay (HOSPITAL_COMMUNITY): Payer: Medicare Other

## 2015-06-16 LAB — COMPREHENSIVE METABOLIC PANEL
ALBUMIN: 4 g/dL (ref 3.5–5.0)
ALT: 401 U/L — ABNORMAL HIGH (ref 14–54)
ANION GAP: 4 — AB (ref 5–15)
AST: 372 U/L — ABNORMAL HIGH (ref 15–41)
Alkaline Phosphatase: 67 U/L (ref 38–126)
BILIRUBIN TOTAL: 1.2 mg/dL (ref 0.3–1.2)
BUN: 11 mg/dL (ref 6–20)
CHLORIDE: 104 mmol/L (ref 101–111)
CO2: 28 mmol/L (ref 22–32)
Calcium: 9.5 mg/dL (ref 8.9–10.3)
Creatinine, Ser: 1.09 mg/dL — ABNORMAL HIGH (ref 0.44–1.00)
GFR calc Af Amer: 51 mL/min — ABNORMAL LOW (ref >60)
GFR, EST NON AFRICAN AMERICAN: 44 mL/min — AB (ref >60)
GLUCOSE: 153 mg/dL — AB (ref 65–99)
POTASSIUM: 4.2 mmol/L (ref 3.5–5.1)
Sodium: 136 mmol/L (ref 135–145)
TOTAL PROTEIN: 5.7 g/dL — AB (ref 6.5–8.1)

## 2015-06-16 LAB — PREPARE RBC (CROSSMATCH)

## 2015-06-16 LAB — CBC
HEMATOCRIT: 22.2 % — AB (ref 36.0–46.0)
Hemoglobin: 7.2 g/dL — ABNORMAL LOW (ref 12.0–15.0)
MCH: 28.5 pg (ref 26.0–34.0)
MCHC: 32.4 g/dL (ref 30.0–36.0)
MCV: 87.7 fL (ref 78.0–100.0)
PLATELETS: 157 10*3/uL (ref 150–400)
RBC: 2.53 MIL/uL — ABNORMAL LOW (ref 3.87–5.11)
RDW: 13 % (ref 11.5–15.5)
WBC: 10.7 10*3/uL — ABNORMAL HIGH (ref 4.0–10.5)

## 2015-06-16 LAB — HEMOGLOBIN AND HEMATOCRIT, BLOOD
HCT: 21.9 % — ABNORMAL LOW (ref 36.0–46.0)
HEMOGLOBIN: 7.3 g/dL — AB (ref 12.0–15.0)

## 2015-06-16 MED ORDER — SODIUM CHLORIDE 0.9 % IV SOLN
Freq: Once | INTRAVENOUS | Status: AC
  Administered 2015-06-16: 12:00:00 via INTRAVENOUS

## 2015-06-16 MED ORDER — FUROSEMIDE 10 MG/ML IJ SOLN
20.0000 mg | Freq: Once | INTRAMUSCULAR | Status: AC
  Administered 2015-06-16: 20 mg via INTRAVENOUS
  Filled 2015-06-16: qty 2

## 2015-06-16 NOTE — Progress Notes (Signed)
Patient ID: Destiny Velasquez, female   DOB: 1925/11/09, 81 y.o.   MRN: [id number] 2 Days Post-Op  Subjective: Nausea resolved.  Required some fentanyl last night.  RN thinks there may be a slight facial droop this AM.     Objective: Vital signs in last 24 hours: Temp:  [97.4 F (36.3 C)-99.1 F (37.3 C)] 98.1 F (36.7 C) (11/03 0800) Pulse Rate:  [88-110] 100 (11/03 0748) Resp:  [9-21] 16 (11/03 0748) BP: (107-156)/(37-69) 136/51 mmHg (11/03 0748) SpO2:  [90 %-100 %] 95 % (11/03 0748) Arterial Line BP: (86-178)/(43-138) 135/61 mmHg (11/03 0748) Last BM Date: 06/13/15  Intake/Output from previous day: 11/02 0701 - 11/03 0700 In: 3200 [I.V.:2400; IV Piggyback:800] Out: 3005 [Urine:2775; Drains:230] Intake/Output this shift:    General appearance: alert, cooperative and mild distress  CV RR&R Resp- breathing comfortably Abd - soft, non tender, moderately distended.  JP dark blood. OnQ in place Ext - no C/C/E Neuro/psych - alert and oriented x 3.  There may be a mild facial droop.  No other neuro deficits.  Lab Results:   Recent Labs  06/15/15 0510 06/16/15 0510  WBC 16.5* 10.7*  HGB 9.7* 7.2*  HCT 30.5* 22.2*  PLT 229 157   BMET  Recent Labs  06/15/15 0510 06/16/15 0510  NA 134* 136  K 4.6 4.2  CL 103 104  CO2 24 28  GLUCOSE 258* 153*  BUN 19 11  CREATININE 1.54* 1.09*  CALCIUM 9.0 9.5   PT/INR  Recent Labs  06/13/15 0830  LABPROT 14.2  INR 1.08   ABG No results for input(s): PHART, HCO3 in the last 72 hours.  Invalid input(s): PCO2, PO2  Studies/Results: No results found.  Anti-infectives: Anti-infectives    Start     Dose/Rate Route Frequency Ordered Stop   06/14/15 2200  ceFAZolin (ANCEF) IVPB 2 g/50 mL premix     2 g 100 mL/hr over 30 Minutes Intravenous 3 times per day 06/14/15 1844 06/15/15 0119   06/14/15 1036  ceFAZolin (ANCEF) IVPB 2 g/50 mL premix     2 g 100 mL/hr over 30 Minutes Intravenous On call to O.R. 06/14/15 1036 06/14/15 1335       Assessment/Plan: s/p Procedure(s) with comments: LAPAROSCOPY DIAGNOSTIC (N/A)  OPEN PARTIAL RIGHT HEPATECTOMY (N/A) - converted to open @ 1447 LIVER BIOPSY Continue foley due to strict I&O, patient in ICU and urinary output monitoring   fentanyl, tylenol and OnQ for pain. Albumin for hypotension, hypoalbuminemia, to finish today. Transfer to stepdown. D/c art line. Decrease IVF Recheck H&H since it dropped significantly.  ? Validity.  May need transfusion. Head CT for facial droop.  Consider neuro consult.     LOS: 2 days    [facility] 06/16/2015

## 2015-06-16 NOTE — Progress Notes (Signed)
Upon entering patient's room for bedside report, right side of pt's face appeared to have a slight droop and her speech was slightly slurred.  Pt is oriented x4, pupils are equal and react to light.  Bil hand grips present, seems stronger on left.  Dr Barry Dienes came to room and informed of facial droop.  Will get head CT.  Daughter in room and update given.

## 2015-06-17 LAB — CBC
HEMATOCRIT: 31.2 % — AB (ref 36.0–46.0)
HEMOGLOBIN: 10.5 g/dL — AB (ref 12.0–15.0)
MCH: 28.6 pg (ref 26.0–34.0)
MCHC: 33.7 g/dL (ref 30.0–36.0)
MCV: 85 fL (ref 78.0–100.0)
PLATELETS: 181 10*3/uL (ref 150–400)
RBC: 3.67 MIL/uL — AB (ref 3.87–5.11)
RDW: 13.8 % (ref 11.5–15.5)
WBC: 13 10*3/uL — AB (ref 4.0–10.5)

## 2015-06-17 LAB — TYPE AND SCREEN
ABO/RH(D): B POS
Antibody Screen: NEGATIVE
UNIT DIVISION: 0
UNIT DIVISION: 0
Unit division: 0
Unit division: 0

## 2015-06-17 LAB — COMPREHENSIVE METABOLIC PANEL
ALK PHOS: 71 U/L (ref 38–126)
ALT: 204 U/L — AB (ref 14–54)
AST: 112 U/L — AB (ref 15–41)
Albumin: 4.7 g/dL (ref 3.5–5.0)
Anion gap: 6 (ref 5–15)
BUN: 9 mg/dL (ref 6–20)
CALCIUM: 10.2 mg/dL (ref 8.9–10.3)
CHLORIDE: 104 mmol/L (ref 101–111)
CO2: 27 mmol/L (ref 22–32)
CREATININE: 0.84 mg/dL (ref 0.44–1.00)
GFR calc Af Amer: >60 mL/min (ref >60)
Glucose, Bld: 130 mg/dL — ABNORMAL HIGH (ref 65–99)
Potassium: 4.2 mmol/L (ref 3.5–5.1)
Sodium: 137 mmol/L (ref 135–145)
Total Bilirubin: 1.9 mg/dL — ABNORMAL HIGH (ref 0.3–1.2)
Total Protein: 6.1 g/dL — ABNORMAL LOW (ref 6.5–8.1)

## 2015-06-17 MED ORDER — BUPIVACAINE 0.5 % ON-Q PUMP DUAL CATH 300 ML
300.0000 mL | INJECTION | Status: DC
  Filled 2015-06-17: qty 300

## 2015-06-17 NOTE — Progress Notes (Signed)
Received patient from ICU via wheelchair, patient awake, alert, oriented x 4.Patient wants to sit on chair, lunch served,

## 2015-06-17 NOTE — Progress Notes (Signed)
Patient ID: Destiny Velasquez, female   DOB: [birth year], 81 y.o.   MRN: [id number] 3 Days Post-Op  Subjective: Head CT negative for acute infarct.  Felt "weird" last night.  Got a good night's sleep with medication.      Objective: Vital signs in last 24 hours: Temp:  [98 F (36.7 C)-99.2 F (37.3 C)] 98 F (36.7 C) (11/04 0800) Pulse Rate:  [93-105] 101 (11/04 0700) Resp:  [13-25] 25 (11/04 0840) BP: (122-188)/(46-96) 175/64 mmHg (11/04 0840) SpO2:  [90 %-100 %] 93 % (11/04 0840) Last BM Date: 06/13/15  Intake/Output from previous day: 11/03 0701 - 11/04 0700 In: 3040 [P.O.:490; I.V.:1580; Blood:670; IV Piggyback:300] Out: 3070 [Urine:2950; Drains:120] Intake/Output this shift: Total I/O In: 175 [I.V.:75; IV Piggyback:100] Out: -   No distress CV RR&R Resp- breathing comfortably Abd - soft, non tender, moderately distended.  JP dark blood. OnQ in place Ext - no C/C/E Neuro/psych - alert and oriented x 3.  Facial droop appears gone.    Lab Results:   Recent Labs  06/16/15 0510 06/16/15 0828 06/17/15 0403  WBC 10.7*  --  13.0*  HGB 7.2* 7.3* 10.5*  HCT 22.2* 21.9* 31.2*  PLT 157  --  181   BMET  Recent Labs  06/16/15 0510 06/17/15 0403  NA 136 137  K 4.2 4.2  CL 104 104  CO2 28 27  GLUCOSE 153* 130*  BUN 11 9  CREATININE 1.09* 0.84  CALCIUM 9.5 10.2   PT/INR No results for input(s): LABPROT, INR in the last 72 hours. ABG No results for input(s): PHART, HCO3 in the last 72 hours.  Invalid input(s): PCO2, PO2  Studies/Results: Ct Head Wo Contrast  06/16/2015  CLINICAL DATA:  Mild facial droop, no other neurological deficits EXAM: CT HEAD WITHOUT CONTRAST TECHNIQUE: Contiguous axial images were obtained from the base of the skull through the vertex without intravenous contrast. COMPARISON:  None. FINDINGS: Diffuse age-related cortical atrophy as well as diffuse low attenuation in the deep white matter. There is no evidence of vascular territory cortical  infarct. There is no evidence of mass. There is no hemorrhage or extra-axial fluid. There is no hydrocephalus. Sub cm focus of low attenuation left pons. Sub cm calcified meningioma right frontal region. Calvarium intact. No significant inflammatory change in the visualized portions of the paranasal sinuses. IMPRESSION: Chronic involutional change with no hemorrhage or vascular territory infarct. Tiny focus of low attenuation left pons measuring a few mm may reflect another focus of chronic white matter degeneration versus tiny lacunar infarct of uncertain age. This could be better evaluated with MRI if indicated. Electronically Signed   By: Skipper Cliche M.D.   On: 06/16/2015 08:38    Anti-infectives: Anti-infectives    Start     Dose/Rate Route Frequency Ordered Stop   06/14/15 2200  ceFAZolin (ANCEF) IVPB 2 g/50 mL premix     2 g 100 mL/hr over 30 Minutes Intravenous 3 times per day 06/14/15 1844 06/15/15 0119   06/14/15 1036  ceFAZolin (ANCEF) IVPB 2 g/50 mL premix     2 g 100 mL/hr over 30 Minutes Intravenous On call to O.R. 06/14/15 1036 06/14/15 1335      Assessment/Plan: s/p Procedure(s) with comments: LAPAROSCOPY DIAGNOSTIC (N/A)  OPEN PARTIAL RIGHT HEPATECTOMY (N/A) - converted to open @ 1447 LIVER BIOPSY D/c foley Transfer to floor.  fentanyl, tylenol and OnQ for pain. Refill OnQ Decrease IVF  LOS: 3 days    [facility] 06/17/2015

## 2015-06-18 ENCOUNTER — Encounter (HOSPITAL_COMMUNITY): Payer: Self-pay | Admitting: Surgery

## 2015-06-18 DIAGNOSIS — K219 Gastro-esophageal reflux disease without esophagitis: Secondary | ICD-10-CM | POA: Diagnosis present

## 2015-06-18 DIAGNOSIS — R410 Disorientation, unspecified: Secondary | ICD-10-CM | POA: Diagnosis present

## 2015-06-18 DIAGNOSIS — F05 Delirium due to known physiological condition: Secondary | ICD-10-CM

## 2015-06-18 LAB — CBC
HEMATOCRIT: 37.5 % (ref 36.0–46.0)
Hemoglobin: 12.6 g/dL (ref 12.0–15.0)
MCH: 28.4 pg (ref 26.0–34.0)
MCHC: 33.6 g/dL (ref 30.0–36.0)
MCV: 84.5 fL (ref 78.0–100.0)
PLATELETS: 258 10*3/uL (ref 150–400)
RBC: 4.44 MIL/uL (ref 3.87–5.11)
RDW: 13.7 % (ref 11.5–15.5)
WBC: 17.9 10*3/uL — AB (ref 4.0–10.5)

## 2015-06-18 LAB — COMPREHENSIVE METABOLIC PANEL
ALT: 156 U/L — ABNORMAL HIGH (ref 14–54)
ANION GAP: 9 (ref 5–15)
AST: 56 U/L — AB (ref 15–41)
Albumin: 4.3 g/dL (ref 3.5–5.0)
Alkaline Phosphatase: 97 U/L (ref 38–126)
BILIRUBIN TOTAL: 2.1 mg/dL — AB (ref 0.3–1.2)
BUN: 10 mg/dL (ref 6–20)
CALCIUM: 10.7 mg/dL — AB (ref 8.9–10.3)
CO2: 25 mmol/L (ref 22–32)
CREATININE: 0.96 mg/dL (ref 0.44–1.00)
Chloride: 100 mmol/L — ABNORMAL LOW (ref 101–111)
GFR calc Af Amer: 60 mL/min — ABNORMAL LOW (ref >60)
GFR, EST NON AFRICAN AMERICAN: 52 mL/min — AB (ref >60)
GLUCOSE: 141 mg/dL — AB (ref 65–99)
POTASSIUM: 4.1 mmol/L (ref 3.5–5.1)
Sodium: 134 mmol/L — ABNORMAL LOW (ref 135–145)
Total Protein: 6.6 g/dL (ref 6.5–8.1)

## 2015-06-18 LAB — LIPASE, BLOOD: LIPASE: 27 U/L (ref 11–51)

## 2015-06-18 LAB — AMMONIA: Ammonia: 19 umol/L (ref 9–35)

## 2015-06-18 MED ORDER — SACCHAROMYCES BOULARDII 250 MG PO CAPS
250.0000 mg | ORAL_CAPSULE | Freq: Two times a day (BID) | ORAL | Status: DC
  Administered 2015-06-18 – 2015-06-22 (×9): 250 mg via ORAL
  Filled 2015-06-18 (×11): qty 1

## 2015-06-18 MED ORDER — METOCLOPRAMIDE HCL 5 MG/ML IJ SOLN: 5.0000 mg | Freq: Four times a day (QID) | INTRAMUSCULAR | Status: DC | PRN

## 2015-06-18 MED ORDER — BOOST / RESOURCE BREEZE PO LIQD
1.0000 | Freq: Three times a day (TID) | ORAL | Status: DC
  Administered 2015-06-18 – 2015-06-19 (×2): 1 via ORAL

## 2015-06-18 MED ORDER — SODIUM CHLORIDE 0.9 % IJ SOLN
3.0000 mL | INTRAMUSCULAR | Status: DC | PRN
  Administered 2015-06-19: 3 mL via INTRAVENOUS
  Filled 2015-06-18: qty 3

## 2015-06-18 MED ORDER — LOSARTAN POTASSIUM 50 MG PO TABS
25.0000 mg | ORAL_TABLET | Freq: Every day | ORAL | Status: DC
Start: 2015-06-18 — End: 2015-06-23
  Administered 2015-06-18 – 2015-06-23 (×6): 25 mg via ORAL
  Filled 2015-06-18 (×6): qty 1

## 2015-06-18 MED ORDER — BISACODYL 10 MG RE SUPP: 10.0000 mg | Freq: Two times a day (BID) | RECTAL | Status: DC | PRN

## 2015-06-18 MED ORDER — MAGIC MOUTHWASH
15.0000 mL | Freq: Four times a day (QID) | ORAL | Status: DC | PRN
  Filled 2015-06-18: qty 15

## 2015-06-18 MED ORDER — ALUM & MAG HYDROXIDE-SIMETH 200-200-20 MG/5ML PO SUSP
30.0000 mL | Freq: Four times a day (QID) | ORAL | Status: DC | PRN
  Administered 2015-06-20 – 2015-06-21 (×2): 30 mL via ORAL
  Filled 2015-06-18 (×2): qty 30

## 2015-06-18 MED ORDER — PHENOL 1.4 % MT LIQD: 2.0000 | OROMUCOSAL | Status: DC | PRN

## 2015-06-18 MED ORDER — ONDANSETRON 4 MG PO TBDP
4.0000 mg | ORAL_TABLET | Freq: Four times a day (QID) | ORAL | Status: DC | PRN
Start: 2015-06-18 — End: 2015-06-23

## 2015-06-18 MED ORDER — TRAMADOL HCL 50 MG PO TABS: 50.0000 mg | ORAL_TABLET | Freq: Four times a day (QID) | ORAL | Status: DC | PRN

## 2015-06-18 MED ORDER — PROSIGHT PO TABS
1.0000 | ORAL_TABLET | Freq: Every day | ORAL | Status: DC
  Administered 2015-06-18 – 2015-06-21 (×2): 1 via ORAL
  Filled 2015-06-18 (×6): qty 1

## 2015-06-18 MED ORDER — MENTHOL 3 MG MT LOZG: 1.0000 | LOZENGE | OROMUCOSAL | Status: DC | PRN

## 2015-06-18 MED ORDER — LACTATED RINGERS IV BOLUS (SEPSIS): 1000.0000 mL | Freq: Three times a day (TID) | INTRAVENOUS | Status: AC | PRN

## 2015-06-18 MED ORDER — ONDANSETRON HCL 4 MG PO TABS: 4.0000 mg | ORAL_TABLET | Freq: Four times a day (QID) | ORAL | Status: DC | PRN

## 2015-06-18 MED ORDER — ASPIRIN 81 MG PO CHEW
81.0000 mg | CHEWABLE_TABLET | Freq: Every day | ORAL | Status: DC
  Administered 2015-06-18 – 2015-06-22 (×4): 81 mg via ORAL
  Filled 2015-06-18 (×11): qty 1

## 2015-06-18 MED ORDER — IBUPROFEN 200 MG PO TABS
400.0000 mg | ORAL_TABLET | Freq: Four times a day (QID) | ORAL | Status: DC | PRN
Start: 2015-06-18 — End: 2015-06-22

## 2015-06-18 MED ORDER — LIP MEDEX EX OINT
1.0000 "application " | TOPICAL_OINTMENT | Freq: Two times a day (BID) | CUTANEOUS | Status: DC
  Administered 2015-06-18 – 2015-06-22 (×10): 1 via TOPICAL
  Filled 2015-06-18 (×3): qty 7

## 2015-06-18 MED ORDER — LACTULOSE 10 GM/15ML PO SOLN: 20.0000 g | Freq: Two times a day (BID) | ORAL | Status: DC | PRN

## 2015-06-18 MED ORDER — SODIUM CHLORIDE 0.9 % IJ SOLN
3.0000 mL | Freq: Two times a day (BID) | INTRAMUSCULAR | Status: DC
  Administered 2015-06-18 – 2015-06-22 (×7): 3 mL via INTRAVENOUS

## 2015-06-18 MED ORDER — HYDROMORPHONE HCL 1 MG/ML IJ SOLN
0.5000 mg | INTRAMUSCULAR | Status: DC | PRN
  Administered 2015-06-21 (×2): 1 mg via INTRAVENOUS
  Administered 2015-06-22: 0.5 mg via INTRAVENOUS
  Filled 2015-06-18: qty 2
  Filled 2015-06-18 (×2): qty 1

## 2015-06-18 MED ORDER — SODIUM CHLORIDE 0.9 % IV SOLN: 250.0000 mL | INTRAVENOUS | Status: DC | PRN

## 2015-06-18 MED ORDER — NIACIN 500 MG PO TABS
500.0000 mg | ORAL_TABLET | Freq: Every day | ORAL | Status: DC
  Administered 2015-06-21: 500 mg via ORAL
  Filled 2015-06-18 (×6): qty 1

## 2015-06-18 NOTE — Progress Notes (Signed)
[facility]  [address] [place], [place] [address] Phone: [phone] FAX: [phone]   Hadasah Brugger [id number] November 26, 1925   Problem List:   Active Problems:   Adenocarcinoma of liver ([place])   4 Days Post-Op  06/14/2015  PREOPERATIVE DIAGNOSIS: Adenocarcinoma of the liver, presumed primary.  POSTOPERATIVE DIAGNOSIS: Same  PROCEDURE PERFORMED: Diagnostic laparoscopy, partial right hepatic lobectomy  SURGEON: [name], MD  ASSISTANT: Jackolyn Confer, MD  Assessment  MS fair but a little better  Ileus  Plan:  -follow MS.  Slightly improved.  Check ammonia Switch meds.  No evidence of hypoxia.  No anemia.  Mild transaminitis but no evidence of liver failure.  No obstructive kidney failure.  Blood pressure fine.  If worsens, may need more aggressive evaluation.  No focal deficits though.  -adv diet slowly  -VTE prophylaxis- SCDs, etc  -mobilize as tolerated to help recovery  Adin Hector, M.D., F.A.C.S. Gastrointestinal and Minimally Invasive Surgery [facility] [address], [place], [place] [address] [phone] Main / Paging   06/18/2015  Subjective:  Sleepy Daughter in room Trying liquids   Objective:  Vital signs:  Filed Vitals:   06/17/15 2116 06/17/15 2140 06/18/15 0538 06/18/15 0645  BP: 168/65  187/84 158/76  Pulse: 100 98 106   Temp: 98.9 F (37.2 C)  97.5 F (36.4 C)   TempSrc: Oral  Oral   Resp: 20  20   Height:      Weight:      SpO2: 95%  97%     Last BM Date: 06/13/15 (prior to surgery )  Intake/Output   Yesterday:  11/04 0701 - 11/05 0700 In: 6468 [P.O.:340; I.V.:1030; IV Piggyback:100] Out: 740 [Urine:675; Drains:65] This shift:  Total I/O In: -  Out: 145 [Urine:125; Drains:20]  Bowel function:  Flatus: y  BM: n  Drain: serosanguinous.  No bile  Physical Exam:  General: Pt awakens, oriented x4 in no acute distress.   Answers appropriate questions.  Asked appropriate questions. Eyes: PERRL, normal EOM.  Sclera clear.  No icterus Neuro: CN II-XII intact w/o focal sensory/motor deficits.  Normal hand grip bilaterally.  Has slurred speech though - sometimes difficult to follow Lymph: No head/neck/groin lymphadenopathy Psych:  No delerium/psychosis/paranoia HENT: Normocephalic, Mucus membranes moist.  No thrush Neck: Supple, No tracheal deviation Chest: No chest wall pain w good excursion CV:  Pulses intact.  Regular rhythm MS: Normal AROM mjr joints.  No obvious deformity Abdomen: Soft.  Nondistended.  On-Q pump partially collapsed.   Mildly tender at incisions only.  No evidence of peritonitis.  No incarcerated hernias. Ext:  SCDs BLE.  No mjr edema.  No cyanosis Skin: No petechiae / purpura  Results:   Labs: Results for orders placed or performed during the hospital encounter of 06/14/15 (from the past 48 hour(s))  Prepare RBC     Status: None   Collection Time: 06/16/15 11:29 AM  Result Value Ref Range   Order Confirmation ORDER PROCESSED BY BLOOD BANK   CBC     Status: Abnormal   Collection Time: 06/17/15  4:03 AM  Result Value Ref Range   WBC 13.0 (H) 4.0 - 10.5 K/uL   RBC 3.67 (L) 3.87 - 5.11 MIL/uL   Hemoglobin 10.5 (L) 12.0 - 15.0 g/dL    Comment: DELTA CHECK NOTED REPEATED TO VERIFY    HCT 31.2 (L) 36.0 - 46.0 %   MCV 85.0 78.0 - 100.0 fL  MCH 28.6 26.0 - 34.0 pg   MCHC 33.7 30.0 - 36.0 g/dL   RDW 13.8 11.5 - 15.5 %   Platelets 181 150 - 400 K/uL  Comprehensive metabolic panel     Status: Abnormal   Collection Time: 06/17/15  4:03 AM  Result Value Ref Range   Sodium 137 135 - 145 mmol/L   Potassium 4.2 3.5 - 5.1 mmol/L   Chloride 104 101 - 111 mmol/L   CO2 27 22 - 32 mmol/L   Glucose, Bld 130 (H) 65 - 99 mg/dL   BUN 9 6 - 20 mg/dL   Creatinine, Ser 0.84 0.44 - 1.00 mg/dL   Calcium 10.2 8.9 - 10.3 mg/dL   Total Protein 6.1 (L) 6.5 - 8.1 g/dL   Albumin 4.7 3.5 - 5.0 g/dL    AST 112 (H) 15 - 41 U/L   ALT 204 (H) 14 - 54 U/L   Alkaline Phosphatase 71 38 - 126 U/L   Total Bilirubin 1.9 (H) 0.3 - 1.2 mg/dL   GFR calc non Af Amer >60 >60 mL/min   GFR calc Af Amer >60 >60 mL/min    Comment: (NOTE) The eGFR has been calculated using the CKD EPI equation. This calculation has not been validated in all clinical situations. eGFR's persistently <60 mL/min signify possible Chronic Kidney Disease.    Anion gap 6 5 - 15  CBC     Status: Abnormal   Collection Time: 06/18/15  4:06 AM  Result Value Ref Range   WBC 17.9 (H) 4.0 - 10.5 K/uL   RBC 4.44 3.87 - 5.11 MIL/uL   Hemoglobin 12.6 12.0 - 15.0 g/dL   HCT 37.5 36.0 - 46.0 %   MCV 84.5 78.0 - 100.0 fL   MCH 28.4 26.0 - 34.0 pg   MCHC 33.6 30.0 - 36.0 g/dL   RDW 13.7 11.5 - 15.5 %   Platelets 258 150 - 400 K/uL  Comprehensive metabolic panel     Status: Abnormal   Collection Time: 06/18/15  4:06 AM  Result Value Ref Range   Sodium 134 (L) 135 - 145 mmol/L   Potassium 4.1 3.5 - 5.1 mmol/L   Chloride 100 (L) 101 - 111 mmol/L   CO2 25 22 - 32 mmol/L   Glucose, Bld 141 (H) 65 - 99 mg/dL   BUN 10 6 - 20 mg/dL   Creatinine, Ser 0.96 0.44 - 1.00 mg/dL   Calcium 10.7 (H) 8.9 - 10.3 mg/dL   Total Protein 6.6 6.5 - 8.1 g/dL   Albumin 4.3 3.5 - 5.0 g/dL   AST 56 (H) 15 - 41 U/L   ALT 156 (H) 14 - 54 U/L   Alkaline Phosphatase 97 38 - 126 U/L   Total Bilirubin 2.1 (H) 0.3 - 1.2 mg/dL   GFR calc non Af Amer 52 (L) >60 mL/min   GFR calc Af Amer 60 (L) >60 mL/min    Comment: (NOTE) The eGFR has been calculated using the CKD EPI equation. This calculation has not been validated in all clinical situations. eGFR's persistently <60 mL/min signify possible Chronic Kidney Disease.    Anion gap 9 5 - 15    Imaging / Studies: No results found.  Medications / Allergies: per chart  Antibiotics: Anti-infectives    Start     Dose/Rate Route Frequency Ordered Stop   06/14/15 2200  ceFAZolin (ANCEF) IVPB 2 g/50 mL  premix     2 g 100 mL/hr over 30 Minutes Intravenous 3   times per day 06/14/15 1844 06/15/15 0119   06/14/15 1036  ceFAZolin (ANCEF) IVPB 2 g/50 mL premix     2 g 100 mL/hr over 30 Minutes Intravenous On call to O.R. 06/14/15 1036 06/14/15 1335        Note: Portions of this report may have been transcribed using voice recognition software. Every effort was made to ensure accuracy; however, inadvertent computerized transcription errors may be present.   Any transcriptional errors that result from this process are unintentional.      C. , M.D., F.A.C.S. Gastrointestinal and Minimally Invasive Surgery [facility] [address] [place], [place] [address] [phone] Main / Paging   06/18/2015  CARE TEAM:  PCP: [name], [name], DO  Outpatient Care Team: Patient Care Team: [name] L [name], DO as PCP - General (Geriatric Medicine)  Inpatient Treatment Team: Treatment Team: Attending Provider: Faera Byerly, MD; Technician: Erica M Miller, NT; Registered Nurse: Jennifer M Dodson, RN; Technician: Taylor B Smith, NT; Technician: Kimberly A Ricks, NT; Registered Nurse: Briana D Richardson, RN  

## 2015-06-18 NOTE — Care Management Note (Addendum)
Case Management Note  Patient Details  Name: Destiny Velasquez MRN: [id number] Date of Birth: March 06, 1926  Subjective/Objective:     Liver cancer               Action/Plan: NCM spoke to pt and gave permission to speak to dtr, Geanie Kenning # [phone]. Pt states she lives at home but husband will be able to assist minimally at home with her care. States her dtr was planning to arrange for aide to come in a few hours per day to help with care. Pt may require HH at time of dc. Pt states best to speak to dtr to arrange [facility]. Plans to borrow a RW from neighbor. Pt is requesting a 3-n-1 bedside commode. Will speak to dtr to see if 3n1 and RW (if rolling walker with seat is preferred) is needed at dc. Waiting final recommendations for home.  Expected Discharge Date:                  Expected Discharge Plan:  [place]  In-House Referral:  NA  Discharge planning Services  CM Consult   Status of Service:  In process, will continue to follow  Medicare Important Message Given:  Yes-second notification given Date Medicare IM Given:    Medicare IM give by:    Date Additional Medicare IM Given:    Additional Medicare Important Message give by:     If discussed at [place] of Stay Meetings, dates discussed:    Additional Comments:  Erenest Rasher, RN 06/18/2015, 6:34 PM

## 2015-06-18 NOTE — Care Management Important Message (Signed)
Important Message  Patient Details  Name: Destiny Velasquez MRN: [id number] Date of Birth: August 06, 1926   Medicare Important Message Given:  Yes-second notification given    Erenest Rasher, RN 06/18/2015, 6:32 PM

## 2015-06-19 LAB — COMPREHENSIVE METABOLIC PANEL
ALBUMIN: 3.8 g/dL (ref 3.5–5.0)
ALT: 109 U/L — AB (ref 14–54)
AST: 44 U/L — AB (ref 15–41)
Alkaline Phosphatase: 94 U/L (ref 38–126)
Anion gap: 9 (ref 5–15)
BILIRUBIN TOTAL: 1.3 mg/dL — AB (ref 0.3–1.2)
BUN: 15 mg/dL (ref 6–20)
CHLORIDE: 100 mmol/L — AB (ref 101–111)
CO2: 26 mmol/L (ref 22–32)
Calcium: 10.5 mg/dL — ABNORMAL HIGH (ref 8.9–10.3)
Creatinine, Ser: 1.01 mg/dL — ABNORMAL HIGH (ref 0.44–1.00)
GFR calc Af Amer: 56 mL/min — ABNORMAL LOW (ref >60)
GFR calc non Af Amer: 49 mL/min — ABNORMAL LOW (ref >60)
GLUCOSE: 116 mg/dL — AB (ref 65–99)
POTASSIUM: 3.9 mmol/L (ref 3.5–5.1)
Sodium: 135 mmol/L (ref 135–145)
TOTAL PROTEIN: 6.2 g/dL — AB (ref 6.5–8.1)

## 2015-06-19 LAB — CBC
HEMATOCRIT: 36.8 % (ref 36.0–46.0)
HEMOGLOBIN: 12.3 g/dL (ref 12.0–15.0)
MCH: 28.8 pg (ref 26.0–34.0)
MCHC: 33.4 g/dL (ref 30.0–36.0)
MCV: 86.2 fL (ref 78.0–100.0)
Platelets: 282 10*3/uL (ref 150–400)
RBC: 4.27 MIL/uL (ref 3.87–5.11)
RDW: 13.8 % (ref 11.5–15.5)
WBC: 15.2 10*3/uL — AB (ref 4.0–10.5)

## 2015-06-19 LAB — GLUCOSE, CAPILLARY: Glucose-Capillary: 108 mg/dL — ABNORMAL HIGH (ref 65–99)

## 2015-06-19 LAB — AMMONIA: Ammonia: 20 umol/L (ref 9–35)

## 2015-06-19 MED ORDER — SPIRONOLACTONE 25 MG PO TABS: 12.5000 mg | ORAL_TABLET | Freq: Every day | ORAL | Status: DC

## 2015-06-19 MED ORDER — POLYETHYLENE GLYCOL 3350 17 G PO PACK
17.0000 g | PACK | Freq: Two times a day (BID) | ORAL | Status: DC
  Administered 2015-06-19 – 2015-06-23 (×6): 17 g via ORAL
  Filled 2015-06-19 (×8): qty 1

## 2015-06-19 MED ORDER — VITAMIN D 1000 UNITS PO TABS
2000.0000 [IU] | ORAL_TABLET | Freq: Every day | ORAL | Status: DC
  Administered 2015-06-19 – 2015-06-21 (×2): 2000 [IU] via ORAL
  Filled 2015-06-19 (×4): qty 2

## 2015-06-19 MED ORDER — PSYLLIUM 95 % PO PACK: 1.0000 | PACK | Freq: Two times a day (BID) | ORAL | Status: DC

## 2015-06-19 MED ORDER — HYDROCHLOROTHIAZIDE 12.5 MG PO CAPS: 12.5000 mg | ORAL_CAPSULE | Freq: Every day | ORAL | Status: DC

## 2015-06-19 MED ORDER — SPIRONOLACTONE-HCTZ 25-25 MG PO TABS: 0.5000 | ORAL_TABLET | Freq: Every day | ORAL | Status: DC

## 2015-06-19 MED ORDER — ONDANSETRON HCL 4 MG PO TABS: 4.0000 mg | ORAL_TABLET | Freq: Three times a day (TID) | ORAL | Status: DC | PRN

## 2015-06-19 MED ORDER — ENSURE ENLIVE PO LIQD
237.0000 mL | Freq: Two times a day (BID) | ORAL | Status: DC
  Administered 2015-06-20: 237 mL via ORAL

## 2015-06-19 NOTE — Progress Notes (Signed)
[facility]  [address] [place], [place] [address] Phone: [phone] FAX: [phone]   Destiny Velasquez [id number] 10/22/1925   Problem List:   Principal Problem:   Adenocarcinoma of liver s/p partial right hepatic lobectomy 06/14/2015 Active Problems:   Chronic constipation   Hypothyroidism   Essential hypertension   Subacute delirium   GERD (gastroesophageal reflux disease)   5 Days Post-Op  06/14/2015  PREOPERATIVE DIAGNOSIS: Adenocarcinoma of the liver, presumed primary.  POSTOPERATIVE DIAGNOSIS: Same  PROCEDURE PERFORMED: Diagnostic laparoscopy, partial right hepatic lobectomy  SURGEON: [name], MD  ASSISTANT: Jackolyn Confer, MD  Assessment  MS much better  Ileus resolving  Plan:  -follow MS.  Much improved.  Check ammonia Switch meds.  No evidence of hypoxia.  No anemia.  Mild transaminitis but no evidence of liver failure.  No obstructive kidney failure.  Blood pressure fine.  If worsens, may need more aggressive evaluation.  No focal deficits though.  -adv diet to solids  -HTN control  -anxiolysis  -VTE prophylaxis- SCDs, etc  -mobilize as tolerated to help recovery  disposition - PT/OT evals.  CSW  Involved.  Prob will benefit from [facility].  Daughter & pt concerned about d/c plans.  I noted her MS is MUCH better so we will have everyone eval & follow for final recs.  PT,OT,RN,MD, CM/CSW, etc  I updated the status of the patient to the patient and the patient's family.  I made recommendations.  I answered questions.  Understanding & appreciation was MOSTLY expressed.   Adin Hector, M.D., F.A.C.S. Gastrointestinal and Minimally Invasive Surgery [facility] [address], [place], [place] [address] [phone] Main / Paging   06/19/2015  Subjective:  Much more alert Walking to walk - "Why won't they let me get out of bed?" Daughter in room Trying full  liquids   Objective:  Vital signs:  Filed Vitals:   06/18/15 0645 06/18/15 1329 06/18/15 2100 06/18/15 2150  BP: 158/76 123/63  139/64  Pulse:  105 94 94  Temp:  97.4 F (36.3 C)  98 F (36.7 C)  TempSrc:  Oral  Oral  Resp:  18  18  Height:      Weight:      SpO2:  97%  97%    Last BM Date: 06/13/15  Intake/Output   Yesterday:  11/05 0701 - 11/06 0700 In: 470 [P.O.:300; I.V.:170] Out: 350 [Urine:285; Drains:65] This shift:     Bowel function:  Flatus: y  BM: n  Drain: serosanguinous with old dark blood.  No bile  Physical Exam:  General: Pt awake, alert, oriented x4 in no acute distress.  Answers appropriate questions.  Asked appropriate questions. Eyes: PERRL, normal EOM.  Sclera clear.  No icterus Neuro: CN II-XII intact w/o focal sensory/motor deficits.  Normal hand grip bilaterally.  No slurred speech though - sometimes difficult to follow Lymph: No head/neck/groin lymphadenopathy Psych:  No delerium/psychosis/paranoia HENT: Normocephalic, Mucus membranes moist.  No thrush Neck: Supple, No tracheal deviation Chest: No chest wall pain w good excursion CV:  Pulses intact.  Regular rhythm MS: Normal AROM mjr joints.  No obvious deformity Abdomen: Soft.  Nondistended.  On-Q clogged - I removed pump.   Mildly tender at incisions only.  No evidence of peritonitis.  No incarcerated hernias. Ext:  SCDs BLE.  No mjr edema.  No cyanosis Skin: No petechiae / purpura  Results:   Labs: Results for orders placed or  performed during the hospital encounter of 06/14/15 (from the past 48 hour(s))  CBC     Status: Abnormal   Collection Time: 06/18/15  4:06 AM  Result Value Ref Range   WBC 17.9 (H) 4.0 - 10.5 K/uL   RBC 4.44 3.87 - 5.11 MIL/uL   Hemoglobin 12.6 12.0 - 15.0 g/dL   HCT 37.5 36.0 - 46.0 %   MCV 84.5 78.0 - 100.0 fL   MCH 28.4 26.0 - 34.0 pg   MCHC 33.6 30.0 - 36.0 g/dL   RDW 13.7 11.5 - 15.5 %   Platelets 258 150 - 400 K/uL  Comprehensive metabolic  panel     Status: Abnormal   Collection Time: 06/18/15  4:06 AM  Result Value Ref Range   Sodium 134 (L) 135 - 145 mmol/L   Potassium 4.1 3.5 - 5.1 mmol/L   Chloride 100 (L) 101 - 111 mmol/L   CO2 25 22 - 32 mmol/L   Glucose, Bld 141 (H) 65 - 99 mg/dL   BUN 10 6 - 20 mg/dL   Creatinine, Ser 0.96 0.44 - 1.00 mg/dL   Calcium 10.7 (H) 8.9 - 10.3 mg/dL   Total Protein 6.6 6.5 - 8.1 g/dL   Albumin 4.3 3.5 - 5.0 g/dL   AST 56 (H) 15 - 41 U/L   ALT 156 (H) 14 - 54 U/L   Alkaline Phosphatase 97 38 - 126 U/L   Total Bilirubin 2.1 (H) 0.3 - 1.2 mg/dL   GFR calc non Af Amer 52 (L) >60 mL/min   GFR calc Af Amer 60 (L) >60 mL/min    Comment: (NOTE) The eGFR has been calculated using the CKD EPI equation. This calculation has not been validated in all clinical situations. eGFR's persistently <60 mL/min signify possible Chronic Kidney Disease.    Anion gap 9 5 - 15  Lipase, blood     Status: None   Collection Time: 06/18/15 11:26 AM  Result Value Ref Range   Lipase 27 11 - 51 U/L  Ammonia     Status: None   Collection Time: 06/18/15 11:26 AM  Result Value Ref Range   Ammonia 19 9 - 35 umol/L  CBC     Status: Abnormal   Collection Time: 06/19/15  4:10 AM  Result Value Ref Range   WBC 15.2 (H) 4.0 - 10.5 K/uL   RBC 4.27 3.87 - 5.11 MIL/uL   Hemoglobin 12.3 12.0 - 15.0 g/dL   HCT 36.8 36.0 - 46.0 %   MCV 86.2 78.0 - 100.0 fL   MCH 28.8 26.0 - 34.0 pg   MCHC 33.4 30.0 - 36.0 g/dL   RDW 13.8 11.5 - 15.5 %   Platelets 282 150 - 400 K/uL  Comprehensive metabolic panel     Status: Abnormal   Collection Time: 06/19/15  4:10 AM  Result Value Ref Range   Sodium 135 135 - 145 mmol/L   Potassium 3.9 3.5 - 5.1 mmol/L   Chloride 100 (L) 101 - 111 mmol/L   CO2 26 22 - 32 mmol/L   Glucose, Bld 116 (H) 65 - 99 mg/dL   BUN 15 6 - 20 mg/dL   Creatinine, Ser 1.01 (H) 0.44 - 1.00 mg/dL   Calcium 10.5 (H) 8.9 - 10.3 mg/dL   Total Protein 6.2 (L) 6.5 - 8.1 g/dL   Albumin 3.8 3.5 - 5.0 g/dL   AST  44 (H) 15 - 41 U/L   ALT 109 (H) 14 - 54 U/L  Alkaline Phosphatase 94 38 - 126 U/L   Total Bilirubin 1.3 (H) 0.3 - 1.2 mg/dL   GFR calc non Af Amer 49 (L) >60 mL/min   GFR calc Af Amer 56 (L) >60 mL/min    Comment: (NOTE) The eGFR has been calculated using the CKD EPI equation. This calculation has not been validated in all clinical situations. eGFR's persistently <60 mL/min signify possible Chronic Kidney Disease.    Anion gap 9 5 - 15  Ammonia     Status: None   Collection Time: 06/19/15  4:10 AM  Result Value Ref Range   Ammonia 20 9 - 35 umol/L    Imaging / Studies: No results found.  Medications / Allergies: per chart  Antibiotics: Anti-infectives    Start     Dose/Rate Route Frequency Ordered Stop   06/14/15 2200  ceFAZolin (ANCEF) IVPB 2 g/50 mL premix     2 g 100 mL/hr over 30 Minutes Intravenous 3 times per day 06/14/15 1844 06/15/15 0119   06/14/15 1036  ceFAZolin (ANCEF) IVPB 2 g/50 mL premix     2 g 100 mL/hr over 30 Minutes Intravenous On call to O.R. 06/14/15 1036 06/14/15 1335        Note: Portions of this report may have been transcribed using voice recognition software. Every effort was made to ensure accuracy; however, inadvertent computerized transcription errors may be present.   Any transcriptional errors that result from this process are unintentional.     Adin Hector, M.D., F.A.C.S. Gastrointestinal and Minimally Invasive Surgery [facility] [address] [place], [place] [address] [phone] Main / Paging   06/19/2015  CARE TEAM:  PCP: Hollace Kinnier, DO  Outpatient Care Team: Patient Care Team: Gayland Curry, DO as PCP - General (Geriatric Medicine)  Inpatient Treatment Team: Treatment Team: Attending Provider: [name], MD; Technician: Toribio Harbour, NT; Registered Nurse: Marcelle Smiling, RN; Technician: Marletta Lor, NT; Registered Nurse: Celedonio Savage, RN; Registered  Nurse: Benjamine Mola, RN; Technician: Coralie Carpen, NT; Physical Therapist: Neil Crouch, PT

## 2015-06-19 NOTE — Care Management Note (Signed)
Case Management Note  Patient Details  Name: Destiny Velasquez MRN: [id number] Date of Birth: 15-Oct-1925  Subjective/Objective:                  Liver Cancer  Action/Plan: CM spoke with the patient and her daughter at the bedside to follow-up on DME needs. Daughter and patient state he she has a RW from a neighbor and will not need another one. Daughter states she may need a 3N1. States she is not sure if the patient will need to go to a SNF short-term for rehab at discharge or whether she will be able to return home with home care. She lives with her 47 year old husband and has a Soil scientist with CNA's thru [facility] to provide assistance. Daughter has checked into rehab at [facility] since it is close to their home. Will continue to follow for discharge needs.   Expected Discharge Date:                  Expected Discharge Plan:  [place]  In-House Referral:  NA  Discharge planning Services  CM Consult  Post Acute Care Choice:    Choice offered to:     DME Arranged:    DME Agency:     HH Arranged:    HH Agency:     Status of Service:  In process, will continue to follow  Medicare Important Message Given:  Yes-second notification given Date Medicare IM Given:    Medicare IM give by:    Date Additional Medicare IM Given:    Additional Medicare Important Message give by:     If discussed at [place] of Stay Meetings, dates discussed:    Additional Comments:  Apolonio Schneiders, RN 06/19/2015, 10:29 AM

## 2015-06-19 NOTE — Evaluation (Signed)
Physical Therapy Evaluation Patient Details Name: Destiny Velasquez MRN: [id number] DOB: January 14, 1926 Today's Date: 06/19/2015   History of Present Illness  81 yo female adm 06/14/15 with liver mass, s/p Diagnostic laparoscopy, partial right hepatic lobectomy PMHx:  right mastectomy, HTN  Clinical Impression  Pt admitted with above diagnosis. Pt currently with functional limitations due to the deficits listed below (see PT Problem List). Pt will benefit from skilled PT to increase their independence and safety with mobility to allow discharge to the venue listed below.  Recommend HHPT at this time, pt's husband is 95 but fairly I per her report, dtr may be able to assist more if needed (?) dtr not present for eval;     Follow Up Recommendations Home health PT;Supervision - Intermittent    Equipment Recommendations  Rolling walker with 5" wheels    Recommendations for Other Services       Precautions / Restrictions Precautions Precautions: Fall      Mobility  Bed Mobility Overal bed mobility: Needs Assistance Bed Mobility: Supine to Sit;Sit to Supine     Supine to sit: Min guard Sit to supine: Min guard   General bed mobility comments: HOB elevated `60*, pt does partial roll and uses rail; min/guard for trunk to come to full sit and close guarding when lowering to supine  Transfers Overall transfer level: Needs assistance Equipment used: Rolling walker (2 wheeled) Transfers: Sit to/from [facility] Sit to Stand: Min assist Stand pivot transfers: Min assist       General transfer comment: assist to rise and stabilize once in full standing; verbal cues for hand placement and safety  Ambulation/Gait Ambulation/Gait assistance: Min assist Ambulation Distance (Feet): 110 Feet Assistive device: Rolling walker (2 wheeled) Gait Pattern/deviations: Step-through pattern;Decreased stride length;Trunk flexed     General Gait Details: verbal cues for correct posture, RW  position form self and safety with turns ( to stay inside RW)  Stairs            Wheelchair Mobility    Modified Rankin (Stroke Patients Only)       Balance Overall balance assessment: Needs assistance   Sitting balance-Leahy Scale: Fair (at least fair, NT further)       Standing balance-Leahy Scale: Poor Standing balance comment: unsteady without UE support                             Pertinent Vitals/Pain Pain Assessment: No/denies pain    Home Living Family/patient expects to be discharged to:: Private residence Living Arrangements: Spouse/significant other   Type of Home: House Home Access: Stairs to enter   Technical brewer of Steps: 2-3 Home Layout: One level Home Equipment: Cane - single point Additional Comments: can borrow BSC, RW    Prior Function Level of Independence: Independent               Hand Dominance        Extremity/Trunk Assessment   Upper Extremity Assessment: Defer to OT evaluation           Lower Extremity Assessment: Generalized weakness         Communication   Communication: No difficulties  Cognition Arousal/Alertness: Awake/alert Behavior During Therapy: WFL for tasks assessed/performed Overall Cognitive Status: Within Functional Limits for tasks assessed                      General Comments      Exercises  Assessment/Plan    PT Assessment Patient needs continued PT services  PT Diagnosis Difficulty walking   PT Problem List Decreased strength;Decreased activity tolerance;Decreased balance;Decreased mobility;Decreased knowledge of use of DME  PT Treatment Interventions DME instruction;Gait training;Functional mobility training;Therapeutic activities;Therapeutic exercise;Patient/family education   PT Goals (Current goals can be found in the Care Plan section) Acute Rehab PT Goals Patient Stated Goal: to get stronger PT Goal Formulation: With patient Time For Goal  Achievement: 06/24/15 Potential to Achieve Goals: Good    Frequency Min 3X/week   Barriers to discharge        Co-evaluation               End of Session Equipment Utilized During Treatment: Gait belt Activity Tolerance: Patient tolerated treatment well Patient left: in bed;with call bell/phone within reach;with bed alarm set Nurse Communication: Mobility status         Time: 3419-6222 PT Time Calculation (min) (ACUTE ONLY): 22 min   Charges:   PT Evaluation $Initial PT Evaluation Tier I: 1 Procedure     PT G CodesKenyon Ana 06-27-15, 4:27 PM

## 2015-06-20 MED ORDER — SIMETHICONE 80 MG PO CHEW
80.0000 mg | CHEWABLE_TABLET | Freq: Four times a day (QID) | ORAL | Status: DC | PRN
  Administered 2015-06-20 – 2015-06-22 (×3): 80 mg via ORAL
  Filled 2015-06-20 (×3): qty 1

## 2015-06-20 NOTE — Evaluation (Signed)
Occupational Therapy Evaluation Patient Details Name: Destiny Velasquez MRN: [id number] DOB: 1926-07-25 Today's Date: 06/20/2015    History of Present Illness 81 yo female adm 06/14/15 with liver mass, s/p Diagnostic laparoscopy, partial right hepatic lobectomy PMHx:  right mastectomy, HTN   Clinical Impression   Pt admitted with liver mass. Pt currently with functional limitations due to the deficits listed below (see OT Problem List).  Pt will benefit from skilled OT to increase their safety and independence with ADL and functional mobility for ADL to facilitate discharge to venue listed below.      Follow Up Recommendations  SNF    Equipment Recommendations  None recommended by OT       Precautions / Restrictions Precautions Precautions: Fall      Mobility Bed Mobility Overal bed mobility: Needs Assistance Bed Mobility: Supine to Sit;Sit to Supine     Supine to sit: Min guard Sit to supine: Min guard      Transfers Overall transfer level: Needs assistance Equipment used: Rolling walker (2 wheeled)   Sit to Stand: Min assist Stand pivot transfers: Min assist       General transfer comment: assist to rise and stabilize once in full standing; verbal cues for hand placement and safety         ADL Overall ADL's : Needs assistance/impaired Eating/Feeding: Set up;Sitting   Grooming: Set up;Sitting   Upper Body Bathing: Set up;Sitting   Lower Body Bathing: Moderate assistance;Sit to/from stand   Upper Body Dressing : Set up;Sitting   Lower Body Dressing: Moderate assistance;Sit to/from stand   Toilet Transfer: Moderate assistance;BSC   Toileting- Clothing Manipulation and Hygiene: Maximal assistance;Sit to/from stand Toileting - Clothing Manipulation Details (indicate cue type and reason): changing pad       General ADL Comments: pt recognizes need fo rehab as she states husband not able to help with most things               Pertinent Vitals/Pain  Pain Assessment: No/denies pain     Hand Dominance     Extremity/Trunk Assessment Upper Extremity Assessment Upper Extremity Assessment: Generalized weakness           Communication Communication Communication: No difficulties   Cognition Arousal/Alertness: Awake/alert Behavior During Therapy: WFL for tasks assessed/performed Overall Cognitive Status: Within Functional Limits for tasks assessed                                Home Living Family/patient expects to be discharged to:: Private residence Living Arrangements: Spouse/significant other   Type of Home: House Home Access: Stairs to enter Technical brewer of Steps: 2-3   Home Layout: One level     Bathroom Shower/Tub: Tub/shower unit Shower/tub characteristics: Door       Home Equipment: Radio producer - single point   Additional Comments: can borrow BSC, RW      Prior Functioning/Environment Level of Independence: Independent             OT Diagnosis: Generalized weakness   OT Problem List: Decreased strength;Decreased activity tolerance   OT Treatment/Interventions: Self-care/ADL training;DME and/or AE instruction    OT Goals(Current goals can be found in the care plan section) Acute Rehab OT Goals Patient Stated Goal: to get stronger OT Goal Formulation: With patient Time For Goal Achievement: 06/27/15 Potential to Achieve Goals: Good  OT Frequency: Min 2X/week    End of Session Nurse Communication: Mobility status  Activity  Tolerance: Patient tolerated treatment well Patient left: in chair;with call bell/phone within reach   Time: 1230-1245 OT Time Calculation (min): 15 min Charges:  OT General Charges $OT Visit: 1 Procedure OT Evaluation $Initial OT Evaluation Tier I: 1 Procedure G-Codes:    Betsy Pries 07/18/2015, 1:24 PM

## 2015-06-20 NOTE — Clinical Social Work Placement (Signed)
   CLINICAL SOCIAL WORK PLACEMENT  NOTE  Date:  06/20/2015  Patient Details  Name: Destiny Velasquez MRN: [id number] Date of Birth: [birth year]  Clinical Social Work is seeking post-discharge placement for this patient at the [place] level of care (*CSW will initial, date and re-position this form in  chart as items are completed):  Yes   Patient/family provided with [facility] Department's list of facilities offering this level of care within the geographic area requested by the patient (or if unable, by the patient's family).      Patient/family informed of their freedom to choose among providers that offer the needed level of care, that participate in Medicare, Medicaid or managed care program needed by the patient, have an available bed and are willing to accept the patient.  Yes   Patient/family informed of [facility] ownership interest in [facility] and [facility], as well as of the fact that they are under no obligation to receive care at these facilities.  PASRR submitted to EDS on 06/20/15     PASRR number received on 06/20/15     Existing PASRR number confirmed on       FL2 transmitted to all facilities in geographic area requested by pt/family on 06/20/15     FL2 transmitted to all facilities within larger geographic area on       Patient informed that his/her managed care company has contracts with or will negotiate with certain facilities, including the following:            Patient/family informed of bed offers received.  Patient chooses bed at       Physician recommends and patient chooses bed at      Patient to be transferred to   on  .  Patient to be transferred to facility by       Patient family notified on   of transfer.  Name of family member notified:        PHYSICIAN Please sign FL2     Additional Comment:    _______________________________________________ Ladell Pier, LCSW 06/20/2015, 4:02  PM

## 2015-06-20 NOTE — Progress Notes (Signed)
Patient ID: Destiny Velasquez, female   DOB: 11-02-1925, 81 y.o.   MRN: [id number]  [address] [place], [place] [address] Phone: [phone] FAX: [phone]   Destiny Velasquez [id number] 1925-09-11   Problem List:   Principal Problem:   Adenocarcinoma of liver s/p partial right hepatic lobectomy 06/14/2015 Active Problems:   Chronic constipation   Hypothyroidism   Essential hypertension   Subacute delirium   GERD (gastroesophageal reflux disease)   6 Days Post-Op  06/14/2015  PREOPERATIVE DIAGNOSIS: Adenocarcinoma of the liver, presumed primary.  POSTOPERATIVE DIAGNOSIS: Same  PROCEDURE PERFORMED: Diagnostic laparoscopy, partial right hepatic lobectomy  SURGEON: [name], MD  ASSISTANT: Jackolyn Confer, MD  Assessment  Improving ileus.    Plan:  mobilize  -regular diet.    -HTN control  -anxiolysis  -VTE prophylaxis- SCDs, etc  -mobilize as tolerated to help recovery  disposition - PT/OT evals.  CSW  Involved.  Prob will benefit from [facility] ([facility]).  Daughter & pt concerned about d/c plans.  May need short term SNF to bridge to home.     [facility] [phone] [address], [place], [place] [address] [phone] Main / Paging   06/20/2015  Subjective:  + flatus, no BM yet.  Tolerating clears, but having gas pain.    Objective:  Vital signs:  Filed Vitals:   06/18/15 2150 06/19/15 1405 06/19/15 2028 06/20/15 0523  BP: 139/64 133/67 144/69 137/57  Pulse: 94 94 95 88  Temp: 98 F (36.7 C) 98.4 F (36.9 C) 97.8 F (36.6 C) 97.7 F (36.5 C)  TempSrc: Oral Oral Oral Oral  Resp: _0 Height:      Weight:      SpO2: 97% 98% 97% 96%    Last BM Date: 06/13/15  Intake/Output   Yesterday:  11/06 0701 - 11/07 0700 In: 17 [P.O.:960] Out: 147.5 [Urine:100; Drains:47.5] This shift:  Total I/O In: 240 [P.O.:240] Out: -   Bowel function:  Flatus: y  BM:  n  Drain: serosanguinous with old dark blood.  No bile  Physical Exam:  General: Pt awake, alert, oriented x4 in no acute distress.  Answers appropriate questions.  Asked appropriate questions. Eyes: PERRL, normal EOM.  Sclera clear.  No icterus Neuro: CN II-XII intact w/o focal sensory/motor deficits.  Normal hand grip bilaterally.  No slurred speech though - sometimes difficult to follow Lymph: No head/neck/groin lymphadenopathy Psych:  No delerium/psychosis/paranoia HENT: Normocephalic, Mucus membranes moist.  No thrush Neck: Supple, No tracheal deviation Chest: No chest wall pain w good excursion CV:  Pulses intact.  Regular rhythm MS: Normal AROM mjr joints.  No obvious deformity Abdomen: Soft.  Nondistended.  On-Q clogged - I removed pump.   Mildly tender at incisions only.  No evidence of peritonitis.  No incarcerated hernias. Ext:  SCDs BLE.  No mjr edema.  No cyanosis Skin: No petechiae / purpura  Results:   Labs: Results for orders placed or performed during the hospital encounter of 06/14/15 (from the past 48 hour(s))  CBC     Status: Abnormal   Collection Time: 06/19/15  4:10 AM  Result Value Ref Range   WBC 15.2 (H) 4.0 - 10.5 K/uL   RBC 4.27 3.87 - 5.11 MIL/uL   Hemoglobin 12.3 12.0 - 15.0 g/dL   HCT 36.8 36.0 - 46.0 %   MCV 86.2 78.0 - 100.0 fL   MCH 28.8 26.0 - 34.0 pg  MCHC 33.4 30.0 - 36.0 g/dL   RDW 13.8 11.5 - 15.5 %   Platelets 282 150 - 400 K/uL  Comprehensive metabolic panel     Status: Abnormal   Collection Time: 06/19/15  4:10 AM  Result Value Ref Range   Sodium 135 135 - 145 mmol/L   Potassium 3.9 3.5 - 5.1 mmol/L   Chloride 100 (L) 101 - 111 mmol/L   CO2 26 22 - 32 mmol/L   Glucose, Bld 116 (H) 65 - 99 mg/dL   BUN 15 6 - 20 mg/dL   Creatinine, Ser 1.01 (H) 0.44 - 1.00 mg/dL   Calcium 10.5 (H) 8.9 - 10.3 mg/dL   Total Protein 6.2 (L) 6.5 - 8.1 g/dL   Albumin 3.8 3.5 - 5.0 g/dL   AST 44 (H) 15 - 41 U/L   ALT 109 (H) 14 - 54 U/L   Alkaline  Phosphatase 94 38 - 126 U/L   Total Bilirubin 1.3 (H) 0.3 - 1.2 mg/dL   GFR calc non Af Amer 49 (L) >60 mL/min   GFR calc Af Amer 56 (L) >60 mL/min    Comment: (NOTE) The eGFR has been calculated using the CKD EPI equation. This calculation has not been validated in all clinical situations. eGFR's persistently <60 mL/min signify possible Chronic Kidney Disease.    Anion gap 9 5 - 15  Ammonia     Status: None   Collection Time: 06/19/15  4:10 AM  Result Value Ref Range   Ammonia 20 9 - 35 umol/L  Glucose, capillary     Status: Abnormal   Collection Time: 06/19/15  7:39 AM  Result Value Ref Range   Glucose-Capillary 108 (H) 65 - 99 mg/dL   Comment 1 Notify RN    Comment 2 Document in Chart     Imaging / Studies: No results found.  Medications / Allergies: per chart  Antibiotics: Anti-infectives    Start     Dose/Rate Route Frequency Ordered Stop   06/14/15 2200  ceFAZolin (ANCEF) IVPB 2 g/50 mL premix     2 g 100 mL/hr over 30 Minutes Intravenous 3 times per day 06/14/15 1844 06/15/15 0119   06/14/15 1036  ceFAZolin (ANCEF) IVPB 2 g/50 mL premix     2 g 100 mL/hr over 30 Minutes Intravenous On call to O.R. 06/14/15 1036 06/14/15 1335        Note: Portions of this report may have been transcribed using voice recognition software. Every effort was made to ensure accuracy; however, inadvertent computerized transcription errors may be present.   Any transcriptional errors that result from this process are unintentional.     Adin Hector, M.D., F.A.C.S. Gastrointestinal and Minimally Invasive Surgery [facility] [address], [place] [address] [phone] Main / Paging   06/20/2015  CARE TEAM:  PCP: Hollace Kinnier, DO  Outpatient Care Team: Patient Care Team: Gayland Curry, DO as PCP - General (Geriatric Medicine)  Inpatient Treatment Team: Treatment Team: Attending Provider: [name], MD; Technician: Toribio Harbour, NT; Registered Nurse: Marcelle Smiling, RN; Technician: Marletta Lor, NT; Registered Nurse: Celedonio Savage, RN; Registered Nurse: Benjamine Mola, RN; Technician: Suzanna Obey, NT; Occupational Therapist: Betsy Pries, OT; Technician: Ileene Rubens, NT; Registered Nurse: Carilyn Goodpasture, RN; Registered Nurse: Lottie Dawson, RN

## 2015-06-20 NOTE — Progress Notes (Signed)
Physical Therapy Treatment Patient Details Name: Destiny Velasquez MRN: [id number] DOB: [birth year] Today's Date: 06/20/2015    History of Present Illness 81 yo female adm 06/14/15 with liver mass, s/p Diagnostic laparoscopy, partial right hepatic lobectomy PMHx:  right mastectomy, HTN    PT Comments    Assisted pt OOB to amb to BR.  Assisted with hygiene.  Amb in hallway with increased time and VC's safety with walker.    Follow Up Recommendations  Home health PT;Supervision - Intermittent  (OT rec SNF and dghtr concerned about D/C safety.  Will consult PLT poss need for ST Re[facility] at [facility])     Equipment Recommendations  Rolling walker with 5" wheels    Recommendations for Other Services       Precautions / Restrictions Precautions Precautions: Fall Precaution Comments: R LQ drain Restrictions Weight Bearing Restrictions: No    Mobility  Bed Mobility Overal bed mobility: Needs Assistance Bed Mobility: Supine to Sit;Sit to Supine     Supine to sit: Supervision;Min guard Sit to supine: Supervision;Min guard   General bed mobility comments: increased time and use of rail  Transfers Overall transfer level: Needs assistance Equipment used: Rolling walker (2 wheeled) Transfers: Sit to/from Stand Sit to Stand: Min guard;Min assist Stand pivot transfers: Min assist       General transfer comment: assist to rise and stabilize once in full standing; verbal cues for hand placement and safety with turn completion  Ambulation/Gait Ambulation/Gait assistance: Min guard;Min assist Ambulation Distance (Feet): 120 Feet Assistive device: Rolling walker (2 wheeled) Gait Pattern/deviations: Step-to pattern;Step-through pattern Gait velocity: decreased   General Gait Details: verbal cues for correct posture, RW position form self and safety with turns ( to stay inside RW)   Stairs            Wheelchair Mobility    Modified Rankin (Stroke Patients Only)       Balance                                     Cognition Arousal/Alertness: Awake/alert Behavior During Therapy: WFL for tasks assessed/performed Overall Cognitive Status: Within Functional Limits for tasks assessed                      Exercises      General Comments        Pertinent Vitals/Pain Pain Assessment: Faces Faces Pain Scale: Hurts a little bit Pain Location: ABD Pain Descriptors / Indicators: Discomfort;Sore;Tender Pain Intervention(s): Monitored during session;Repositioned    Home Living Family/patient expects to be discharged to:: Private residence Living Arrangements: Spouse/significant other   Type of Home: House Home Access: Stairs to enter   Home Layout: One level Home Equipment: Radio producer - single point Additional Comments: can borrow BSC, RW    Prior Function Level of Independence: Independent          PT Goals (current goals can now be found in the care plan section) Acute Rehab PT Goals Patient Stated Goal: to get stronger Progress towards PT goals: Progressing toward goals    Frequency  Min 3X/week    PT Plan      Co-evaluation             End of Session Equipment Utilized During Treatment: Gait belt Activity Tolerance: Patient tolerated treatment well Patient left: in bed;with call bell/phone within reach;with bed alarm set     Time: [phone]  PT Time Calculation (min) (ACUTE ONLY): 25 min  Charges:  $Gait Training: 8-22 mins $Therapeutic Activity: 8-22 mins                    G Codes:      Rica Koyanagi  PTA [facility]  Acute  Rehab Pager      [phone]

## 2015-06-20 NOTE — Progress Notes (Signed)
CSW following for potential rehab at [facility] placement.  Pt, pt husband, and pt daughter are agreeable to explore rehab at SNF as a bridge to home. Pt and pt family agreeable to [facility] search. Pt and pt family have not made final decision surrounding discharge plan, but at this time, pt daughter, [name] is leaning toward plan for rehab.   CSW completed FL2 and initiated SNF search to [facility].   CSW to follow up with pt and pt family tomorrow regarding SNF bed offers and decision for discharge plan.   Full psychosocial assessment to follow.  Alison Murray, MSW, [place] [facility] [phone]

## 2015-06-20 NOTE — NC FL2 (Signed)
[place] LEVEL OF CARE SCREENING TOOL     IDENTIFICATION  Patient Name: Destiny Velasquez Birthdate: [birth year] Sex: female Admission Date (Current Location): 06/14/2015  [facility] and [place] Number: Herbalist and Address:  [facility],  [place] [address] [place]      Provider Number: [id number]  Attending Physician Name and Address:  [name], MD  Relative Name and Phone Number:       Current Level of Care: Hospital Recommended Level of Care: [place] Prior Approval Number:    Date Approved/Denied:   PASRR Number: [id number]  Discharge Plan: SNF    Current Diagnoses: Patient Active Problem List   Diagnosis Date Noted  . Subacute delirium   . GERD (gastroesophageal reflux disease)   . Adenocarcinoma of liver s/p partial right hepatic lobectomy 06/14/2015 06/14/2015  . At risk for colon cancer 05/17/2015  . Unintentional weight loss 05/06/2015  . Hypothyroidism 05/03/2015  . Essential hypertension 05/03/2015  . Chronic constipation 04/15/2014  . Arthralgia of right knee 01/26/2013  . Arthralgia of right lower leg 01/26/2013  . Hyperlipidemia   . Thyroid disease   . Memory loss   . Disorder of bone and cartilage, unspecified     Orientation ACTIVITIES/SOCIAL BLADDER RESPIRATION    Self, Time, Situation, Place  Active Incontinent Normal  BEHAVIORAL SYMPTOMS/MOOD NEUROLOGICAL BOWEL NUTRITION STATUS      Continent Diet (Regular Diet)  PHYSICIAN VISITS COMMUNICATION OF NEEDS Height & Weight Skin    Verbally '5\' 3"'$  (160 cm) 144 lbs. Surgical wounds (Incision (closed) 06/14/2015 Abdomen treatment Abdominal Pad; Gauze dressing changes daily)          AMBULATORY STATUS RESPIRATION    Supervision limited (+1 assist with ambulations and transfers) Normal      Personal Care Assistance Level of Assistance  Bathing, Dressing, Feeding Bathing Assistance: Limited assistance Feeding assistance:  Independent Dressing Assistance: Maximum assistance      Functional Limitations Info                [place]  PT (By licensed PT), OT (By licensed OT)     PT Frequency: 5x week OT Frequency: 5 x week           Additional Factors Info  Code Status, Allergies Code Status Info: FULL code status Allergies Info: No Known Allergies           Current Medications (06/20/2015): Current Facility-Administered Medications  Medication Dose Route Frequency Provider Last Rate Last Dose  . 0.9 %  sodium chloride infusion  250 mL Intravenous PRN Michael Boston, MD      . alum & mag hydroxide-simeth (MAALOX/MYLANTA) 200-200-20 MG/5ML suspension 30 mL  30 mL Oral Q6H PRN Michael Boston, MD   30 mL at 06/20/15 1241  . aspirin chewable tablet 81 mg  81 mg Oral Daily Michael Boston, MD   81 mg at 06/19/15 1002  . bisacodyl (DULCOLAX) suppository 10 mg  10 mg Rectal Q12H PRN Michael Boston, MD      . cholecalciferol (VITAMIN D) tablet 2,000 Units  2,000 Units Oral Daily Michael Boston, MD   2,000 Units at 06/19/15 1003  . diphenhydrAMINE (BENADRYL) 12.5 MG/5ML elixir 12.5 mg  12.5 mg Oral Q6H PRN [name], MD       Or  . diphenhydrAMINE (BENADRYL) injection 12.5 mg  12.5 mg Intravenous Q6H PRN [name], MD      . estrogens (conjugated) (PREMARIN) tablet 0.3 mg  0.3 mg Oral QODAY [name], MD   0.3 mg at 06/15/15 0952  . feeding supplement (BOOST / RESOURCE BREEZE) liquid 1 Container  1 Container Oral TID BM Michael Boston, MD   1 Container at 06/19/15 2200  . feeding supplement (ENSURE ENLIVE) (ENSURE ENLIVE) liquid 237 mL  237 mL Oral BID BM Michael Boston, MD      . hydrALAZINE (APRESOLINE) injection 10 mg  10 mg Intravenous Q2H PRN [name], MD   10 mg at 06/17/15 2542  . spironolactone (ALDACTONE) tablet 12.5 mg  12.5 mg Oral Daily [name], MD   12.5 mg at 06/20/15 1007   And  . hydrochlorothiazide (MICROZIDE) capsule 12.5 mg  12.5 mg Oral Daily [name],  MD   12.5 mg at 06/20/15 1007  . HYDROmorphone (DILAUDID) injection 0.5-2 mg  0.5-2 mg Intravenous Q2H PRN Michael Boston, MD      . ibuprofen (ADVIL,MOTRIN) tablet 400-800 mg  400-800 mg Oral Q6H PRN Michael Boston, MD      . lactulose (CHRONULAC) 10 GM/15ML solution 20 g  20 g Oral Q12H PRN Michael Boston, MD      . levothyroxine (SYNTHROID, LEVOTHROID) tablet 100 mcg  100 mcg Oral QAC breakfast [name], MD   100 mcg at 06/19/15 1004  . lip balm (CARMEX) ointment 1 application  1 application Topical BID Michael Boston, MD   1 application at 70/62/37 1010  . losartan (COZAAR) tablet 25 mg  25 mg Oral Daily Michael Boston, MD   25 mg at 06/20/15 1009  . magic mouthwash  15 mL Oral QID PRN Michael Boston, MD      . menthol-cetylpyridinium (CEPACOL) lozenge 3 mg  1 lozenge Oral PRN Michael Boston, MD      . methocarbamol (ROBAXIN) tablet 500 mg  500 mg Oral Q6H PRN [name], MD   500 mg at 06/17/15 2332  . metoCLOPramide (REGLAN) injection 5-10 mg  5-10 mg Intravenous Q6H PRN Michael Boston, MD      . multivitamin (PROSIGHT) tablet 1 tablet  1 tablet Oral Daily Michael Boston, MD   1 tablet at 06/18/15 1145  . niacin tablet 500 mg  500 mg Oral Q breakfast Michael Boston, MD   500 mg at 06/19/15 0800  . ondansetron (ZOFRAN) injection 4 mg  4 mg Intravenous Q6H PRN [name], MD   4 mg at 06/15/15 0830  . ondansetron (ZOFRAN-ODT) disintegrating tablet 4-8 mg  4-8 mg Oral Q6H PRN Michael Boston, MD      . phenol (CHLORASEPTIC) mouth spray 2 spray  2 spray Mouth/Throat PRN Michael Boston, MD      . polyethylene glycol (MIRALAX / GLYCOLAX) packet 17 g  17 g Oral BID Michael Boston, MD   17 g at 06/20/15 1000  . saccharomyces boulardii (FLORASTOR) capsule 250 mg  250 mg Oral BID Michael Boston, MD   250 mg at 06/20/15 1007  . simethicone (MYLICON) chewable tablet 80 mg  80 mg Oral QID PRN [name], MD      . simvastatin (ZOCOR) tablet 40 mg  40 mg Oral QODAY [name], MD   40 mg at 06/20/15 1007  . sodium  chloride 0.9 % injection 3 mL  3 mL Intravenous Q12H Michael Boston, MD   3 mL at 06/18/15 2032  . sodium chloride 0.9 % injection 3 mL  3 mL Intravenous PRN Michael Boston, MD   3 mL at 06/19/15 2248  . traMADol (ULTRAM) tablet 50-100  mg  50-100 mg Oral Q6H PRN Michael Boston, MD       Do not use this list as official medication orders. Please verify with discharge summary.  Discharge Medications:   Medication List    ASK your doctor about these medications        aspirin 81 MG tablet  Take 81 mg by mouth daily.     beta carotene w/minerals tablet  Take 1 tablet by mouth daily.     cholecalciferol 1000 UNITS tablet  Commonly known as:  VITAMIN D  Take 2,000 Units by mouth daily.     estrogens (conjugated) 0.3 MG tablet  Commonly known as:  PREMARIN  Take one tablet every other day for one month     feeding supplement Liqd  Take 1 Container by mouth 3 (three) times daily between meals.     levothyroxine 100 MCG tablet  Commonly known as:  SYNTHROID, LEVOTHROID  TAKE ONE TABLET BY MOUTH 30 MINUTES BEFORE BREAKFAST EVERY MORNING. SEPARATE FROM OTHER MEDICATIONS FOR THYROID.     LORazepam 0.5 MG tablet  Commonly known as:  ATIVAN  Take 1 tablet (0.5 mg total) by mouth every 6 (six) hours as needed for anxiety.     losartan 50 MG tablet  Commonly known as:  COZAAR  Take one tablet by mouth once daily for blood pressure     niacin 500 MG tablet  Take 500 mg by mouth daily with breakfast.     ondansetron 4 MG tablet  Commonly known as:  ZOFRAN  Take 1 tablet (4 mg total) by mouth every 8 (eight) hours as needed for nausea or vomiting.     OVER THE COUNTER MEDICATION  Preparation H cream as needed     polyethylene glycol packet  Commonly known as:  MIRALAX / GLYCOLAX  Take 17 g by mouth daily as needed (constipation).     PROBIOTIC DAILY PO  Take 1 tablet by mouth daily.     shark liver oil-cocoa butter 0.25-3-85.5 % suppository  Commonly known as:  PREPARATION H  Place  1 suppository rectally as needed for hemorrhoids.     simvastatin 40 MG tablet  Commonly known as:  ZOCOR  Take 1 tablet (40 mg total) by mouth every other day.     spironolactone-hydrochlorothiazide 25-25 MG tablet  Commonly known as:  ALDACTAZIDE  TAKE 1/2 TABLET BY MOUTH ONCE DAILY        Relevant Imaging Results:  Relevant Lab Results:  Recent Labs    Additional Information SSN: [id number] Pt has JP drain in place  Davin Archuletta A, LCSW

## 2015-06-21 LAB — COMPREHENSIVE METABOLIC PANEL
ALT: 74 U/L — AB (ref 14–54)
ANION GAP: 7 (ref 5–15)
AST: 35 U/L (ref 15–41)
Albumin: 3.5 g/dL (ref 3.5–5.0)
Alkaline Phosphatase: 101 U/L (ref 38–126)
BUN: 18 mg/dL (ref 6–20)
CHLORIDE: 99 mmol/L — AB (ref 101–111)
CO2: 28 mmol/L (ref 22–32)
Calcium: 10.6 mg/dL — ABNORMAL HIGH (ref 8.9–10.3)
Creatinine, Ser: 1.11 mg/dL — ABNORMAL HIGH (ref 0.44–1.00)
GFR calc non Af Amer: 43 mL/min — ABNORMAL LOW (ref >60)
GFR, EST AFRICAN AMERICAN: 50 mL/min — AB (ref >60)
Glucose, Bld: 142 mg/dL — ABNORMAL HIGH (ref 65–99)
Potassium: 3.9 mmol/L (ref 3.5–5.1)
SODIUM: 134 mmol/L — AB (ref 135–145)
Total Bilirubin: 1.3 mg/dL — ABNORMAL HIGH (ref 0.3–1.2)
Total Protein: 6.6 g/dL (ref 6.5–8.1)

## 2015-06-21 LAB — CBC
HCT: 36.8 % (ref 36.0–46.0)
Hemoglobin: 12.2 g/dL (ref 12.0–15.0)
MCH: 28.9 pg (ref 26.0–34.0)
MCHC: 33.2 g/dL (ref 30.0–36.0)
MCV: 87.2 fL (ref 78.0–100.0)
PLATELETS: 352 10*3/uL (ref 150–400)
RBC: 4.22 MIL/uL (ref 3.87–5.11)
RDW: 13.6 % (ref 11.5–15.5)
WBC: 18.4 10*3/uL — ABNORMAL HIGH (ref 4.0–10.5)

## 2015-06-21 MED ORDER — BISACODYL 10 MG RE SUPP
10.0000 mg | Freq: Every day | RECTAL | Status: DC
  Administered 2015-06-21: 10 mg via RECTAL
  Filled 2015-06-21 (×2): qty 1

## 2015-06-21 NOTE — Progress Notes (Signed)
CSW continuing to follow.   CSW followed up with pt at bedside today regarding disposition plan, but pt deferred conversation to pt daughter.   CSW contacted pt daughter, Bev via telephone. CSW discussed SNF bed offers with pt daughter. Pt daughter states that she feels pt will needs short term rehab prior to d/c home.   Pt daughter chooses bed at [facility] and [facility].   CSW notified [facility] and [facility] and confirmed facility could accept pt when medically stable for discharge.  CSW to continue to follow to provide support and assist with pt disposition needs.   Alison Murray, MSW, [facility] [phone]

## 2015-06-21 NOTE — Progress Notes (Signed)
Post voided bladder scan done, 0 volume.

## 2015-06-21 NOTE — Progress Notes (Signed)
On pt round pt asleep at this time/no needs, JP bulb to R abd emptied earlier this afternoon:65m drk brown liq drainage

## 2015-06-21 NOTE — Clinical Social Work Placement (Signed)
   CLINICAL SOCIAL WORK PLACEMENT  NOTE  Date:  06/21/2015  Patient Details  Name: Destiny Velasquez MRN: [id number] Date of Birth: 24-Dec-1925  Clinical Social Work is seeking post-discharge placement for this patient at the [place] level of care (*CSW will initial, date and re-position this form in  chart as items are completed):  Yes   Patient/family provided with [place] Work Department's list of facilities offering this level of care within the geographic area requested by the patient (or if unable, by the patient's family).      Patient/family informed of their freedom to choose among providers that offer the needed level of care, that participate in Medicare, Medicaid or managed care program needed by the patient, have an available bed and are willing to accept the patient.  Yes   Patient/family informed of [facility]'s ownership interest in [facility] and [facility], as well as of the fact that they are under no obligation to receive care at these facilities.  PASRR submitted to EDS on 06/20/15     PASRR number received on 06/20/15     Existing PASRR number confirmed on       FL2 transmitted to all facilities in geographic area requested by pt/family on 06/20/15     FL2 transmitted to all facilities within larger geographic area on       Patient informed that his/her managed care company has contracts with or will negotiate with certain facilities, including the following:        Yes   Patient/family informed of bed offers received.  Patient chooses bed at [facility]     Physician recommends and patient chooses bed at      Patient to be transferred to [facility] on  .  Patient to be transferred to facility by       Patient family notified on   of transfer.  Name of family member notified:        PHYSICIAN Please sign FL2     Additional Comment:    _______________________________________________ Ladell Pier,  LCSW 06/21/2015, 6:25 PM

## 2015-06-21 NOTE — Progress Notes (Signed)
Patient ID: Destiny Velasquez, female   DOB: 10/21/1925, 81 y.o.   MRN: [id number]  [address] [place], [place] [address] Phone: [phone] FAX: [phone]   Yavonne Velasquez [id number] Sep 02, 1925   Problem List:   Principal Problem:   Adenocarcinoma of liver s/p partial right hepatic lobectomy 06/14/2015 Active Problems:   Chronic constipation   Hypothyroidism   Essential hypertension   Subacute delirium   GERD (gastroesophageal reflux disease)   7 Days Post-Op  06/14/2015  PREOPERATIVE DIAGNOSIS: Adenocarcinoma of the liver, presumed primary.  POSTOPERATIVE DIAGNOSIS: Same  PROCEDURE PERFORMED: Diagnostic laparoscopy, partial right hepatic lobectomy  SURGEON: [name], MD  ASSISTANT: Jackolyn Confer, MD  Assessment  Still slow improvement.  Ileus slowly resolving.  Plan:  - recheck labs.  -mobilize  -Suppository today.  regular diet as tolerated.    -HTN control  -anxiolysis  -VTE prophylaxis- SCDs, etc   disposition - PT/OT evals.  CSW  Involved.  Prob will [facility] from [facility].  Daughter & pt concerned about d/c plans.  May need short term SNF to bridge to home.     [facility] [phone], [place], [place] [address] [phone] Main / Paging   06/21/2015  Subjective:  + flatus, still no BM.  Threw up with boost last night.  C/o gas pain.    Objective:  Vital signs:  Filed Vitals:   06/20/15 0523 06/20/15 1448 06/20/15 2027 06/21/15 0617  BP: 137/57 141/60 130/60 136/56  Pulse: 88 96 76 77  Temp: 97.7 F (36.5 C) 97.6 F (36.4 C) 98.2 F (36.8 C) 97.9 F (36.6 C)  TempSrc: Oral Oral Oral Oral  Resp: '20 20 18 17  '$ Height:      Weight:      SpO2: 96% 99% 94% 97%    Last BM Date: 06/13/15  Intake/Output   Yesterday:  11/07 0701 - 11/08 0700 In: 240 [P.O.:240] Out: 55 [Drains:55] This shift:     Physical Exam:  Gen - A&O x 3 resp -  breathing comfortably GI - abd soft, ND, approp tender.  Drain dark.  ? Sl bilious.    Results:   Labs: No results found for this or any previous visit (from the past 48 hour(s)).  Imaging / Studies: No results found.  Medications / Allergies: per chart  Antibiotics: Anti-infectives    Start     Dose/Rate Route Frequency Ordered Stop   06/14/15 2200  ceFAZolin (ANCEF) IVPB 2 g/50 mL premix     2 g 100 mL/hr over 30 Minutes Intravenous 3 times per day 06/14/15 1844 06/15/15 0119   06/14/15 1036  ceFAZolin (ANCEF) IVPB 2 g/50 mL premix     2 g 100 mL/hr over 30 Minutes Intravenous On call to O.R. 06/14/15 1036 06/14/15 1335        Note: Portions of this report may have been transcribed using voice recognition software. Every effort was made to ensure accuracy; however, inadvertent computerized transcription errors may be present.   Any transcriptional errors that result from this process are unintentional.      Gastrointestinal and Minimally Invasive Surgery [facility] [address], [place], [place] [address] [phone] Main / Paging   06/21/2015  CARE TEAM:  PCP: Hollace Kinnier, DO  Outpatient Care Team: Patient Care Team: Gayland Curry, DO as PCP - General (Geriatric Medicine)  Inpatient Treatment Team: Treatment Team: Attending Provider:  [name], MD; Technician: Marletta Lor, NT; Registered Nurse: Celedonio Savage, RN; Registered Nurse: Benjamine Mola, RN; Technician: Ileene Rubens, NT; Registered Nurse: Carilyn Goodpasture, RN; Registered Nurse: Lottie Dawson, RN; Technician: Toribio Harbour, NT; Technician: Latanya Maudlin, NT; Registered Nurse: Lars Masson, RN; Registered Nurse: Charlesetta Ivory, RN; Technician: Marzella Schlein, NT

## 2015-06-21 NOTE — Clinical Social Work Placement (Signed)
   CLINICAL SOCIAL WORK PLACEMENT  NOTE  Date:  06/21/2015  Patient Details  Name: Karah Caruthers MRN: [id number] Date of Birth: 31-Jan-1926  Clinical Social Work is seeking post-discharge placement for this patient at the [place] level of care (*CSW will initial, date and re-position this form in  chart as items are completed):  Yes   Patient/family provided with [place] Work Department's list of facilities offering this level of care within the geographic area requested by the patient (or if unable, by the patient's family).      Patient/family informed of their freedom to choose among providers that offer the needed level of care, that participate in Medicare, Medicaid or managed care program needed by the patient, have an available bed and are willing to accept the patient.  Yes   Patient/family informed of [facility]'s ownership interest in [facility] and [facility], as well as of the fact that they are under no obligation to receive care at these facilities.  PASRR submitted to EDS on 06/20/15     PASRR number received on 06/20/15     Existing PASRR number confirmed on       FL2 transmitted to all facilities in geographic area requested by pt/family on 06/20/15     FL2 transmitted to all facilities within larger geographic area on       Patient informed that his/her managed care company has contracts with or will negotiate with certain facilities, including the following:        Yes   Patient/family informed of bed offers received.  Patient chooses bed at [facility]     Physician recommends and patient chooses bed at      Patient to be transferred to   on  .  Patient to be transferred to facility by       Patient family notified on   of transfer.  Name of family member notified:        PHYSICIAN Please sign FL2     Additional Comment:    _______________________________________________ Ladell Pier, LCSW 06/21/2015,  12:11 PM

## 2015-06-22 ENCOUNTER — Inpatient Hospital Stay (HOSPITAL_COMMUNITY): Payer: Medicare Other

## 2015-06-22 LAB — COMPREHENSIVE METABOLIC PANEL
ALBUMIN: 3.3 g/dL — AB (ref 3.5–5.0)
ALK PHOS: 89 U/L (ref 38–126)
ALT: 55 U/L — ABNORMAL HIGH (ref 14–54)
ANION GAP: 7 (ref 5–15)
AST: 21 U/L (ref 15–41)
BUN: 23 mg/dL — ABNORMAL HIGH (ref 6–20)
CALCIUM: 10.4 mg/dL — AB (ref 8.9–10.3)
CHLORIDE: 98 mmol/L — AB (ref 101–111)
CO2: 28 mmol/L (ref 22–32)
Creatinine, Ser: 1.28 mg/dL — ABNORMAL HIGH (ref 0.44–1.00)
GFR calc non Af Amer: 36 mL/min — ABNORMAL LOW (ref >60)
GFR, EST AFRICAN AMERICAN: 42 mL/min — AB (ref >60)
GLUCOSE: 126 mg/dL — AB (ref 65–99)
POTASSIUM: 3.7 mmol/L (ref 3.5–5.1)
SODIUM: 133 mmol/L — AB (ref 135–145)
Total Bilirubin: 1.3 mg/dL — ABNORMAL HIGH (ref 0.3–1.2)
Total Protein: 6.2 g/dL — ABNORMAL LOW (ref 6.5–8.1)

## 2015-06-22 LAB — URINALYSIS, ROUTINE W REFLEX MICROSCOPIC
BILIRUBIN URINE: NEGATIVE
Glucose, UA: NEGATIVE mg/dL
Ketones, ur: NEGATIVE mg/dL
NITRITE: NEGATIVE
Protein, ur: 30 mg/dL — AB
SPECIFIC GRAVITY, URINE: 1.013 (ref 1.005–1.030)
UROBILINOGEN UA: 0.2 mg/dL (ref 0.0–1.0)
pH: 6 (ref 5.0–8.0)

## 2015-06-22 LAB — CBC
HCT: 34.2 % — ABNORMAL LOW (ref 36.0–46.0)
HEMOGLOBIN: 11.6 g/dL — AB (ref 12.0–15.0)
MCH: 29.4 pg (ref 26.0–34.0)
MCHC: 33.9 g/dL (ref 30.0–36.0)
MCV: 86.6 fL (ref 78.0–100.0)
PLATELETS: 345 10*3/uL (ref 150–400)
RBC: 3.95 MIL/uL (ref 3.87–5.11)
RDW: 13.5 % (ref 11.5–15.5)
WBC: 16.6 10*3/uL — ABNORMAL HIGH (ref 4.0–10.5)

## 2015-06-22 LAB — URINE MICROSCOPIC-ADD ON

## 2015-06-22 MED ORDER — ALUM & MAG HYDROXIDE-SIMETH 200-200-20 MG/5ML PO SUSP: 30.0000 mL | Freq: Four times a day (QID) | ORAL | Status: DC | PRN

## 2015-06-22 MED ORDER — ACETAMINOPHEN 325 MG PO TABS: 325.0000 mg | ORAL_TABLET | Freq: Four times a day (QID) | ORAL | Status: DC | PRN

## 2015-06-22 MED ORDER — LEVOFLOXACIN 500 MG PO TABS
500.0000 mg | ORAL_TABLET | Freq: Every day | ORAL | Status: DC
  Administered 2015-06-22 – 2015-06-23 (×2): 500 mg via ORAL
  Filled 2015-06-22 (×2): qty 1

## 2015-06-22 MED ORDER — IBUPROFEN 400 MG PO TABS: 400.0000 mg | ORAL_TABLET | Freq: Two times a day (BID) | ORAL | Status: DC | PRN

## 2015-06-22 MED ORDER — HYDROCODONE-ACETAMINOPHEN 5-325 MG PO TABS: 1.0000 | ORAL_TABLET | Freq: Four times a day (QID) | ORAL | Status: DC | PRN

## 2015-06-22 MED ORDER — IBUPROFEN 200 MG PO TABS: 400.0000 mg | ORAL_TABLET | Freq: Two times a day (BID) | ORAL | Status: DC | PRN

## 2015-06-22 MED ORDER — TRAMADOL HCL 50 MG PO TABS: 50.0000 mg | ORAL_TABLET | Freq: Four times a day (QID) | ORAL | Status: DC | PRN

## 2015-06-22 MED ORDER — HYDROCODONE-ACETAMINOPHEN 5-325 MG PO TABS
1.0000 | ORAL_TABLET | Freq: Four times a day (QID) | ORAL | Status: DC | PRN
  Administered 2015-06-22: 1 via ORAL
  Filled 2015-06-22: qty 1

## 2015-06-22 MED ORDER — GADOBENATE DIMEGLUMINE 529 MG/ML IV SOLN
10.0000 mL | Freq: Once | INTRAVENOUS | Status: AC | PRN
  Administered 2015-06-22: 7 mL via INTRAVENOUS

## 2015-06-22 MED ORDER — SIMETHICONE 80 MG PO CHEW: 80.0000 mg | CHEWABLE_TABLET | Freq: Four times a day (QID) | ORAL | Status: DC | PRN

## 2015-06-22 NOTE — Progress Notes (Signed)
[facility] Mac pratt removed by the charge nurse of 5W, All staples removed , tolerated well.steristrips applied as ordered, incision line clean and dry.

## 2015-06-22 NOTE — Progress Notes (Signed)
Physical Therapy Treatment Patient Details Name: Destiny Velasquez MRN: [id number] DOB: 06/21/1926 Today's Date: 2015-07-20    History of Present Illness 81 yo female adm 06/14/15 with liver mass, s/p Diagnostic laparoscopy, partial right hepatic lobectomy PMHx:  right mastectomy, HTN    PT Comments    Pt ambulated good distance in hallway however requiring cues for safety and technique as well as occasional steadying assist during mobility.  Pt and daughter reports plan for d/c to SNF for rehab prior to home.  Pt would benefit from SNF, so updated d/c recommendations.   Follow Up Recommendations  SNF     Equipment Recommendations  Rolling walker with 5" wheels    Recommendations for Other Services       Precautions / Restrictions Precautions Precautions: Fall    Mobility  Bed Mobility               General bed mobility comments: pt up in recliner on arrival  Transfers Overall transfer level: Needs assistance Equipment used: Rolling walker (2 wheeled) Transfers: Sit to/from Stand Sit to Stand: Min assist         General transfer comment: verbal cues for hand positioning from recliner and toilet, assist to steady while looking down to tie gown  Ambulation/Gait Ambulation/Gait assistance: Min assist Ambulation Distance (Feet): 200 Feet Assistive device: Rolling walker (2 wheeled) Gait Pattern/deviations: Step-through pattern;Decreased stride length Gait velocity: decreased   General Gait Details: verbal cues for posture, occasional slight assist to steady with challenges such as head turning and backwards walking   Stairs            Wheelchair Mobility    Modified Rankin (Stroke Patients Only)       Balance           Standing balance support: Bilateral upper extremity supported;During functional activity Standing balance-Leahy Scale: Poor Standing balance comment: requires UE support                    Cognition Arousal/Alertness:  Awake/alert Behavior During Therapy: WFL for tasks assessed/performed Overall Cognitive Status: Within Functional Limits for tasks assessed                      Exercises      General Comments        Pertinent Vitals/Pain Pain Assessment: No/denies pain    Home Living                      Prior Function            PT Goals (current goals can now be found in the care plan section) Progress towards PT goals: Progressing toward goals    Frequency  Min 3X/week    PT Plan Discharge plan needs to be updated    Co-evaluation             End of Session   Activity Tolerance: Patient tolerated treatment well Patient left: with call bell/phone within reach;in chair;with chair alarm set;with family/visitor present     Time: 1053-1106 PT Time Calculation (min) (ACUTE ONLY): 13 min  Charges:  $Gait Training: 8-22 mins                    G Codes:      China Deitrick,KATHrine E Jul 20, 2015, 1:16 PM Carmelia Bake, PT, DPT 07/20/15 Pager: [phone]

## 2015-06-22 NOTE — Progress Notes (Signed)
Patient ID: Destiny Velasquez, female   DOB: 23-Jan-1926, 81 y.o.   MRN: [id number]  [address] [place], [place] [address] Phone: [phone] FAX: [phone]   Destiny Velasquez [id number] 06-Jun-1926   Problem List:   Principal Problem:   Adenocarcinoma of liver s/p partial right hepatic lobectomy 06/14/2015 Active Problems:   Chronic constipation   Hypothyroidism   Essential hypertension   Subacute delirium   GERD (gastroesophageal reflux disease)   8 Days Post-Op  06/14/2015  PREOPERATIVE DIAGNOSIS: Adenocarcinoma of the liver, presumed primary.  POSTOPERATIVE DIAGNOSIS: Same  PROCEDURE PERFORMED: Diagnostic laparoscopy, partial right hepatic lobectomy  SURGEON: [name], MD  ASSISTANT: Jackolyn Confer, MD  Assessment  Improved, but still feels bad.  .  Plan:  - recheck labs. Cr up a bit.  WBCs still elevated.    -mobilize  -HTN control  -anxiolysis  -VTE prophylaxis- SCDs, etc  Neuro - per daughter, mental status still poor despite essentially negative CT and no motor deficit.  Pt more mentally slow.  Usually can do crossword in 10  Min.  Now confused about it.    Check UA.    disposition - PT/OT evals. Has bed for short term snf.    [facility] [phone] [address], [place], [place] [address] [phone] Main / Paging   06/22/2015  Subjective:  + flatus, still no BM.  Threw up with boost last night.  C/o gas pain.    Objective:  Vital signs:  Filed Vitals:   06/21/15 1250 06/21/15 1718 06/21/15 2043 06/22/15 0542  BP: 131/58 113/55 122/57 127/62  Pulse: 89 100 96 93  Temp: 98.5 F (36.9 C) 98.5 F (36.9 C) 98.4 F (36.9 C) 98.6 F (37 C)  TempSrc: Oral Oral Oral Oral  Resp: _0 Height:      Weight:      SpO2: 100% 96% 95% 96%    Last BM Date: 06/21/15  Intake/Output   Yesterday:  11/08 0701 - 11/09 0700 In: 31 [P.O.:950] Out:  475 [Urine:400; Drains:75] This shift:  Total I/O In: 120 [P.O.:120] Out: 15 [Drains:15]  Physical Exam:  Gen - A&O x 3 resp - breathing comfortably GI - abd soft, ND, approp tender.  Drain dark.  ? Sl bilious.    Results:   Labs: Results for orders placed or performed during the hospital encounter of 06/14/15 (from the past 48 hour(s))  CBC     Status: Abnormal   Collection Time: 06/21/15  8:10 AM  Result Value Ref Range   WBC 18.4 (H) 4.0 - 10.5 K/uL   RBC 4.22 3.87 - 5.11 MIL/uL   Hemoglobin 12.2 12.0 - 15.0 g/dL   HCT 36.8 36.0 - 46.0 %   MCV 87.2 78.0 - 100.0 fL   MCH 28.9 26.0 - 34.0 pg   MCHC 33.2 30.0 - 36.0 g/dL   RDW 13.6 11.5 - 15.5 %   Platelets 352 150 - 400 K/uL  Comprehensive metabolic panel     Status: Abnormal   Collection Time: 06/21/15  8:10 AM  Result Value Ref Range   Sodium 134 (L) 135 - 145 mmol/L   Potassium 3.9 3.5 - 5.1 mmol/L   Chloride 99 (L) 101 - 111 mmol/L   CO2 28 22 - 32 mmol/L   Glucose, Bld 142 (H) 65 - 99 mg/dL   BUN 18 6 - 20 mg/dL   Creatinine, Ser 1.11 (H)  0.44 - 1.00 mg/dL   Calcium 10.6 (H) 8.9 - 10.3 mg/dL   Total Protein 6.6 6.5 - 8.1 g/dL   Albumin 3.5 3.5 - 5.0 g/dL   AST 35 15 - 41 U/L   ALT 74 (H) 14 - 54 U/L   Alkaline Phosphatase 101 38 - 126 U/L   Total Bilirubin 1.3 (H) 0.3 - 1.2 mg/dL   GFR calc non Af Amer 43 (L) >60 mL/min   GFR calc Af Amer 50 (L) >60 mL/min    Comment: (NOTE) The eGFR has been calculated using the CKD EPI equation. This calculation has not been validated in all clinical situations. eGFR's persistently <60 mL/min signify possible Chronic Kidney Disease.    Anion gap 7 5 - 15  CBC     Status: Abnormal   Collection Time: 06/22/15  3:43 AM  Result Value Ref Range   WBC 16.6 (H) 4.0 - 10.5 K/uL   RBC 3.95 3.87 - 5.11 MIL/uL   Hemoglobin 11.6 (L) 12.0 - 15.0 g/dL   HCT 34.2 (L) 36.0 - 46.0 %   MCV 86.6 78.0 - 100.0 fL   MCH 29.4 26.0 - 34.0 pg   MCHC 33.9 30.0 - 36.0 g/dL   RDW 13.5 11.5  - 15.5 %   Platelets 345 150 - 400 K/uL  Comprehensive metabolic panel     Status: Abnormal   Collection Time: 06/22/15  3:43 AM  Result Value Ref Range   Sodium 133 (L) 135 - 145 mmol/L   Potassium 3.7 3.5 - 5.1 mmol/L   Chloride 98 (L) 101 - 111 mmol/L   CO2 28 22 - 32 mmol/L   Glucose, Bld 126 (H) 65 - 99 mg/dL   BUN 23 (H) 6 - 20 mg/dL   Creatinine, Ser 1.28 (H) 0.44 - 1.00 mg/dL   Calcium 10.4 (H) 8.9 - 10.3 mg/dL   Total Protein 6.2 (L) 6.5 - 8.1 g/dL   Albumin 3.3 (L) 3.5 - 5.0 g/dL   AST 21 15 - 41 U/L   ALT 55 (H) 14 - 54 U/L   Alkaline Phosphatase 89 38 - 126 U/L   Total Bilirubin 1.3 (H) 0.3 - 1.2 mg/dL   GFR calc non Af Amer 36 (L) >60 mL/min   GFR calc Af Amer 42 (L) >60 mL/min    Comment: (NOTE) The eGFR has been calculated using the CKD EPI equation. This calculation has not been validated in all clinical situations. eGFR's persistently <60 mL/min signify possible Chronic Kidney Disease.    Anion gap 7 5 - 15    Imaging / Studies: No results found.  Medications / Allergies: per chart  Antibiotics: Anti-infectives    Start     Dose/Rate Route Frequency Ordered Stop   06/22/15 1200  levofloxacin (LEVAQUIN) tablet 500 mg     500 mg Oral Daily 06/22/15 1150     06/14/15 2200  ceFAZolin (ANCEF) IVPB 2 g/50 mL premix     2 g 100 mL/hr over 30 Minutes Intravenous 3 times per day 06/14/15 1844 06/15/15 0119   06/14/15 1036  ceFAZolin (ANCEF) IVPB 2 g/50 mL premix     2 g 100 mL/hr over 30 Minutes Intravenous On call to O.R. 06/14/15 1036 06/14/15 1335        Note: Portions of this report may have been transcribed using voice recognition software. Every effort was made to ensure accuracy; however, inadvertent computerized transcription errors may be present.   Any transcriptional errors that result  from this process are unintentional.      Gastrointestinal and Minimally Invasive Surgery [facility] [address] [place], [place] [address] [phone] Main / Paging   06/22/2015  CARE TEAM:  PCP: Hollace Kinnier, DO  Outpatient Care Team: Patient Care Team: Gayland Curry, DO as PCP - General (Geriatric Medicine)  Inpatient Treatment Team: Treatment Team: Attending Provider: [name], MD; Technician: Marletta Lor, NT; Registered Nurse: Celedonio Savage, RN; Registered Nurse: Benjamine Mola, RN; Technician: Ileene Rubens, NT; Registered Nurse: Carilyn Goodpasture, RN; Registered Nurse: Lottie Dawson, RN; Technician: Latanya Maudlin, NT; Registered Nurse: Lars Masson, RN; Registered Nurse: Charlesetta Ivory, RN; Rounding Team: Nolon Nations, MD; Technician: Suzanna Obey, NT; Physical Therapist: Junius Argyle, PT; Technician: Lucas Mallow, NT

## 2015-06-22 NOTE — Clinical Social Work Note (Signed)
Clinical Social Work Assessment-Late Entry  Patient Details  Name: Destiny Velasquez MRN: [id number] Date of Birth: 1926/06/14  Date of referral:  06/20/15               Reason for consult:  Discharge Planning                Permission sought to share information with:  Family Supports Permission granted to share information::  Yes, Verbal Permission Granted  Name::     Jerald Kief  Agency::     Relationship::  daughter  Contact Information:  [phone]  Housing/Transportation Living arrangements for the past 2 months:  Single Family Home Source of Information:  Patient, Spouse, Adult Children Patient Interpreter Needed:  None Criminal Activity/Legal Involvement Pertinent to Current Situation/Hospitalization:  No - Comment as needed Significant Relationships:  Adult Children, Spouse Lives with:  Spouse Do you feel safe going back to the place where you live?  No Need for family participation in patient care:  Yes (Comment) (pt request family participation)  Care giving concerns:  Pt admitted from home with pt 21 year old husband. Pt daughter concerned about pt ability to manage at home following hospitalization. MD and PT recommendation for short term rehab as a bridge to home.   Social Worker assessment / plan:  CSW met with pt and pt spouse at bedside. CSW introduced self and explained role. CSW explored pt feelings surrounding disposition plan. Pt was hopeful to return home, but recognizes that she is progressing slowly with ambulating and will have wound to manage at home. CSW discussed short term rehab. Pt stated that pt daughter, Bev discussed this with her and pt agreeable to exploring option. Pt asked CSW to contact pt daughter, Bev to discuss as pt defers the decision to pt daughter.   CSW contacted pt daughter, Bev via telephone. CSW introduced self and explained role. CSW provided support to pt daughter surrounding concerns about pt return home. CSW discussed SNF for rehab and pt  daughter agreeable to [facility] SNF search. Pt daughter mentioned interest in [place].   CSW completed FL2 and initiated SNF search to [facility]. CSW to follow up with pt re: SNF bed offers.   CSW to continue to follow to provide support and assist with pt discharge planning needs.   Employment status:  Retired Health visitor PT Recommendations:  Home with [place], [place], [place] / Referral to community resources:  [place]  Patient/Family's Response to care:  Pt alert and oriented x 4. Pt eager to get home, but recognizes that she has been making slow progress. Pt agreeable to SNF for rehab would be a good bridge to home. Pt daughter in agreement to plan.  Patient/Family's Understanding of and Emotional Response to Diagnosis, Current Treatment, and Prognosis:  Pt and pt daughter display a lot of knowledge surrounding pt diagnosis and treatment plan. Pt daughter is grateful pt agreeable to consider SNF as pt daughter does not feel pt 79 year old husband is sufficient support at home.   Emotional Assessment Appearance:  Appears stated age Attitude/Demeanor/Rapport:  Other (pt appropriate) Affect (typically observed):  Accepting, Appropriate Orientation:  Oriented to Self, Oriented to Place, Oriented to  Time, Oriented to Situation Alcohol / Substance use:  Not Applicable Psych involvement (Current and /or in the community):  No (Comment)  Discharge Needs  Concerns to be addressed:  Discharge Planning Concerns Readmission within the last 30 days:  No Current discharge risk:  None Barriers to Discharge:  Continued Medical Work up   Lenard Simmer [phone]

## 2015-06-22 NOTE — Care Management Important Message (Signed)
Important Message  Patient Details  Name: Destiny Velasquez MRN: [id number] Date of Birth: 02/20/1926   Medicare Important Message Given:  Yes-third notification given    Camillo Flaming 06/22/2015, 11:42 AMImportant Message  Patient Details  Name: Destiny Velasquez MRN: [id number] Date of Birth: [birth year]   Medicare Important Message Given:  Yes-third notification given    Camillo Flaming 06/22/2015, 11:41 AM

## 2015-06-23 DIAGNOSIS — R110 Nausea: Secondary | ICD-10-CM | POA: Diagnosis not present

## 2015-06-23 DIAGNOSIS — Z7982 Long term (current) use of aspirin: Secondary | ICD-10-CM | POA: Diagnosis not present

## 2015-06-23 DIAGNOSIS — Z961 Presence of intraocular lens: Secondary | ICD-10-CM | POA: Diagnosis present

## 2015-06-23 DIAGNOSIS — F411 Generalized anxiety disorder: Secondary | ICD-10-CM | POA: Diagnosis not present

## 2015-06-23 DIAGNOSIS — Y839 Surgical procedure, unspecified as the cause of abnormal reaction of the patient, or of later complication, without mention of misadventure at the time of the procedure: Secondary | ICD-10-CM | POA: Diagnosis present

## 2015-06-23 DIAGNOSIS — F05 Delirium due to known physiological condition: Secondary | ICD-10-CM | POA: Diagnosis not present

## 2015-06-23 DIAGNOSIS — L0291 Cutaneous abscess, unspecified: Secondary | ICD-10-CM | POA: Diagnosis not present

## 2015-06-23 DIAGNOSIS — Z9049 Acquired absence of other specified parts of digestive tract: Secondary | ICD-10-CM | POA: Diagnosis not present

## 2015-06-23 DIAGNOSIS — Z79891 Long term (current) use of opiate analgesic: Secondary | ICD-10-CM | POA: Diagnosis not present

## 2015-06-23 DIAGNOSIS — K567 Ileus, unspecified: Secondary | ICD-10-CM | POA: Diagnosis not present

## 2015-06-23 DIAGNOSIS — E559 Vitamin D deficiency, unspecified: Secondary | ICD-10-CM | POA: Diagnosis not present

## 2015-06-23 DIAGNOSIS — R143 Flatulence: Secondary | ICD-10-CM | POA: Diagnosis present

## 2015-06-23 DIAGNOSIS — E039 Hypothyroidism, unspecified: Secondary | ICD-10-CM | POA: Diagnosis present

## 2015-06-23 DIAGNOSIS — Z9841 Cataract extraction status, right eye: Secondary | ICD-10-CM | POA: Diagnosis not present

## 2015-06-23 DIAGNOSIS — F419 Anxiety disorder, unspecified: Secondary | ICD-10-CM | POA: Diagnosis not present

## 2015-06-23 DIAGNOSIS — N183 Chronic kidney disease, stage 3 (moderate): Secondary | ICD-10-CM | POA: Diagnosis present

## 2015-06-23 DIAGNOSIS — Z79899 Other long term (current) drug therapy: Secondary | ICD-10-CM | POA: Diagnosis not present

## 2015-06-23 DIAGNOSIS — K219 Gastro-esophageal reflux disease without esophagitis: Secondary | ICD-10-CM | POA: Diagnosis present

## 2015-06-23 DIAGNOSIS — N179 Acute kidney failure, unspecified: Secondary | ICD-10-CM | POA: Diagnosis not present

## 2015-06-23 DIAGNOSIS — Z791 Long term (current) use of non-steroidal anti-inflammatories (NSAID): Secondary | ICD-10-CM | POA: Diagnosis not present

## 2015-06-23 DIAGNOSIS — K750 Abscess of liver: Secondary | ICD-10-CM | POA: Diagnosis present

## 2015-06-23 DIAGNOSIS — R29810 Facial weakness: Secondary | ICD-10-CM | POA: Diagnosis not present

## 2015-06-23 DIAGNOSIS — I129 Hypertensive chronic kidney disease with stage 1 through stage 4 chronic kidney disease, or unspecified chronic kidney disease: Secondary | ICD-10-CM | POA: Diagnosis present

## 2015-06-23 DIAGNOSIS — N390 Urinary tract infection, site not specified: Secondary | ICD-10-CM | POA: Diagnosis not present

## 2015-06-23 DIAGNOSIS — K649 Unspecified hemorrhoids: Secondary | ICD-10-CM | POA: Diagnosis not present

## 2015-06-23 DIAGNOSIS — B9620 Unspecified Escherichia coli [E. coli] as the cause of diseases classified elsewhere: Secondary | ICD-10-CM | POA: Diagnosis present

## 2015-06-23 DIAGNOSIS — R41841 Cognitive communication deficit: Secondary | ICD-10-CM | POA: Diagnosis not present

## 2015-06-23 DIAGNOSIS — R627 Adult failure to thrive: Secondary | ICD-10-CM | POA: Diagnosis present

## 2015-06-23 DIAGNOSIS — C229 Malignant neoplasm of liver, not specified as primary or secondary: Secondary | ICD-10-CM | POA: Diagnosis not present

## 2015-06-23 DIAGNOSIS — Z8249 Family history of ischemic heart disease and other diseases of the circulatory system: Secondary | ICD-10-CM | POA: Diagnosis not present

## 2015-06-23 DIAGNOSIS — R634 Abnormal weight loss: Secondary | ICD-10-CM | POA: Diagnosis not present

## 2015-06-23 DIAGNOSIS — M6281 Muscle weakness (generalized): Secondary | ICD-10-CM | POA: Diagnosis not present

## 2015-06-23 DIAGNOSIS — E876 Hypokalemia: Secondary | ICD-10-CM | POA: Diagnosis not present

## 2015-06-23 DIAGNOSIS — Z9223 Personal history of estrogen therapy: Secondary | ICD-10-CM | POA: Diagnosis not present

## 2015-06-23 DIAGNOSIS — Z90710 Acquired absence of both cervix and uterus: Secondary | ICD-10-CM | POA: Diagnosis not present

## 2015-06-23 DIAGNOSIS — Z87891 Personal history of nicotine dependence: Secondary | ICD-10-CM | POA: Diagnosis not present

## 2015-06-23 DIAGNOSIS — R1310 Dysphagia, unspecified: Secondary | ICD-10-CM | POA: Diagnosis not present

## 2015-06-23 DIAGNOSIS — Z853 Personal history of malignant neoplasm of breast: Secondary | ICD-10-CM | POA: Diagnosis not present

## 2015-06-23 DIAGNOSIS — Z809 Family history of malignant neoplasm, unspecified: Secondary | ICD-10-CM | POA: Diagnosis not present

## 2015-06-23 DIAGNOSIS — C227 Other specified carcinomas of liver: Secondary | ICD-10-CM | POA: Diagnosis present

## 2015-06-23 DIAGNOSIS — D72829 Elevated white blood cell count, unspecified: Secondary | ICD-10-CM | POA: Diagnosis not present

## 2015-06-23 DIAGNOSIS — N189 Chronic kidney disease, unspecified: Secondary | ICD-10-CM | POA: Diagnosis not present

## 2015-06-23 DIAGNOSIS — F0390 Unspecified dementia without behavioral disturbance: Secondary | ICD-10-CM | POA: Diagnosis not present

## 2015-06-23 DIAGNOSIS — E785 Hyperlipidemia, unspecified: Secondary | ICD-10-CM | POA: Diagnosis present

## 2015-06-23 DIAGNOSIS — R69 Illness, unspecified: Secondary | ICD-10-CM | POA: Diagnosis not present

## 2015-06-23 DIAGNOSIS — R1031 Right lower quadrant pain: Secondary | ICD-10-CM | POA: Diagnosis not present

## 2015-06-23 DIAGNOSIS — G8929 Other chronic pain: Secondary | ICD-10-CM | POA: Diagnosis not present

## 2015-06-23 DIAGNOSIS — Z483 Aftercare following surgery for neoplasm: Secondary | ICD-10-CM | POA: Diagnosis not present

## 2015-06-23 DIAGNOSIS — R188 Other ascites: Secondary | ICD-10-CM | POA: Diagnosis present

## 2015-06-23 DIAGNOSIS — R109 Unspecified abdominal pain: Secondary | ICD-10-CM | POA: Diagnosis not present

## 2015-06-23 DIAGNOSIS — N12 Tubulo-interstitial nephritis, not specified as acute or chronic: Secondary | ICD-10-CM | POA: Diagnosis not present

## 2015-06-23 DIAGNOSIS — Z8505 Personal history of malignant neoplasm of liver: Secondary | ICD-10-CM | POA: Diagnosis not present

## 2015-06-23 DIAGNOSIS — K5900 Constipation, unspecified: Secondary | ICD-10-CM | POA: Diagnosis not present

## 2015-06-23 DIAGNOSIS — E860 Dehydration: Secondary | ICD-10-CM | POA: Diagnosis present

## 2015-06-23 DIAGNOSIS — I10 Essential (primary) hypertension: Secondary | ICD-10-CM | POA: Diagnosis not present

## 2015-06-23 DIAGNOSIS — R251 Tremor, unspecified: Secondary | ICD-10-CM | POA: Diagnosis present

## 2015-06-23 DIAGNOSIS — K651 Peritoneal abscess: Secondary | ICD-10-CM | POA: Diagnosis not present

## 2015-06-23 DIAGNOSIS — Z1623 Resistance to quinolones and fluoroquinolones: Secondary | ICD-10-CM | POA: Diagnosis present

## 2015-06-23 DIAGNOSIS — R4182 Altered mental status, unspecified: Secondary | ICD-10-CM | POA: Diagnosis not present

## 2015-06-23 DIAGNOSIS — E440 Moderate protein-calorie malnutrition: Secondary | ICD-10-CM | POA: Diagnosis present

## 2015-06-23 DIAGNOSIS — K5732 Diverticulitis of large intestine without perforation or abscess without bleeding: Secondary | ICD-10-CM | POA: Diagnosis present

## 2015-06-23 DIAGNOSIS — E871 Hypo-osmolality and hyponatremia: Secondary | ICD-10-CM | POA: Diagnosis present

## 2015-06-23 DIAGNOSIS — T814XXA Infection following a procedure, initial encounter: Secondary | ICD-10-CM | POA: Diagnosis present

## 2015-06-23 DIAGNOSIS — M899 Disorder of bone, unspecified: Secondary | ICD-10-CM | POA: Diagnosis present

## 2015-06-23 DIAGNOSIS — Z9842 Cataract extraction status, left eye: Secondary | ICD-10-CM | POA: Diagnosis not present

## 2015-06-23 LAB — COMPREHENSIVE METABOLIC PANEL
ALBUMIN: 3.1 g/dL — AB (ref 3.5–5.0)
ALK PHOS: 90 U/L (ref 38–126)
ALT: 45 U/L (ref 14–54)
AST: 22 U/L (ref 15–41)
Anion gap: 9 (ref 5–15)
BUN: 26 mg/dL — AB (ref 6–20)
CALCIUM: 10.5 mg/dL — AB (ref 8.9–10.3)
CHLORIDE: 94 mmol/L — AB (ref 101–111)
CO2: 27 mmol/L (ref 22–32)
CREATININE: 1.36 mg/dL — AB (ref 0.44–1.00)
GFR calc non Af Amer: 34 mL/min — ABNORMAL LOW (ref >60)
GFR, EST AFRICAN AMERICAN: 39 mL/min — AB (ref >60)
GLUCOSE: 141 mg/dL — AB (ref 65–99)
Potassium: 4.1 mmol/L (ref 3.5–5.1)
SODIUM: 130 mmol/L — AB (ref 135–145)
Total Bilirubin: 1.1 mg/dL (ref 0.3–1.2)
Total Protein: 6.6 g/dL (ref 6.5–8.1)

## 2015-06-23 LAB — CBC
HCT: 36.5 % (ref 36.0–46.0)
Hemoglobin: 11.9 g/dL — ABNORMAL LOW (ref 12.0–15.0)
MCH: 28.3 pg (ref 26.0–34.0)
MCHC: 32.6 g/dL (ref 30.0–36.0)
MCV: 86.7 fL (ref 78.0–100.0)
PLATELETS: 344 10*3/uL (ref 150–400)
RBC: 4.21 MIL/uL (ref 3.87–5.11)
RDW: 13.5 % (ref 11.5–15.5)
WBC: 13.7 10*3/uL — ABNORMAL HIGH (ref 4.0–10.5)

## 2015-06-23 MED ORDER — SODIUM CHLORIDE 0.9 % IV BOLUS (SEPSIS): 500.0000 mL | Freq: Once | INTRAVENOUS | Status: DC

## 2015-06-23 MED ORDER — LEVOFLOXACIN 500 MG PO TABS: 500.0000 mg | ORAL_TABLET | Freq: Every day | ORAL | Status: DC

## 2015-06-23 NOTE — Discharge Summary (Signed)
Physician Discharge Summary  Patient ID: Destiny Velasquez MRN: [id number] DOB/AGE: 17-Jul-1926 81 y.o.  Admit date: 06/14/2015 Discharge date: 06/23/2015  Admission Diagnoses: Patient Active Problem List   Diagnosis Date Noted  . Subacute delirium   . GERD (gastroesophageal reflux disease)   . Adenocarcinoma of liver s/p partial right hepatic lobectomy 06/14/2015 06/14/2015  . At risk for colon cancer 05/17/2015  . Unintentional weight loss 05/06/2015  . Hypothyroidism 05/03/2015  . Essential hypertension 05/03/2015  . Chronic constipation 04/15/2014  . Arthralgia of right knee 01/26/2013  . Arthralgia of right lower leg 01/26/2013  . Hyperlipidemia   . Thyroid disease   . Memory loss   . Disorder of bone and cartilage, unspecified     Discharge Diagnoses:  Principal Problem:   Adenocarcinoma of liver s/p partial right hepatic lobectomy 06/14/2015 Active Problems:   Chronic constipation   Hypothyroidism   Essential hypertension   Subacute delirium   GERD (gastroesophageal reflux disease)  UTI- Culture pending  Discharged Condition: stable  Hospital Course: Pt was admitted to the ICU following partial right hepatectomy.  She initially had some oliguria and bumped her creatinine a bit.  She did require 2 units of blood on POD 2.  She initially had some hypotension which resolved with albumin and blood.  She had a slight facial droop and had head CT which was essentially negative.  She was transferred upstairs on POD 4.  She had an anticipated post op ileus, and it gradually resolved.  She required a suppository to have a BM.  She complained of some dribbling and issues with not feeling when she has to urinate.  Post void residuals were zero; it did not look like she had any overflow incontinence.  A U/a was positive for high leukocytes.  There were many squamous epithelial cells, but she also had many bacteria and TNTC white cells on microscopy.  She was started on antibiotics.  She and  her daughter stated that she did not feel "like herself," but all neuro exams were negative for any motor or sensory deficits.  She required assistance with ambulation.  She is having bowel movements.  Her wound looks good.  Drain was removed prior to discharge.    Her white count was up.  We are rechecking it prior to discharge to make sure that antibiotics seem to be working.  Also, her creatinine bumped around between 0.9 and 1.3.  She is otherwise well.  She is tolerating regular diet and having bowel movements.  She has required minimal narcotics, and mostly is using tylenol, occasional tramadol, and occasional motrin.    She will be discharged if labs look improved or stable.    Consults: None  Significant Diagnostic Studies: labs: Cr 1.28, WBCs 16.6  Treatments: surgery: see above  Discharge Exam: Blood pressure 109/55, pulse 71, temperature 98.1 F (36.7 C), temperature source Oral, resp. rate 16, height '5\' 3"'$  (1.6 m), weight 65.318 kg (144 lb), SpO2 93 %. General appearance: alert, cooperative and no distress Resp: breathing comfortably Cardio: regular rate and rhythm GI: soft, non distended, wound intact without erythema or drainage. Extremities: extremities normal, atraumatic, no cyanosis or edema Neurologic: Grossly normal  Disposition: SNF  Discharge Instructions    Call MD for:  persistant nausea and vomiting    Complete by:  As directed      Call MD for:  redness, tenderness, or signs of infection (pain, swelling, redness, odor or green/yellow discharge around incision site)    Complete  by:  As directed      Call MD for:  severe uncontrolled pain    Complete by:  As directed      Call MD for:  temperature >100.4    Complete by:  As directed      Diet - low sodium heart healthy    Complete by:  As directed      Driving Restrictions    Complete by:  As directed   UNTIL SEEN BY ME     Increase activity slowly    Complete by:  As directed      No dressing needed     Complete by:  As directed             Medication List    STOP taking these medications        OVER THE COUNTER MEDICATION      TAKE these medications        acetaminophen 325 MG tablet  Commonly known as:  TYLENOL  Take 1-2 tablets (325-650 mg total) by mouth every 6 (six) hours as needed for fever, headache, mild pain or moderate pain.     alum & mag hydroxide-simeth 200-200-20 MG/5ML suspension  Commonly known as:  MAALOX/MYLANTA  Take 30 mLs by mouth every 6 (six) hours as needed for indigestion or heartburn (or bloating).     aspirin 81 MG tablet  Take 81 mg by mouth daily.     beta carotene w/minerals tablet  Take 1 tablet by mouth daily.     cholecalciferol 1000 UNITS tablet  Commonly known as:  VITAMIN D  Take 2,000 Units by mouth daily.     estrogens (conjugated) 0.3 MG tablet  Commonly known as:  PREMARIN  Take one tablet every other day for one month     feeding supplement Liqd  Take 1 Container by mouth 3 (three) times daily between meals.     HYDROcodone-acetaminophen 5-325 MG tablet  Commonly known as:  NORCO/VICODIN  Take 1 tablet by mouth every 6 (six) hours as needed for moderate pain.     ibuprofen 400 MG tablet  Commonly known as:  ADVIL,MOTRIN  Take 1-2 tablets (400-800 mg total) by mouth every 12 (twelve) hours as needed for fever or headache.     levofloxacin 500 MG tablet  Commonly known as:  LEVAQUIN  Take 1 tablet (500 mg total) by mouth daily.     levothyroxine 100 MCG tablet  Commonly known as:  SYNTHROID, LEVOTHROID  TAKE ONE TABLET BY MOUTH 30 MINUTES BEFORE BREAKFAST EVERY MORNING. SEPARATE FROM OTHER MEDICATIONS FOR THYROID.     LORazepam 0.5 MG tablet  Commonly known as:  ATIVAN  Take 1 tablet (0.5 mg total) by mouth every 6 (six) hours as needed for anxiety.     losartan 50 MG tablet  Commonly known as:  COZAAR  Take one tablet by mouth once daily for blood pressure     niacin 500 MG tablet  Take 500 mg by mouth daily  with breakfast.     ondansetron 4 MG tablet  Commonly known as:  ZOFRAN  Take 1 tablet (4 mg total) by mouth every 8 (eight) hours as needed for nausea or vomiting.     polyethylene glycol packet  Commonly known as:  MIRALAX / GLYCOLAX  Take 17 g by mouth daily as needed (constipation).     PROBIOTIC DAILY PO  Take 1 tablet by mouth daily.     shark liver oil-cocoa butter 0.25-3-85.5 %  suppository  Commonly known as:  PREPARATION H  Place 1 suppository rectally as needed for hemorrhoids.     simethicone 80 MG chewable tablet  Commonly known as:  MYLICON  Chew 1 tablet (80 mg total) by mouth 4 (four) times daily as needed for flatulence.     simvastatin 40 MG tablet  Commonly known as:  ZOCOR  Take 1 tablet (40 mg total) by mouth every other day.     spironolactone-hydrochlorothiazide 25-25 MG tablet  Commonly known as:  ALDACTAZIDE  TAKE 1/2 TABLET BY MOUTH ONCE DAILY     traMADol 50 MG tablet  Commonly known as:  ULTRAM  Take 1-2 tablets (50-100 mg total) by mouth every 6 (six) hours as needed for moderate pain or severe pain.         SignedStark Klein 06/23/2015, 8:19 AM

## 2015-06-23 NOTE — Progress Notes (Signed)
Pt for discharge to [facility] and [employer].   CSW facilitated pt discharge needs including contacting facility, faxing pt discharge information to facility, discussing with pt and pt daughter at bedside, providing RN phone number to call report, and providing discharge packet to pt daughter to provide to [facility] upon arrival to SNF.  Pt and pt daughter appreciative of CSW assistance. Pt eager to start rehab in order to return home.   No further social work needs identified at this time.  CSW signing off.   Alison Murray, MSW, [facility] [phone]

## 2015-06-24 DIAGNOSIS — C229 Malignant neoplasm of liver, not specified as primary or secondary: Secondary | ICD-10-CM | POA: Diagnosis not present

## 2015-06-24 DIAGNOSIS — F0390 Unspecified dementia without behavioral disturbance: Secondary | ICD-10-CM | POA: Diagnosis not present

## 2015-06-24 DIAGNOSIS — N390 Urinary tract infection, site not specified: Secondary | ICD-10-CM | POA: Diagnosis not present

## 2015-06-24 DIAGNOSIS — F05 Delirium due to known physiological condition: Secondary | ICD-10-CM | POA: Diagnosis not present

## 2015-06-26 DIAGNOSIS — N390 Urinary tract infection, site not specified: Secondary | ICD-10-CM | POA: Diagnosis not present

## 2015-06-26 DIAGNOSIS — R1031 Right lower quadrant pain: Secondary | ICD-10-CM | POA: Diagnosis not present

## 2015-06-26 DIAGNOSIS — D72829 Elevated white blood cell count, unspecified: Secondary | ICD-10-CM | POA: Diagnosis not present

## 2015-06-30 DIAGNOSIS — F411 Generalized anxiety disorder: Secondary | ICD-10-CM | POA: Diagnosis not present

## 2015-06-30 DIAGNOSIS — D72829 Elevated white blood cell count, unspecified: Secondary | ICD-10-CM | POA: Diagnosis not present

## 2015-06-30 DIAGNOSIS — R109 Unspecified abdominal pain: Secondary | ICD-10-CM | POA: Diagnosis not present

## 2015-07-01 ENCOUNTER — Inpatient Hospital Stay (HOSPITAL_COMMUNITY)
Admission: EM | Admit: 2015-07-01 | Discharge: 2015-07-11 | DRG: 689 | Disposition: A | Discharge status: Skilled Nursing Facility | Payer: Medicare Other | Attending: Internal Medicine | Admitting: Internal Medicine

## 2015-07-01 ENCOUNTER — Encounter (HOSPITAL_COMMUNITY): Payer: Self-pay | Admitting: Emergency Medicine

## 2015-07-01 DIAGNOSIS — N12 Tubulo-interstitial nephritis, not specified as acute or chronic: Principal | ICD-10-CM | POA: Diagnosis present

## 2015-07-01 DIAGNOSIS — N390 Urinary tract infection, site not specified: Secondary | ICD-10-CM

## 2015-07-01 DIAGNOSIS — N179 Acute kidney failure, unspecified: Secondary | ICD-10-CM | POA: Diagnosis not present

## 2015-07-01 DIAGNOSIS — C227 Other specified carcinomas of liver: Secondary | ICD-10-CM | POA: Diagnosis present

## 2015-07-01 DIAGNOSIS — E039 Hypothyroidism, unspecified: Secondary | ICD-10-CM | POA: Diagnosis present

## 2015-07-01 DIAGNOSIS — I129 Hypertensive chronic kidney disease with stage 1 through stage 4 chronic kidney disease, or unspecified chronic kidney disease: Secondary | ICD-10-CM | POA: Diagnosis present

## 2015-07-01 DIAGNOSIS — Z79891 Long term (current) use of opiate analgesic: Secondary | ICD-10-CM

## 2015-07-01 DIAGNOSIS — K219 Gastro-esophageal reflux disease without esophagitis: Secondary | ICD-10-CM | POA: Diagnosis present

## 2015-07-01 DIAGNOSIS — L0291 Cutaneous abscess, unspecified: Secondary | ICD-10-CM | POA: Diagnosis present

## 2015-07-01 DIAGNOSIS — E871 Hypo-osmolality and hyponatremia: Secondary | ICD-10-CM | POA: Diagnosis present

## 2015-07-01 DIAGNOSIS — Z8505 Personal history of malignant neoplasm of liver: Secondary | ICD-10-CM

## 2015-07-01 DIAGNOSIS — I10 Essential (primary) hypertension: Secondary | ICD-10-CM | POA: Diagnosis present

## 2015-07-01 DIAGNOSIS — Z7982 Long term (current) use of aspirin: Secondary | ICD-10-CM

## 2015-07-01 DIAGNOSIS — E440 Moderate protein-calorie malnutrition: Secondary | ICD-10-CM | POA: Diagnosis not present

## 2015-07-01 DIAGNOSIS — R188 Other ascites: Secondary | ICD-10-CM | POA: Diagnosis present

## 2015-07-01 DIAGNOSIS — R634 Abnormal weight loss: Secondary | ICD-10-CM

## 2015-07-01 DIAGNOSIS — E079 Disorder of thyroid, unspecified: Secondary | ICD-10-CM | POA: Diagnosis present

## 2015-07-01 DIAGNOSIS — Y839 Surgical procedure, unspecified as the cause of abnormal reaction of the patient, or of later complication, without mention of misadventure at the time of the procedure: Secondary | ICD-10-CM | POA: Diagnosis present

## 2015-07-01 DIAGNOSIS — D72829 Elevated white blood cell count, unspecified: Secondary | ICD-10-CM | POA: Diagnosis present

## 2015-07-01 DIAGNOSIS — Z8249 Family history of ischemic heart disease and other diseases of the circulatory system: Secondary | ICD-10-CM

## 2015-07-01 DIAGNOSIS — Z961 Presence of intraocular lens: Secondary | ICD-10-CM | POA: Diagnosis present

## 2015-07-01 DIAGNOSIS — K750 Abscess of liver: Secondary | ICD-10-CM | POA: Diagnosis present

## 2015-07-01 DIAGNOSIS — E782 Mixed hyperlipidemia: Secondary | ICD-10-CM | POA: Diagnosis present

## 2015-07-01 DIAGNOSIS — R627 Adult failure to thrive: Secondary | ICD-10-CM | POA: Diagnosis present

## 2015-07-01 DIAGNOSIS — K5732 Diverticulitis of large intestine without perforation or abscess without bleeding: Secondary | ICD-10-CM | POA: Diagnosis present

## 2015-07-01 DIAGNOSIS — IMO0002 Reserved for concepts with insufficient information to code with codable children: Secondary | ICD-10-CM | POA: Diagnosis present

## 2015-07-01 DIAGNOSIS — Z853 Personal history of malignant neoplasm of breast: Secondary | ICD-10-CM

## 2015-07-01 DIAGNOSIS — E785 Hyperlipidemia, unspecified: Secondary | ICD-10-CM | POA: Diagnosis present

## 2015-07-01 DIAGNOSIS — E860 Dehydration: Secondary | ICD-10-CM | POA: Diagnosis present

## 2015-07-01 DIAGNOSIS — Z87891 Personal history of nicotine dependence: Secondary | ICD-10-CM

## 2015-07-01 DIAGNOSIS — R143 Flatulence: Secondary | ICD-10-CM | POA: Diagnosis present

## 2015-07-01 DIAGNOSIS — Z90710 Acquired absence of both cervix and uterus: Secondary | ICD-10-CM

## 2015-07-01 DIAGNOSIS — N183 Chronic kidney disease, stage 3 (moderate): Secondary | ICD-10-CM | POA: Diagnosis present

## 2015-07-01 DIAGNOSIS — Z791 Long term (current) use of non-steroidal anti-inflammatories (NSAID): Secondary | ICD-10-CM

## 2015-07-01 DIAGNOSIS — C787 Secondary malignant neoplasm of liver and intrahepatic bile duct: Secondary | ICD-10-CM | POA: Diagnosis present

## 2015-07-01 DIAGNOSIS — R4182 Altered mental status, unspecified: Secondary | ICD-10-CM | POA: Diagnosis not present

## 2015-07-01 DIAGNOSIS — B9620 Unspecified Escherichia coli [E. coli] as the cause of diseases classified elsewhere: Secondary | ICD-10-CM | POA: Diagnosis present

## 2015-07-01 DIAGNOSIS — Z1623 Resistance to quinolones and fluoroquinolones: Secondary | ICD-10-CM | POA: Diagnosis present

## 2015-07-01 DIAGNOSIS — Z9842 Cataract extraction status, left eye: Secondary | ICD-10-CM

## 2015-07-01 DIAGNOSIS — Z9841 Cataract extraction status, right eye: Secondary | ICD-10-CM

## 2015-07-01 DIAGNOSIS — R251 Tremor, unspecified: Secondary | ICD-10-CM | POA: Diagnosis present

## 2015-07-01 DIAGNOSIS — Z809 Family history of malignant neoplasm, unspecified: Secondary | ICD-10-CM

## 2015-07-01 DIAGNOSIS — N189 Chronic kidney disease, unspecified: Secondary | ICD-10-CM

## 2015-07-01 DIAGNOSIS — E876 Hypokalemia: Secondary | ICD-10-CM | POA: Diagnosis not present

## 2015-07-01 DIAGNOSIS — R110 Nausea: Secondary | ICD-10-CM

## 2015-07-01 DIAGNOSIS — K567 Ileus, unspecified: Secondary | ICD-10-CM | POA: Diagnosis not present

## 2015-07-01 DIAGNOSIS — M899 Disorder of bone, unspecified: Secondary | ICD-10-CM | POA: Diagnosis present

## 2015-07-01 DIAGNOSIS — C229 Malignant neoplasm of liver, not specified as primary or secondary: Secondary | ICD-10-CM

## 2015-07-01 DIAGNOSIS — Z79899 Other long term (current) drug therapy: Secondary | ICD-10-CM

## 2015-07-01 DIAGNOSIS — Z9049 Acquired absence of other specified parts of digestive tract: Secondary | ICD-10-CM

## 2015-07-01 DIAGNOSIS — R1310 Dysphagia, unspecified: Secondary | ICD-10-CM | POA: Diagnosis present

## 2015-07-01 DIAGNOSIS — R05 Cough: Secondary | ICD-10-CM | POA: Diagnosis not present

## 2015-07-01 DIAGNOSIS — T814XXA Infection following a procedure, initial encounter: Secondary | ICD-10-CM | POA: Diagnosis present

## 2015-07-01 NOTE — ED Notes (Signed)
Brought in by EMS from [facility] SNF with c/o elevated WBC.  Per EMS, pt has had WBC of 15.8 (drawn yesterday)----- pt's  HCPOA/responsible party wanted pt to be further evaluated and treated.  Pt has recently completed her 7-day course of Levaquin 500 mg for UTI.

## 2015-07-01 NOTE — ED Provider Notes (Signed)
CSN: [id number]     Arrival date & time 07/01/15  2322 History  By signing my name below, I, Irene Pap, attest that this documentation has been prepared under the direction and in the presence of [employer], PA-C. Electronically Signed: Irene Pap, ED Scribe. 07/01/2015. 12:02 AM.   Chief Complaint  Patient presents with  . Elevated WBC    The history is provided by a caregiver, medical records and the patient. No language interpreter was used.  HPI Comments: Destiny Velasquez is a 81 y.o. Female with a hx of HTN, thyroid disease, breast cancer and liver cancer brought in by EMS who presents to the Emergency Department complaining of elevated WBC onset one day ago. Per EMS, pt had labs drawn yesterday that showed a WBC count of 15.8. Pt's caretaker wanted pt to be seen and evaluated.    Pt's daughter provides much of the history.  Pt was admitted on 06/14/15 for liver adenocarcinoma and subsequently underwent an open right hepatectomy.  She was d/c to rehab on 06/23/15.  At that time she was diagnosed with a UTI and recently completely 7 day course of Levaquin 500 mg. Caregiver states "she has not bounced back since the surgery." She reports that pt has lost weight, has regurgitation problems, and has been lethargic. Pt received her nighttime medication without difficulty tonight (Tramadol and Ativan). Pt reports RUQ abdominal pain onset 1 hour ago and relative states that the pt is always cold at her home. She also states that pt has been having trouble with passing gas since having a cholecystectomy and has been having diarrhea since yesterday. She denies fever, cough, SOB, leg swelling, hematochezia, dysuria, or drainage from surgical wound.   Pt's daughter is at bedside and reports that her sister is a physician who evaluated her mother tonight with concerns for a possible surgical infection.  She reports that her sister discussed the case with Dr. Barry Dienes who performed the surgery and  reports that they are here in the emergency department to be evaluated by Dr. Harlow Asa.    Past Medical History  Diagnosis Date  . Hypertension   . Hyperlipidemia   . Thyroid disease   . Subacute delirium   . Essential and other specified forms of tremor   . Unspecified hypothyroidism   . Diverticulosis of colon (without mention of hemorrhage)   . Unspecified constipation   . Anal and rectal polyp   . Disorder of bone and cartilage, unspecified   . Pneumonia X 1  . GERD (gastroesophageal reflux disease)   . Malignant neoplasm of breast (female), unspecified site   . Breast cancer, right breast ([place]) 1971    "never had chemo or radiation"  . Liver cancer ([place]) 2016  . Diverticulitis of large intestine without perforation or abscess without bleeding    Past Surgical History  Procedure Laterality Date  . Appendectomy    . Abdominal hysterectomy    . Tonsillectomy and adenoidectomy  1935  . Cholecystectomy    . Breast biopsy Right 1971  . Mastectomy Right 1971  . Tubal ligation    . Cataract extraction w/ intraocular lens  implant, bilateral Bilateral   . Rectal polypectomy  early 2000's  . Laparoscopy N/A 06/14/2015    Procedure: LAPAROSCOPY DIAGNOSTIC;  Surgeon: [name], MD;  Location: [facility] ORS;  Service: General;  Laterality: N/A;  . Open partial hepatectomy [83] N/A 06/14/2015    Procedure:  OPEN PARTIAL RIGHT HEPATECTOMY;  Surgeon: [name], MD;  Location:  [facility] ORS;  Service: General;  Laterality: N/A;  converted to open @ 1447  . Liver biopsy  06/14/2015    Procedure: LIVER BIOPSY;  Surgeon: [name], MD;  Location: [facility] ORS;  Service: General;;   Family History  Problem Relation Age of Onset  . Cancer Mother   . Hypertension Father    Social History  Substance Use Topics  . Smoking status: Former Smoker -- 0.50 packs/day for 40 years    Types: Cigarettes    Quit date: 08/13/2012  . Smokeless tobacco: Never Used     Comment: "quit smoking cigarettes years ago; I  was probably in my 51's"  . Alcohol Use: 3.0 oz/week    5 Glasses of wine per week     Comment: once per month    OB History    No data available     Review of Systems  Constitutional: Negative for fever, diaphoresis, appetite change, fatigue and unexpected weight change.  HENT: Negative for mouth sores.   Eyes: Negative for visual disturbance.  Respiratory: Negative for cough, chest tightness, shortness of breath and wheezing.   Cardiovascular: Negative for chest pain and leg swelling.  Gastrointestinal: Positive for abdominal pain and diarrhea. Negative for nausea, vomiting and constipation.  Endocrine: Negative for polydipsia, polyphagia and polyuria.  Genitourinary: Negative for dysuria, urgency, frequency and hematuria.  Musculoskeletal: Negative for back pain and neck stiffness.  Skin: Negative for rash.  Allergic/Immunologic: Negative for immunocompromised state.  Neurological: Negative for syncope, light-headedness and headaches.  Hematological: Does not bruise/bleed easily.  Psychiatric/Behavioral: Negative for sleep disturbance. The patient is not nervous/anxious.    Allergies  Review of patient's allergies indicates no known allergies.  Home Medications   Prior to Admission medications   Medication Sig Start Date End Date Taking? Authorizing Provider  acetaminophen (TYLENOL) 325 MG tablet Take 1-2 tablets (325-650 mg total) by mouth every 6 (six) hours as needed for fever, headache, mild pain or moderate pain. 06/22/15  Yes [name], MD  alum & mag hydroxide-simeth (MAALOX/MYLANTA) 200-200-20 MG/5ML suspension Take 30 mLs by mouth every 6 (six) hours as needed for indigestion or heartburn (or bloating). 06/22/15  Yes [name], MD  aspirin 81 MG tablet Take 81 mg by mouth daily.   Yes Historical Provider, MD  HYDROcodone-acetaminophen (NORCO/VICODIN) 5-325 MG tablet Take 1 tablet by mouth every 6 (six) hours as needed for moderate pain. 06/22/15  Yes [name], MD   ibuprofen (ADVIL,MOTRIN) 400 MG tablet Take 1-2 tablets (400-800 mg total) by mouth every 12 (twelve) hours as needed for fever or headache. 06/22/15  Yes [name], MD  levothyroxine (SYNTHROID, LEVOTHROID) 100 MCG tablet TAKE ONE TABLET BY MOUTH 30 MINUTES BEFORE BREAKFAST EVERY MORNING. SEPARATE FROM OTHER MEDICATIONS FOR THYROID. 12/20/14  Yes [name] L [name], DO  LORazepam (ATIVAN) 0.5 MG tablet Take 1 tablet (0.5 mg total) by mouth every 6 (six) hours as needed for anxiety. Patient taking differently: Take 0.5 mg by mouth every 6 (six) hours as needed for anxiety. Pt does take in prn through out the day but takes it every night at 9pm 05/09/15  Yes [name] L [name], DO  losartan (COZAAR) 50 MG tablet Take one tablet by mouth once daily for blood pressure Patient taking differently: Take 50 mg by mouth daily. Take one tablet by mouth once daily for blood pressure 02/28/15  Yes [name] L [name], DO  ondansetron (ZOFRAN) 4 MG tablet Take 1 tablet (4 mg total) by mouth every 8 (  eight) hours as needed for nausea or vomiting. 05/09/15  Yes [name] L [name], DO  OVER THE COUNTER MEDICATION Take 1 Container by mouth 3 (three) times daily. Med pass   Yes Historical Provider, MD  polyethylene glycol (MIRALAX / GLYCOLAX) packet Take 17 g by mouth daily.    Yes Historical Provider, MD  Probiotic Product (PROBIOTIC DAILY PO) Take 1 tablet by mouth daily.    Yes Historical Provider, MD  simethicone (MYLICON) 175 MG chewable tablet Chew 125 mg by mouth 3 (three) times daily.   Yes Historical Provider, MD  spironolactone-hydrochlorothiazide (ALDACTAZIDE) 25-25 MG per tablet TAKE 1/2 TABLET BY MOUTH ONCE DAILY 04/11/15  Yes [name] L [name], DO  traMADol (ULTRAM) 50 MG tablet Take 1-2 tablets (50-100 mg total) by mouth every 6 (six) hours as needed for moderate pain or severe pain. 06/22/15  Yes [name], MD  estrogens, conjugated, (PREMARIN) 0.3 MG tablet Take one tablet every other day for one month Patient not taking:  Reported on 07/01/2015 03/18/14   [name] L [name], DO  feeding supplement (BOOST / RESOURCE BREEZE) LIQD Take 1 Container by mouth 3 (three) times daily between meals. Patient not taking: Reported on 06/08/2015 05/06/15   Nishant Dhungel, MD  levofloxacin (LEVAQUIN) 500 MG tablet Take 1 tablet (500 mg total) by mouth daily. Patient not taking: Reported on 07/01/2015 06/23/15   [name], MD  shark liver oil-cocoa butter (PREPARATION H) 0.25-3-85.5 % suppository Place 1 suppository rectally as needed for hemorrhoids.    Historical Provider, MD  simethicone (MYLICON) 80 MG chewable tablet Chew 1 tablet (80 mg total) by mouth 4 (four) times daily as needed for flatulence. Patient not taking: Reported on 07/01/2015 06/22/15   [name], MD  simvastatin (ZOCOR) 40 MG tablet Take 1 tablet (40 mg total) by mouth every other day. Patient not taking: Reported on 07/01/2015 04/14/15   [name] L [name], DO   BP 125/51 mmHg  Pulse 78  Temp(Src) 98.1 F (36.7 C) (Oral)  Resp 20  SpO2 98%\  Physical Exam  Constitutional: She appears well-developed and well-nourished. No distress.  Awake, alert, nontoxic appearance  HENT:  Head: Normocephalic and atraumatic.  Mouth/Throat: Oropharynx is clear and moist. No oropharyngeal exudate.  Eyes: Conjunctivae are normal. No scleral icterus.  Neck: Normal range of motion. Neck supple.  Cardiovascular: Normal rate, regular rhythm, normal heart sounds and intact distal pulses.   No murmur heard. Pulmonary/Chest: Effort normal and breath sounds normal. No respiratory distress. She has no wheezes.  Equal chest expansion Slightly diminished breath sounds throughout.  Abdominal: Soft. Bowel sounds are normal. She exhibits no mass. There is no tenderness. There is no rebound and no guarding.  Abdomen is soft and nontender Well-healing surgical scars in the right upper quadrant  Musculoskeletal: Normal range of motion. She exhibits no edema.  Neurological: She is  alert.  Speech is clear and goal oriented Moves extremities without ataxia  Skin: Skin is warm and dry. She is not diaphoretic.  Psychiatric: She has a normal mood and affect.  Nursing note and vitals reviewed.   ED Course  Procedures (including critical care time) DIAGNOSTIC STUDIES: Oxygen Saturation is 98% on RA, normal by my interpretation.    COORDINATION OF CARE: 12:00 AM-Discussed treatment plan which includes labs and consult with Dr. Catalina Antigua with pt at bedside and pt agreed to plan.   Labs Review Labs Reviewed  COMPREHENSIVE METABOLIC PANEL - Abnormal; Notable for the following:    Sodium 132 (*)  Chloride 97 (*)    Glucose, Bld 144 (*)    BUN 41 (*)    Creatinine, Ser 1.82 (*)    Calcium 11.0 (*)    Albumin 3.0 (*)    GFR calc non Af Amer 24 (*)    GFR calc Af Amer 28 (*)    All other components within normal limits  CBC WITH DIFFERENTIAL/PLATELET - Abnormal; Notable for the following:    WBC 14.5 (*)    Hemoglobin 11.4 (*)    HCT 33.9 (*)    Neutro Abs 10.8 (*)    Monocytes Absolute 1.4 (*)    All other components within normal limits  URINALYSIS, ROUTINE W REFLEX MICROSCOPIC (NOT AT [facility]) - Abnormal; Notable for the following:    APPearance TURBID (*)    Hgb urine dipstick MODERATE (*)    Protein, ur 30 (*)    Leukocytes, UA LARGE (*)    All other components within normal limits  URINE MICROSCOPIC-ADD ON - Abnormal; Notable for the following:    Squamous Epithelial / LPF 0-5 (*)    Bacteria, UA MANY (*)    All other components within normal limits  CULTURE, BLOOD (ROUTINE X 2)  CULTURE, BLOOD (ROUTINE X 2)  URINE CULTURE  I-STAT CG4 LACTIC ACID, ED    Imaging Review Dg Chest 2 View  07/02/2015  CLINICAL DATA:  Right upper quadrant pain. Cough with swallowing. Leukocytosis. EXAM: CHEST  2 VIEW COMPARISON:  PET-CT 05/17/2015 FINDINGS: Heart at the upper limits of normal in size, atherosclerosis of the thoracic aorta. Linear opacity in the right  perihilar lung consistent with atelectasis. No consolidation to suggest pneumonia. Pulmonary vasculature is normal. No consolidation, pleural effusion, or pneumothorax. No acute osseous abnormalities are seen. IMPRESSION: Linear right perihilar atelectasis.  No pneumonia. Electronically Signed   By: Jeb Levering M.D.   On: 07/02/2015 00:47   I have personally reviewed and evaluated these images and lab results as part of my medical decision-making.   EKG Interpretation None      MDM   Final diagnoses:  UTI (lower urinary tract infection)  Acute-on-chronic kidney injury ([place])  Adenocarcinoma of liver s/p partial right hepatic lobectomy 06/14/2015  Unintentional weight loss   [name] presents with elevated WBC, increasing general illness, decreasing appetite and weight loss. Patient with recent surgical history and UTI.  Patient with recent hospitalization, antibiotics in the last 90 days and current nursing home resident.  1:04 AM Pt discussed with Dr. Harlow Asa of [facility] who states that he did indeed speak with the patient's daughter however he did not agreed to see the patient here. In fact he recommended the patient be evaluated by the physician at [facility] tomorrow.  Will complete workup here in the emergency department.  Surgical pathology is found we will reconsult Dr.Gerkin at that time.    3:43 AM Labs with improving white blood cell count however urinalysis continues to have pyuria. Urine culture sent.  Concern for persistent heat 3 times a day now with worsening creatinine. The patient will need admission for potential pyelonephritis and IV antibiotics.  Patient given Rocephin here in the emergency department. Chest x-ray without evidence of pneumonia, linear right perihilar atelectasis is noted.   The patient was discussed with and seen by Dr. Leonides Schanz who agrees with the treatment plan.  I personally performed the services described in this documentation, which was scribed in  my presence. The recorded information has been reviewed and is accurate.  Abigail Butts, PA-C 07/02/15 [place], DO 07/02/15 [phone]

## 2015-07-01 NOTE — ED Notes (Signed)
Bed: WA08 Expected date:  Expected time:  Means of arrival:  Comments: EMS [place] abn labs

## 2015-07-02 ENCOUNTER — Emergency Department (HOSPITAL_COMMUNITY): Payer: Medicare Other

## 2015-07-02 DIAGNOSIS — N189 Chronic kidney disease, unspecified: Secondary | ICD-10-CM

## 2015-07-02 DIAGNOSIS — C229 Malignant neoplasm of liver, not specified as primary or secondary: Secondary | ICD-10-CM | POA: Diagnosis not present

## 2015-07-02 DIAGNOSIS — Z8505 Personal history of malignant neoplasm of liver: Secondary | ICD-10-CM | POA: Diagnosis not present

## 2015-07-02 DIAGNOSIS — K219 Gastro-esophageal reflux disease without esophagitis: Secondary | ICD-10-CM | POA: Diagnosis present

## 2015-07-02 DIAGNOSIS — C227 Other specified carcinomas of liver: Secondary | ICD-10-CM | POA: Diagnosis present

## 2015-07-02 DIAGNOSIS — K5732 Diverticulitis of large intestine without perforation or abscess without bleeding: Secondary | ICD-10-CM | POA: Diagnosis present

## 2015-07-02 DIAGNOSIS — R279 Unspecified lack of coordination: Secondary | ICD-10-CM | POA: Diagnosis not present

## 2015-07-02 DIAGNOSIS — Z961 Presence of intraocular lens: Secondary | ICD-10-CM | POA: Diagnosis present

## 2015-07-02 DIAGNOSIS — R1310 Dysphagia, unspecified: Secondary | ICD-10-CM

## 2015-07-02 DIAGNOSIS — C228 Malignant neoplasm of liver, primary, unspecified as to type: Secondary | ICD-10-CM | POA: Diagnosis not present

## 2015-07-02 DIAGNOSIS — N390 Urinary tract infection, site not specified: Secondary | ICD-10-CM | POA: Diagnosis not present

## 2015-07-02 DIAGNOSIS — D72829 Elevated white blood cell count, unspecified: Secondary | ICD-10-CM | POA: Diagnosis present

## 2015-07-02 DIAGNOSIS — Z1623 Resistance to quinolones and fluoroquinolones: Secondary | ICD-10-CM | POA: Diagnosis present

## 2015-07-02 DIAGNOSIS — Z79891 Long term (current) use of opiate analgesic: Secondary | ICD-10-CM | POA: Diagnosis not present

## 2015-07-02 DIAGNOSIS — Z90710 Acquired absence of both cervix and uterus: Secondary | ICD-10-CM | POA: Diagnosis not present

## 2015-07-02 DIAGNOSIS — E440 Moderate protein-calorie malnutrition: Secondary | ICD-10-CM | POA: Diagnosis present

## 2015-07-02 DIAGNOSIS — N179 Acute kidney failure, unspecified: Secondary | ICD-10-CM | POA: Diagnosis not present

## 2015-07-02 DIAGNOSIS — B9620 Unspecified Escherichia coli [E. coli] as the cause of diseases classified elsewhere: Secondary | ICD-10-CM | POA: Diagnosis present

## 2015-07-02 DIAGNOSIS — N12 Tubulo-interstitial nephritis, not specified as acute or chronic: Secondary | ICD-10-CM | POA: Diagnosis not present

## 2015-07-02 DIAGNOSIS — E876 Hypokalemia: Secondary | ICD-10-CM | POA: Diagnosis not present

## 2015-07-02 DIAGNOSIS — R143 Flatulence: Secondary | ICD-10-CM | POA: Diagnosis present

## 2015-07-02 DIAGNOSIS — R1314 Dysphagia, pharyngoesophageal phase: Secondary | ICD-10-CM | POA: Diagnosis not present

## 2015-07-02 DIAGNOSIS — Z853 Personal history of malignant neoplasm of breast: Secondary | ICD-10-CM | POA: Diagnosis not present

## 2015-07-02 DIAGNOSIS — I129 Hypertensive chronic kidney disease with stage 1 through stage 4 chronic kidney disease, or unspecified chronic kidney disease: Secondary | ICD-10-CM | POA: Diagnosis present

## 2015-07-02 DIAGNOSIS — K651 Peritoneal abscess: Secondary | ICD-10-CM | POA: Diagnosis not present

## 2015-07-02 DIAGNOSIS — E785 Hyperlipidemia, unspecified: Secondary | ICD-10-CM | POA: Diagnosis present

## 2015-07-02 DIAGNOSIS — T814XXA Infection following a procedure, initial encounter: Secondary | ICD-10-CM | POA: Diagnosis present

## 2015-07-02 DIAGNOSIS — R05 Cough: Secondary | ICD-10-CM | POA: Diagnosis not present

## 2015-07-02 DIAGNOSIS — Z791 Long term (current) use of non-steroidal anti-inflammatories (NSAID): Secondary | ICD-10-CM | POA: Diagnosis not present

## 2015-07-02 DIAGNOSIS — Z8249 Family history of ischemic heart disease and other diseases of the circulatory system: Secondary | ICD-10-CM | POA: Diagnosis not present

## 2015-07-02 DIAGNOSIS — N183 Chronic kidney disease, stage 3 (moderate): Secondary | ICD-10-CM | POA: Diagnosis present

## 2015-07-02 DIAGNOSIS — Z9049 Acquired absence of other specified parts of digestive tract: Secondary | ICD-10-CM | POA: Diagnosis not present

## 2015-07-02 DIAGNOSIS — Z809 Family history of malignant neoplasm, unspecified: Secondary | ICD-10-CM | POA: Diagnosis not present

## 2015-07-02 DIAGNOSIS — L0291 Cutaneous abscess, unspecified: Secondary | ICD-10-CM | POA: Diagnosis not present

## 2015-07-02 DIAGNOSIS — R627 Adult failure to thrive: Secondary | ICD-10-CM | POA: Diagnosis present

## 2015-07-02 DIAGNOSIS — E860 Dehydration: Secondary | ICD-10-CM | POA: Diagnosis present

## 2015-07-02 DIAGNOSIS — K750 Abscess of liver: Secondary | ICD-10-CM | POA: Diagnosis present

## 2015-07-02 DIAGNOSIS — Y839 Surgical procedure, unspecified as the cause of abnormal reaction of the patient, or of later complication, without mention of misadventure at the time of the procedure: Secondary | ICD-10-CM | POA: Diagnosis present

## 2015-07-02 DIAGNOSIS — E871 Hypo-osmolality and hyponatremia: Secondary | ICD-10-CM | POA: Diagnosis present

## 2015-07-02 DIAGNOSIS — Z9842 Cataract extraction status, left eye: Secondary | ICD-10-CM | POA: Diagnosis not present

## 2015-07-02 DIAGNOSIS — R251 Tremor, unspecified: Secondary | ICD-10-CM | POA: Diagnosis present

## 2015-07-02 DIAGNOSIS — E079 Disorder of thyroid, unspecified: Secondary | ICD-10-CM | POA: Diagnosis not present

## 2015-07-02 DIAGNOSIS — R278 Other lack of coordination: Secondary | ICD-10-CM | POA: Diagnosis not present

## 2015-07-02 DIAGNOSIS — Z87891 Personal history of nicotine dependence: Secondary | ICD-10-CM | POA: Diagnosis not present

## 2015-07-02 DIAGNOSIS — E039 Hypothyroidism, unspecified: Secondary | ICD-10-CM | POA: Diagnosis present

## 2015-07-02 DIAGNOSIS — Z7982 Long term (current) use of aspirin: Secondary | ICD-10-CM | POA: Diagnosis not present

## 2015-07-02 DIAGNOSIS — K567 Ileus, unspecified: Secondary | ICD-10-CM | POA: Diagnosis not present

## 2015-07-02 DIAGNOSIS — M6281 Muscle weakness (generalized): Secondary | ICD-10-CM | POA: Diagnosis not present

## 2015-07-02 DIAGNOSIS — M899 Disorder of bone, unspecified: Secondary | ICD-10-CM | POA: Diagnosis present

## 2015-07-02 DIAGNOSIS — I10 Essential (primary) hypertension: Secondary | ICD-10-CM | POA: Diagnosis not present

## 2015-07-02 DIAGNOSIS — Z79899 Other long term (current) drug therapy: Secondary | ICD-10-CM | POA: Diagnosis not present

## 2015-07-02 DIAGNOSIS — R188 Other ascites: Secondary | ICD-10-CM | POA: Diagnosis present

## 2015-07-02 DIAGNOSIS — Z9841 Cataract extraction status, right eye: Secondary | ICD-10-CM | POA: Diagnosis not present

## 2015-07-02 LAB — COMPREHENSIVE METABOLIC PANEL
ALBUMIN: 3 g/dL — AB (ref 3.5–5.0)
ALT: 17 U/L (ref 14–54)
AST: 24 U/L (ref 15–41)
Alkaline Phosphatase: 84 U/L (ref 38–126)
Anion gap: 7 (ref 5–15)
BUN: 41 mg/dL — AB (ref 6–20)
CHLORIDE: 97 mmol/L — AB (ref 101–111)
CO2: 28 mmol/L (ref 22–32)
CREATININE: 1.82 mg/dL — AB (ref 0.44–1.00)
Calcium: 11 mg/dL — ABNORMAL HIGH (ref 8.9–10.3)
GFR calc Af Amer: 28 mL/min — ABNORMAL LOW (ref >60)
GFR calc non Af Amer: 24 mL/min — ABNORMAL LOW (ref >60)
GLUCOSE: 144 mg/dL — AB (ref 65–99)
Potassium: 4 mmol/L (ref 3.5–5.1)
SODIUM: 132 mmol/L — AB (ref 135–145)
Total Bilirubin: 0.6 mg/dL (ref 0.3–1.2)
Total Protein: 6.9 g/dL (ref 6.5–8.1)

## 2015-07-02 LAB — I-STAT CG4 LACTIC ACID, ED: LACTIC ACID, VENOUS: 1.09 mmol/L (ref 0.5–2.0)

## 2015-07-02 LAB — URINE MICROSCOPIC-ADD ON

## 2015-07-02 LAB — CBC WITH DIFFERENTIAL/PLATELET
Basophils Absolute: 0.1 10*3/uL (ref 0.0–0.1)
Basophils Relative: 0 %
EOS ABS: 0.6 10*3/uL (ref 0.0–0.7)
EOS PCT: 4 %
HCT: 33.9 % — ABNORMAL LOW (ref 36.0–46.0)
HEMOGLOBIN: 11.4 g/dL — AB (ref 12.0–15.0)
LYMPHS ABS: 1.7 10*3/uL (ref 0.7–4.0)
Lymphocytes Relative: 11 %
MCH: 28.4 pg (ref 26.0–34.0)
MCHC: 33.6 g/dL (ref 30.0–36.0)
MCV: 84.5 fL (ref 78.0–100.0)
MONO ABS: 1.4 10*3/uL — AB (ref 0.1–1.0)
MONOS PCT: 10 %
NEUTROS PCT: 75 %
Neutro Abs: 10.8 10*3/uL — ABNORMAL HIGH (ref 1.7–7.7)
Platelets: 386 10*3/uL (ref 150–400)
RBC: 4.01 MIL/uL (ref 3.87–5.11)
RDW: 13.4 % (ref 11.5–15.5)
WBC: 14.5 10*3/uL — ABNORMAL HIGH (ref 4.0–10.5)

## 2015-07-02 LAB — URINALYSIS, ROUTINE W REFLEX MICROSCOPIC
Bilirubin Urine: NEGATIVE
Glucose, UA: NEGATIVE mg/dL
Ketones, ur: NEGATIVE mg/dL
NITRITE: NEGATIVE
PH: 6 (ref 5.0–8.0)
Protein, ur: 30 mg/dL — AB
SPECIFIC GRAVITY, URINE: 1.011 (ref 1.005–1.030)

## 2015-07-02 LAB — BASIC METABOLIC PANEL
ANION GAP: 9 (ref 5–15)
BUN: 35 mg/dL — ABNORMAL HIGH (ref 6–20)
CHLORIDE: 99 mmol/L — AB (ref 101–111)
CO2: 25 mmol/L (ref 22–32)
CREATININE: 1.58 mg/dL — AB (ref 0.44–1.00)
Calcium: 10.4 mg/dL — ABNORMAL HIGH (ref 8.9–10.3)
GFR calc non Af Amer: 28 mL/min — ABNORMAL LOW (ref >60)
GFR, EST AFRICAN AMERICAN: 33 mL/min — AB (ref >60)
Glucose, Bld: 122 mg/dL — ABNORMAL HIGH (ref 65–99)
POTASSIUM: 3.8 mmol/L (ref 3.5–5.1)
Sodium: 133 mmol/L — ABNORMAL LOW (ref 135–145)

## 2015-07-02 LAB — CBC
HEMATOCRIT: 35.2 % — AB (ref 36.0–46.0)
HEMOGLOBIN: 11.6 g/dL — AB (ref 12.0–15.0)
MCH: 28.4 pg (ref 26.0–34.0)
MCHC: 33 g/dL (ref 30.0–36.0)
MCV: 86.1 fL (ref 78.0–100.0)
Platelets: 356 10*3/uL (ref 150–400)
RBC: 4.09 MIL/uL (ref 3.87–5.11)
RDW: 13.7 % (ref 11.5–15.5)
WBC: 12.1 10*3/uL — ABNORMAL HIGH (ref 4.0–10.5)

## 2015-07-02 LAB — GLUCOSE, CAPILLARY: GLUCOSE-CAPILLARY: 169 mg/dL — AB (ref 65–99)

## 2015-07-02 MED ORDER — DEXTROSE 5 % IV SOLN
500.0000 mg | Freq: Three times a day (TID) | INTRAVENOUS | Status: DC
  Administered 2015-07-02 – 2015-07-04 (×7): 500 mg via INTRAVENOUS
  Filled 2015-07-02 (×7): qty 0.5

## 2015-07-02 MED ORDER — ONDANSETRON HCL 4 MG/2ML IJ SOLN
4.0000 mg | Freq: Four times a day (QID) | INTRAMUSCULAR | Status: DC | PRN
  Administered 2015-07-06 (×2): 4 mg via INTRAVENOUS
  Filled 2015-07-02 (×2): qty 2

## 2015-07-02 MED ORDER — HYDROMORPHONE HCL 1 MG/ML IJ SOLN: 0.5000 mg | INTRAMUSCULAR | Status: DC | PRN

## 2015-07-02 MED ORDER — LORAZEPAM 0.5 MG PO TABS
0.5000 mg | ORAL_TABLET | Freq: Four times a day (QID) | ORAL | Status: DC | PRN
  Administered 2015-07-02 – 2015-07-05 (×4): 0.5 mg via ORAL
  Filled 2015-07-02 (×5): qty 1

## 2015-07-02 MED ORDER — CETYLPYRIDINIUM CHLORIDE 0.05 % MT LIQD
7.0000 mL | Freq: Two times a day (BID) | OROMUCOSAL | Status: DC
  Administered 2015-07-02 – 2015-07-11 (×14): 7 mL via OROMUCOSAL

## 2015-07-02 MED ORDER — ALUM & MAG HYDROXIDE-SIMETH 200-200-20 MG/5ML PO SUSP
30.0000 mL | Freq: Four times a day (QID) | ORAL | Status: DC | PRN
  Administered 2015-07-03 – 2015-07-10 (×6): 30 mL via ORAL
  Filled 2015-07-02 (×6): qty 30

## 2015-07-02 MED ORDER — SODIUM CHLORIDE 0.9 % IV BOLUS (SEPSIS)
500.0000 mL | Freq: Once | INTRAVENOUS | Status: AC
  Administered 2015-07-02: 500 mL via INTRAVENOUS

## 2015-07-02 MED ORDER — LEVOTHYROXINE SODIUM 100 MCG PO TABS
100.0000 ug | ORAL_TABLET | Freq: Every day | ORAL | Status: DC
  Administered 2015-07-02 – 2015-07-06 (×5): 100 ug via ORAL
  Filled 2015-07-02 (×6): qty 1

## 2015-07-02 MED ORDER — ENOXAPARIN SODIUM 30 MG/0.3ML ~~LOC~~ SOLN
30.0000 mg | SUBCUTANEOUS | Status: DC
  Administered 2015-07-02 – 2015-07-04 (×3): 30 mg via SUBCUTANEOUS
  Filled 2015-07-02 (×3): qty 0.3

## 2015-07-02 MED ORDER — SODIUM CHLORIDE 0.9 % IV SOLN
1000.0000 mL | INTRAVENOUS | Status: DC
  Administered 2015-07-02 – 2015-07-05 (×9): 1000 mL via INTRAVENOUS

## 2015-07-02 MED ORDER — SODIUM CHLORIDE 0.9 % IV SOLN: INTRAVENOUS | Status: AC

## 2015-07-02 MED ORDER — ONDANSETRON HCL 4 MG PO TABS: 4.0000 mg | ORAL_TABLET | Freq: Four times a day (QID) | ORAL | Status: DC | PRN

## 2015-07-02 MED ORDER — ACETAMINOPHEN 650 MG RE SUPP: 650.0000 mg | Freq: Four times a day (QID) | RECTAL | Status: DC | PRN

## 2015-07-02 MED ORDER — ACETAMINOPHEN 325 MG PO TABS: 650.0000 mg | ORAL_TABLET | Freq: Four times a day (QID) | ORAL | Status: DC | PRN

## 2015-07-02 MED ORDER — ASPIRIN 81 MG PO CHEW
81.0000 mg | CHEWABLE_TABLET | Freq: Every day | ORAL | Status: DC
  Administered 2015-07-02 – 2015-07-11 (×10): 81 mg via ORAL
  Filled 2015-07-02 (×10): qty 1

## 2015-07-02 MED ORDER — DEXTROSE 5 % IV SOLN
1.0000 g | Freq: Once | INTRAVENOUS | Status: AC
  Administered 2015-07-02: 1 g via INTRAVENOUS
  Filled 2015-07-02: qty 1

## 2015-07-02 MED ORDER — OXYCODONE HCL 5 MG PO TABS
5.0000 mg | ORAL_TABLET | ORAL | Status: DC | PRN
  Administered 2015-07-02 – 2015-07-06 (×7): 5 mg via ORAL
  Filled 2015-07-02 (×7): qty 1

## 2015-07-02 NOTE — Clinical Social Work Placement (Signed)
   CLINICAL SOCIAL WORK PLACEMENT  NOTE  Date:  07/02/2015  Patient Details  Name: Destiny Velasquez MRN: [id number] Date of Birth: 1925-12-20  Clinical Social Work is seeking post-discharge placement for this patient at the [place] level of care (*CSW will initial, date and re-position this form in  chart as items are completed):  No   Patient/family provided with [place] Work Department's list of facilities offering this level of care within the geographic area requested by the patient (or if unable, by the patient's family).  Yes   Patient/family informed of their freedom to choose among providers that offer the needed level of care, that participate in Medicare, Medicaid or managed care program needed by the patient, have an available bed and are willing to accept the patient.  No   Patient/family informed of [facility]'s ownership interest in [facility] and [facility], as well as of the fact that they are under no obligation to receive care at these facilities.  PASRR submitted to EDS on       PASRR number received on       Existing PASRR number confirmed on 07/02/15     FL2 transmitted to all facilities in geographic area requested by pt/family on 07/02/15     FL2 transmitted to all facilities within larger geographic area on       Patient informed that his/her managed care company has contracts with or will negotiate with certain facilities, including the following:            Patient/family informed of bed offers received.  Patient chooses bed at       Physician recommends and patient chooses bed at      Patient to be transferred to   on  .  Patient to be transferred to facility by       Patient family notified on   of transfer.  Name of family member notified:        PHYSICIAN       Additional Comment:    _______________________________________________ Luretha Rued, [place] 07/02/2015, 4:11 PM

## 2015-07-02 NOTE — Plan of Care (Signed)
Problem: Safety: Goal: Ability to remain free from injury will improve Outcome: Progressing Patient weak. oob with assist. Bed alarm in use.

## 2015-07-02 NOTE — Progress Notes (Signed)
Patient ID: Destiny Velasquez, female   DOB: 12-27-1925, 81 y.o.   MRN: [id number]  [facility]  HD#: 2  Subjective: Patient in bed, alert, no complaints.  Mild confusion - doesn't know why she's here.  Had BM this AM.  Objective: Vital signs in last 24 hours: Temp:  [97.6 F (36.4 C)-98.1 F (36.7 C)] 97.6 F (36.4 C) (11/19 0550) Pulse Rate:  [68-79] 68 (11/19 0640) Resp:  [12-20] 16 (11/19 0550) BP: (110-130)/(51-67) 124/54 mmHg (11/19 0550) SpO2:  [91 %-98 %] 94 % (11/19 0640) Weight:  [62.869 kg (138 lb 9.6 oz)] 62.869 kg (138 lb 9.6 oz) (11/19 0550) Last BM Date: 06/30/15  Intake/Output from previous day: 11/18 0701 - 11/19 0700 In: 1409.6 [P.O.:120; I.V.:1289.6] Out: 0  Intake/Output this shift:    Physical Exam: HEENT - sclerae clear, mucous membranes moist Neck - soft Chest - clear bilaterally Cor - RRR Abdomen - soft without distension; BS present; surgical wounds dry and intact with steristrips in place  Lab Results:   Recent Labs  07/02/15 0026 07/02/15 0610  WBC 14.5* 12.1*  HGB 11.4* 11.6*  HCT 33.9* 35.2*  PLT 386 356   BMET  Recent Labs  07/02/15 0026 07/02/15 0610  NA 132* 133*  K 4.0 3.8  CL 97* 99*  CO2 28 25  GLUCOSE 144* 122*  BUN 41* 35*  CREATININE 1.82* 1.58*  CALCIUM 11.0* 10.4*   PT/INR No results for input(s): LABPROT, INR in the last 72 hours. Comprehensive Metabolic Panel:    Component Value Date/Time   NA 133* 07/02/2015 0610   NA 132* 07/02/2015 0026   NA 139 03/30/2015 0813   NA 139 09/20/2014 0816   K 3.8 07/02/2015 0610   K 4.0 07/02/2015 0026   CL 99* 07/02/2015 0610   CL 97* 07/02/2015 0026   CO2 25 07/02/2015 0610   CO2 28 07/02/2015 0026   BUN 35* 07/02/2015 0610   BUN 41* 07/02/2015 0026   BUN 18 03/30/2015 0813   BUN 18 09/20/2014 0816   CREATININE 1.58* 07/02/2015 0610   CREATININE 1.82* 07/02/2015 0026   GLUCOSE 122* 07/02/2015 0610   GLUCOSE 144* 07/02/2015 0026    GLUCOSE 119* 03/30/2015 0813   GLUCOSE 132* 09/20/2014 0816   CALCIUM 10.4* 07/02/2015 0610   CALCIUM 11.0* 07/02/2015 0026   AST 24 07/02/2015 0026   AST 22 06/23/2015 0850   ALT 17 07/02/2015 0026   ALT 45 06/23/2015 0850   ALKPHOS 84 07/02/2015 0026   ALKPHOS 90 06/23/2015 0850   BILITOT 0.6 07/02/2015 0026   BILITOT 1.1 06/23/2015 0850   BILITOT 0.3 09/20/2014 0816   PROT 6.9 07/02/2015 0026   PROT 6.6 06/23/2015 0850   PROT 6.6 09/20/2014 0816   PROT 6.8 03/17/2014 0807   ALBUMIN 3.0* 07/02/2015 0026   ALBUMIN 3.1* 06/23/2015 0850   ALBUMIN 3.9 09/20/2014 0816   ALBUMIN 3.9 03/17/2014 0807    Studies/Results: Dg Chest 2 View  07/02/2015  CLINICAL DATA:  Right upper quadrant pain. Cough with swallowing. Leukocytosis. EXAM: CHEST  2 VIEW COMPARISON:  PET-CT 05/17/2015 FINDINGS: Heart at the upper limits of normal in size, atherosclerosis of the thoracic aorta. Linear opacity in the right perihilar lung consistent with atelectasis. No consolidation to suggest pneumonia. Pulmonary vasculature is normal. No consolidation, pleural effusion, or pneumothorax. No acute osseous abnormalities are seen. IMPRESSION: Linear right perihilar atelectasis.  No pneumonia. Electronically Signed   By: Fonnie Birkenhead.D.  On: 07/02/2015 00:47    Anti-infectives: Anti-infectives    Start     Dose/Rate Route Frequency Ordered Stop   07/02/15 1200  aztreonam (AZACTAM) 500 mg in dextrose 5 % 50 mL IVPB     500 mg 100 mL/hr over 30 Minutes Intravenous Every 8 hours 07/02/15 0545     07/02/15 0400  aztreonam (AZACTAM) 1 g in dextrose 5 % 50 mL IVPB     1 g 100 mL/hr over 30 Minutes Intravenous  Once 07/02/15 0346 07/02/15 0418      Assessment & Plans: Status post partial hepatectomy  No apparent surgical complications  Wounds healing uneventfully  Will notify Dr. Barry Dienes of admission Pyelonephritis  Management per medical team  Earnstine Regal, MD, [facility] Office: [place] 07/02/2015

## 2015-07-02 NOTE — Progress Notes (Signed)
ANTIBIOTIC CONSULT NOTE - INITIAL  Pharmacy Consult for Aztreonam Indication: UTI   No Known Allergies  Patient Measurements:    Vital Signs: Temp: 98.1 F (36.7 C) (11/18 2323) Temp Source: Oral (11/18 2323) BP: 122/67 mmHg (11/19 0430) Pulse Rate: 77 (11/19 0430) Intake/Output from previous day: 11/18 0701 - 11/19 0700 In: 500 [I.V.:500] Out: -  Intake/Output from this shift: Total I/O In: 500 [I.V.:500] Out: -   Labs:  Recent Labs  07/02/15 0026  WBC 14.5*  HGB 11.4*  PLT 386  CREATININE 1.82*   Estimated Creatinine Clearance: 18.8 mL/min (by C-G formula based on Cr of 1.82). No results for input(s): VANCOTROUGH, VANCOPEAK, VANCORANDOM, GENTTROUGH, GENTPEAK, GENTRANDOM, TOBRATROUGH, TOBRAPEAK, TOBRARND, AMIKACINPEAK, AMIKACINTROU, AMIKACIN in the last 72 hours.   Microbiology: Recent Results (from the past 720 hour(s))  MRSA PCR Screening     Status: None   Collection Time: 06/14/15  6:41 PM  Result Value Ref Range Status   MRSA by PCR NEGATIVE NEGATIVE Final    Comment:        The GeneXpert MRSA Assay (FDA approved for NASAL specimens only), is one component of a comprehensive MRSA colonization surveillance program. It is not intended to diagnose MRSA infection nor to guide or monitor treatment for MRSA infections.     Medical History: Past Medical History  Diagnosis Date  . Hypertension   . Hyperlipidemia   . Thyroid disease   . Subacute delirium   . Essential and other specified forms of tremor   . Unspecified hypothyroidism   . Diverticulosis of colon (without mention of hemorrhage)   . Unspecified constipation   . Anal and rectal polyp   . Disorder of bone and cartilage, unspecified   . Pneumonia X 1  . GERD (gastroesophageal reflux disease)   . Malignant neoplasm of breast (female), unspecified site   . Breast cancer, right breast ([place]) 1971    "never had chemo or radiation"  . Liver cancer ([place]) 2016  . Diverticulitis of large  intestine without perforation or abscess without bleeding     Medications:  Scheduled:   Infusions:  . sodium chloride 1,000 mL (07/02/15 0018)   Assessment:  82 yr female with h/o breast and liver cancer.  Recent hepatectomy and diagnosed with UTI at the same time.  Completed 7 day course of Levaquin.  Received Aztreonam 1gm IV x 1 in ED  Pharmacy consulted to dose Aztreonam for treatment of UTI   11/19 >>Aztreonam >>   11/19 blood: 11/19 urine:  Goal of Therapy:  Eradication of infection  Plan:  Follow up culture results  Aztreonam '500mg'$  IV q8h  Lyfe Monger, Toribio Harbour, PharmD 07/02/2015,5:39 AM

## 2015-07-02 NOTE — NC FL2 (Signed)
[place] LEVEL OF CARE SCREENING TOOL     IDENTIFICATION  Patient Name: Destiny Velasquez Birthdate: 06/01/1926 Sex: female Admission Date (Current Location): 07/01/2015  [facility] and [place] Number: Herbalist and Address:  [facility],  [place] [address], [place]      Provider Number: [id number]  Attending Physician Name and Address:  Cristal Ford, DO  Relative Name and Phone Number:       Current Level of Care: Hospital Recommended Level of Care: [place] Prior Approval Number:    Date Approved/Denied:   PASRR Number: [id number]  Discharge Plan: SNF    Current Diagnoses: Patient Active Problem List   Diagnosis Date Noted  . Pyelonephritis 07/02/2015  . Dysphagia 07/02/2015  . Leukocytosis 07/02/2015  . Acute-on-chronic kidney injury ([place]) 07/02/2015  . Hyponatremia 07/02/2015  . Urinary tract infection, site not specified 07/02/2015  . Subacute delirium   . GERD (gastroesophageal reflux disease)   . Adenocarcinoma of liver s/p partial right hepatic lobectomy 06/14/2015 06/14/2015  . At risk for colon cancer 05/17/2015  . Unintentional weight loss 05/06/2015  . Hypothyroidism 05/03/2015  . Essential hypertension 05/03/2015  . Chronic constipation 04/15/2014  . Arthralgia of right knee 01/26/2013  . Arthralgia of right lower leg 01/26/2013  . Hyperlipidemia   . Thyroid disease   . Memory loss   . Disorder of bone and cartilage, unspecified     Orientation ACTIVITIES/SOCIAL BLADDER RESPIRATION    Self, Time, Situation, Place  Active Incontinent Normal  BEHAVIORAL SYMPTOMS/MOOD NEUROLOGICAL BOWEL NUTRITION STATUS   (no behaviors)   Continent  (NPO)  PHYSICIAN VISITS COMMUNICATION OF NEEDS Height & Weight Skin    Verbally '5\' 3"'$  (160 cm) 138 lbs. Surgical wounds          AMBULATORY STATUS RESPIRATION     (limited assist) Normal      Personal Care Assistance Level of Assistance  Bathing,  Feeding, Dressing Bathing Assistance: Limited assistance Feeding assistance: Limited assistance Dressing Assistance: Limited assistance      Functional Limitations Info  Sight, Hearing, Speech Sight Info: Adequate Hearing Info: Adequate Speech Info: Adequate       SPECIAL CARE FACTORS FREQUENCY  PT (By licensed PT), OT (By licensed OT)     PT Frequency: 5 x wk OT Frequency: 5 x wk           Additional Factors Info  Code Status Code Status Info: Full Code Allergies Info: NKA           Current Medications (07/02/2015): Current Facility-Administered Medications  Medication Dose Route Frequency Provider Last Rate Last Dose  . 0.9 %  sodium chloride infusion  1,000 mL Intravenous Continuous Hannah Muthersbaugh, PA-C   Stopped at 07/02/15 [phone]  . 0.9 %  sodium chloride infusion   Intravenous Continuous Theressa Millard, MD 75 mL/hr at 07/02/15 [phone]    . acetaminophen (TYLENOL) tablet 650 mg  650 mg Oral Q6H PRN Theressa Millard, MD       Or  . acetaminophen (TYLENOL) suppository 650 mg  650 mg Rectal Q6H PRN Theressa Millard, MD      . alum & mag hydroxide-simeth (MAALOX/MYLANTA) 200-200-20 MG/5ML suspension 30 mL  30 mL Oral Q6H PRN Theressa Millard, MD      . antiseptic oral rinse (CPC / CETYLPYRIDINIUM CHLORIDE 0.05%) solution 7 mL  7 mL Mouth Rinse BID Maryann Mikhail, DO   7 mL at 07/02/15 1030  . aspirin  chewable tablet 81 mg  81 mg Oral Daily Theressa Millard, MD   81 mg at 07/02/15 1054  . aztreonam (AZACTAM) 500 mg in dextrose 5 % 50 mL IVPB  500 mg Intravenous Q8H Leann T Poindexter, RPH      . enoxaparin (LOVENOX) injection 30 mg  30 mg Subcutaneous Q24H Theressa Millard, MD   30 mg at 07/02/15 1054  . HYDROmorphone (DILAUDID) injection 0.5-1 mg  0.5-1 mg Intravenous Q3H PRN Theressa Millard, MD      . levothyroxine (SYNTHROID, LEVOTHROID) tablet 100 mcg  100 mcg Oral QAC breakfast Theressa Millard, MD   100 mcg at 07/02/15 1054  . ondansetron  (ZOFRAN) tablet 4 mg  4 mg Oral Q6H PRN Theressa Millard, MD       Or  . ondansetron (ZOFRAN) injection 4 mg  4 mg Intravenous Q6H PRN Theressa Millard, MD      . oxyCODONE (Oxy IR/ROXICODONE) immediate release tablet 5 mg  5 mg Oral Q4H PRN Theressa Millard, MD       Do not use this list as official medication orders. Please verify with discharge summary.  Discharge Medications:   Medication List    ASK your doctor about these medications        acetaminophen 325 MG tablet  Commonly known as:  TYLENOL  Take 1-2 tablets (325-650 mg total) by mouth every 6 (six) hours as needed for fever, headache, mild pain or moderate pain.     alum & mag hydroxide-simeth 200-200-20 MG/5ML suspension  Commonly known as:  MAALOX/MYLANTA  Take 30 mLs by mouth every 6 (six) hours as needed for indigestion or heartburn (or bloating).     aspirin 81 MG tablet  Take 81 mg by mouth daily.     estrogens (conjugated) 0.3 MG tablet  Commonly known as:  PREMARIN  Take one tablet every other day for one month     feeding supplement Liqd  Take 1 Container by mouth 3 (three) times daily between meals.     HYDROcodone-acetaminophen 5-325 MG tablet  Commonly known as:  NORCO/VICODIN  Take 1 tablet by mouth every 6 (six) hours as needed for moderate pain.     ibuprofen 400 MG tablet  Commonly known as:  ADVIL,MOTRIN  Take 1-2 tablets (400-800 mg total) by mouth every 12 (twelve) hours as needed for fever or headache.     levofloxacin 500 MG tablet  Commonly known as:  LEVAQUIN  Take 1 tablet (500 mg total) by mouth daily.     levothyroxine 100 MCG tablet  Commonly known as:  SYNTHROID, LEVOTHROID  TAKE ONE TABLET BY MOUTH 30 MINUTES BEFORE BREAKFAST EVERY MORNING. SEPARATE FROM OTHER MEDICATIONS FOR THYROID.     LORazepam 0.5 MG tablet  Commonly known as:  ATIVAN  Take 1 tablet (0.5 mg total) by mouth every 6 (six) hours as needed for anxiety.     losartan 50 MG tablet  Commonly known as:   COZAAR  Take one tablet by mouth once daily for blood pressure     ondansetron 4 MG tablet  Commonly known as:  ZOFRAN  Take 1 tablet (4 mg total) by mouth every 8 (eight) hours as needed for nausea or vomiting.     OVER THE COUNTER MEDICATION  Take 1 Container by mouth 3 (three) times daily. Med pass     polyethylene glycol packet  Commonly known as:  MIRALAX / GLYCOLAX  Take 17 g by  mouth daily.     PROBIOTIC DAILY PO  Take 1 tablet by mouth daily.     shark liver oil-cocoa butter 0.25-3-85.5 % suppository  Commonly known as:  PREPARATION H  Place 1 suppository rectally as needed for hemorrhoids.     simethicone 125 MG chewable tablet  Commonly known as:  MYLICON  Chew 517 mg by mouth 3 (three) times daily.     simethicone 80 MG chewable tablet  Commonly known as:  MYLICON  Chew 1 tablet (80 mg total) by mouth 4 (four) times daily as needed for flatulence.     simvastatin 40 MG tablet  Commonly known as:  ZOCOR  Take 1 tablet (40 mg total) by mouth every other day.     spironolactone-hydrochlorothiazide 25-25 MG tablet  Commonly known as:  ALDACTAZIDE  TAKE 1/2 TABLET BY MOUTH ONCE DAILY     traMADol 50 MG tablet  Commonly known as:  ULTRAM  Take 1-2 tablets (50-100 mg total) by mouth every 6 (six) hours as needed for moderate pain or severe pain.        Relevant Imaging Results:  Relevant Lab Results:  Recent Labs    Additional Information SS# [id number]  Beverely Suen, Randall An, LCSW

## 2015-07-02 NOTE — Evaluation (Signed)
Clinical/Bedside Swallow Evaluation Patient Details  Name: Destiny Velasquez MRN: [id number] Date of Birth: 1926/04/27  Today's Date: 07/02/2015 Time: SLP Start Time (ACUTE ONLY): 67 SLP Stop Time (ACUTE ONLY): 1500 SLP Time Calculation (min) (ACUTE ONLY): 24 min  Past Medical History:  Past Medical History  Diagnosis Date  . Hypertension   . Hyperlipidemia   . Thyroid disease   . Subacute delirium   . Essential and other specified forms of tremor   . Unspecified hypothyroidism   . Diverticulosis of colon (without mention of hemorrhage)   . Unspecified constipation   . Anal and rectal polyp   . Disorder of bone and cartilage, unspecified   . Pneumonia X 1  . GERD (gastroesophageal reflux disease)   . Malignant neoplasm of breast (female), unspecified site   . Breast cancer, right breast ([place]) 1971    "never had chemo or radiation"  . Liver cancer ([place]) 2016  . Diverticulitis of large intestine without perforation or abscess without bleeding    Past Surgical History:  Past Surgical History  Procedure Laterality Date  . Appendectomy    . Abdominal hysterectomy    . Tonsillectomy and adenoidectomy  1935  . Cholecystectomy    . Breast biopsy Right 1971  . Mastectomy Right 1971  . Tubal ligation    . Cataract extraction w/ intraocular lens  implant, bilateral Bilateral   . Rectal polypectomy  early 2000's  . Laparoscopy N/A 06/14/2015    Procedure: LAPAROSCOPY DIAGNOSTIC;  Surgeon: [name], MD;  Location: [facility];  Service: General;  Laterality: N/A;  . Open partial hepatectomy [83] N/A 06/14/2015    Procedure:  OPEN PARTIAL RIGHT HEPATECTOMY;  Surgeon: [name], MD;  Location: [facility];  Service: General;  Laterality: N/A;  converted to open @ 1447  . Liver biopsy  06/14/2015    Procedure: LIVER BIOPSY;  Surgeon: [name], MD;  Location: [facility];  Service: General;;   HPI:  Destiny Velasquez is a 81 y.o. female with history of Liver Adenocarcinoma S/P resection on 06/15/2015  currently in the [facility] for Rehab Rx who was sent to the ED after laboratory results revealed a persistent Elevated WBC despite 10 days of antibiotic rx for a UTI with Levaquin. Per MD notes, daugher reports difficulty with swallowing medicines for 2 to 3 days   Assessment / Plan / Recommendation Clinical Impression  Pt presents with mild oropharygeal dysphagia. Daughter and pt report acute difficulty with medicines when administered whole in puree at Rehab facility. Noted mild lingual discoordination and prolonged oral transit with thin and puree consistencies and observed multiple swallows. Oral motor exam unremarkable. No overt signs or symptoms of reduced airway protection this date. Recommend regular thin diet implementation. Pt requests medicines crushed in puree. ST to follow up x1 to ensure diet tolerance.     Aspiration Risk  Mild aspiration risk    Diet Recommendation   Regular, thin  Medication Administration: Crushed with puree    Other  Recommendations Oral Care Recommendations: Oral care BID   Follow up Recommendations       Frequency and Duration min 1 x/week  1 week       Swallow Study   General Date of Onset: 07/01/15 HPI: Destiny Velasquez is a 81 y.o. female with history of Liver Adenocarcinoma S/P resection on 06/15/2015 currently in the [facility] for Rehab Rx who was sent to the ED after laboratory results revealed a persistent Elevated WBC despite 10 days of antibiotic  rx for a UTI with Levaquin. Per MD notes, daugher reports difficulty with swallowing medicines for 2 to 3 days Type of Study: Bedside Swallow Evaluation Diet Prior to this Study: NPO Temperature Spikes Noted: No Respiratory Status: Room air History of Recent Intubation: No Behavior/Cognition: Alert;Cooperative Oral Cavity Assessment: Erythema;Dry Oral Care Completed by SLP: Recent completion by staff Oral Cavity - Dentition: Adequate natural dentition Vision: Functional for  self-feeding Self-Feeding Abilities: Able to feed self Patient Positioning: Upright in chair Baseline Vocal Quality: Low vocal intensity Volitional Cough: Weak Volitional Swallow: Able to elicit    Oral/Motor/Sensory Function Overall Oral Motor/Sensory Function: Within functional limits   Ice Chips Ice chips: Impaired Presentation: Spoon Oral Phase Impairments: Reduced lingual movement/coordination Oral Phase Functional Implications: Prolonged oral transit Pharyngeal Phase Impairments: Suspected delayed Swallow;Multiple swallows   Thin Liquid Thin Liquid: Impaired Presentation: Cup;Straw Oral Phase Impairments: Reduced lingual movement/coordination Oral Phase Functional Implications: Prolonged oral transit Pharyngeal  Phase Impairments: Suspected delayed Swallow    Nectar Thick Nectar Thick Liquid: Not tested   Honey Thick Honey Thick Liquid: Not tested   Puree Puree: Within functional limits   Solid Solid: Within functional limits      [name] MA, CCC-SLP Acute Care Speech Language Pathologist    [name] E 07/02/2015,3:12 PM

## 2015-07-02 NOTE — Progress Notes (Signed)
   [facility]                                                                              Patient Demographics  Destiny Velasquez, is a 81 y.o. female, DOB - 1926-01-05, VUD:[id number]  Admit date - 07/01/2015   Admitting Physician Theressa Millard, MD  Outpatient Primary MD for the patient is [name], [name], DO  LOS - 0   Chief Complaint  Patient presents with  . Elevated WBC       HPI on 07/02/2015 by Dr. Jana Hakim Destiny Velasquez is a 82 y.o. female with history of Liver Adenocarcinoma S/P resection on 06/15/2015 currently in the [facility] for Rehab Rx who was sent to the ED after laboratory results revealed a persistent Elevated WBC despite 10 days of antibiotic rx for a UTI with Levaquin. She was evaluated in the ED and also found to have an elevated BUN/Cr of 41/1.82. A repeat Urine Culture was sent and she was placed on IV Azactam and referred for admission.   One of her daughters is at bedside and reports that she has observed her to have dysphagia x 2 -3 days, and difficulty swallowing her medications.  Assessment & Plan   Patient admitted earlier this morning by Dr. Jana Hakim. Please see full H&P for details. Agree with current assessment and plan.   Urinary tract infection with leukocytosis -Leukocytosis improving -UA on 06/22/2015 was positive for infection however no urine culture done at that time.  Patient was placed on Levaquin -UA 07/02/2015 shows WBC TNTC, large leukocytes, many bacteria -Urine culture currently pending -Blood cultures pending -Continue aztreonam  Acute on chronic kidney injury, stage III -Likely secondary to dehydration -Creatinine on admission 1.82, currently 1.58 -Continue to monitor BMP -Continue IVF  Dysphagia -Speech therapy consultation appreciated -Currently NPO  except for medications  Hyponatremia -Sodium currently 133, continue monitor BMP -TSH 03/30/2015 1.6 -Continue  IVF  Hypothyroidism -Continue Synthroid -TSH 03/30/2015 1.6  Essential hypertension -Blood pressure currently stable -Losartan and prolactin/HCTZ held  Hyperlipidemia -Continue statin when patient passed a swallow eval  Adenocarcinoma liver -Status post hepatic lobectomy 06/14/2015 -Stable   Code Status: Full  Family Communication: None at bedside  Disposition Plan: Admitted  Time Spent in minutes   30 minutes  Procedures  None  Consults   none  DVT Prophylaxis  Lovenox  Destiny Velasquez D.O. on 07/02/2015 at 12:20 PM  Between 7am to 7pm - Pager - [phone]  After 7pm go to [email or web address] - password [email or web address]  And look for the night coverage person covering for me after hours  [facility] Office  [phone]

## 2015-07-02 NOTE — Clinical Social Work Note (Signed)
Clinical Social Work Assessment  Patient Details  Name: Destiny Velasquez MRN: [id number] Date of Birth: 06/25/1926  Date of referral:  07/02/15               Reason for consult:  Facility Placement, Discharge Planning                Permission sought to share information with:  Facility Art therapist granted to share information::  Yes, Verbal Permission Granted  Name::        Agency::     Relationship::     Contact Information:     Housing/Transportation Living arrangements for the past 2 months:  [place], [place] of Information:  Patient, Adult Children Patient Interpreter Needed:  None Criminal Activity/Legal Involvement Pertinent to Current Situation/Hospitalization:  No - Comment as needed Significant Relationships:  Adult Children, Spouse Lives with:  Facility Resident Do you feel safe going back to the place where you live?  Yes Need for family participation in patient care:  Yes (Comment)  Care giving concerns:  Family is concerned that pt won't be able to return to [facility] at d/c.   Social Worker assessment / plan:  Pt hospitalized on 07/01/15 with pyelonephritis / UTI. CSW consulted to assist with d/c planning. PN reviewed. Pt was d/c from [place] to [facility] on 06/23/15. CSW met with pt and spoke with daughter, Destiny Velasquez, at pt's request. Pt / family would like pt to return to [facility] at d/c. Family is hoping to place pt and her spouse at [facility] on 07/12/15. Destiny Velasquez has contacted Diane Scientist, water quality ) at [facility] and is trying to hold next available bed for pt at d/c. CSW has contacted [facility] and clinicals have been sent. New SNF search also initiated in case [facility] has no openings at d/c. New bed offers are pending and will be reviewed with pt / family when available.  Employment status:  Retired Forensic scientist:  Medicare PT Recommendations:  Not assessed at this time [place] / Referral to  community resources:  [place]  Patient/Family's Response to care:  Pt / family feel continued SNF placement is needed.  Patient/Family's Understanding of and Emotional Response to Diagnosis, Current Treatment, and Prognosis:  Pt reports she is disappointed she is back in the hospital so soon after leaving. Daughter, Destiny Velasquez, is concerned that [facility] won't have an opening for pt at d/c. " My mother will have better continuity of care if she can return to [facility]. "   Emotional Assessment Appearance:  Appears stated age Attitude/Demeanor/Rapport:  Other (cooperative) Affect (typically observed):  Calm, Pleasant, Appropriate Orientation:  Oriented to Self, Oriented to Place, Oriented to  Time, Oriented to Situation Alcohol / Substance use:  Not Applicable Psych involvement (Current and /or in the community):  No (Comment)  Discharge Needs  Concerns to be addressed:  Discharge Planning Concerns Readmission within the last 30 days:  No Current discharge risk:  None Barriers to Discharge:  No Barriers Identified   Destiny Velasquez, [place] 07/02/2015, 3:59 PM

## 2015-07-02 NOTE — H&P (Addendum)
[facility] Admission History and Physical       Amaris Delafuente HUD:[id number] DOB: 04/06/1926 DOA: 07/01/2015  Referring physician: EDP PCP: Hollace Kinnier, DO  Specialists:   Chief Complaint: Elevated White Count  HPI: Destiny Velasquez is a 81 y.o. female with  history of Liver Adenocarcinoma S/P resection on 06/15/2015 currently in the [facility] for Rehab Rx who was sent to the ED after laboratory results revealed a persistent Elevated WBC despite 10 days of antibiotic rx for a UTI with Levaquin.   She was evaluated in the ED and also found to have an elevated BUN/Cr of 41/1.82. A repeat Urine Culture was sent and she was placed on IV Azactam and referred for admission.     One of her daughters is at bedside and reports that she has observed her to have dysphagia x 2 -3 days, and difficulty swallowing her medications.           Review of Systems:  Constitutional: No Weight Loss, No Weight Gain, Night Sweats, Fevers, Chills, Dizziness, Light Headedness, Fatigue,  +Generalized Weakness HEENT: No Headaches,+Difficulty Swallowing,Tooth/Dental Problems,Sore Throat,  No Sneezing, Rhinitis, Ear Ache, Nasal Congestion, or Post Nasal Drip,  Cardio-vascular:  No Chest pain, Orthopnea, PND, Edema in Lower Extremities, Anasarca, Dizziness, Palpitations  Resp: No Dyspnea, No DOE, No Productive Cough, No Non-Productive Cough, No Hemoptysis, No Wheezing.    GI: No Heartburn, Indigestion, Abdominal Pain, Nausea, Vomiting, Diarrhea, Constipation, Hematemesis, Hematochezia, Melena, Change in Bowel Habits,  Loss of Appetite  GU: No Dysuria, No Change in Color of Urine, No Urgency or Urinary Frequency, No Flank pain.  Musculoskeletal: No Joint Pain or Swelling, No Decreased Range of Motion, No Back Pain.  Neurologic: No Syncope, No Seizures, Muscle Weakness, Paresthesia, Vision Disturbance or Loss, No Diplopia, No Vertigo, No Difficulty Walking,  Skin: No Rash or Lesions. Psych: No Change in Mood or  Affect, No Depression or Anxiety, No Memory loss, No Confusion, or Hallucinations   Past Medical History  Diagnosis Date  . Hypertension   . Hyperlipidemia   . Thyroid disease   . Subacute delirium   . Essential and other specified forms of tremor   . Unspecified hypothyroidism   . Diverticulosis of colon (without mention of hemorrhage)   . Unspecified constipation   . Anal and rectal polyp   . Disorder of bone and cartilage, unspecified   . Pneumonia X 1  . GERD (gastroesophageal reflux disease)   . Malignant neoplasm of breast (female), unspecified site   . Breast cancer, right breast ([place]) 1971    "never had chemo or radiation"  . Liver cancer ([place]) 2016  . Diverticulitis of large intestine without perforation or abscess without bleeding      Past Surgical History  Procedure Laterality Date  . Appendectomy    . Abdominal hysterectomy    . Tonsillectomy and adenoidectomy  1935  . Cholecystectomy    . Breast biopsy Right 1971  . Mastectomy Right 1971  . Tubal ligation    . Cataract extraction w/ intraocular lens  implant, bilateral Bilateral   . Rectal polypectomy  early 2000's  . Laparoscopy N/A 06/14/2015    Procedure: LAPAROSCOPY DIAGNOSTIC;  Surgeon: [name], MD;  Location: [facility];  Service: General;  Laterality: N/A;  . Open partial hepatectomy [83] N/A 06/14/2015    Procedure:  OPEN PARTIAL RIGHT HEPATECTOMY;  Surgeon: [name], MD;  Location: [facility];  Service: General;  Laterality: N/A;  converted to open @ 1447  .  Liver biopsy  06/14/2015    Procedure: LIVER BIOPSY;  Surgeon: [name], MD;  Location: [facility];  Service: General;;      Prior to Admission medications   Medication Sig Start Date End Date Taking? Authorizing Provider  acetaminophen (TYLENOL) 325 MG tablet Take 1-2 tablets (325-650 mg total) by mouth every 6 (six) hours as needed for fever, headache, mild pain or moderate pain. 06/22/15  Yes [name], MD  alum & mag hydroxide-simeth  (MAALOX/MYLANTA) 200-200-20 MG/5ML suspension Take 30 mLs by mouth every 6 (six) hours as needed for indigestion or heartburn (or bloating). 06/22/15  Yes [name], MD  aspirin 81 MG tablet Take 81 mg by mouth daily.   Yes Historical Provider, MD  HYDROcodone-acetaminophen (NORCO/VICODIN) 5-325 MG tablet Take 1 tablet by mouth every 6 (six) hours as needed for moderate pain. 06/22/15  Yes [name], MD  ibuprofen (ADVIL,MOTRIN) 400 MG tablet Take 1-2 tablets (400-800 mg total) by mouth every 12 (twelve) hours as needed for fever or headache. 06/22/15  Yes [name], MD  levothyroxine (SYNTHROID, LEVOTHROID) 100 MCG tablet TAKE ONE TABLET BY MOUTH 30 MINUTES BEFORE BREAKFAST EVERY MORNING. SEPARATE FROM OTHER MEDICATIONS FOR THYROID. 12/20/14  Yes [name] L [name], DO  LORazepam (ATIVAN) 0.5 MG tablet Take 1 tablet (0.5 mg total) by mouth every 6 (six) hours as needed for anxiety. Patient taking differently: Take 0.5 mg by mouth every 6 (six) hours as needed for anxiety. Pt does take in prn through out the day but takes it every night at 9pm 05/09/15  Yes [name] L [name], DO  losartan (COZAAR) 50 MG tablet Take one tablet by mouth once daily for blood pressure Patient taking differently: Take 50 mg by mouth daily. Take one tablet by mouth once daily for blood pressure 02/28/15  Yes [name] L [name], DO  ondansetron (ZOFRAN) 4 MG tablet Take 1 tablet (4 mg total) by mouth every 8 (eight) hours as needed for nausea or vomiting. 05/09/15  Yes [name] L [name], DO  OVER THE COUNTER MEDICATION Take 1 Container by mouth 3 (three) times daily. Med pass   Yes Historical Provider, MD  polyethylene glycol (MIRALAX / GLYCOLAX) packet Take 17 g by mouth daily.    Yes Historical Provider, MD  Probiotic Product (PROBIOTIC DAILY PO) Take 1 tablet by mouth daily.    Yes Historical Provider, MD  simethicone (MYLICON) 062 MG chewable tablet Chew 125 mg by mouth 3 (three) times daily.   Yes Historical Provider, MD    spironolactone-hydrochlorothiazide (ALDACTAZIDE) 25-25 MG per tablet TAKE 1/2 TABLET BY MOUTH ONCE DAILY 04/11/15  Yes [name] L [name], DO  traMADol (ULTRAM) 50 MG tablet Take 1-2 tablets (50-100 mg total) by mouth every 6 (six) hours as needed for moderate pain or severe pain. 06/22/15  Yes [name], MD  estrogens, conjugated, (PREMARIN) 0.3 MG tablet Take one tablet every other day for one month Patient not taking: Reported on 07/01/2015 03/18/14   [name] L [name], DO  feeding supplement (BOOST / RESOURCE BREEZE) LIQD Take 1 Container by mouth 3 (three) times daily between meals. Patient not taking: Reported on 06/08/2015 05/06/15   Nishant Dhungel, MD  levofloxacin (LEVAQUIN) 500 MG tablet Take 1 tablet (500 mg total) by mouth daily. Patient not taking: Reported on 07/01/2015 06/23/15   [name], MD  shark liver oil-cocoa butter (PREPARATION H) 0.25-3-85.5 % suppository Place 1 suppository rectally as needed for hemorrhoids.    Historical Provider, MD  simethicone (MYLICON) 80  MG chewable tablet Chew 1 tablet (80 mg total) by mouth 4 (four) times daily as needed for flatulence. Patient not taking: Reported on 07/01/2015 06/22/15   [name], MD  simvastatin (ZOCOR) 40 MG tablet Take 1 tablet (40 mg total) by mouth every other day. Patient not taking: Reported on 07/01/2015 04/14/15   Gayland Curry, DO     No Known Allergies    Social History:  reports that she quit smoking about 2 years ago. Her smoking use included Cigarettes. She has a 20 pack-year smoking history. She has never used smokeless tobacco. She reports that she drinks about 3.0 oz of alcohol per week. She reports that she does not use illicit drugs.     Family History  Problem Relation Age of Onset  . Cancer Mother   . Hypertension Father        Physical Exam:  GEN:  Pleasant Elderly Obese  81 y.o. Caucasian female examined and in no acute distress; cooperative with exam Filed Vitals:   07/02/15 0145 07/02/15  0300 07/02/15 0330 07/02/15 0400  BP:  116/58 130/63 110/56  Pulse: 77 75 79 73  Temp:      TempSrc:      Resp: '14 13 18 12  '$ SpO2: 95% 91% 97% 94%   Blood pressure 110/56, pulse 73, temperature 98.1 F (36.7 C), temperature source Oral, resp. rate 12, SpO2 94 %. PSYCH: SHe is alert and oriented x4; does not appear anxious does not appear depressed; affect is normal HEENT: Normocephalic and Atraumatic, Mucous membranes pink; PERRLA; EOM intact; Fundi:  Benign;  No scleral icterus, Nares: Patent, Oropharynx: Clear, Edentulous,    Neck:  FROM, No Cervical Lymphadenopathy nor Thyromegaly or Carotid Bruit; No JVD; Breasts:: Not examined CHEST WALL: No tenderness CHEST: Normal respiration, clear to auscultation bilaterally HEART: Regular rate and rhythm; no murmurs rubs or gallops BACK: No kyphosis or scoliosis; No CVA tenderness ABDOMEN: Positive Bowel Sounds,Obese,+ Oblique Surgical Scar Healing with Steri-Strips still attached, no Erythema,  ABD: Soft Non-Tender, No Rebound or Guarding; No Masses, No Organomegaly, No Pannus; No Intertriginous candida. Rectal Exam: Not done EXTREMITIES: No Cyanosis, Clubbing, or Edema; No Ulcerations. Genitalia: not examined PULSES: 2+ and symmetric SKIN: Normal hydration no rash or ulceration CNS:  Alert and Oriented x 4, No Focal Deficits Vascular: pulses palpable throughout    Labs on Admission:  Basic Metabolic Panel:  Recent Labs Lab 07/02/15 0026  NA 132*  K 4.0  CL 97*  CO2 28  GLUCOSE 144*  BUN 41*  CREATININE 1.82*  CALCIUM 11.0*   Liver Function Tests:  Recent Labs Lab 07/02/15 0026  AST 24  ALT 17  ALKPHOS 84  BILITOT 0.6  PROT 6.9  ALBUMIN 3.0*   No results for input(s): LIPASE, AMYLASE in the last 168 hours. No results for input(s): AMMONIA in the last 168 hours. CBC:  Recent Labs Lab 07/02/15 0026  WBC 14.5*  NEUTROABS 10.8*  HGB 11.4*  HCT 33.9*  MCV 84.5  PLT 386   Cardiac Enzymes: No results for  input(s): CKTOTAL, CKMB, CKMBINDEX, TROPONINI in the last 168 hours.  BNP (last 3 results) No results for input(s): BNP in the last 8760 hours.  ProBNP (last 3 results) No results for input(s): PROBNP in the last 8760 hours.  CBG: No results for input(s): GLUCAP in the last 168 hours.  Radiological Exams on Admission: Dg Chest 2 View  07/02/2015  CLINICAL DATA:  Right upper quadrant pain. Cough with swallowing.  Leukocytosis. EXAM: CHEST  2 VIEW COMPARISON:  PET-CT 05/17/2015 FINDINGS: Heart at the upper limits of normal in size, atherosclerosis of the thoracic aorta. Linear opacity in the right perihilar lung consistent with atelectasis. No consolidation to suggest pneumonia. Pulmonary vasculature is normal. No consolidation, pleural effusion, or pneumothorax. No acute osseous abnormalities are seen. IMPRESSION: Linear right perihilar atelectasis.  No pneumonia. Electronically Signed   By: Jeb Levering M.D.   On: 07/02/2015 00:47      Assessment/Plan:    81 year old female with  Principal Problem:   1.     Pyelonephritis/ Partially Treated UTI   IV Azactam   IVFs   Urine C+S sent  Active Problems:   2.    Dysphagia   NPO except for a few meds   Speech Therapy to evaluate Swallowing for Diet Recommendations     3.    Leukocytosis- due to #1   Monitor Trend        4.    Acute-on-chronic kidney injury ([place])   Gentle IVFs   Monitor BUN/Cr     5.    Hyponatremia- due to Dehydration   IVFs with NSS   Monitor Na+ level     6.    Hyperlipidemia   Continue Simvastatin Rx     7.    Thyroid disease   Continue Levothyroxine Rx     8.    Essential hypertension   IV Hydralazine PRN   Monitor BPs     9.    Adenocarcinoma of liver s/p partial right hepatic lobectomy 06/14/2015   Stable and improving    10.   DVT Prophylaxis    Lovenox    Code Status:     FULL CODE      Family Communication:   Daughter at Bedside     Disposition Plan:    Inpatient Status         Time spent:  34 Minutes      Theressa Millard [facility] Pager [phone]   If [place] Please Contact the Day Rounding Team MD for [facility]  If 7PM-7AM, Please Contact Night-Floor Coverage  [email or web address] Password [email or web address] 07/02/2015, 4:37 AM     ADDENDUM:   Patient was seen and examined on 07/02/2015

## 2015-07-02 NOTE — Progress Notes (Signed)
Patient observed drinking water lying down in bed. No coughing and no signs of problems with swallowing. Patient and family both report that patient has after eating and taking meds have "it come back up". ? Reflux problem. Order for NPO til seen by ST. Daughter and patient both aware.

## 2015-07-03 LAB — GLUCOSE, CAPILLARY
GLUCOSE-CAPILLARY: 108 mg/dL — AB (ref 65–99)
GLUCOSE-CAPILLARY: 126 mg/dL — AB (ref 65–99)
GLUCOSE-CAPILLARY: 134 mg/dL — AB (ref 65–99)

## 2015-07-03 LAB — CBC
HCT: 33.3 % — ABNORMAL LOW (ref 36.0–46.0)
Hemoglobin: 11 g/dL — ABNORMAL LOW (ref 12.0–15.0)
MCH: 28.5 pg (ref 26.0–34.0)
MCHC: 33 g/dL (ref 30.0–36.0)
MCV: 86.3 fL (ref 78.0–100.0)
PLATELETS: 347 10*3/uL (ref 150–400)
RBC: 3.86 MIL/uL — ABNORMAL LOW (ref 3.87–5.11)
RDW: 13.8 % (ref 11.5–15.5)
WBC: 11.7 10*3/uL — ABNORMAL HIGH (ref 4.0–10.5)

## 2015-07-03 LAB — BASIC METABOLIC PANEL
Anion gap: 8 (ref 5–15)
BUN: 24 mg/dL — ABNORMAL HIGH (ref 6–20)
CALCIUM: 9.7 mg/dL (ref 8.9–10.3)
CO2: 24 mmol/L (ref 22–32)
CREATININE: 1.25 mg/dL — AB (ref 0.44–1.00)
Chloride: 103 mmol/L (ref 101–111)
GFR calc non Af Amer: 38 mL/min — ABNORMAL LOW (ref >60)
GFR, EST AFRICAN AMERICAN: 44 mL/min — AB (ref >60)
Glucose, Bld: 106 mg/dL — ABNORMAL HIGH (ref 65–99)
Potassium: 3.8 mmol/L (ref 3.5–5.1)
SODIUM: 135 mmol/L (ref 135–145)

## 2015-07-03 NOTE — Progress Notes (Signed)
CSW contacted family to advise on SNF offers rec'd. CSW will plan to discuss with [facility] and the bed availability there on Monday-  Daughter also considering taking her home with private duty care-   Eduard Clos, MSW, [employer]

## 2015-07-03 NOTE — Progress Notes (Addendum)
[facility]                                                                              Patient Demographics  Destiny Velasquez, is a 81 y.o. female, DOB - 1926-08-06, CNO:[id number]  Admit date - 07/01/2015   Admitting Physician Theressa Millard, MD  Outpatient Primary MD for the patient is [name], [name], DO  LOS - 1   Chief Complaint  Patient presents with  . Elevated WBC       HPI on 07/02/2015 by Dr. Jana Hakim Destiny Velasquez is a 81 y.o. female with history of Liver Adenocarcinoma S/P resection on 06/15/2015 currently in the [facility] for Rehab Rx who was sent to the ED after laboratory results revealed a persistent Elevated WBC despite 10 days of antibiotic rx for a UTI with Levaquin. She was evaluated in the ED and also found to have an elevated BUN/Cr of 41/1.82. A repeat Urine Culture was sent and she was placed on IV Azactam and referred for admission.   One of her daughters is at bedside and reports that she has observed her to have dysphagia x 2 -3 days, and difficulty swallowing her medications  Assessment & Plan   Urinary tract infection with leukocytosis -Leukocytosis improving -UA on 06/22/2015 was positive for infection however no urine culture done at that time. Patient was placed on Levaquin -UA 07/02/2015 shows WBC TNTC, large leukocytes, many bacteria -Urine culture currently pending -Blood cultures pending -Continue aztreonam  Acute on chronic kidney injury, stage III -Likely secondary to dehydration -Creatinine on admission 1.82, currently 1.25 -Continue to monitor BMP -Continue IVF  Dysphagia -Speech therapy consultation appreciated, recommended regular think diet, medication: crushed with puree  Hyponatremia -Resolved, Sodium currently 135, continue monitor BMP -TSH 03/30/2015 1.6 -Continue IVF  Hypothyroidism -Continue Synthroid -TSH 03/30/2015 1.6  Essential hypertension -Blood pressure currently stable -Losartan and  spironolatone/HCTZ held  Hyperlipidemia -Patient no longer taking statin, should follow up with her primary care physician   Adenocarcinoma liver -Status post hepatic lobectomy 06/14/2015 -Stable  -General surgery consulted and appreciated  Physical deconditioning -PT and OT consulted  Code Status: Full  Family Communication: None at bedside, daughter via phone  Disposition Plan: Admitted  Time Spent in minutes 30 minutes  Procedures  None  Consults  General surgery  DVT Prophylaxis Lovenox  Lab Results  Component Value Date   PLT 347 07/03/2015    Medications  Scheduled Meds: . antiseptic oral rinse  7 mL Mouth Rinse BID  . aspirin  81 mg Oral Daily  . aztreonam  500 mg Intravenous Q8H  . enoxaparin (LOVENOX) injection  30 mg Subcutaneous Q24H  . levothyroxine  100 mcg Oral QAC breakfast   Continuous Infusions: . sodium chloride 1,000 mL (07/02/15 1751)   PRN Meds:.acetaminophen **OR** acetaminophen, alum & mag hydroxide-simeth, HYDROmorphone (DILAUDID) injection, LORazepam, ondansetron **OR** ondansetron (ZOFRAN) IV, oxyCODONE  Antibiotics    Anti-infectives    Start     Dose/Rate Route Frequency Ordered Stop   07/02/15 1200  aztreonam (AZACTAM) 500 mg in dextrose 5 % 50 mL IVPB     500 mg 100 mL/hr over 30 Minutes Intravenous Every 8 hours 07/02/15  9417     07/02/15 0400  aztreonam (AZACTAM) 1 g in dextrose 5 % 50 mL IVPB     1 g 100 mL/hr over 30 Minutes Intravenous  Once 07/02/15 0346 07/02/15 0418      Subjective:   Destiny Velasquez seen and examined today.  Patient has no complaints this morning. Feels that she always has a urinary tract infection. Would like to have her coffee. Denies chest pain, shortness of breath, abdominal pain, flank pain.  Objective:   Filed Vitals:   07/02/15 0640 07/02/15 1400 07/02/15 2100 07/03/15 0422  BP:  120/64 151/66 137/65  Pulse: 68 81 88 83  Temp:  98.8 F (37.1 C) 98.4 F (36.9 C) 98.2 F (36.8 C)    TempSrc:  Oral Oral Oral  Resp:  '16 16 16  '$ Height:      Weight:      SpO2: 94% 97% 98% 96%    Wt Readings from Last 3 Encounters:  07/02/15 62.869 kg (138 lb 9.6 oz)  06/14/15 65.318 kg (144 lb)  06/22/15 65.318 kg (144 lb)     Intake/Output Summary (Last 24 hours) at 07/03/15 0940 Last data filed at 07/02/15 2107  Gross per 24 hour  Intake 616.25 ml  Output    100 ml  Net 516.25 ml    Exam  General: Well developed, well nourished, NAD, appears stated age  HEENT: NCAT,  mucous membranes moist.   Cardiovascular: S1 S2 auscultated, no rubs, murmurs or gallops. Regular rate and rhythm.  Respiratory: Clear to auscultation bilaterally with equal chest rise  Abdomen: Soft, nontender, nondistended, + bowel sounds  Extremities: warm dry without cyanosis clubbing or edema  Neuro: AAOx3, nonfocal  Psych: Normal affect and demeanor   Data Review   Micro Results No results found for this or any previous visit (from the past 240 hour(s)).  Radiology Reports Dg Chest 2 View  07/02/2015  CLINICAL DATA:  Right upper quadrant pain. Cough with swallowing. Leukocytosis. EXAM: CHEST  2 VIEW COMPARISON:  PET-CT 05/17/2015 FINDINGS: Heart at the upper limits of normal in size, atherosclerosis of the thoracic aorta. Linear opacity in the right perihilar lung consistent with atelectasis. No consolidation to suggest pneumonia. Pulmonary vasculature is normal. No consolidation, pleural effusion, or pneumothorax. No acute osseous abnormalities are seen. IMPRESSION: Linear right perihilar atelectasis.  No pneumonia. Electronically Signed   By: Jeb Levering M.D.   On: 07/02/2015 00:47   Ct Head Wo Contrast  06/16/2015  CLINICAL DATA:  Mild facial droop, no other neurological deficits EXAM: CT HEAD WITHOUT CONTRAST TECHNIQUE: Contiguous axial images were obtained from the base of the skull through the vertex without intravenous contrast. COMPARISON:  None. FINDINGS: Diffuse age-related  cortical atrophy as well as diffuse low attenuation in the deep white matter. There is no evidence of vascular territory cortical infarct. There is no evidence of mass. There is no hemorrhage or extra-axial fluid. There is no hydrocephalus. Sub cm focus of low attenuation left pons. Sub cm calcified meningioma right frontal region. Calvarium intact. No significant inflammatory change in the visualized portions of the paranasal sinuses. IMPRESSION: Chronic involutional change with no hemorrhage or vascular territory infarct. Tiny focus of low attenuation left pons measuring a few mm may reflect another focus of chronic white matter degeneration versus tiny lacunar infarct of uncertain age. This could be better evaluated with MRI if indicated. Electronically Signed   By: Skipper Cliche M.D.   On: 06/16/2015 08:38   Mr Brain  W Wo Contrast  06/22/2015  CLINICAL DATA:  Abnormal CT scan of the head. EXAM: MRI HEAD WITHOUT AND WITH CONTRAST TECHNIQUE: Multiplanar, multiecho pulse sequences of the brain and surrounding structures were obtained without and with intravenous contrast. CONTRAST:  90m MULTIHANCE GADOBENATE DIMEGLUMINE 529 MG/ML IV SOLN COMPARISON:  CT head without contrast FINDINGS: Moderate generalized atrophy and white matter changes are present bilaterally. No acute infarct, hemorrhage, or mass lesion is present. The ventricles are of normal size. The ventricles are proportionate to the degree of atrophy. No significant extra-axial fluid collection is present. Is brainstem and cerebellum are within normal limits. Flow is present in the major intracranial arteries. Bilateral lens replacements are present. The globes and orbits are otherwise intact. A single posterior right ethmoid air cell is opacified. The remaining paranasal sinuses and mastoid air cells are clear. Postcontrast images demonstrate no pathologic enhancement. IMPRESSION: 1. Moderate atrophy and white matter disease. This likely reflects the  sequela of chronic microvascular ischemia. 2. No acute infarct. Electronically Signed   By: CSan MorelleM.D.   On: 06/22/2015 19:50    CBC  Recent Labs Lab 07/02/15 0026 07/02/15 0610 07/03/15 0443  WBC 14.5* 12.1* 11.7*  HGB 11.4* 11.6* 11.0*  HCT 33.9* 35.2* 33.3*  PLT 386 356 347  MCV 84.5 86.1 86.3  MCH 28.4 28.4 28.5  MCHC 33.6 33.0 33.0  RDW 13.4 13.7 13.8  LYMPHSABS 1.7  --   --   MONOABS 1.4*  --   --   EOSABS 0.6  --   --   BASOSABS 0.1  --   --     Chemistries   Recent Labs Lab 07/02/15 0026 07/02/15 0610 07/03/15 0443  NA 132* 133* 135  K 4.0 3.8 3.8  CL 97* 99* 103  CO2 '28 25 24  '$ GLUCOSE 144* 122* 106*  BUN 41* 35* 24*  CREATININE 1.82* 1.58* 1.25*  CALCIUM 11.0* 10.4* 9.7  AST 24  --   --   ALT 17  --   --   ALKPHOS 84  --   --   BILITOT 0.6  --   --    ------------------------------------------------------------------------------------------------------------------ estimated creatinine clearance is 28.3 mL/min (by C-G formula based on Cr of 1.25). ------------------------------------------------------------------------------------------------------------------ No results for input(s): HGBA1C in the last 72 hours. ------------------------------------------------------------------------------------------------------------------ No results for input(s): CHOL, HDL, LDLCALC, TRIG, CHOLHDL, LDLDIRECT in the last 72 hours. ------------------------------------------------------------------------------------------------------------------ No results for input(s): TSH, T4TOTAL, T3FREE, THYROIDAB in the last 72 hours.  Invalid input(s): FREET3 ------------------------------------------------------------------------------------------------------------------ No results for input(s): VITAMINB12, FOLATE, FERRITIN, TIBC, IRON, RETICCTPCT in the last 72 hours.  Coagulation profile No results for input(s): INR, PROTIME in the last 168 hours.  No results  for input(s): DDIMER in the last 72 hours.  Cardiac Enzymes No results for input(s): CKMB, TROPONINI, MYOGLOBIN in the last 168 hours.  Invalid input(s): CK ------------------------------------------------------------------------------------------------------------------ Invalid input(s): POCBNP    Chirstopher Iovino D.O. on 07/03/2015 at 9:40 AM  Between 7am to 7pm - Pager - [phone] After 7pm go to [email or web address] - password [email or web address]  And look for the night coverage person covering for me after hours  [facility] Office  [phone]

## 2015-07-03 NOTE — Evaluation (Signed)
Physical Therapy Evaluation Patient Details Name: Destiny Velasquez MRN: [id number] DOB: 02-23-1926 Today's Date: 07/03/2015   History of Present Illness  81 yo female admitted with UTI. Pt is from SNF-ST rehab  Clinical Impression  On eval, pt required Min assist for mobility-walked ~150 feet with RW. Pt tolerated session well. At pt's request, assisted her back to bed. Recommend return to SNF for ST rehab    Follow Up Recommendations SNF    Equipment Recommendations  Rolling walker with 5" wheels    Recommendations for Other Services       Precautions / Restrictions Precautions Precautions: Fall Restrictions Weight Bearing Restrictions: No      Mobility  Bed Mobility Overal bed mobility: Needs Assistance Bed Mobility: Supine to Sit;Sit to Supine     Supine to sit: Supervision Sit to supine: Supervision   General bed mobility comments: increased time. reliance on bedrail  Transfers Overall transfer level: Needs assistance Equipment used: Rolling walker (2 wheeled) Transfers: Sit to/from Stand Sit to Stand: Min assist         General transfer comment: Assist to rise, stabilize, control descent.   Ambulation/Gait Ambulation/Gait assistance: Min assist Ambulation Distance (Feet): 150 Feet Assistive device: Rolling walker (2 wheeled) Gait Pattern/deviations: Trunk flexed;Decreased stride length;Step-through pattern     General Gait Details: intemittent assist for stability and maneuvering of walker.  Stairs            Wheelchair Mobility    Modified Rankin (Stroke Patients Only)       Balance           Standing balance support: Bilateral upper extremity supported;During functional activity Standing balance-Leahy Scale: Poor                               Pertinent Vitals/Pain Pain Assessment: No/denies pain    Home Living Family/patient expects to be discharged to:: Skilled nursing facility                      Prior  Function           Comments: walking with walker at rehab     Hand Dominance        Extremity/Trunk Assessment   Upper Extremity Assessment: Generalized weakness           Lower Extremity Assessment: Generalized weakness         Communication   Communication: No difficulties  Cognition Arousal/Alertness: Awake/alert Behavior During Therapy: WFL for tasks assessed/performed Overall Cognitive Status: Within Functional Limits for tasks assessed                      General Comments      Exercises        Assessment/Plan    PT Assessment Patient needs continued PT services  PT Diagnosis Difficulty walking;Generalized weakness   PT Problem List Decreased strength;Decreased activity tolerance;Decreased mobility;Decreased balance  PT Treatment Interventions DME instruction;Gait training;Functional mobility training;Therapeutic activities;Patient/family education;Therapeutic exercise;Balance training   PT Goals (Current goals can be found in the Care Plan section) Acute Rehab PT Goals Patient Stated Goal: to get stronger PT Goal Formulation: With patient/family Time For Goal Achievement: 07/17/15 Potential to Achieve Goals: Good    Frequency Min 3X/week   Barriers to discharge        Co-evaluation               End of Session Equipment  Utilized During Treatment: Gait belt Activity Tolerance: Patient tolerated treatment well Patient left: in bed;with call bell/phone within reach           Time: 1145-1202 PT Time Calculation (min) (ACUTE ONLY): 17 min   Charges:   PT Evaluation $Initial PT Evaluation Tier I: 1 Procedure     PT G Codes:        Weston Anna, MPT Pager: [phone]

## 2015-07-03 NOTE — Progress Notes (Signed)
Initial Nutrition Assessment  DOCUMENTATION CODES:   Severe malnutrition in context of acute illness/injury  INTERVENTION:   -Provide daily snacks -Encourage PO intake -RD to continue to monitor  NUTRITION DIAGNOSIS:   Malnutrition related to acute illness as evidenced by percent weight loss, energy intake < or equal to 50% for > or equal to 5 days, moderate depletion of body fat, mild depletion of muscle mass.  GOAL:   Patient will meet greater than or equal to 90% of their needs  MONITOR:   PO intake, Labs, Weight trends, Skin, I & O's  REASON FOR ASSESSMENT:   Malnutrition Screening Tool    ASSESSMENT:   81 y.o. female with history of Liver Adenocarcinoma S/P resection on 06/15/2015 currently in the [facility] for Rehab Rx who was sent to the ED after laboratory results revealed a persistent Elevated WBC despite 10 days of antibiotic rx for a UTI with Levaquin.  Patient in room with daughter at bedside. Per daughter, pt has not been eating well the past week and has been having swallowing issues for the last 2 days, especially with pills. SLP has evaluated on 11/9 and recommended regular diet with thin liquids. Pt with frequent burps during visit. Advised pt to sit upright after meals to aid with reflux. Pt not interested in Boost or Ensure supplements as they upset her stomach. Pt would like daily snacks, RD to order.  Per weight history, pt has lost 13 lb since 10/04 (9% weight loss x 1.5 months, significant for time frame).  Nutrition-Focused physical exam completed. Findings are moderate fat depletion, mild muscle depletion, and no edema.   Labs reviewed; CBGs: 108-134 Elevated BUN & Creatinine  Diet Order:  Diet regular Room service appropriate?: Yes; Fluid consistency:: Thin  Skin:  Reviewed, no issues  Last BM:  11/19  Height:   Ht Readings from Last 1 Encounters:  07/02/15 '5\' 3"'$  (1.6 m)    Weight:   Wt Readings from Last 1 Encounters:   07/02/15 138 lb 9.6 oz (62.869 kg)    Ideal Body Weight:  52.3 kg  BMI:  Body mass index is 24.56 kg/(m^2).  Estimated Nutritional Needs:   Kcal:  1600-1800  Protein:  75-85g  Fluid:  1.8L/day  EDUCATION NEEDS:   No education needs identified at this time  Clayton Bibles, MS, RD, LDN Pager: [phone] After Hours Pager: [phone]

## 2015-07-03 NOTE — Progress Notes (Signed)
Utilization Review Completed.Destiny Velasquez T11/20/2016  

## 2015-07-04 ENCOUNTER — Encounter (HOSPITAL_COMMUNITY): Payer: Self-pay | Admitting: Radiology

## 2015-07-04 ENCOUNTER — Inpatient Hospital Stay (HOSPITAL_COMMUNITY): Payer: Medicare Other

## 2015-07-04 LAB — URINE CULTURE

## 2015-07-04 LAB — BASIC METABOLIC PANEL
ANION GAP: 3 — AB (ref 5–15)
BUN: 14 mg/dL (ref 6–20)
CO2: 26 mmol/L (ref 22–32)
Calcium: 9.1 mg/dL (ref 8.9–10.3)
Chloride: 106 mmol/L (ref 101–111)
Creatinine, Ser: 0.89 mg/dL (ref 0.44–1.00)
GFR calc Af Amer: >60 mL/min (ref >60)
GFR calc non Af Amer: 57 mL/min — ABNORMAL LOW (ref >60)
GLUCOSE: 115 mg/dL — AB (ref 65–99)
Potassium: 3.6 mmol/L (ref 3.5–5.1)
Sodium: 135 mmol/L (ref 135–145)

## 2015-07-04 LAB — CBC
HEMATOCRIT: 31.8 % — AB (ref 36.0–46.0)
Hemoglobin: 10.6 g/dL — ABNORMAL LOW (ref 12.0–15.0)
MCH: 28.6 pg (ref 26.0–34.0)
MCHC: 33.3 g/dL (ref 30.0–36.0)
MCV: 85.9 fL (ref 78.0–100.0)
PLATELETS: 243 10*3/uL (ref 150–400)
RBC: 3.7 MIL/uL — ABNORMAL LOW (ref 3.87–5.11)
RDW: 13.7 % (ref 11.5–15.5)
WBC: 9.3 10*3/uL (ref 4.0–10.5)

## 2015-07-04 MED ORDER — IOHEXOL 300 MG/ML  SOLN
25.0000 mL | INTRAMUSCULAR | Status: AC
  Administered 2015-07-04 (×2): 25 mL via ORAL

## 2015-07-04 MED ORDER — TRAMADOL HCL 50 MG PO TABS: 50.0000 mg | ORAL_TABLET | Freq: Four times a day (QID) | ORAL | Status: DC | PRN

## 2015-07-04 MED ORDER — IOHEXOL 300 MG/ML  SOLN: 25.0000 mL | INTRAMUSCULAR | Status: DC

## 2015-07-04 MED ORDER — IOHEXOL 300 MG/ML  SOLN
100.0000 mL | Freq: Once | INTRAMUSCULAR | Status: AC | PRN
  Administered 2015-07-04: 100 mL via INTRAVENOUS

## 2015-07-04 MED ORDER — HYDROCODONE-ACETAMINOPHEN 5-325 MG PO TABS: 1.0000 | ORAL_TABLET | Freq: Four times a day (QID) | ORAL | Status: DC | PRN

## 2015-07-04 MED ORDER — ENOXAPARIN SODIUM 40 MG/0.4ML ~~LOC~~ SOLN
40.0000 mg | SUBCUTANEOUS | Status: DC
  Administered 2015-07-06 – 2015-07-11 (×6): 40 mg via SUBCUTANEOUS
  Filled 2015-07-04 (×6): qty 0.4

## 2015-07-04 MED ORDER — ENOXAPARIN SODIUM 40 MG/0.4ML ~~LOC~~ SOLN: 40.0000 mg | SUBCUTANEOUS | Status: DC

## 2015-07-04 MED ORDER — DEXTROSE 5 % IV SOLN
1.0000 g | INTRAVENOUS | Status: DC
  Administered 2015-07-04: 1 g via INTRAVENOUS
  Filled 2015-07-04: qty 10

## 2015-07-04 NOTE — Progress Notes (Signed)
Pharmacy Antibiotic Time-Out Note  Destiny Velasquez is a 81 y.o. year-old female admitted on 07/01/2015.  The patient is currently on Aztreonam for Ecoli UTI.  Assessment/Plan: After discussion with Dr. Ree Kida, Aztreonam will be changed to Ceftriaxone 1g IV q24h based on cultures and/or susceptibilities.    Temp (24hrs), Avg:98 F (36.7 C), Min:97.7 F (36.5 C), Max:98.1 F (36.7 C)   Recent Labs Lab 07/02/15 0026 07/02/15 0610 07/03/15 0443 07/04/15 0444  WBC 14.5* 12.1* 11.7* 9.3    Recent Labs Lab 07/02/15 0026 07/02/15 0610 07/03/15 0443 07/04/15 0444  CREATININE 1.82* 1.58* 1.25* 0.89   Estimated Creatinine Clearance: 39.8 mL/min (by C-G formula based on Cr of 0.89).   Antimicrobial allergies: None  Antimicrobials this admission: 11/19 >>aztreonam >> 11/21 11/21 >> ceftriaxone >>    Microbiology Results: 11/19: blood x 2: NGTD 11/19: urine: => 100,000 colonies/mL E.coli (sens: cefazolin, ceftriaxone, gent, imipenem, NTF, P/T, T/S)  Thank you for allowing pharmacy to be a part of this patient's care.  Gretta Arab PharmD, BCPS Pager [phone] 07/04/2015 1:08 PM

## 2015-07-04 NOTE — Progress Notes (Signed)
IR PA aware of request for image guided drain placement. Images reviewed by Dr. Vernard Gambles and collection is amendable to drain. Will HOLD Lovenox and make NPO after midnight for drain 11/22  Tsosie Billing PA-C Interventional Radiology  07/04/15  4:56 PM

## 2015-07-04 NOTE — Progress Notes (Signed)
OT Cancellation Note  Patient Details Name: Tomma Ehinger MRN: [id number] DOB: 1926-01-24  Noted plan is back to SNF for rehab. Will defer OT eval for SNF.     Betsy Pries 07/04/2015, 3:25 PM

## 2015-07-04 NOTE — Progress Notes (Signed)
CSW continuing to follow.   CSW spoke with [facility] and [employer] today and facility confirmed that pt daughter has held a bed at facility and [facility] can accept pt back when medically ready for discharge.   Pt had CT abdomen/pelvis today and disposition is dependent on results.   CSW spoke with pt and pt daughter regarding discharge plan today. Support provided.   CSW to continue to follow to provide support and assist with pt disposition needs when pt medically ready for discharge.  Alison Murray, MSW, [place] [facility] [phone]

## 2015-07-04 NOTE — Progress Notes (Signed)
[facility]                                                                              Patient Demographics  Destiny Velasquez, is a 81 y.o. female, DOB - 1926/07/26, OZD:[id number]  Admit date - 07/01/2015   Admitting Physician Theressa Millard, MD  Outpatient Primary MD for the patient is [name], [name], DO  LOS - 2   Chief Complaint  Patient presents with  . Elevated WBC       HPI on 07/02/2015 by Dr. Jana Hakim Destiny Velasquez is a 81 y.o. female with history of Liver Adenocarcinoma S/P resection on 06/15/2015 currently in the [facility] who was sent to the ED after laboratory results revealed a persistent Elevated WBC despite 10 days of antibiotic rx for a UTI with Levaquin. She was evaluated in the ED and also found to have an elevated BUN/Cr of 41/1.82. A repeat Urine Culture was sent and she was placed on IV Azactam and referred for admission.   One of her daughters is at bedside and reports that she has observed her to have dysphagia x 2 -3 days, and difficulty swallowing her medications  Assessment & Plan   Urinary tract infection with leukocytosis -Leukocytosis improving -UA on 06/22/2015 was positive for infection however no urine culture done at that time. Patient was placed on Levaquin -UA 07/02/2015 shows WBC TNTC, large leukocytes, many bacteria -Urine culture: Ecoli mainly pan sensitive (resitant to ampicillin) -Blood cultures show no growth -Continue aztreonam  Acute on chronic kidney injury, stage III -Likely secondary to dehydration -Creatinine on admission 1.82, currently 0.89 -Continue to monitor BMP  Dysphagia -Speech therapy consultation appreciated, recommended regular think diet, medication: crushed with puree  Hyponatremia -Resolved, Sodium currently 135, continue monitor BMP -TSH 03/30/2015 1.6 -Continue IVF  Hypothyroidism -Continue Synthroid -TSH 03/30/2015 1.6  Essential hypertension -Blood pressure currently  stable -Losartan and spironolatone/HCTZ held  Hyperlipidemia -Patient no longer taking statin, should follow up with her primary care physician   Adenocarcinoma liver -Status post hepatic lobectomy 06/14/2015 -Stable  -General surgery consulted and appreciated -Patient's daughter has requested repeat CT abd.   Patient denies nausea vomiting, abd exam benign on exam -Pending CT abd  Physical deconditioning -PT rec SNF  Code Status: Full  Family Communication: Daughters at bedside  Disposition Plan: Admitted, Pending SNF  Time Spent in minutes 30 minutes  Procedures  None  Consults  General surgery  DVT Prophylaxis Lovenox  Lab Results  Component Value Date   PLT 243 07/04/2015    Medications  Scheduled Meds: . antiseptic oral rinse  7 mL Mouth Rinse BID  . aspirin  81 mg Oral Daily  . aztreonam  500 mg Intravenous Q8H  . enoxaparin (LOVENOX) injection  30 mg Subcutaneous Q24H  . [COMPLETED] iohexol  25 mL Oral Q1 Hr x 2  . levothyroxine  100 mcg Oral QAC breakfast   Continuous Infusions: . sodium chloride 1,000 mL (07/04/15 0403)   PRN Meds:.acetaminophen **OR** acetaminophen, alum & mag hydroxide-simeth, HYDROmorphone (DILAUDID) injection, LORazepam, ondansetron **OR** ondansetron (ZOFRAN) IV, oxyCODONE  Antibiotics    Anti-infectives    Start     Dose/Rate  Route Frequency Ordered Stop   07/02/15 1200  aztreonam (AZACTAM) 500 mg in dextrose 5 % 50 mL IVPB     500 mg 100 mL/hr over 30 Minutes Intravenous Every 8 hours 07/02/15 0545     07/02/15 0400  aztreonam (AZACTAM) 1 g in dextrose 5 % 50 mL IVPB     1 g 100 mL/hr over 30 Minutes Intravenous  Once 07/02/15 0346 07/02/15 0418      Subjective:   [name] seen and examined today.  Patient has no complaints this morning and is feeling better.  Denies chest pain, shortness of breath, abdominal pain, flank pain.  Objective:   Filed Vitals:   07/03/15 0422 07/03/15 1346 07/03/15 2044  07/04/15 0441  BP: 137/65 125/45 127/56 130/64  Pulse: 83 81 86 76  Temp: 98.2 F (36.8 C) 98.1 F (36.7 C) 98.1 F (36.7 C) 97.7 F (36.5 C)  TempSrc: Oral Oral Oral Oral  Resp: '16 16 16 16  '$ Height:      Weight:      SpO2: 96% 98% 97% 96%    Wt Readings from Last 3 Encounters:  07/02/15 62.869 kg (138 lb 9.6 oz)  06/14/15 65.318 kg (144 lb)  06/22/15 65.318 kg (144 lb)     Intake/Output Summary (Last 24 hours) at 07/04/15 1224 Last data filed at 07/03/15 1517  Gross per 24 hour  Intake   1365 ml  Output      0 ml  Net   1365 ml    Exam  General: Well developed, well nourished, NAD  HEENT: NCAT,  mucous membranes moist.   Cardiovascular: S1 S2 auscultated, RRR, no murmurs  Respiratory: Clear to auscultation   Abdomen: Soft, nontender, nondistended, + bowel sounds  Extremities: warm dry without cyanosis clubbing or edema  Neuro: AAOx3, nonfocal  Psych: Normal affect and demeanor, pleasant  Data Review   Micro Results Recent Results (from the past 240 hour(s))  Blood Culture (routine x 2)     Status: None (Preliminary result)   Collection Time: 07/02/15 12:20 AM  Result Value Ref Range Status   Specimen Description BLOOD LEFT ANTECUBITAL  Final   Special Requests BOTTLES DRAWN AEROBIC AND ANAEROBIC 5ML  Final   Culture   Final    NO GROWTH 2 DAYS Performed at [facility]    Report Status PENDING  Incomplete  Blood Culture (routine x 2)     Status: None (Preliminary result)   Collection Time: 07/02/15 12:27 AM  Result Value Ref Range Status   Specimen Description BLOOD LEFT HAND  Final   Special Requests IN PEDIATRIC BOTTLE 5CC  Final   Culture   Final    NO GROWTH 2 DAYS Performed at [facility]    Report Status PENDING  Incomplete  Urine culture     Status: None   Collection Time: 07/02/15  1:41 AM  Result Value Ref Range Status   Specimen Description URINE, CLEAN CATCH  Final   Special Requests NONE  Final   Culture   Final      >=100,000 COLONIES/mL ESCHERICHIA COLI Performed at [facility]    Report Status 07/04/2015 FINAL  Final   Organism ID, Bacteria ESCHERICHIA COLI  Final      Susceptibility   Escherichia coli - MIC*    AMPICILLIN >=32 RESISTANT Resistant     CEFAZOLIN <=4 SENSITIVE Sensitive     CEFTRIAXONE <=1 SENSITIVE Sensitive     CIPROFLOXACIN >=4 RESISTANT Resistant  GENTAMICIN <=1 SENSITIVE Sensitive     IMIPENEM 0.5 SENSITIVE Sensitive     NITROFURANTOIN <=16 SENSITIVE Sensitive     TRIMETH/SULFA <=20 SENSITIVE Sensitive     AMPICILLIN/SULBACTAM 16 INTERMEDIATE Intermediate     PIP/TAZO 8 SENSITIVE Sensitive     * >=100,000 COLONIES/mL ESCHERICHIA COLI    Radiology Reports Dg Chest 2 View  07/02/2015  CLINICAL DATA:  Right upper quadrant pain. Cough with swallowing. Leukocytosis. EXAM: CHEST  2 VIEW COMPARISON:  PET-CT 05/17/2015 FINDINGS: Heart at the upper limits of normal in size, atherosclerosis of the thoracic aorta. Linear opacity in the right perihilar lung consistent with atelectasis. No consolidation to suggest pneumonia. Pulmonary vasculature is normal. No consolidation, pleural effusion, or pneumothorax. No acute osseous abnormalities are seen. IMPRESSION: Linear right perihilar atelectasis.  No pneumonia. Electronically Signed   By: Jeb Levering M.D.   On: 07/02/2015 00:47   Ct Head Wo Contrast  06/16/2015  CLINICAL DATA:  Mild facial droop, no other neurological deficits EXAM: CT HEAD WITHOUT CONTRAST TECHNIQUE: Contiguous axial images were obtained from the base of the skull through the vertex without intravenous contrast. COMPARISON:  None. FINDINGS: Diffuse age-related cortical atrophy as well as diffuse low attenuation in the deep white matter. There is no evidence of vascular territory cortical infarct. There is no evidence of mass. There is no hemorrhage or extra-axial fluid. There is no hydrocephalus. Sub cm focus of low attenuation left pons. Sub cm calcified  meningioma right frontal region. Calvarium intact. No significant inflammatory change in the visualized portions of the paranasal sinuses. IMPRESSION: Chronic involutional change with no hemorrhage or vascular territory infarct. Tiny focus of low attenuation left pons measuring a few mm may reflect another focus of chronic white matter degeneration versus tiny lacunar infarct of uncertain age. This could be better evaluated with MRI if indicated. Electronically Signed   By: Skipper Cliche M.D.   On: 06/16/2015 08:38   Mr Jeri Cos QH Contrast  06/22/2015  CLINICAL DATA:  Abnormal CT scan of the head. EXAM: MRI HEAD WITHOUT AND WITH CONTRAST TECHNIQUE: Multiplanar, multiecho pulse sequences of the brain and surrounding structures were obtained without and with intravenous contrast. CONTRAST:  99m MULTIHANCE GADOBENATE DIMEGLUMINE 529 MG/ML IV SOLN COMPARISON:  CT head without contrast FINDINGS: Moderate generalized atrophy and white matter changes are present bilaterally. No acute infarct, hemorrhage, or mass lesion is present. The ventricles are of normal size. The ventricles are proportionate to the degree of atrophy. No significant extra-axial fluid collection is present. Is brainstem and cerebellum are within normal limits. Flow is present in the major intracranial arteries. Bilateral lens replacements are present. The globes and orbits are otherwise intact. A single posterior right ethmoid air cell is opacified. The remaining paranasal sinuses and mastoid air cells are clear. Postcontrast images demonstrate no pathologic enhancement. IMPRESSION: 1. Moderate atrophy and white matter disease. This likely reflects the sequela of chronic microvascular ischemia. 2. No acute infarct. Electronically Signed   By: CSan MorelleM.D.   On: 06/22/2015 19:50    CBC  Recent Labs Lab 07/02/15 0026 07/02/15 0610 07/03/15 0443 07/04/15 0444  WBC 14.5* 12.1* 11.7* 9.3  HGB 11.4* 11.6* 11.0* 10.6*  HCT 33.9*  35.2* 33.3* 31.8*  PLT 386 356 347 243  MCV 84.5 86.1 86.3 85.9  MCH 28.4 28.4 28.5 28.6  MCHC 33.6 33.0 33.0 33.3  RDW 13.4 13.7 13.8 13.7  LYMPHSABS 1.7  --   --   --   MONOABS  1.4*  --   --   --   EOSABS 0.6  --   --   --   BASOSABS 0.1  --   --   --     Chemistries   Recent Labs Lab 07/02/15 0026 07/02/15 0610 07/03/15 0443 07/04/15 0444  NA 132* 133* 135 135  K 4.0 3.8 3.8 3.6  CL 97* 99* 103 106  CO2 '28 25 24 26  '$ GLUCOSE 144* 122* 106* 115*  BUN 41* 35* 24* 14  CREATININE 1.82* 1.58* 1.25* 0.89  CALCIUM 11.0* 10.4* 9.7 9.1  AST 24  --   --   --   ALT 17  --   --   --   ALKPHOS 84  --   --   --   BILITOT 0.6  --   --   --    ------------------------------------------------------------------------------------------------------------------ estimated creatinine clearance is 39.8 mL/min (by C-G formula based on Cr of 0.89). ------------------------------------------------------------------------------------------------------------------ No results for input(s): HGBA1C in the last 72 hours. ------------------------------------------------------------------------------------------------------------------ No results for input(s): CHOL, HDL, LDLCALC, TRIG, CHOLHDL, LDLDIRECT in the last 72 hours. ------------------------------------------------------------------------------------------------------------------ No results for input(s): TSH, T4TOTAL, T3FREE, THYROIDAB in the last 72 hours.  Invalid input(s): FREET3 ------------------------------------------------------------------------------------------------------------------ No results for input(s): VITAMINB12, FOLATE, FERRITIN, TIBC, IRON, RETICCTPCT in the last 72 hours.  Coagulation profile No results for input(s): INR, PROTIME in the last 168 hours.  No results for input(s): DDIMER in the last 72 hours.  Cardiac Enzymes No results for input(s): CKMB, TROPONINI, MYOGLOBIN in the last 168 hours.  Invalid input(s):  CK ------------------------------------------------------------------------------------------------------------------ Invalid input(s): POCBNP    Laterrica Libman D.O. on 07/04/2015 at 12:24 PM  Between 7am to 7pm - Pager - [phone]  After 7pm go to [email or web address] - password [email or web address]  And look for the night coverage person covering for me after hours  [facility] Office  [phone]

## 2015-07-04 NOTE — Progress Notes (Signed)
Patient ID: Destiny Velasquez, female   DOB: November 20, 1925, 81 y.o.   MRN: [id number]  [facility]  HD#: 4  Subjective: Patient's mental status and appetite have cleared significantly since Friday upon arrival.  No fever/chills.    Objective: Vital signs in last 24 hours: Temp:  [97.7 F (36.5 C)-98.1 F (36.7 C)] 97.8 F (36.6 C) (11/21 1336) Pulse Rate:  [71-86] 71 (11/21 1336) Resp:  [16-18] 18 (11/21 1336) BP: (127-149)/(56-64) 149/61 mmHg (11/21 1336) SpO2:  [96 %-98 %] 98 % (11/21 1336) Last BM Date: 07/04/15  Intake/Output from previous day: 11/20 0701 - 11/21 0700 In: 1655 [P.O.:480; I.V.:1125; IV Piggyback:50] Out: -  Intake/Output this shift:    Physical Exam: HEENT - sclerae clear, mucous membranes moist Gen - crotchety! Neck - soft Chest - clear bilaterally Abdomen - soft without distension; BS present; surgical wounds dry and intact with steristrips in place  Lab Results:   Recent Labs  07/03/15 0443 07/04/15 0444  WBC 11.7* 9.3  HGB 11.0* 10.6*  HCT 33.3* 31.8*  PLT 347 243   BMET  Recent Labs  07/03/15 0443 07/04/15 0444  NA 135 135  K 3.8 3.6  CL 103 106  CO2 24 26  GLUCOSE 106* 115*  BUN 24* 14  CREATININE 1.25* 0.89  CALCIUM 9.7 9.1   PT/INR No results for input(s): LABPROT, INR in the last 72 hours. Comprehensive Metabolic Panel:    Component Value Date/Time   NA 135 07/04/2015 0444   NA 135 07/03/2015 0443   NA 139 03/30/2015 0813   NA 139 09/20/2014 0816   K 3.6 07/04/2015 0444   K 3.8 07/03/2015 0443   CL 106 07/04/2015 0444   CL 103 07/03/2015 0443   CO2 26 07/04/2015 0444   CO2 24 07/03/2015 0443   BUN 14 07/04/2015 0444   BUN 24* 07/03/2015 0443   BUN 18 03/30/2015 0813   BUN 18 09/20/2014 0816   CREATININE 0.89 07/04/2015 0444   CREATININE 1.25* 07/03/2015 0443   GLUCOSE 115* 07/04/2015 0444   GLUCOSE 106* 07/03/2015 0443   GLUCOSE 119* 03/30/2015 0813   GLUCOSE 132* 09/20/2014  0816   CALCIUM 9.1 07/04/2015 0444   CALCIUM 9.7 07/03/2015 0443   AST 24 07/02/2015 0026   AST 22 06/23/2015 0850   ALT 17 07/02/2015 0026   ALT 45 06/23/2015 0850   ALKPHOS 84 07/02/2015 0026   ALKPHOS 90 06/23/2015 0850   BILITOT 0.6 07/02/2015 0026   BILITOT 1.1 06/23/2015 0850   BILITOT 0.3 09/20/2014 0816   PROT 6.9 07/02/2015 0026   PROT 6.6 06/23/2015 0850   PROT 6.6 09/20/2014 0816   PROT 6.8 03/17/2014 0807   ALBUMIN 3.0* 07/02/2015 0026   ALBUMIN 3.1* 06/23/2015 0850   ALBUMIN 3.9 09/20/2014 0816   ALBUMIN 3.9 03/17/2014 0807    Studies/Results: Ct Abdomen Pelvis W Contrast  07/04/2015  CLINICAL DATA:  Three weeks status post resection of hepatic adenocarcinoma. Nausea. Persistent leukocytosis. EXAM: CT ABDOMEN AND PELVIS WITH CONTRAST TECHNIQUE: Multidetector CT imaging of the abdomen and pelvis was performed using the standard protocol following bolus administration of intravenous contrast. CONTRAST:  178m OMNIPAQUE IOHEXOL 300 MG/ML  SOLN COMPARISON:  05/03/2015 FINDINGS: Lower chest: Small bilateral pleural effusions noted as well as tiny pericardial effusion. Mild wall thickening of distal thoracic esophagus is suspicious for esophagitis. Hepatobiliary: There has been interval resection of previously seen right hepatic lobe mass. There is an irregular rim enhancing fluid  collection within the surgical bed in the right hepatic lobe which measures 8.1 x 8.8 cm on image 32/series 3. This could be due to postop fluid collection or abscess. No other liver lesions are identified. A small rim enhancing perihepatic fluid collection is also seen adjacent to the anterior right hepatic lobe which measures 1.8 x 7.8 cm on image 26/ series 3. This could also represent postop fluid collection or abscess. Prior cholecystectomy again noted. Dilatation of extrahepatic common bile duct remains stable. Pancreas: No mass, inflammatory changes, or other significant abnormality. Spleen: Within  normal limits in size and appearance. Adrenals/Urinary Tract: No masses identified. No evidence of hydronephrosis. Mild diffuse bladder wall thickening noted as well as mild dilatation, possibly due to neurogenic bladder. Stomach/Bowel: Diverticulitis near the junction of the descending and sigmoid colon shows improvement since previous study. Vascular/Lymphatic: No pathologically enlarged lymph nodes. No evidence of abdominal aortic aneurysm. Reproductive: Previous hysterectomy a 3.8 cm cyst with a tiny posterior mural calcification is seen in the left adnexa on image 96 98/series 3 which is not significant changed since previous study. No definite malignant features identified. This could represent a diverticular abscess or cystic lesion of left ovary. Other: Small amount of free fluid in pelvic cul-de-sac. Musculoskeletal: No suspicious bone lesions identified. Spondylosis grade 2 anterolisthesis noted at L4-5. IMPRESSION: 9 cm rim enhancing fluid collection in right hepatic lobe surgical bed, and smaller perihepatic fluid collection adjacent to right lobe. Differential diagnosis includes postop fluid collections and abscess. Improving diverticulitis. Persistent 3.8 cm left lower quadrant fluid collection, which could represent a diverticular abscess or left ovarian cystic lesion. No malignant features identified. Recommend continued attention on follow-up imaging. Small bilateral pleural effusions, tiny pericardial effusion, and small amount free fluid in pelvic cul-de-sac. Distal esophageal wall thickening, suspicious for esophagitis. Mildly distended urinary bladder with diffuse wall thickening, suspicious for neurogenic bladder. Electronically Signed   By: Earle Gell M.D.   On: 07/04/2015 15:15    Anti-infectives: Anti-infectives    Start     Dose/Rate Route Frequency Ordered Stop   07/04/15 2000  cefTRIAXone (ROCEPHIN) 1 g in dextrose 5 % 50 mL IVPB     1 g 100 mL/hr over 30 Minutes Intravenous  Every 24 hours 07/04/15 1255     07/02/15 1200  aztreonam (AZACTAM) 500 mg in dextrose 5 % 50 mL IVPB  Status:  Discontinued     500 mg 100 mL/hr over 30 Minutes Intravenous Every 8 hours 07/02/15 0545 07/04/15 1255   07/02/15 0400  aztreonam (AZACTAM) 1 g in dextrose 5 % 50 mL IVPB     1 g 100 mL/hr over 30 Minutes Intravenous  Once 07/02/15 0346 07/02/15 0418      Assessment & Plans: Status post partial hepatectomy  Will ask IR to perc drain fluid collection.    Continue antibiotics.  Pyelonephritis  Management per medical team  [facility] Office: [place] 07/04/2015

## 2015-07-05 ENCOUNTER — Inpatient Hospital Stay (HOSPITAL_COMMUNITY): Payer: Medicare Other

## 2015-07-05 DIAGNOSIS — L0291 Cutaneous abscess, unspecified: Secondary | ICD-10-CM | POA: Diagnosis present

## 2015-07-05 LAB — BASIC METABOLIC PANEL
ANION GAP: 6 (ref 5–15)
BUN: 9 mg/dL (ref 6–20)
CALCIUM: 9 mg/dL (ref 8.9–10.3)
CO2: 23 mmol/L (ref 22–32)
Chloride: 106 mmol/L (ref 101–111)
Creatinine, Ser: 0.74 mg/dL (ref 0.44–1.00)
Glucose, Bld: 112 mg/dL — ABNORMAL HIGH (ref 65–99)
POTASSIUM: 3.5 mmol/L (ref 3.5–5.1)
SODIUM: 135 mmol/L (ref 135–145)

## 2015-07-05 LAB — CBC
HCT: 33.5 % — ABNORMAL LOW (ref 36.0–46.0)
Hemoglobin: 11 g/dL — ABNORMAL LOW (ref 12.0–15.0)
MCH: 28.2 pg (ref 26.0–34.0)
MCHC: 32.8 g/dL (ref 30.0–36.0)
MCV: 85.9 fL (ref 78.0–100.0)
PLATELETS: 302 10*3/uL (ref 150–400)
RBC: 3.9 MIL/uL (ref 3.87–5.11)
RDW: 13.8 % (ref 11.5–15.5)
WBC: 14.5 10*3/uL — AB (ref 4.0–10.5)

## 2015-07-05 LAB — PROTIME-INR
INR: 1.13 (ref 0.00–1.49)
PROTHROMBIN TIME: 14.7 s (ref 11.6–15.2)

## 2015-07-05 MED ORDER — PIPERACILLIN-TAZOBACTAM 3.375 G IVPB
3.3750 g | Freq: Three times a day (TID) | INTRAVENOUS | Status: DC
  Administered 2015-07-05 – 2015-07-09 (×13): 3.375 g via INTRAVENOUS
  Filled 2015-07-05 (×16): qty 50

## 2015-07-05 MED ORDER — LORAZEPAM 0.5 MG PO TABS
0.5000 mg | ORAL_TABLET | Freq: Four times a day (QID) | ORAL | Status: DC | PRN
  Administered 2015-07-05 – 2015-07-06 (×3): 0.5 mg via ORAL
  Filled 2015-07-05 (×3): qty 1

## 2015-07-05 MED ORDER — FENTANYL CITRATE (PF) 100 MCG/2ML IJ SOLN
INTRAMUSCULAR | Status: AC
  Filled 2015-07-05: qty 2

## 2015-07-05 MED ORDER — MIDAZOLAM HCL 2 MG/2ML IJ SOLN
INTRAMUSCULAR | Status: AC
  Filled 2015-07-05: qty 4

## 2015-07-05 MED ORDER — FLUMAZENIL 0.5 MG/5ML IV SOLN
INTRAVENOUS | Status: AC
  Filled 2015-07-05: qty 5

## 2015-07-05 MED ORDER — NALOXONE HCL 0.4 MG/ML IJ SOLN
INTRAMUSCULAR | Status: AC
  Filled 2015-07-05: qty 1

## 2015-07-05 MED ORDER — LORAZEPAM 1 MG PO TABS
1.0000 mg | ORAL_TABLET | Freq: Four times a day (QID) | ORAL | Status: DC | PRN
  Administered 2015-07-05: 1 mg via ORAL
  Filled 2015-07-05: qty 1

## 2015-07-05 NOTE — Progress Notes (Signed)
Physical Therapy Treatment Patient Details Name: Destiny Velasquez MRN: [id number] DOB: 05-Aug-1926 Today's Date: 07/05/2015    History of Present Illness 81 yo female admitted with UTI. Pt is from [facility]. s/p partial hepatectomy 06/14/15. Admitted with pyelonephritis, drain to be placed 07/05/15.     PT Comments    Pt progressing gradually with mobility, she tolerated increased gait distance today. She is anxious about drain placement later today.   Follow Up Recommendations  SNF     Equipment Recommendations  Rolling walker with 5" wheels    Recommendations for Other Services       Precautions / Restrictions Precautions Precautions: Fall Restrictions Weight Bearing Restrictions: No    Mobility  Bed Mobility               General bed mobility comments: sitting on EOB at start of tx  Transfers Overall transfer level: Needs assistance Equipment used: Rolling walker (2 wheeled) Transfers: Sit to/from Stand Sit to Stand: Min guard         General transfer comment: verbal cues for hand placement, min/guard safety  Ambulation/Gait Ambulation/Gait assistance: Min guard Ambulation Distance (Feet): 170 Feet Assistive device: Rolling walker (2 wheeled) Gait Pattern/deviations: Step-through pattern;Decreased stride length;Trunk flexed Gait velocity: decreased Gait velocity interpretation: Below normal speed for age/gender General Gait Details: verbal cues to avoid obstacles and to correct forward head posture, steady, no LOB   Stairs            Wheelchair Mobility    Modified Rankin (Stroke Patients Only)       Balance     Sitting balance-Leahy Scale: Good       Standing balance-Leahy Scale: Fair                      Cognition Arousal/Alertness: Awake/alert Behavior During Therapy: WFL for tasks assessed/performed Overall Cognitive Status: Within Functional Limits for tasks assessed                      Exercises       General Comments        Pertinent Vitals/Pain Pain Assessment: No/denies pain    Home Living                      Prior Function            PT Goals (current goals can now be found in the care plan section) Acute Rehab PT Goals Patient Stated Goal: to get stronger, to go for walks, likes to read murder mysteries PT Goal Formulation: With patient/family Time For Goal Achievement: 07/17/15 Potential to Achieve Goals: Good Progress towards PT goals: Progressing toward goals    Frequency  Min 3X/week    PT Plan Current plan remains appropriate    Co-evaluation             End of Session Equipment Utilized During Treatment: Gait belt Activity Tolerance: Patient tolerated treatment well Patient left: with call bell/phone within reach;in chair;with chair alarm set     Time: 4627-0350 PT Time Calculation (min) (ACUTE ONLY): 20 min  Charges:  $Gait Training: 8-22 mins                    G Codes:      Philomena Doheny 07/05/2015, 11:25 AM [phone]

## 2015-07-05 NOTE — Progress Notes (Signed)
Patient ID: Destiny Velasquez, female   DOB: Oct 24, 1925, 81 y.o.   MRN: [id number]  [facility]  HD#: 5  Subjective: Patient awaiting IR drain placement.    Objective: Vital signs in last 24 hours: Temp:  [97.5 F (36.4 C)-98.3 F (36.8 C)] 97.5 F (36.4 C) (11/22 1352) Pulse Rate:  [78-88] 79 (11/22 1352) Resp:  [18-20] 20 (11/22 1352) BP: (118-142)/(45-60) 118/45 mmHg (11/22 1352) SpO2:  [96 %-98 %] 96 % (11/22 1352) Last BM Date: 07/04/15  Intake/Output from previous day: 11/21 0701 - 11/22 0700 In: 4826.3 [P.O.:70; I.V.:4756.3] Out: -  Intake/Output this shift:    Physical Exam: Gen - A&O.  Tired.   Neck - soft Chest - clear bilaterally Abdomen - soft without distension; non tender.  Lab Results:   Recent Labs  07/04/15 0444 07/05/15 0415  WBC 9.3 14.5*  HGB 10.6* 11.0*  HCT 31.8* 33.5*  PLT 243 302   BMET  Recent Labs  07/04/15 0444 07/05/15 0415  NA 135 135  K 3.6 3.5  CL 106 106  CO2 26 23  GLUCOSE 115* 112*  BUN 14 9  CREATININE 0.89 0.74  CALCIUM 9.1 9.0   PT/INR  Recent Labs  07/05/15 0415  LABPROT 14.7  INR 1.13   Comprehensive Metabolic Panel:    Component Value Date/Time   NA 135 07/05/2015 0415   NA 135 07/04/2015 0444   NA 139 03/30/2015 0813   NA 139 09/20/2014 0816   K 3.5 07/05/2015 0415   K 3.6 07/04/2015 0444   CL 106 07/05/2015 0415   CL 106 07/04/2015 0444   CO2 23 07/05/2015 0415   CO2 26 07/04/2015 0444   BUN 9 07/05/2015 0415   BUN 14 07/04/2015 0444   BUN 18 03/30/2015 0813   BUN 18 09/20/2014 0816   CREATININE 0.74 07/05/2015 0415   CREATININE 0.89 07/04/2015 0444   GLUCOSE 112* 07/05/2015 0415   GLUCOSE 115* 07/04/2015 0444   GLUCOSE 119* 03/30/2015 0813   GLUCOSE 132* 09/20/2014 0816   CALCIUM 9.0 07/05/2015 0415   CALCIUM 9.1 07/04/2015 0444   AST 24 07/02/2015 0026   AST 22 06/23/2015 0850   ALT 17 07/02/2015 0026   ALT 45 06/23/2015 0850   ALKPHOS 84 07/02/2015  0026   ALKPHOS 90 06/23/2015 0850   BILITOT 0.6 07/02/2015 0026   BILITOT 1.1 06/23/2015 0850   BILITOT 0.3 09/20/2014 0816   PROT 6.9 07/02/2015 0026   PROT 6.6 06/23/2015 0850   PROT 6.6 09/20/2014 0816   PROT 6.8 03/17/2014 0807   ALBUMIN 3.0* 07/02/2015 0026   ALBUMIN 3.1* 06/23/2015 0850   ALBUMIN 3.9 09/20/2014 0816   ALBUMIN 3.9 03/17/2014 0807    Studies/Results: Ct Abdomen Pelvis W Contrast  07/04/2015  CLINICAL DATA:  Three weeks status post resection of hepatic adenocarcinoma. Nausea. Persistent leukocytosis. EXAM: CT ABDOMEN AND PELVIS WITH CONTRAST TECHNIQUE: Multidetector CT imaging of the abdomen and pelvis was performed using the standard protocol following bolus administration of intravenous contrast. CONTRAST:  161m OMNIPAQUE IOHEXOL 300 MG/ML  SOLN COMPARISON:  05/03/2015 FINDINGS: Lower chest: Small bilateral pleural effusions noted as well as tiny pericardial effusion. Mild wall thickening of distal thoracic esophagus is suspicious for esophagitis. Hepatobiliary: There has been interval resection of previously seen right hepatic lobe mass. There is an irregular rim enhancing fluid collection within the surgical bed in the right hepatic lobe which measures 8.1 x 8.8 cm on image 32/series 3. This  could be due to postop fluid collection or abscess. No other liver lesions are identified. A small rim enhancing perihepatic fluid collection is also seen adjacent to the anterior right hepatic lobe which measures 1.8 x 7.8 cm on image 26/ series 3. This could also represent postop fluid collection or abscess. Prior cholecystectomy again noted. Dilatation of extrahepatic common bile duct remains stable. Pancreas: No mass, inflammatory changes, or other significant abnormality. Spleen: Within normal limits in size and appearance. Adrenals/Urinary Tract: No masses identified. No evidence of hydronephrosis. Mild diffuse bladder wall thickening noted as well as mild dilatation, possibly due  to neurogenic bladder. Stomach/Bowel: Diverticulitis near the junction of the descending and sigmoid colon shows improvement since previous study. Vascular/Lymphatic: No pathologically enlarged lymph nodes. No evidence of abdominal aortic aneurysm. Reproductive: Previous hysterectomy a 3.8 cm cyst with a tiny posterior mural calcification is seen in the left adnexa on image 96 98/series 3 which is not significant changed since previous study. No definite malignant features identified. This could represent a diverticular abscess or cystic lesion of left ovary. Other: Small amount of free fluid in pelvic cul-de-sac. Musculoskeletal: No suspicious bone lesions identified. Spondylosis grade 2 anterolisthesis noted at L4-5. IMPRESSION: 9 cm rim enhancing fluid collection in right hepatic lobe surgical bed, and smaller perihepatic fluid collection adjacent to right lobe. Differential diagnosis includes postop fluid collections and abscess. Improving diverticulitis. Persistent 3.8 cm left lower quadrant fluid collection, which could represent a diverticular abscess or left ovarian cystic lesion. No malignant features identified. Recommend continued attention on follow-up imaging. Small bilateral pleural effusions, tiny pericardial effusion, and small amount free fluid in pelvic cul-de-sac. Distal esophageal wall thickening, suspicious for esophagitis. Mildly distended urinary bladder with diffuse wall thickening, suspicious for neurogenic bladder. Electronically Signed   By: Earle Gell M.D.   On: 07/04/2015 15:15    Anti-infectives: Anti-infectives    Start     Dose/Rate Route Frequency Ordered Stop   07/05/15 0815  piperacillin-tazobactam (ZOSYN) IVPB 3.375 g     3.375 g 12.5 mL/hr over 240 Minutes Intravenous Every 8 hours 07/05/15 0813     07/04/15 2000  cefTRIAXone (ROCEPHIN) 1 g in dextrose 5 % 50 mL IVPB  Status:  Discontinued     1 g 100 mL/hr over 30 Minutes Intravenous Every 24 hours 07/04/15 1255  07/05/15 0805   07/02/15 1200  aztreonam (AZACTAM) 500 mg in dextrose 5 % 50 mL IVPB  Status:  Discontinued     500 mg 100 mL/hr over 30 Minutes Intravenous Every 8 hours 07/02/15 0545 07/04/15 1255   07/02/15 0400  aztreonam (AZACTAM) 1 g in dextrose 5 % 50 mL IVPB     1 g 100 mL/hr over 30 Minutes Intravenous  Once 07/02/15 0346 07/02/15 0418      Assessment & Plans: Status post partial hepatectomy  Perc drain today.  Continue antibiotics.  Pyelonephritis  Management per medical team  [facility] Office: [place] 07/05/2015

## 2015-07-05 NOTE — Consult Note (Signed)
Chief Complaint: Patient was seen in consultation today for image guided drainage of right perihepatic fluid collection  Chief Complaint  Patient presents with  . Elevated WBC     Referring Physician(s): [facility]  History of Present Illness: Destiny Velasquez is a 81 y.o. female with remote history of breast cancer, diverticulitis and prior biopsy of a right lobe liver lesion by IR on 05/04/15 which revealed poorly differentiated adenocarcinoma. Patient underwent a partial right hepatic lobectomy on 06/14/15 by Dr. Barry Dienes. She was discharged from the hospital on 06/23/15 and readmitted on 11/18 with persistently elevated WBC count, elevated creatinine, dysphagia, and UTI. Patient also complained of constipation, weight loss, nausea and abdominal pain as well as weakness. UTI was treated with IV Rocephin. Subsequent CT scan of the abdomen and pelvis on 11/21 revealed a 9 cm rim-enhancing fluid collection in the right hepatic lobe surgical bed and small perihepatic fluid collection adjacent to right hepatic lobe concerning for postop abscess or possibly biloma. There was also improving diverticulitis and a persistent 3.8 cm left lower quadrant fluid collection possibly of diverticular or ovarian cystic origin. Request now received from surgery for image guided drainage of the right perihepatic fluid collection.  Past Medical History  Diagnosis Date  . Hypertension   . Hyperlipidemia   . Thyroid disease   . Subacute delirium   . Essential and other specified forms of tremor   . Unspecified hypothyroidism   . Diverticulosis of colon (without mention of hemorrhage)   . Unspecified constipation   . Anal and rectal polyp   . Disorder of bone and cartilage, unspecified   . Pneumonia X 1  . GERD (gastroesophageal reflux disease)   . Malignant neoplasm of breast (female), unspecified site   . Breast cancer, right breast ([place]) 1971    "never had chemo or radiation"  . Liver cancer ([place]) 2016  .  Diverticulitis of large intestine without perforation or abscess without bleeding     Past Surgical History  Procedure Laterality Date  . Appendectomy    . Abdominal hysterectomy    . Tonsillectomy and adenoidectomy  1935  . Cholecystectomy    . Breast biopsy Right 1971  . Mastectomy Right 1971  . Tubal ligation    . Cataract extraction w/ intraocular lens  implant, bilateral Bilateral   . Rectal polypectomy  early 2000's  . Laparoscopy N/A 06/14/2015    Procedure: LAPAROSCOPY DIAGNOSTIC;  Surgeon: [name], MD;  Location: [facility];  Service: General;  Laterality: N/A;  . Open partial hepatectomy [83] N/A 06/14/2015    Procedure:  OPEN PARTIAL RIGHT HEPATECTOMY;  Surgeon: [name], MD;  Location: [facility];  Service: General;  Laterality: N/A;  converted to open @ 1447  . Liver biopsy  06/14/2015    Procedure: LIVER BIOPSY;  Surgeon: [name], MD;  Location: [facility];  Service: General;;    Allergies: Review of patient's allergies indicates no known allergies.  Medications: Prior to Admission medications   Medication Sig Start Date End Date Taking? Authorizing Provider  acetaminophen (TYLENOL) 325 MG tablet Take 1-2 tablets (325-650 mg total) by mouth every 6 (six) hours as needed for fever, headache, mild pain or moderate pain. 06/22/15  Yes [name], MD  alum & mag hydroxide-simeth (MAALOX/MYLANTA) 200-200-20 MG/5ML suspension Take 30 mLs by mouth every 6 (six) hours as needed for indigestion or heartburn (or bloating). 06/22/15  Yes [name], MD  aspirin 81 MG tablet Take 81 mg by mouth daily.  Yes Historical Provider, MD  ibuprofen (ADVIL,MOTRIN) 400 MG tablet Take 1-2 tablets (400-800 mg total) by mouth every 12 (twelve) hours as needed for fever or headache. 06/22/15  Yes [name], MD  levothyroxine (SYNTHROID, LEVOTHROID) 100 MCG tablet TAKE ONE TABLET BY MOUTH 30 MINUTES BEFORE BREAKFAST EVERY MORNING. SEPARATE FROM OTHER MEDICATIONS FOR THYROID. 12/20/14  Yes  [name] L [name], DO  LORazepam (ATIVAN) 0.5 MG tablet Take 1 tablet (0.5 mg total) by mouth every 6 (six) hours as needed for anxiety. Patient taking differently: Take 0.5 mg by mouth every 6 (six) hours as needed for anxiety. Pt does take in prn through out the day but takes it every night at 9pm 05/09/15  Yes [name] L [name], DO  losartan (COZAAR) 50 MG tablet Take one tablet by mouth once daily for blood pressure Patient taking differently: Take 50 mg by mouth daily. Take one tablet by mouth once daily for blood pressure 02/28/15  Yes [name] L [name], DO  ondansetron (ZOFRAN) 4 MG tablet Take 1 tablet (4 mg total) by mouth every 8 (eight) hours as needed for nausea or vomiting. 05/09/15  Yes [name] L [name], DO  OVER THE COUNTER MEDICATION Take 1 Container by mouth 3 (three) times daily. Med pass   Yes Historical Provider, MD  polyethylene glycol (MIRALAX / GLYCOLAX) packet Take 17 g by mouth daily.    Yes Historical Provider, MD  Probiotic Product (PROBIOTIC DAILY PO) Take 1 tablet by mouth daily.    Yes Historical Provider, MD  simethicone (MYLICON) 834 MG chewable tablet Chew 125 mg by mouth 3 (three) times daily.   Yes Historical Provider, MD  spironolactone-hydrochlorothiazide (ALDACTAZIDE) 25-25 MG per tablet TAKE 1/2 TABLET BY MOUTH ONCE DAILY 04/11/15  Yes [name] L [name], DO  estrogens, conjugated, (PREMARIN) 0.3 MG tablet Take one tablet every other day for one month Patient not taking: Reported on 07/01/2015 03/18/14   [name] L [name], DO  feeding supplement (BOOST / RESOURCE BREEZE) LIQD Take 1 Container by mouth 3 (three) times daily between meals. Patient not taking: Reported on 06/08/2015 05/06/15   Flonnie Overman Dhungel, MD  HYDROcodone-acetaminophen (NORCO/VICODIN) 5-325 MG tablet Take 1 tablet by mouth every 6 (six) hours as needed for moderate pain. 07/04/15   Maryann Mikhail, DO  levofloxacin (LEVAQUIN) 500 MG tablet Take 1 tablet (500 mg total) by mouth daily. Patient not taking: Reported on  07/01/2015 06/23/15   [name], MD  shark liver oil-cocoa butter (PREPARATION H) 0.25-3-85.5 % suppository Place 1 suppository rectally as needed for hemorrhoids.    Historical Provider, MD  simethicone (MYLICON) 80 MG chewable tablet Chew 1 tablet (80 mg total) by mouth 4 (four) times daily as needed for flatulence. Patient not taking: Reported on 07/01/2015 06/22/15   [name], MD  simvastatin (ZOCOR) 40 MG tablet Take 1 tablet (40 mg total) by mouth every other day. Patient not taking: Reported on 07/01/2015 04/14/15   [name] L [name], DO  traMADol (ULTRAM) 50 MG tablet Take 1 tablet (50 mg total) by mouth every 6 (six) hours as needed for moderate pain or severe pain. 07/04/15   Cristal Ford, DO     Family History  Problem Relation Age of Onset  . Cancer Mother   . Hypertension Father     Social History   Social History  . Marital Status: Married    Spouse Name: N/A  . Number of Children: N/A  . Years of Education: N/A   Social History Main Topics  .  Smoking status: Former Smoker -- 0.50 packs/day for 40 years    Types: Cigarettes    Quit date: 08/13/2012  . Smokeless tobacco: Never Used     Comment: "quit smoking cigarettes years ago; I was probably in my 11's"  . Alcohol Use: 3.0 oz/week    5 Glasses of wine per week     Comment: once per month   . Drug Use: No  . Sexual Activity: No   Other Topics Concern  . None   Social History Narrative      Review of Systems see above  Vital Signs: BP 142/60 mmHg  Pulse 84  Temp(Src) 98.2 F (36.8 C) (Oral)  Resp 18  Ht '5\' 3"'$  (1.6 m)  Wt 138 lb 9.6 oz (62.869 kg)  BMI 24.56 kg/m2  SpO2 98%  Physical Exam patient awake, alert, slightly drowsy; chest with slightly diminished breath sounds at bases; heart with regular rate and rhythm; abdomen soft, mild right upper quadrant/epigastric tenderness to palpation, positive bowel sounds; extremities with no edema  Mallampati Score:     Imaging: Dg Chest 2  View  07/02/2015  CLINICAL DATA:  Right upper quadrant pain. Cough with swallowing. Leukocytosis. EXAM: CHEST  2 VIEW COMPARISON:  PET-CT 05/17/2015 FINDINGS: Heart at the upper limits of normal in size, atherosclerosis of the thoracic aorta. Linear opacity in the right perihilar lung consistent with atelectasis. No consolidation to suggest pneumonia. Pulmonary vasculature is normal. No consolidation, pleural effusion, or pneumothorax. No acute osseous abnormalities are seen. IMPRESSION: Linear right perihilar atelectasis.  No pneumonia. Electronically Signed   By: Jeb Levering M.D.   On: 07/02/2015 00:47   Ct Head Wo Contrast  06/16/2015  CLINICAL DATA:  Mild facial droop, no other neurological deficits EXAM: CT HEAD WITHOUT CONTRAST TECHNIQUE: Contiguous axial images were obtained from the base of the skull through the vertex without intravenous contrast. COMPARISON:  None. FINDINGS: Diffuse age-related cortical atrophy as well as diffuse low attenuation in the deep white matter. There is no evidence of vascular territory cortical infarct. There is no evidence of mass. There is no hemorrhage or extra-axial fluid. There is no hydrocephalus. Sub cm focus of low attenuation left pons. Sub cm calcified meningioma right frontal region. Calvarium intact. No significant inflammatory change in the visualized portions of the paranasal sinuses. IMPRESSION: Chronic involutional change with no hemorrhage or vascular territory infarct. Tiny focus of low attenuation left pons measuring a few mm may reflect another focus of chronic white matter degeneration versus tiny lacunar infarct of uncertain age. This could be better evaluated with MRI if indicated. Electronically Signed   By: Skipper Cliche M.D.   On: 06/16/2015 08:38   Mr Jeri Cos JO Contrast  06/22/2015  CLINICAL DATA:  Abnormal CT scan of the head. EXAM: MRI HEAD WITHOUT AND WITH CONTRAST TECHNIQUE: Multiplanar, multiecho pulse sequences of the brain and  surrounding structures were obtained without and with intravenous contrast. CONTRAST:  26m MULTIHANCE GADOBENATE DIMEGLUMINE 529 MG/ML IV SOLN COMPARISON:  CT head without contrast FINDINGS: Moderate generalized atrophy and white matter changes are present bilaterally. No acute infarct, hemorrhage, or mass lesion is present. The ventricles are of normal size. The ventricles are proportionate to the degree of atrophy. No significant extra-axial fluid collection is present. Is brainstem and cerebellum are within normal limits. Flow is present in the major intracranial arteries. Bilateral lens replacements are present. The globes and orbits are otherwise intact. A single posterior right ethmoid air cell is opacified. The  remaining paranasal sinuses and mastoid air cells are clear. Postcontrast images demonstrate no pathologic enhancement. IMPRESSION: 1. Moderate atrophy and white matter disease. This likely reflects the sequela of chronic microvascular ischemia. 2. No acute infarct. Electronically Signed   By: San Morelle M.D.   On: 06/22/2015 19:50   Ct Abdomen Pelvis W Contrast  07/04/2015  CLINICAL DATA:  Three weeks status post resection of hepatic adenocarcinoma. Nausea. Persistent leukocytosis. EXAM: CT ABDOMEN AND PELVIS WITH CONTRAST TECHNIQUE: Multidetector CT imaging of the abdomen and pelvis was performed using the standard protocol following bolus administration of intravenous contrast. CONTRAST:  136m OMNIPAQUE IOHEXOL 300 MG/ML  SOLN COMPARISON:  05/03/2015 FINDINGS: Lower chest: Small bilateral pleural effusions noted as well as tiny pericardial effusion. Mild wall thickening of distal thoracic esophagus is suspicious for esophagitis. Hepatobiliary: There has been interval resection of previously seen right hepatic lobe mass. There is an irregular rim enhancing fluid collection within the surgical bed in the right hepatic lobe which measures 8.1 x 8.8 cm on image 32/series 3. This could be  due to postop fluid collection or abscess. No other liver lesions are identified. A small rim enhancing perihepatic fluid collection is also seen adjacent to the anterior right hepatic lobe which measures 1.8 x 7.8 cm on image 26/ series 3. This could also represent postop fluid collection or abscess. Prior cholecystectomy again noted. Dilatation of extrahepatic common bile duct remains stable. Pancreas: No mass, inflammatory changes, or other significant abnormality. Spleen: Within normal limits in size and appearance. Adrenals/Urinary Tract: No masses identified. No evidence of hydronephrosis. Mild diffuse bladder wall thickening noted as well as mild dilatation, possibly due to neurogenic bladder. Stomach/Bowel: Diverticulitis near the junction of the descending and sigmoid colon shows improvement since previous study. Vascular/Lymphatic: No pathologically enlarged lymph nodes. No evidence of abdominal aortic aneurysm. Reproductive: Previous hysterectomy a 3.8 cm cyst with a tiny posterior mural calcification is seen in the left adnexa on image 96 98/series 3 which is not significant changed since previous study. No definite malignant features identified. This could represent a diverticular abscess or cystic lesion of left ovary. Other: Small amount of free fluid in pelvic cul-de-sac. Musculoskeletal: No suspicious bone lesions identified. Spondylosis grade 2 anterolisthesis noted at L4-5. IMPRESSION: 9 cm rim enhancing fluid collection in right hepatic lobe surgical bed, and smaller perihepatic fluid collection adjacent to right lobe. Differential diagnosis includes postop fluid collections and abscess. Improving diverticulitis. Persistent 3.8 cm left lower quadrant fluid collection, which could represent a diverticular abscess or left ovarian cystic lesion. No malignant features identified. Recommend continued attention on follow-up imaging. Small bilateral pleural effusions, tiny pericardial effusion, and  small amount free fluid in pelvic cul-de-sac. Distal esophageal wall thickening, suspicious for esophagitis. Mildly distended urinary bladder with diffuse wall thickening, suspicious for neurogenic bladder. Electronically Signed   By: JEarle GellM.D.   On: 07/04/2015 15:15    Labs:  CBC:  Recent Labs  07/02/15 0610 07/03/15 0443 07/04/15 0444 07/05/15 0415  WBC 12.1* 11.7* 9.3 14.5*  HGB 11.6* 11.0* 10.6* 11.0*  HCT 35.2* 33.3* 31.8* 33.5*  PLT 356 347 243 302    COAGS:  Recent Labs  05/04/15 1223 06/13/15 0830 06/15/15 0510 07/05/15 0415  INR 1.20 1.08  --  1.13  APTT  --   --  28  --     BMP:  Recent Labs  07/02/15 0610 07/03/15 0443 07/04/15 0444 07/05/15 0415  NA 133* 135 135 135  K 3.8 3.8  3.6 3.5  CL 99* 103 106 106  CO2 '25 24 26 23  '$ GLUCOSE 122* 106* 115* 112*  BUN 35* 24* 14 9  CALCIUM 10.4* 9.7 9.1 9.0  CREATININE 1.58* 1.25* 0.89 0.74  GFRNONAA 28* 38* 57* >60  GFRAA 33* 44* >60 >60    LIVER FUNCTION TESTS:  Recent Labs  06/21/15 0810 06/22/15 0343 06/23/15 0850 07/02/15 0026  BILITOT 1.3* 1.3* 1.1 0.6  AST 35 '21 22 24  '$ ALT 74* 55* 45 17  ALKPHOS 101 89 90 84  PROT 6.6 6.2* 6.6 6.9  ALBUMIN 3.5 3.3* 3.1* 3.0*    TUMOR MARKERS:  Recent Labs  05/18/15 1019  AFPTM 6.5*  CEA <0.5  CA199 175.4*    Assessment and Plan: Patient with recent partial right hepatic lobectomy on 06/14/15 secondary to poorly differentiated adenocarcinoma; now with abdominal pain, weight loss, nausea, weakness , leukocytosis, elevated creatinine, UTI and finding of fluid collection in right hepatic lobe surgical bed concerning for abscess versus biloma. Request now received for image guided drainage of the right upper quadrant fluid collection. Imaging studies have been reviewed by Dr. Earleen Newport. Details /risks of procedure, including not limited to, internal bleeding, infection, injury to adjacent organs, need for prolonged drainage, discussed with patient and  family with their understanding and consent. Procedure tentatively scheduled for later today.   Thank you for this interesting consult.  I greatly enjoyed meeting Destiny Velasquez and look forward to participating in their care.  A copy of this report was sent to the requesting provider on this date.  Signed: D. Rowe Robert 07/05/2015, 9:39 AM   I spent a total of 20 minutes in face to face in clinical consultation, greater than 50% of which was counseling/coordinating care for image guided drainage of right upper quadrant fluid collection

## 2015-07-05 NOTE — Progress Notes (Signed)
[facility]                                                                              Patient Demographics  Destiny Velasquez, is a 81 y.o. female, DOB - Oct 16, 1925, OEV:[id number]  Admit date - 07/01/2015   Admitting Physician Theressa Millard, MD  Outpatient Primary MD for the patient is [name], [place], DO  LOS - 3   Chief Complaint  Patient presents with  . Elevated WBC       HPI on 07/02/2015 by Dr. Jana Hakim Destiny Velasquez is a 81 y.o. female with history of Liver Adenocarcinoma S/P resection on 06/15/2015 currently in the [facility] for Rehab Rx who was sent to the ED after laboratory results revealed a persistent Elevated WBC despite 10 days of antibiotic rx for a UTI with Levaquin. She was evaluated in the ED and also found to have an elevated BUN/Cr of 41/1.82. A repeat Urine Culture was sent and she was placed on IV Azactam and referred for admission.  One of her daughters is at bedside and reports that she has observed her to have dysphagia x 2 -3 days, and difficulty swallowing her medications  Interim history Patient being treated for UTI, which was resistant to cipro (levaquin given as an outpatient).  CT abd showed 9cm fluid collection, ?abscess.  Gen surgery consulted and asked IR to place drain- pending today.  Assessment & Plan   Urinary tract infection with leukocytosis -UA on 06/22/2015 was positive for infection however no urine culture done at that time. Patient was placed on Levaquin -UA 07/02/2015 shows WBC TNTC, large leukocytes, many bacteria -Urine culture: Ecoli mainly pan sensitive (resitant to ampicillin and cipro) -Blood cultures show no growth -WBC trending upward today, 14.   -Will place on IV zosyn.  Right hepatic lobe 9cm fluid collection -Seen on CT scan -?Abscess vs postop fluid collections -Gen surgery consulted and appreciated, IR consulted for drain placement -Continue zosyn  Acute on chronic kidney injury, stage  III -Likely secondary to dehydration -Creatinine on admission 1.82, currently 0.74 -Continue to monitor BMP  Dysphagia -Speech therapy consultation appreciated, recommended regular think diet, medication: crushed with puree  Hyponatremia -Resolved, Sodium currently 135, continue monitor BMP -TSH 03/30/2015 1.6 -Continue IVF  Hypothyroidism -Continue Synthroid -TSH 03/30/2015 1.6  Essential hypertension -Blood pressure currently stable -Losartan and spironolatone/HCTZ held  Hyperlipidemia -Patient no longer taking statin, should follow up with her primary care physician   Adenocarcinoma liver -Status post hepatic lobectomy 06/14/2015 -Stable  -General surgery consulted and appreciated  Physical deconditioning -PT rec SNF  Code Status: Full  Family Communication: Daughters at bedside  Disposition Plan: Admitted, Pending IR for drain placement and SNF  Time Spent in minutes 30 minutes  Procedures  None  Consults  General surgery Interventional radiology   DVT Prophylaxis Lovenox  Lab Results  Component Value Date   PLT 302 07/05/2015    Medications  Scheduled Meds: . antiseptic oral rinse  7 mL Mouth Rinse BID  . aspirin  81 mg Oral Daily  . [START ON 07/06/2015] enoxaparin (LOVENOX) injection  40 mg Subcutaneous Q24H  . levothyroxine  100 mcg Oral QAC breakfast  .  piperacillin-tazobactam (ZOSYN)  IV  3.375 g Intravenous Q8H   Continuous Infusions: . sodium chloride 1,000 mL (07/05/15 0819)   PRN Meds:.acetaminophen **OR** acetaminophen, alum & mag hydroxide-simeth, HYDROmorphone (DILAUDID) injection, LORazepam, ondansetron **OR** ondansetron (ZOFRAN) IV, oxyCODONE  Antibiotics    Anti-infectives    Start     Dose/Rate Route Frequency Ordered Stop   07/05/15 0815  piperacillin-tazobactam (ZOSYN) IVPB 3.375 g     3.375 g 12.5 mL/hr over 240 Minutes Intravenous Every 8 hours 07/05/15 0813     07/04/15 2000  cefTRIAXone (ROCEPHIN) 1 g in  dextrose 5 % 50 mL IVPB  Status:  Discontinued     1 g 100 mL/hr over 30 Minutes Intravenous Every 24 hours 07/04/15 1255 07/05/15 0805   07/02/15 1200  aztreonam (AZACTAM) 500 mg in dextrose 5 % 50 mL IVPB  Status:  Discontinued     500 mg 100 mL/hr over 30 Minutes Intravenous Every 8 hours 07/02/15 0545 07/04/15 1255   07/02/15 0400  aztreonam (AZACTAM) 1 g in dextrose 5 % 50 mL IVPB     1 g 100 mL/hr over 30 Minutes Intravenous  Once 07/02/15 0346 07/02/15 0418      Subjective:   Destiny Velasquez seen and examined today.  Patient has no complaints this morning but wants to know when the drain will be placed.  Denies chest pain, shortness of breath, abdominal pain, flank pain.  Objective:   Filed Vitals:   07/04/15 1336 07/04/15 2030 07/04/15 2056 07/05/15 0519  BP: 149/61  140/53 142/60  Pulse: 71 78 88 84  Temp: 97.8 F (36.6 C)  98.3 F (36.8 C) 98.2 F (36.8 C)  TempSrc: Oral  Oral Oral  Resp: '18  20 18  '$ Height:      Weight:      SpO2: 98%  98% 98%    Wt Readings from Last 3 Encounters:  07/02/15 62.869 kg (138 lb 9.6 oz)  06/14/15 65.318 kg (144 lb)  06/22/15 65.318 kg (144 lb)     Intake/Output Summary (Last 24 hours) at 07/05/15 1156 Last data filed at 07/05/15 6761  Gross per 24 hour  Intake 4826.25 ml  Output      0 ml  Net 4826.25 ml    Exam  General: Well developed, well nourished, NAD  HEENT: NCAT,  mucous membranes moist.   Cardiovascular: S1 S2 auscultated, RRR, no murmurs  Respiratory: Clear to auscultation   Abdomen: Soft, nontender, nondistended, + bowel sounds  Extremities: warm dry without cyanosis clubbing or edema  Neuro: AAOx3, nonfocal  Psych: Normal affect and demeanor, somewhat irritable today  Data Review   Micro Results Recent Results (from the past 240 hour(s))  Blood Culture (routine x 2)     Status: None (Preliminary result)   Collection Time: 07/02/15 12:20 AM  Result Value Ref Range Status   Specimen Description  BLOOD LEFT ANTECUBITAL  Final   Special Requests BOTTLES DRAWN AEROBIC AND ANAEROBIC 5ML  Final   Culture   Final    NO GROWTH 3 DAYS Performed at [facility]    Report Status PENDING  Incomplete  Blood Culture (routine x 2)     Status: None (Preliminary result)   Collection Time: 07/02/15 12:27 AM  Result Value Ref Range Status   Specimen Description BLOOD LEFT HAND  Final   Special Requests IN PEDIATRIC BOTTLE 5CC  Final   Culture   Final    NO GROWTH 3 DAYS Performed at [facility]    Report Status PENDING  Incomplete  Urine culture     Status: None   Collection Time: 07/02/15  1:41 AM  Result Value Ref Range Status   Specimen Description URINE, CLEAN CATCH  Final   Special Requests NONE  Final   Culture   Final    >=100,000 COLONIES/mL ESCHERICHIA COLI Performed at [facility]    Report Status 07/04/2015 FINAL  Final   Organism ID, Bacteria ESCHERICHIA COLI  Final      Susceptibility   Escherichia coli - MIC*    AMPICILLIN >=32 RESISTANT Resistant     CEFAZOLIN <=4 SENSITIVE Sensitive     CEFTRIAXONE <=1 SENSITIVE Sensitive     CIPROFLOXACIN >=4 RESISTANT Resistant     GENTAMICIN <=1 SENSITIVE Sensitive     IMIPENEM 0.5 SENSITIVE Sensitive     NITROFURANTOIN <=16 SENSITIVE Sensitive     TRIMETH/SULFA <=20 SENSITIVE Sensitive     AMPICILLIN/SULBACTAM 16 INTERMEDIATE Intermediate     PIP/TAZO 8 SENSITIVE Sensitive     * >=100,000 COLONIES/mL ESCHERICHIA COLI    Radiology Reports Dg Chest 2 View  07/02/2015  CLINICAL DATA:  Right upper quadrant pain. Cough with swallowing. Leukocytosis. EXAM: CHEST  2 VIEW COMPARISON:  PET-CT 05/17/2015 FINDINGS: Heart at the upper limits of normal in size, atherosclerosis of the thoracic aorta. Linear opacity in the right perihilar lung consistent with atelectasis. No consolidation to suggest pneumonia. Pulmonary vasculature is normal. No consolidation, pleural effusion, or pneumothorax. No acute osseous  abnormalities are seen. IMPRESSION: Linear right perihilar atelectasis.  No pneumonia. Electronically Signed   By: Jeb Levering M.D.   On: 07/02/2015 00:47   Ct Head Wo Contrast  06/16/2015  CLINICAL DATA:  Mild facial droop, no other neurological deficits EXAM: CT HEAD WITHOUT CONTRAST TECHNIQUE: Contiguous axial images were obtained from the base of the skull through the vertex without intravenous contrast. COMPARISON:  None. FINDINGS: Diffuse age-related cortical atrophy as well as diffuse low attenuation in the deep white matter. There is no evidence of vascular territory cortical infarct. There is no evidence of mass. There is no hemorrhage or extra-axial fluid. There is no hydrocephalus. Sub cm focus of low attenuation left pons. Sub cm calcified meningioma right frontal region. Calvarium intact. No significant inflammatory change in the visualized portions of the paranasal sinuses. IMPRESSION: Chronic involutional change with no hemorrhage or vascular territory infarct. Tiny focus of low attenuation left pons measuring a few mm may reflect another focus of chronic white matter degeneration versus tiny lacunar infarct of uncertain age. This could be better evaluated with MRI if indicated. Electronically Signed   By: Skipper Cliche M.D.   On: 06/16/2015 08:38   Mr Jeri Cos HE Contrast  06/22/2015  CLINICAL DATA:  Abnormal CT scan of the head. EXAM: MRI HEAD WITHOUT AND WITH CONTRAST TECHNIQUE: Multiplanar, multiecho pulse sequences of the brain and surrounding structures were obtained without and with intravenous contrast. CONTRAST:  71m MULTIHANCE GADOBENATE DIMEGLUMINE 529 MG/ML IV SOLN COMPARISON:  CT head without contrast FINDINGS: Moderate generalized atrophy and white matter changes are present bilaterally. No acute infarct, hemorrhage, or mass lesion is present. The ventricles are of normal size. The ventricles are proportionate to the degree of atrophy. No significant extra-axial fluid  collection is present. Is brainstem and cerebellum are within normal limits. Flow is present in the major intracranial arteries. Bilateral lens replacements are present. The globes and orbits are otherwise intact. A single posterior right ethmoid air cell  is opacified. The remaining paranasal sinuses and mastoid air cells are clear. Postcontrast images demonstrate no pathologic enhancement. IMPRESSION: 1. Moderate atrophy and white matter disease. This likely reflects the sequela of chronic microvascular ischemia. 2. No acute infarct. Electronically Signed   By: San Morelle M.D.   On: 06/22/2015 19:50   Ct Abdomen Pelvis W Contrast  07/04/2015  CLINICAL DATA:  Three weeks status post resection of hepatic adenocarcinoma. Nausea. Persistent leukocytosis. EXAM: CT ABDOMEN AND PELVIS WITH CONTRAST TECHNIQUE: Multidetector CT imaging of the abdomen and pelvis was performed using the standard protocol following bolus administration of intravenous contrast. CONTRAST:  137m OMNIPAQUE IOHEXOL 300 MG/ML  SOLN COMPARISON:  05/03/2015 FINDINGS: Lower chest: Small bilateral pleural effusions noted as well as tiny pericardial effusion. Mild wall thickening of distal thoracic esophagus is suspicious for esophagitis. Hepatobiliary: There has been interval resection of previously seen right hepatic lobe mass. There is an irregular rim enhancing fluid collection within the surgical bed in the right hepatic lobe which measures 8.1 x 8.8 cm on image 32/series 3. This could be due to postop fluid collection or abscess. No other liver lesions are identified. A small rim enhancing perihepatic fluid collection is also seen adjacent to the anterior right hepatic lobe which measures 1.8 x 7.8 cm on image 26/ series 3. This could also represent postop fluid collection or abscess. Prior cholecystectomy again noted. Dilatation of extrahepatic common bile duct remains stable. Pancreas: No mass, inflammatory changes, or other  significant abnormality. Spleen: Within normal limits in size and appearance. Adrenals/Urinary Tract: No masses identified. No evidence of hydronephrosis. Mild diffuse bladder wall thickening noted as well as mild dilatation, possibly due to neurogenic bladder. Stomach/Bowel: Diverticulitis near the junction of the descending and sigmoid colon shows improvement since previous study. Vascular/Lymphatic: No pathologically enlarged lymph nodes. No evidence of abdominal aortic aneurysm. Reproductive: Previous hysterectomy a 3.8 cm cyst with a tiny posterior mural calcification is seen in the left adnexa on image 96 98/series 3 which is not significant changed since previous study. No definite malignant features identified. This could represent a diverticular abscess or cystic lesion of left ovary. Other: Small amount of free fluid in pelvic cul-de-sac. Musculoskeletal: No suspicious bone lesions identified. Spondylosis grade 2 anterolisthesis noted at L4-5. IMPRESSION: 9 cm rim enhancing fluid collection in right hepatic lobe surgical bed, and smaller perihepatic fluid collection adjacent to right lobe. Differential diagnosis includes postop fluid collections and abscess. Improving diverticulitis. Persistent 3.8 cm left lower quadrant fluid collection, which could represent a diverticular abscess or left ovarian cystic lesion. No malignant features identified. Recommend continued attention on follow-up imaging. Small bilateral pleural effusions, tiny pericardial effusion, and small amount free fluid in pelvic cul-de-sac. Distal esophageal wall thickening, suspicious for esophagitis. Mildly distended urinary bladder with diffuse wall thickening, suspicious for neurogenic bladder. Electronically Signed   By: JEarle GellM.D.   On: 07/04/2015 15:15    CBC  Recent Labs Lab 07/02/15 0026 07/02/15 0610 07/03/15 0443 07/04/15 0444 07/05/15 0415  WBC 14.5* 12.1* 11.7* 9.3 14.5*  HGB 11.4* 11.6* 11.0* 10.6* 11.0*   HCT 33.9* 35.2* 33.3* 31.8* 33.5*  PLT 386 356 347 243 302  MCV 84.5 86.1 86.3 85.9 85.9  MCH 28.4 28.4 28.5 28.6 28.2  MCHC 33.6 33.0 33.0 33.3 32.8  RDW 13.4 13.7 13.8 13.7 13.8  LYMPHSABS 1.7  --   --   --   --   MONOABS 1.4*  --   --   --   --  EOSABS 0.6  --   --   --   --   BASOSABS 0.1  --   --   --   --     Chemistries   Recent Labs Lab 07/02/15 0026 07/02/15 0610 07/03/15 0443 07/04/15 0444 07/05/15 0415  NA 132* 133* 135 135 135  K 4.0 3.8 3.8 3.6 3.5  CL 97* 99* 103 106 106  CO2 '28 25 24 26 23  '$ GLUCOSE 144* 122* 106* 115* 112*  BUN 41* 35* 24* 14 9  CREATININE 1.82* 1.58* 1.25* 0.89 0.74  CALCIUM 11.0* 10.4* 9.7 9.1 9.0  AST 24  --   --   --   --   ALT 17  --   --   --   --   ALKPHOS 84  --   --   --   --   BILITOT 0.6  --   --   --   --    ------------------------------------------------------------------------------------------------------------------ estimated creatinine clearance is 44.3 mL/min (by C-G formula based on Cr of 0.74). ------------------------------------------------------------------------------------------------------------------ No results for input(s): HGBA1C in the last 72 hours. ------------------------------------------------------------------------------------------------------------------ No results for input(s): CHOL, HDL, LDLCALC, TRIG, CHOLHDL, LDLDIRECT in the last 72 hours. ------------------------------------------------------------------------------------------------------------------ No results for input(s): TSH, T4TOTAL, T3FREE, THYROIDAB in the last 72 hours.  Invalid input(s): FREET3 ------------------------------------------------------------------------------------------------------------------ No results for input(s): VITAMINB12, FOLATE, FERRITIN, TIBC, IRON, RETICCTPCT in the last 72 hours.  Coagulation profile  Recent Labs Lab 07/05/15 0415  INR 1.13    No results for input(s): DDIMER in the last 72  hours.  Cardiac Enzymes No results for input(s): CKMB, TROPONINI, MYOGLOBIN in the last 168 hours.  Invalid input(s): CK ------------------------------------------------------------------------------------------------------------------ Invalid input(s): POCBNP    Mada Sadik D.O. on 07/05/2015 at 11:56 AM  Between 7am to 7pm - Pager - [phone]  After 7pm go to [email or web address] - password [email or web address]  And look for the night coverage person covering for me after hours  [facility] Office  [phone]

## 2015-07-05 NOTE — Progress Notes (Signed)
ANTIBIOTIC CONSULT NOTE - INITIAL  Pharmacy Consult for Zosyn Indication: Intra-abdominal abscess  No Known Allergies  Patient Measurements: Height: '5\' 3"'$  (160 cm) Weight: 138 lb 9.6 oz (62.869 kg) IBW/kg (Calculated) : 52.4  Vital Signs: Temp: 98.2 F (36.8 C) (11/22 0519) Temp Source: Oral (11/22 0519) BP: 142/60 mmHg (11/22 0519) Pulse Rate: 84 (11/22 0519) Intake/Output from previous day: 11/21 0701 - 11/22 0700 In: 4826.3 [P.O.:70; I.V.:4756.3] Out: -  Intake/Output from this shift:    Labs:  Recent Labs  07/03/15 0443 07/04/15 0444 07/05/15 0415  WBC 11.7* 9.3 14.5*  HGB 11.0* 10.6* 11.0*  PLT 347 243 302  CREATININE 1.25* 0.89 0.74   Estimated Creatinine Clearance: 44.3 mL/min (by C-G formula based on Cr of 0.74). No results for input(s): VANCOTROUGH, VANCOPEAK, VANCORANDOM, GENTTROUGH, GENTPEAK, GENTRANDOM, TOBRATROUGH, TOBRAPEAK, TOBRARND, AMIKACINPEAK, AMIKACINTROU, AMIKACIN in the last 72 hours.   Microbiology: Recent Results (from the past 720 hour(s))  MRSA PCR Screening     Status: None   Collection Time: 06/14/15  6:41 PM  Result Value Ref Range Status   MRSA by PCR NEGATIVE NEGATIVE Final    Comment:        The GeneXpert MRSA Assay (FDA approved for NASAL specimens only), is one component of a comprehensive MRSA colonization surveillance program. It is not intended to diagnose MRSA infection nor to guide or monitor treatment for MRSA infections.   Blood Culture (routine x 2)     Status: None (Preliminary result)   Collection Time: 07/02/15 12:20 AM  Result Value Ref Range Status   Specimen Description BLOOD LEFT ANTECUBITAL  Final   Special Requests BOTTLES DRAWN AEROBIC AND ANAEROBIC 5ML  Final   Culture   Final    NO GROWTH 2 DAYS Performed at [facility]    Report Status PENDING  Incomplete  Blood Culture (routine x 2)     Status: None (Preliminary result)   Collection Time: 07/02/15 12:27 AM  Result Value Ref Range  Status   Specimen Description BLOOD LEFT HAND  Final   Special Requests IN PEDIATRIC BOTTLE 5CC  Final   Culture   Final    NO GROWTH 2 DAYS Performed at [facility]    Report Status PENDING  Incomplete  Urine culture     Status: None   Collection Time: 07/02/15  1:41 AM  Result Value Ref Range Status   Specimen Description URINE, CLEAN CATCH  Final   Special Requests NONE  Final   Culture   Final    >=100,000 COLONIES/mL ESCHERICHIA COLI Performed at [facility]    Report Status 07/04/2015 FINAL  Final   Organism ID, Bacteria ESCHERICHIA COLI  Final      Susceptibility   Escherichia coli - MIC*    AMPICILLIN >=32 RESISTANT Resistant     CEFAZOLIN <=4 SENSITIVE Sensitive     CEFTRIAXONE <=1 SENSITIVE Sensitive     CIPROFLOXACIN >=4 RESISTANT Resistant     GENTAMICIN <=1 SENSITIVE Sensitive     IMIPENEM 0.5 SENSITIVE Sensitive     NITROFURANTOIN <=16 SENSITIVE Sensitive     TRIMETH/SULFA <=20 SENSITIVE Sensitive     AMPICILLIN/SULBACTAM 16 INTERMEDIATE Intermediate     PIP/TAZO 8 SENSITIVE Sensitive     * >=100,000 COLONIES/mL ESCHERICHIA COLI    Medical History: Past Medical History  Diagnosis Date  . Hypertension   . Hyperlipidemia   . Thyroid disease   . Subacute delirium   . Essential and other specified forms  of tremor   . Unspecified hypothyroidism   . Diverticulosis of colon (without mention of hemorrhage)   . Unspecified constipation   . Anal and rectal polyp   . Disorder of bone and cartilage, unspecified   . Pneumonia X 1  . GERD (gastroesophageal reflux disease)   . Malignant neoplasm of breast (female), unspecified site   . Breast cancer, right breast ([place]) 1971    "never had chemo or radiation"  . Liver cancer ([place]) 2016  . Diverticulitis of large intestine without perforation or abscess without bleeding     Medications:  Anti-infectives    Start     Dose/Rate Route Frequency Ordered Stop   07/05/15 0815  piperacillin-tazobactam  (ZOSYN) IVPB 3.375 g     3.375 g 12.5 mL/hr over 240 Minutes Intravenous Every 8 hours 07/05/15 0813     07/04/15 2000  cefTRIAXone (ROCEPHIN) 1 g in dextrose 5 % 50 mL IVPB  Status:  Discontinued     1 g 100 mL/hr over 30 Minutes Intravenous Every 24 hours 07/04/15 1255 07/05/15 0805   07/02/15 1200  aztreonam (AZACTAM) 500 mg in dextrose 5 % 50 mL IVPB  Status:  Discontinued     500 mg 100 mL/hr over 30 Minutes Intravenous Every 8 hours 07/02/15 0545 07/04/15 1255   07/02/15 0400  aztreonam (AZACTAM) 1 g in dextrose 5 % 50 mL IVPB     1 g 100 mL/hr over 30 Minutes Intravenous  Once 07/02/15 0346 07/02/15 0418     Assessment: 81 y.o. female s/p R hepatic lobectomy 06/14/15 d/t adenocarcinoma.  Admitted on 11/18 for UTI and treated with aztreonam > CTX, but now with 9 cm fluid collection in hepatic surgical bed - postop fluid collection vs abscess.  MD would like to resume abx for possible abscess as WBC now elevated again.    Patient appeared to be improving on Aztreonam in terms of urinary symptoms and WBC, but unsure if fluid collection was developing during that time.    Given this uncertainty, will opt for empiric abdominal coverage with Zosyn at this point  Hopefully will get cultures from fluid collection with IR drain placement today.  11/19 >>aztreonam >> 11/21 11/21 >> CTX >> 11/22 11/22 >> Zosyn >>  11/19: blood x 2: NGTD 11/19: urine: => 100,000 colonies/mL E.coli (sens: cefazolin, ceftriaxone, gent, imipenem, NTF, P/T, T/S)  Temp: afebrile since admission WBC: previously had resolved to WNL, now elevated again Renal: AKI resolved; CrCl 44 CG  Goal of Therapy:  Eradication of infection Appropriate antibiotic dosing for indication and renal function  Plan:  Day 4 antibiotics  Begin Zosyn 3.375 g IV given every 8 hrs by 4-hr infusion  Follow clinical course, renal function, culture results as available  Follow for de-escalation of antibiotics and LOT   Reuel Boom, PharmD, BCPS Pager: [phone] 07/05/2015, 10:42 AM

## 2015-07-05 NOTE — Care Management Important Message (Signed)
Important Message  Patient Details  Name: Destiny Velasquez MRN: [id number] Date of Birth: [birth year]   Medicare Important Message Given:  Yes    Camillo Flaming 07/05/2015, [place] Message  Patient Details  Name: Destiny Velasquez MRN: [id number] Date of Birth: 11-21-1925   Medicare Important Message Given:  Yes    Camillo Flaming 07/05/2015, 11:21 AM

## 2015-07-05 NOTE — Procedures (Signed)
Interventional Radiology Procedure Note  Procedure: Placement of a transhepatic 74F drain into biloma of right abdomen.  30cc aspirated for sample.  Gravity drain.  Complications: None Recommendations:  - Ok to shower tomorrow - Do not submerge  - Routine line care  - follow up cultures  Signed,  Dulcy Fanny. Earleen Newport, DO

## 2015-07-05 NOTE — Progress Notes (Signed)
Patient is back from IR, drain to R side inplace, draining through gravity., noted yellowish tinged drainge. Patient is sleepy with meds. Will continue to monitor.

## 2015-07-05 NOTE — Progress Notes (Signed)
CSW continuing to follow.   Pt planned for perc drain placement today.   CSW spoke with pt daughter today who stated that she did not want to continue bed hold at [facility] since it is anticipated that pt will be in the hospital for a few more days. CSW expressed understanding and discussed with pt daughter that if [facility] does not have a bed available when pt medically ready then alternative SNF will have to be chosen. Pt daughter expressed understanding and states that family will choose alternative SNF or pay for private care at home. CSW discussed with pt daughter that CSW will re-initiate SNF search following perc drain placement to ensure bed offers for SNF are accurate and can meet pt needs.   CSW notified [facility] that pt daughter did not want to continue to hold bed. [facility] stated that CSW can check with facility when pt medically ready about bed availability, but cannot guarantee that a bed will be available.   CSW to continue to follow to assist with pt disposition needs.   Alison Murray, MSW, [place] [facility] [phone]

## 2015-07-06 DIAGNOSIS — E079 Disorder of thyroid, unspecified: Secondary | ICD-10-CM

## 2015-07-06 DIAGNOSIS — E785 Hyperlipidemia, unspecified: Secondary | ICD-10-CM

## 2015-07-06 DIAGNOSIS — C229 Malignant neoplasm of liver, not specified as primary or secondary: Secondary | ICD-10-CM

## 2015-07-06 DIAGNOSIS — D72829 Elevated white blood cell count, unspecified: Secondary | ICD-10-CM

## 2015-07-06 DIAGNOSIS — IMO0002 Reserved for concepts with insufficient information to code with codable children: Secondary | ICD-10-CM | POA: Diagnosis present

## 2015-07-06 DIAGNOSIS — E871 Hypo-osmolality and hyponatremia: Secondary | ICD-10-CM

## 2015-07-06 DIAGNOSIS — R1310 Dysphagia, unspecified: Secondary | ICD-10-CM

## 2015-07-06 DIAGNOSIS — N189 Chronic kidney disease, unspecified: Secondary | ICD-10-CM

## 2015-07-06 DIAGNOSIS — N179 Acute kidney failure, unspecified: Secondary | ICD-10-CM

## 2015-07-06 DIAGNOSIS — L0291 Cutaneous abscess, unspecified: Secondary | ICD-10-CM

## 2015-07-06 DIAGNOSIS — N390 Urinary tract infection, site not specified: Secondary | ICD-10-CM

## 2015-07-06 DIAGNOSIS — I10 Essential (primary) hypertension: Secondary | ICD-10-CM

## 2015-07-06 DIAGNOSIS — N12 Tubulo-interstitial nephritis, not specified as acute or chronic: Principal | ICD-10-CM

## 2015-07-06 LAB — BASIC METABOLIC PANEL
Anion gap: 5 (ref 5–15)
BUN: 8 mg/dL (ref 6–20)
CALCIUM: 8.7 mg/dL — AB (ref 8.9–10.3)
CHLORIDE: 110 mmol/L (ref 101–111)
CO2: 24 mmol/L (ref 22–32)
CREATININE: 0.87 mg/dL (ref 0.44–1.00)
GFR calc non Af Amer: 58 mL/min — ABNORMAL LOW (ref >60)
Glucose, Bld: 109 mg/dL — ABNORMAL HIGH (ref 65–99)
Potassium: 3.2 mmol/L — ABNORMAL LOW (ref 3.5–5.1)
SODIUM: 139 mmol/L (ref 135–145)

## 2015-07-06 LAB — CBC
HCT: 32.6 % — ABNORMAL LOW (ref 36.0–46.0)
HEMOGLOBIN: 10.7 g/dL — AB (ref 12.0–15.0)
MCH: 28.2 pg (ref 26.0–34.0)
MCHC: 32.8 g/dL (ref 30.0–36.0)
MCV: 86 fL (ref 78.0–100.0)
Platelets: 269 10*3/uL (ref 150–400)
RBC: 3.79 MIL/uL — ABNORMAL LOW (ref 3.87–5.11)
RDW: 13.8 % (ref 11.5–15.5)
WBC: 13.3 10*3/uL — ABNORMAL HIGH (ref 4.0–10.5)

## 2015-07-06 MED ORDER — LORAZEPAM 2 MG/ML IJ SOLN: 0.5000 mg | Freq: Once | INTRAMUSCULAR | Status: DC

## 2015-07-06 MED ORDER — TRAZODONE HCL 50 MG PO TABS
25.0000 mg | ORAL_TABLET | Freq: Every day | ORAL | Status: DC
  Filled 2015-07-06: qty 1

## 2015-07-06 MED ORDER — SODIUM CHLORIDE 0.9 % IV SOLN
INTRAVENOUS | Status: DC
  Administered 2015-07-07 – 2015-07-09 (×2): via INTRAVENOUS

## 2015-07-06 MED ORDER — TRACE MINERALS CR-CU-MN-SE-ZN 10-1000-500-60 MCG/ML IV SOLN
INTRAVENOUS | Status: AC
  Administered 2015-07-06: 17:00:00 via INTRAVENOUS
  Filled 2015-07-06: qty 960

## 2015-07-06 MED ORDER — POTASSIUM CHLORIDE CRYS ER 20 MEQ PO TBCR: 40.0000 meq | EXTENDED_RELEASE_TABLET | Freq: Four times a day (QID) | ORAL | Status: DC

## 2015-07-06 MED ORDER — POTASSIUM CHLORIDE 10 MEQ/100ML IV SOLN
10.0000 meq | INTRAVENOUS | Status: AC
  Administered 2015-07-06 – 2015-07-07 (×3): 10 meq via INTRAVENOUS
  Filled 2015-07-06 (×3): qty 100

## 2015-07-06 MED ORDER — SODIUM CHLORIDE 0.9 % IV SOLN
1000.0000 mL | INTRAVENOUS | Status: AC
Start: 2015-07-06 — End: 2015-07-06
  Administered 2015-07-06: 1000 mL via INTRAVENOUS

## 2015-07-06 MED ORDER — OXYCODONE HCL 5 MG PO TABS
2.5000 mg | ORAL_TABLET | ORAL | Status: DC | PRN
  Administered 2015-07-07 – 2015-07-10 (×4): 2.5 mg via ORAL
  Filled 2015-07-06 (×5): qty 1

## 2015-07-06 MED ORDER — INSULIN ASPART 100 UNIT/ML ~~LOC~~ SOLN
0.0000 [IU] | Freq: Four times a day (QID) | SUBCUTANEOUS | Status: DC
  Administered 2015-07-07 (×2): 2 [IU] via SUBCUTANEOUS
  Administered 2015-07-07: 3 [IU] via SUBCUTANEOUS
  Administered 2015-07-07 – 2015-07-09 (×5): 2 [IU] via SUBCUTANEOUS

## 2015-07-06 MED ORDER — FAT EMULSION 20 % IV EMUL
240.0000 mL | INTRAVENOUS | Status: AC
  Administered 2015-07-06: 240 mL via INTRAVENOUS
  Filled 2015-07-06: qty 250

## 2015-07-06 MED ORDER — SODIUM CHLORIDE 0.9 % IJ SOLN: 10.0000 mL | INTRAMUSCULAR | Status: DC | PRN

## 2015-07-06 MED ORDER — POTASSIUM CHLORIDE 20 MEQ/15ML (10%) PO SOLN
40.0000 meq | Freq: Four times a day (QID) | ORAL | Status: AC
  Administered 2015-07-06: 40 meq via ORAL
  Filled 2015-07-06 (×2): qty 30

## 2015-07-06 MED ORDER — SODIUM CHLORIDE 0.9 % IJ SOLN
10.0000 mL | Freq: Two times a day (BID) | INTRAMUSCULAR | Status: DC
  Administered 2015-07-06 – 2015-07-11 (×7): 10 mL

## 2015-07-06 NOTE — Progress Notes (Signed)
CSW continuing to follow.   Pt admitted from [facility] and [facility] and pt not holding a bed at facility, so returning to [facility] will be dependent on bed availability and if facility can continue to meet pt needs. Pt daughter aware that CSW will need to re-initiate SNF search when pt more medically stable. Per chart, pt had PICC line with TNA to be started. Per RN, unclear at this time if pt will require TNA at discharge.   CSW contacted pt daughter, [name] to discuss. CSW explained to pt daughter that CSW is going to await to send out pt information to other facilities until determination is made if pt will need TNA as only certain SNF facilities can manage TNA. CSW to continue to follow pt progress and assist with pt disposition plan as pt becomes more stable.  CSW to continue to follow.  Alison Murray, MSW, [place] Work [phone]

## 2015-07-06 NOTE — Progress Notes (Signed)
[facility]                                                                              Patient Demographics  Destiny Velasquez, is a 81 y.o. female, DOB - 1926/02/16, PYP:[id number]  Admit date - 07/01/2015   Admitting Physician Theressa Millard, MD  Outpatient Primary MD for the patient is Velasquez, [name], DO  LOS - 4  Subjective:   Seen with daughter at bedside, patient is sleepy, easy to arouse, denies any pain. Low grade fever of 99.3 and increased WBC to 14.5 yesterday, antibiotic switched back to Zosyn.  Chief Complaint  Patient presents with  . Elevated WBC       HPI on 07/02/2015 by Dr. Jana Hakim Destiny Velasquez is a 81 y.o. female with history of Liver Adenocarcinoma S/P resection on 06/15/2015 currently in the [facility] for Rehab Rx who was sent to the ED after laboratory results revealed a persistent Elevated WBC despite 10 days of antibiotic rx for a UTI with Levaquin. She was evaluated in the ED and also found to have an elevated BUN/Cr of 41/1.82. A repeat Urine Culture was sent and she was placed on IV Azactam and referred for admission.  One of her daughters is at bedside and reports that she has observed her to have dysphagia x 2 -3 days, and difficulty swallowing her medications  Interim history Patient being treated for UTI, which was resistant to cipro (levaquin given as an outpatient).  CT abd showed 9cm fluid collection, ?abscess.  Gen surgery consulted and asked IR to place drain- pending today.  Assessment & Plan   Urinary tract infection -UA on 06/22/2015 was positive for infection however no urine culture done at that time. Patient was placed on Levaquin -UA 07/02/2015 shows WBC TNTC, large leukocytes, many bacteria -Urine culture: Ecoli mainly pan sensitive (resitant to ampicillin and cipro) -Blood cultures show no growth -WBC trending upward today, 14.   -On IV Zosyn  Right hepatic lobe bed 9 cm fluid collection -Seen on CT  scan -?Abscess vs postop fluid collections -Gen surgery consulted and appreciated, percutaneous drain placed by IR. -Continue zosyn  Acute on chronic kidney injury, stage III -Likely secondary to dehydration -Creatinine on admission 1.82, currently 0.74 -Continue to monitor BMP  Dysphagia -Speech therapy consultation appreciated, recommended regular think diet, medication: crushed with puree  Hyponatremia -Resolved, Sodium currently 135, continue monitor BMP -TSH 03/30/2015 1.6 -Continue IVF  Hypokalemia -Replace with oral supplements.  Hypothyroidism -Continue Synthroid -TSH 03/30/2015 1.6  Essential hypertension -Blood pressure currently stable -Losartan and spironolatone/HCTZ held  Hyperlipidemia -Patient no longer taking statin, should follow up with her primary care physician   Adenocarcinoma liver -Status post hepatic lobectomy 06/14/2015 -Stable  -General surgery consulted and appreciated  Physical deconditioning -PT rec SNF  Code Status: Full  Family Communication: Daughters at bedside  Disposition Plan: Admitted, Pending IR for drain placement and SNF  Time Spent in minutes 30 minutes  Procedures  None  Consults  General surgery Interventional radiology   DVT Prophylaxis Lovenox  Lab Results  Component Value Date   PLT 269 07/06/2015    Medications  Scheduled Meds: . antiseptic oral rinse  7 mL Mouth Rinse BID  . aspirin  81 mg Oral Daily  . enoxaparin (LOVENOX) injection  40 mg Subcutaneous Q24H  . levothyroxine  100 mcg Oral QAC breakfast  . piperacillin-tazobactam (ZOSYN)  IV  3.375 g Intravenous Q8H  . potassium chloride  40 mEq Oral Q6H   Continuous Infusions: . sodium chloride 1,000 mL (07/06/15 0816)   PRN Meds:.acetaminophen **OR** acetaminophen, alum & mag hydroxide-simeth, HYDROmorphone (DILAUDID) injection, LORazepam, ondansetron **OR** ondansetron (ZOFRAN) IV, oxyCODONE  Antibiotics    Anti-infectives    Start      Dose/Rate Route Frequency Ordered Stop   07/05/15 0815  piperacillin-tazobactam (ZOSYN) IVPB 3.375 g     3.375 g 12.5 mL/hr over 240 Minutes Intravenous Every 8 hours 07/05/15 0813     07/04/15 2000  cefTRIAXone (ROCEPHIN) 1 g in dextrose 5 % 50 mL IVPB  Status:  Discontinued     1 g 100 mL/hr over 30 Minutes Intravenous Every 24 hours 07/04/15 1255 07/05/15 0805   07/02/15 1200  aztreonam (AZACTAM) 500 mg in dextrose 5 % 50 mL IVPB  Status:  Discontinued     500 mg 100 mL/hr over 30 Minutes Intravenous Every 8 hours 07/02/15 0545 07/04/15 1255   07/02/15 0400  aztreonam (AZACTAM) 1 g in dextrose 5 % 50 mL IVPB     1 g 100 mL/hr over 30 Minutes Intravenous  Once 07/02/15 0346 07/02/15 0418       Objective:   Filed Vitals:   07/05/15 1614 07/05/15 1644 07/05/15 2007 07/06/15 0440  BP: 119/46 105/43 175/80 164/80  Pulse: 78 76 84 92  Temp: 98.2 F (36.8 C) 97.7 F (36.5 C) 97.5 F (36.4 C) 98 F (36.7 C)  TempSrc: Oral Oral Oral Oral  Resp: '16  16 14  '$ Height:      Weight:      SpO2: 98% 97% 100% 98%    Wt Readings from Last 3 Encounters:  07/02/15 62.869 kg (138 lb 9.6 oz)  06/14/15 65.318 kg (144 lb)  06/22/15 65.318 kg (144 lb)     Intake/Output Summary (Last 24 hours) at 07/06/15 1115 Last data filed at 07/06/15 1740  Gross per 24 hour  Intake 3652.58 ml  Output    100 ml  Net 3552.58 ml    Exam  General: Well developed, well nourished, NAD  HEENT: NCAT,  mucous membranes moist.   Cardiovascular: S1 S2 auscultated, RRR, no murmurs  Respiratory: Clear to auscultation   Abdomen: Soft, nontender, nondistended, + bowel sounds  Extremities: warm dry without cyanosis clubbing or edema  Neuro: AAOx3, nonfocal  Psych: Normal affect and demeanor, somewhat irritable today  Data Review   Micro Results Recent Results (from the past 240 hour(s))  Blood Culture (routine x 2)     Status: None (Preliminary result)   Collection Time: 07/02/15 12:20 AM   Result Value Ref Range Status   Specimen Description BLOOD LEFT ANTECUBITAL  Final   Special Requests BOTTLES DRAWN AEROBIC AND ANAEROBIC 5ML  Final   Culture   Final    NO GROWTH 3 DAYS Performed at [facility]    Report Status PENDING  Incomplete  Blood Culture (routine x 2)     Status: None (Preliminary result)   Collection Time: 07/02/15 12:27 AM  Result Value Ref Range Status   Specimen Description BLOOD LEFT HAND  Final   Special Requests IN PEDIATRIC BOTTLE 5CC  Final   Culture   Final    NO  GROWTH 3 DAYS Performed at [facility]    Report Status PENDING  Incomplete  Urine culture     Status: None   Collection Time: 07/02/15  1:41 AM  Result Value Ref Range Status   Specimen Description URINE, CLEAN CATCH  Final   Special Requests NONE  Final   Culture   Final    >=100,000 COLONIES/mL ESCHERICHIA COLI Performed at [facility]    Report Status 07/04/2015 FINAL  Final   Organism ID, Bacteria ESCHERICHIA COLI  Final      Susceptibility   Escherichia coli - MIC*    AMPICILLIN >=32 RESISTANT Resistant     CEFAZOLIN <=4 SENSITIVE Sensitive     CEFTRIAXONE <=1 SENSITIVE Sensitive     CIPROFLOXACIN >=4 RESISTANT Resistant     GENTAMICIN <=1 SENSITIVE Sensitive     IMIPENEM 0.5 SENSITIVE Sensitive     NITROFURANTOIN <=16 SENSITIVE Sensitive     TRIMETH/SULFA <=20 SENSITIVE Sensitive     AMPICILLIN/SULBACTAM 16 INTERMEDIATE Intermediate     PIP/TAZO 8 SENSITIVE Sensitive     * >=100,000 COLONIES/mL ESCHERICHIA COLI  Culture, routine-abscess     Status: None (Preliminary result)   Collection Time: 07/05/15  3:14 PM  Result Value Ref Range Status   Specimen Description ABSCESS ABDOMEN RIGHT  Final   Special Requests NONE  Final   Gram Stain   Final    RARE WBC PRESENT, PREDOMINANTLY PMN NO SQUAMOUS EPITHELIAL CELLS SEEN NO ORGANISMS SEEN Performed at [facility]    Culture NO GROWTH Performed at [facility]   Final    Report Status PENDING  Incomplete    Radiology Reports Dg Chest 2 View  07/02/2015  CLINICAL DATA:  Right upper quadrant pain. Cough with swallowing. Leukocytosis. EXAM: CHEST  2 VIEW COMPARISON:  PET-CT 05/17/2015 FINDINGS: Heart at the upper limits of normal in size, atherosclerosis of the thoracic aorta. Linear opacity in the right perihilar lung consistent with atelectasis. No consolidation to suggest pneumonia. Pulmonary vasculature is normal. No consolidation, pleural effusion, or pneumothorax. No acute osseous abnormalities are seen. IMPRESSION: Linear right perihilar atelectasis.  No pneumonia. Electronically Signed   By: Jeb Levering M.D.   On: 07/02/2015 00:47   Ct Head Wo Contrast  06/16/2015  CLINICAL DATA:  Mild facial droop, no other neurological deficits EXAM: CT HEAD WITHOUT CONTRAST TECHNIQUE: Contiguous axial images were obtained from the base of the skull through the vertex without intravenous contrast. COMPARISON:  None. FINDINGS: Diffuse age-related cortical atrophy as well as diffuse low attenuation in the deep white matter. There is no evidence of vascular territory cortical infarct. There is no evidence of mass. There is no hemorrhage or extra-axial fluid. There is no hydrocephalus. Sub cm focus of low attenuation left pons. Sub cm calcified meningioma right frontal region. Calvarium intact. No significant inflammatory change in the visualized portions of the paranasal sinuses. IMPRESSION: Chronic involutional change with no hemorrhage or vascular territory infarct. Tiny focus of low attenuation left pons measuring a few mm may reflect another focus of chronic white matter degeneration versus tiny lacunar infarct of uncertain age. This could be better evaluated with MRI if indicated. Electronically Signed   By: Skipper Cliche M.D.   On: 06/16/2015 08:38   Mr Jeri Cos JM Contrast  06/22/2015  CLINICAL DATA:  Abnormal CT scan of the head. EXAM: MRI HEAD WITHOUT AND WITH CONTRAST  TECHNIQUE: Multiplanar, multiecho pulse sequences of the brain and surrounding structures were obtained without  and with intravenous contrast. CONTRAST:  25m MULTIHANCE GADOBENATE DIMEGLUMINE 529 MG/ML IV SOLN COMPARISON:  CT head without contrast FINDINGS: Moderate generalized atrophy and white matter changes are present bilaterally. No acute infarct, hemorrhage, or mass lesion is present. The ventricles are of normal size. The ventricles are proportionate to the degree of atrophy. No significant extra-axial fluid collection is present. Is brainstem and cerebellum are within normal limits. Flow is present in the major intracranial arteries. Bilateral lens replacements are present. The globes and orbits are otherwise intact. A single posterior right ethmoid air cell is opacified. The remaining paranasal sinuses and mastoid air cells are clear. Postcontrast images demonstrate no pathologic enhancement. IMPRESSION: 1. Moderate atrophy and white matter disease. This likely reflects the sequela of chronic microvascular ischemia. 2. No acute infarct. Electronically Signed   By: CSan MorelleM.D.   On: 06/22/2015 19:50   UKoreaAbscess Drain  07/05/2015  CLINICAL DATA:  81year old female with a history of poorly differentiated carcinoma of the right liver lobe. Status post resection, has developed what appears to be abscess of the surgical site within the right liver lobe. Differential diagnosis includes biloma and abscess. EXAM: ULTRASOUND GUIDED DRAIN PLACEMENT OF RIGHT LIVER LOBE MEDICATIONS: No sedation. PROCEDURE: The procedure, risks, benefits, and alternatives were explained to the patient. Questions regarding the procedure were encouraged and answered. The patient understands and consents to the procedure. Ultrasound survey of the right upper quadrant was performed. Images were stored and sent to PACs. The right upper quadrant anterior wall was prepped with Betadine in a sterile fashion, and a sterile  drape was applied covering the operative field. A sterile gown and sterile gloves were used for the procedure. Local anesthesia was provided with 1% Lidocaine. Once the patient is prepped and draped in the usual sterile fashion, the skin and subcutaneous tissues were generously infiltrated with 1% lidocaine for local anesthesia. A small stab incision was made with 11 blade scalpel. Trocar technique was used to advance a 10 FPakistandrain in a transhepatic trajectory into the fluid collection of the rib surgical site and right upper abdomen. Bilious drainage was returned. Sample was sent to the lab. Pigtail catheter was formed and a final image was stored. Patient tolerated the procedure well and remained hemodynamically stable throughout. No complications encountered and no significant blood loss encountered. Drain was sutured into position and left to gravity drainage. COMPLICATIONS: None. FINDINGS: Ultrasound survey demonstrates complex fluid collection within the right upper quadrant with hyperechoic central aspect, which may represent fat necrosis or omental infarct. Bilious fluid was easily aspirated once the drain was in place. IMPRESSION: Status post ultrasound-guided drainage of biloma of the surgical site. Ten FPakistandrain placed. Signed, JDulcy Fanny WEarleen Newport DO [facility] Electronically Signed   By: JCorrie MckusickD.O.   On: 07/05/2015 17:59   Ct Abdomen Pelvis W Contrast  07/04/2015  CLINICAL DATA:  Three weeks status post resection of hepatic adenocarcinoma. Nausea. Persistent leukocytosis. EXAM: CT ABDOMEN AND PELVIS WITH CONTRAST TECHNIQUE: Multidetector CT imaging of the abdomen and pelvis was performed using the standard protocol following bolus administration of intravenous contrast. CONTRAST:  1076mOMNIPAQUE IOHEXOL 300 MG/ML  SOLN COMPARISON:  05/03/2015 FINDINGS: Lower chest: Small bilateral pleural effusions noted as well as tiny  pericardial effusion. Mild wall thickening of distal thoracic esophagus is suspicious for esophagitis. Hepatobiliary: There has been interval resection of previously seen right hepatic lobe mass. There is an irregular rim enhancing fluid collection  within the surgical bed in the right hepatic lobe which measures 8.1 x 8.8 cm on image 32/series 3. This could be due to postop fluid collection or abscess. No other liver lesions are identified. A small rim enhancing perihepatic fluid collection is also seen adjacent to the anterior right hepatic lobe which measures 1.8 x 7.8 cm on image 26/ series 3. This could also represent postop fluid collection or abscess. Prior cholecystectomy again noted. Dilatation of extrahepatic common bile duct remains stable. Pancreas: No mass, inflammatory changes, or other significant abnormality. Spleen: Within normal limits in size and appearance. Adrenals/Urinary Tract: No masses identified. No evidence of hydronephrosis. Mild diffuse bladder wall thickening noted as well as mild dilatation, possibly due to neurogenic bladder. Stomach/Bowel: Diverticulitis near the junction of the descending and sigmoid colon shows improvement since previous study. Vascular/Lymphatic: No pathologically enlarged lymph nodes. No evidence of abdominal aortic aneurysm. Reproductive: Previous hysterectomy a 3.8 cm cyst with a tiny posterior mural calcification is seen in the left adnexa on image 96 98/series 3 which is not significant changed since previous study. No definite malignant features identified. This could represent a diverticular abscess or cystic lesion of left ovary. Other: Small amount of free fluid in pelvic cul-de-sac. Musculoskeletal: No suspicious bone lesions identified. Spondylosis grade 2 anterolisthesis noted at L4-5. IMPRESSION: 9 cm rim enhancing fluid collection in right hepatic lobe surgical bed, and smaller perihepatic fluid collection adjacent to right lobe. Differential  diagnosis includes postop fluid collections and abscess. Improving diverticulitis. Persistent 3.8 cm left lower quadrant fluid collection, which could represent a diverticular abscess or left ovarian cystic lesion. No malignant features identified. Recommend continued attention on follow-up imaging. Small bilateral pleural effusions, tiny pericardial effusion, and small amount free fluid in pelvic cul-de-sac. Distal esophageal wall thickening, suspicious for esophagitis. Mildly distended urinary bladder with diffuse wall thickening, suspicious for neurogenic bladder. Electronically Signed   By: Earle Gell M.D.   On: 07/04/2015 15:15    CBC  Recent Labs Lab 07/02/15 0026 07/02/15 0610 07/03/15 0443 07/04/15 0444 07/05/15 0415 07/06/15 0428  WBC 14.5* 12.1* 11.7* 9.3 14.5* 13.3*  HGB 11.4* 11.6* 11.0* 10.6* 11.0* 10.7*  HCT 33.9* 35.2* 33.3* 31.8* 33.5* 32.6*  PLT 386 356 347 243 302 269  MCV 84.5 86.1 86.3 85.9 85.9 86.0  MCH 28.4 28.4 28.5 28.6 28.2 28.2  MCHC 33.6 33.0 33.0 33.3 32.8 32.8  RDW 13.4 13.7 13.8 13.7 13.8 13.8  LYMPHSABS 1.7  --   --   --   --   --   MONOABS 1.4*  --   --   --   --   --   EOSABS 0.6  --   --   --   --   --   BASOSABS 0.1  --   --   --   --   --     Chemistries   Recent Labs Lab 07/02/15 0026 07/02/15 0610 07/03/15 0443 07/04/15 0444 07/05/15 0415 07/06/15 0428  NA 132* 133* 135 135 135 139  K 4.0 3.8 3.8 3.6 3.5 3.2*  CL 97* 99* 103 106 106 110  CO2 '28 25 24 26 23 24  '$ GLUCOSE 144* 122* 106* 115* 112* 109*  BUN 41* 35* 24* '14 9 8  '$ CREATININE 1.82* 1.58* 1.25* 0.89 0.74 0.87  CALCIUM 11.0* 10.4* 9.7 9.1 9.0 8.7*  AST 24  --   --   --   --   --   ALT 17  --   --   --   --   --  ALKPHOS 84  --   --   --   --   --   BILITOT 0.6  --   --   --   --   --    ------------------------------------------------------------------------------------------------------------------ estimated creatinine clearance is 40.7 mL/min (by C-G formula based on  Cr of 0.87). ------------------------------------------------------------------------------------------------------------------ No results for input(s): HGBA1C in the last 72 hours. ------------------------------------------------------------------------------------------------------------------ No results for input(s): CHOL, HDL, LDLCALC, TRIG, CHOLHDL, LDLDIRECT in the last 72 hours. ------------------------------------------------------------------------------------------------------------------ No results for input(s): TSH, T4TOTAL, T3FREE, THYROIDAB in the last 72 hours.  Invalid input(s): FREET3 ------------------------------------------------------------------------------------------------------------------ No results for input(s): VITAMINB12, FOLATE, FERRITIN, TIBC, IRON, RETICCTPCT in the last 72 hours.  Coagulation profile  Recent Labs Lab 07/05/15 0415  INR 1.13    No results for input(s): DDIMER in the last 72 hours.  Cardiac Enzymes No results for input(s): CKMB, TROPONINI, MYOGLOBIN in the last 168 hours.  Invalid input(s): CK ------------------------------------------------------------------------------------------------------------------ Invalid input(s): POCBNP    Byrdie Miyazaki A MD. on 07/06/2015 at 11:15 AM  Between 7am to 7pm - Pager - [phone]  After 7pm go to [email or web address] - password [email or web address]  And look for the night coverage person covering for me after hours  [facility] Office  [phone]

## 2015-07-06 NOTE — Care Management Note (Signed)
Case Management Note  Patient Details  Name: Destiny Velasquez MRN: [id number] Date of Birth: [birth year]  Subjective/Objective:       81 yo admitted with pyelonephritis             Action/Plan: Pt from [facility] and [facility].   Expected Discharge Date:  07/04/15               Expected Discharge Plan:  Skilled Nursing Facility  In-House Referral:  Clinical Social Work  Discharge planning Services  CM Consult  Post Acute Care Choice:    Choice offered to:     DME Arranged:    DME Agency:     HH Arranged:    [place] Agency:     Status of Service:  Completed, signed off  Medicare Important Message Given:  Yes Date Medicare IM Given:    Medicare IM give by:    Date Additional Medicare IM Given:    Additional Medicare Important Message give by:     If discussed at [place] of Stay Meetings, dates discussed:    Additional Comments:  Lynnell Catalan, RN 07/06/2015, 2:09 PM

## 2015-07-06 NOTE — Progress Notes (Signed)
Subjective: Persistent nausea.  Vomiting solid and liquid intake (I witnessed this).  Small, loose BMs.  No abdominal pain.  Oversedated at time following Ativan per daughter. 6 % weight loss since surgery.  Objective: Vital signs in last 24 hours: Temp:  [97.5 F (36.4 C)-99.3 F (37.4 C)] 98 F (36.7 C) (11/23 0440) Pulse Rate:  [73-92] 92 (11/23 0440) Resp:  [12-20] 14 (11/23 0440) BP: (105-175)/(43-80) 164/80 mmHg (11/23 0440) SpO2:  [96 %-100 %] 98 % (11/23 0440) Last BM Date: 07/05/15  Intake/Output from previous day: 11/22 0701 - 11/23 0700 In: 2635.1 [P.O.:240; I.V.:2248.1; IV Piggyback:147] Out: -  Intake/Output this shift:    PE: General- In NAD Abdomen-soft, slightly distended, hypoactive bowel sounds, wound clean and intact, bilious drain output  Lab Results:   Recent Labs  07/05/15 0415 07/06/15 0428  WBC 14.5* 13.3*  HGB 11.0* 10.7*  HCT 33.5* 32.6*  PLT 302 269   BMET  Recent Labs  07/05/15 0415 07/06/15 0428  NA 135 139  K 3.5 3.2*  CL 106 110  CO2 23 24  GLUCOSE 112* 109*  BUN 9 8  CREATININE 0.74 0.87  CALCIUM 9.0 8.7*   PT/INR  Recent Labs  07/05/15 0415  LABPROT 14.7  INR 1.13   Comprehensive Metabolic Panel:    Component Value Date/Time   NA 139 07/06/2015 0428   NA 135 07/05/2015 0415   NA 139 03/30/2015 0813   NA 139 09/20/2014 0816   K 3.2* 07/06/2015 0428   K 3.5 07/05/2015 0415   CL 110 07/06/2015 0428   CL 106 07/05/2015 0415   CO2 24 07/06/2015 0428   CO2 23 07/05/2015 0415   BUN 8 07/06/2015 0428   BUN 9 07/05/2015 0415   BUN 18 03/30/2015 0813   BUN 18 09/20/2014 0816   CREATININE 0.87 07/06/2015 0428   CREATININE 0.74 07/05/2015 0415   GLUCOSE 109* 07/06/2015 0428   GLUCOSE 112* 07/05/2015 0415   GLUCOSE 119* 03/30/2015 0813   GLUCOSE 132* 09/20/2014 0816   CALCIUM 8.7* 07/06/2015 0428   CALCIUM 9.0 07/05/2015 0415   AST 24 07/02/2015 0026   AST 22 06/23/2015 0850   ALT 17 07/02/2015 0026   ALT  45 06/23/2015 0850   ALKPHOS 84 07/02/2015 0026   ALKPHOS 90 06/23/2015 0850   BILITOT 0.6 07/02/2015 0026   BILITOT 1.1 06/23/2015 0850   BILITOT 0.3 09/20/2014 0816   PROT 6.9 07/02/2015 0026   PROT 6.6 06/23/2015 0850   PROT 6.6 09/20/2014 0816   PROT 6.8 03/17/2014 0807   ALBUMIN 3.0* 07/02/2015 0026   ALBUMIN 3.1* 06/23/2015 0850   ALBUMIN 3.9 09/20/2014 0816   ALBUMIN 3.9 03/17/2014 0807     Studies/Results: [place] Abscess Drain  07/05/2015  CLINICAL DATA:  81 year old female with a history of poorly differentiated carcinoma of the right liver lobe. Status post resection, has developed what appears to be abscess of the surgical site within the right liver lobe. Differential diagnosis includes biloma and abscess. EXAM: ULTRASOUND GUIDED DRAIN PLACEMENT OF RIGHT LIVER LOBE MEDICATIONS: No sedation. PROCEDURE: The procedure, risks, benefits, and alternatives were explained to the patient. Questions regarding the procedure were encouraged and answered. The patient understands and consents to the procedure. Ultrasound survey of the right upper quadrant was performed. Images were stored and sent to PACs. The right upper quadrant anterior wall was prepped with Betadine in a sterile fashion, and a sterile drape was applied covering the operative field. A sterile gown and  sterile gloves were used for the procedure. Local anesthesia was provided with 1% Lidocaine. Once the patient is prepped and draped in the usual sterile fashion, the skin and subcutaneous tissues were generously infiltrated with 1% lidocaine for local anesthesia. A small stab incision was made with 11 blade scalpel. Trocar technique was used to advance a 10 [place] drain in a transhepatic trajectory into the fluid collection of the rib surgical site and right upper abdomen. Bilious drainage was returned. Sample was sent to the lab. Pigtail catheter was formed and a final image was stored. Patient tolerated the procedure well and  remained hemodynamically stable throughout. No complications encountered and no significant blood loss encountered. Drain was sutured into position and left to gravity drainage. COMPLICATIONS: None. FINDINGS: Ultrasound survey demonstrates complex fluid collection within the right upper quadrant with hyperechoic central aspect, which may represent fat necrosis or omental infarct. Bilious fluid was easily aspirated once the drain was in place. IMPRESSION: Status post ultrasound-guided drainage of biloma of the surgical site. Ten [place] drain placed. Signed, Dulcy Fanny. Earleen Newport, DO [facility] Electronically Signed   By: Corrie Mckusick D.O.   On: 07/05/2015 17:59   Ct Abdomen Pelvis W Contrast  07/04/2015  CLINICAL DATA:  Three weeks status post resection of hepatic adenocarcinoma. Nausea. Persistent leukocytosis. EXAM: CT ABDOMEN AND PELVIS WITH CONTRAST TECHNIQUE: Multidetector CT imaging of the abdomen and pelvis was performed using the standard protocol following bolus administration of intravenous contrast. CONTRAST:  120m OMNIPAQUE IOHEXOL 300 MG/ML  SOLN COMPARISON:  05/03/2015 FINDINGS: Lower chest: Small bilateral pleural effusions noted as well as tiny pericardial effusion. Mild wall thickening of distal thoracic esophagus is suspicious for esophagitis. Hepatobiliary: There has been interval resection of previously seen right hepatic lobe mass. There is an irregular rim enhancing fluid collection within the surgical bed in the right hepatic lobe which measures 8.1 x 8.8 cm on image 32/series 3. This could be due to postop fluid collection or abscess. No other liver lesions are identified. A small rim enhancing perihepatic fluid collection is also seen adjacent to the anterior right hepatic lobe which measures 1.8 x 7.8 cm on image 26/ series 3. This could also represent postop fluid collection or abscess. Prior cholecystectomy again noted. Dilatation  of extrahepatic common bile duct remains stable. Pancreas: No mass, inflammatory changes, or other significant abnormality. Spleen: Within normal limits in size and appearance. Adrenals/Urinary Tract: No masses identified. No evidence of hydronephrosis. Mild diffuse bladder wall thickening noted as well as mild dilatation, possibly due to neurogenic bladder. Stomach/Bowel: Diverticulitis near the junction of the descending and sigmoid colon shows improvement since previous study. Vascular/Lymphatic: No pathologically enlarged lymph nodes. No evidence of abdominal aortic aneurysm. Reproductive: Previous hysterectomy a 3.8 cm cyst with a tiny posterior mural calcification is seen in the left adnexa on image 96 98/series 3 which is not significant changed since previous study. No definite malignant features identified. This could represent a diverticular abscess or cystic lesion of left ovary. Other: Small amount of free fluid in pelvic cul-de-sac. Musculoskeletal: No suspicious bone lesions identified. Spondylosis grade 2 anterolisthesis noted at L4-5. IMPRESSION: 9 cm rim enhancing fluid collection in right hepatic lobe surgical bed, and smaller perihepatic fluid collection adjacent to right lobe. Differential diagnosis includes postop fluid collections and abscess. Improving diverticulitis. Persistent 3.8 cm left lower quadrant fluid collection, which could represent a diverticular abscess or left ovarian cystic lesion. No malignant features identified. Recommend continued attention on  follow-up imaging. Small bilateral pleural effusions, tiny pericardial effusion, and small amount free fluid in pelvic cul-de-sac. Distal esophageal wall thickening, suspicious for esophagitis. Mildly distended urinary bladder with diffuse wall thickening, suspicious for neurogenic bladder. Electronically Signed   By: Earle Gell M.D.   On: 07/04/2015 15:15    Anti-infectives: Anti-infectives    Start     Dose/Rate Route  Frequency Ordered Stop   07/05/15 0815  piperacillin-tazobactam (ZOSYN) IVPB 3.375 g     3.375 g 12.5 mL/hr over 240 Minutes Intravenous Every 8 hours 07/05/15 0813     07/04/15 2000  cefTRIAXone (ROCEPHIN) 1 g in dextrose 5 % 50 mL IVPB  Status:  Discontinued     1 g 100 mL/hr over 30 Minutes Intravenous Every 24 hours 07/04/15 1255 07/05/15 0805   07/02/15 1200  aztreonam (AZACTAM) 500 mg in dextrose 5 % 50 mL IVPB  Status:  Discontinued     500 mg 100 mL/hr over 30 Minutes Intravenous Every 8 hours 07/02/15 0545 07/04/15 1255   07/02/15 0400  aztreonam (AZACTAM) 1 g in dextrose 5 % 50 mL IVPB     1 g 100 mL/hr over 30 Minutes Intravenous  Once 07/02/15 0346 07/02/15 0418      Assessment Principal Problem:   Pyelonephritis   Postop ileus   Postop biloma   Failure to thrive   Moderate PC malnutrition   Adenocarcinoma of liver s/p partial right hepatic lobectomy 06/14/2015         LOS: 4 days   Plan: Decrease diet to clear liquids. PICC and TPN.   Destiny Velasquez 07/06/2015

## 2015-07-06 NOTE — Progress Notes (Signed)
Peripherally Inserted Central Catheter/Midline Placement  The IV Nurse has discussed with the patient and/or persons authorized to consent for the patient, the purpose of this procedure and the potential benefits and risks involved with this procedure.  The benefits include less needle sticks, lab draws from the catheter and patient may be discharged home with the catheter.  Risks include, but not limited to, infection, bleeding, blood clot (thrombus formation), and puncture of an artery; nerve damage and irregular heat beat.  Alternatives to this procedure were also discussed.  PICC/Midline Placement Documentation        Destiny Velasquez 07/06/2015, 2:35 PM  Consent obtained by Jule Economy, RN

## 2015-07-06 NOTE — Progress Notes (Signed)
Flushed drain with 5cc normal saline

## 2015-07-06 NOTE — Progress Notes (Signed)
PARENTERAL NUTRITION CONSULT NOTE - INITIAL  Pharmacy Consult for TPN Indication: intolerance to enteral feeding  No Known Allergies  Patient Measurements: Height: '5\' 3"'$  (160 cm) Weight: 138 lb 9.6 oz (62.869 kg) IBW/kg (Calculated) : 52.4   Vital Signs: Temp: 98 F (36.7 C) (11/23 0440) Temp Source: Oral (11/23 0440) BP: 164/80 mmHg (11/23 0440) Pulse Rate: 92 (11/23 0440) Intake/Output from previous day: 11/22 0701 - 11/23 0700 In: 3652.6 [P.O.:270; I.V.:3185.6; IV Piggyback:197] Out: -  Intake/Output from this shift: Total I/O In: -  Out: 100 [Drains:100]  Labs:  Recent Labs  07/04/15 0444 07/05/15 0415 07/06/15 0428  WBC 9.3 14.5* 13.3*  HGB 10.6* 11.0* 10.7*  HCT 31.8* 33.5* 32.6*  PLT 243 302 269  INR  --  1.13  --      Recent Labs  07/04/15 0444 07/05/15 0415 07/06/15 0428  NA 135 135 139  K 3.6 3.5 3.2*  CL 106 106 110  CO2 '26 23 24  '$ GLUCOSE 115* 112* 109*  BUN '14 9 8  '$ CREATININE 0.89 0.74 0.87  CALCIUM 9.1 9.0 8.7*   Estimated Creatinine Clearance: 40.7 mL/min (by C-G formula based on Cr of 0.87).   No results for input(s): GLUCAP in the last 72 hours.  Medical History: Past Medical History  Diagnosis Date  . Hypertension   . Hyperlipidemia   . Thyroid disease   . Subacute delirium   . Essential and other specified forms of tremor   . Unspecified hypothyroidism   . Diverticulosis of colon (without mention of hemorrhage)   . Unspecified constipation   . Anal and rectal polyp   . Disorder of bone and cartilage, unspecified   . Pneumonia X 1  . GERD (gastroesophageal reflux disease)   . Malignant neoplasm of breast (female), unspecified site   . Breast cancer, right breast ([place]) 1971    "never had chemo or radiation"  . Liver cancer ([place]) 2016  . Diverticulitis of large intestine without perforation or abscess without bleeding     Insulin Requirements: none  Current Nutrition: clear liquid  IVF: NS@ 75 mls/hr  Central  access: planned for 11/23 TPN start date:  Planned for 11/23  ASSESSMENT                                                                                                          HPI: 81 y.o. female s/p R hepatic lobectomy 06/14/15 d/t adenocarcinoma. Admitted on 11/18 for UTI, IR placed drain on 11/22. Pt with persistent nausea.  Vomiting solid and liquid intake, 6% weight loss since surgery.  Pharmacy to start TPN.   Significant events:   Today:    Glucose - CBG's < 150, no hx of diabetes  Electrolytes - K low, all others WNL  Renal - WNL  LFTs - labs tomorrow  TGs -labs tomorrow  Prealbumin -labs tomorrow  NUTRITIONAL GOALS  RD recs: pending Clinimix E 5/15 at a goal rate of 53m/hr + 20% fat emulsion at 148mhr to provide: 96g/day protein, 1843Kcal/day.  PLAN                                                                                                    KCl 4053mpo x 2 per MD                      At 1800 today:  Start Clinimix E 5/15 at 40 ml/hr.  20% fat emulsion at 60m17m.  Plan to advance as tolerated to the goal rate.  TPN to contain standard multivitamins and trace elements.  Reduce NS to 35ml83m  Add moderate SSI q6h  TPN lab panels on Mondays & Thursdays.  F/u daily.  EllenDolly Rias11/23/2016, 11:57 AM Pager [phone]

## 2015-07-06 NOTE — Progress Notes (Signed)
Nutrition Follow-up  DOCUMENTATION CODES:   Severe malnutrition in context of acute illness/injury  INTERVENTION:   Recommend Monitor magnesium, potassium, and phosphorus daily for at least 3 days, MD to replete as needed, as pt is at risk for refeeding syndrome given severe malnutrition and poor PO intake.  TPN per Pharmacy RD to continue to monitor  NUTRITION DIAGNOSIS:   Malnutrition related to acute illness as evidenced by percent weight loss, energy intake < or equal to 50% for > or equal to 5 days, moderate depletion of body fat, mild depletion of muscle mass.  Ongoing.  GOAL:   Patient will meet greater than or equal to 90% of their needs  Not meeting.  MONITOR:   Diet advancement, Labs, Weight trends, Skin, I & O's, Other (Comment) (TPN)  REASON FOR ASSESSMENT:   Consult New TPN/TNA  ASSESSMENT:   81 y.o. female with history of Liver Adenocarcinoma S/P resection on 06/15/2015 currently in the [facility] for Rehab Rx who was sent to the ED after laboratory results revealed a persistent Elevated WBC despite 10 days of antibiotic rx for a UTI with Levaquin.  Patient's diet was advanced to regular yesterday, pt developed nausea and vomited solids and liquids per surgery. Now on clear liquids. Pt is having BMs.  Plan is to place PICC and start TPN today. Monitor for refeeding risk as pt has had poor PO intake for > 1 week now and severely malnourished.  Plan per Pharmacy: KCl 75mq po x 2 per MD At 1800 today:  Start Clinimix E 5/15 at 40 ml/hr.  20% fat emulsion at 180mhr.  Plan to advance as tolerated to the goal rate.  Labs reviewed: Low K  Diet Order:  Diet clear liquid Room service appropriate?: Yes; Fluid consistency:: Thin TPN (CLINIMIX-E) Adult  Skin:  Reviewed, no issues  Last BM:  11/22  Height:   Ht Readings from Last 1 Encounters:  07/02/15 '5\' 3"'$  (1.6 m)    Weight:   Wt Readings from Last 1 Encounters:  07/02/15 138 lb 9.6 oz  (62.869 kg)    Ideal Body Weight:  52.3 kg  BMI:  Body mass index is 24.56 kg/(m^2).  Estimated Nutritional Needs:   Kcal:  1600-1800  Protein:  75-85g  Fluid:  1.8L/day  EDUCATION NEEDS:   No education needs identified at this time  LiClayton BiblesMS, RD, LDN Pager: [phone] Hours Pager: [phone]

## 2015-07-06 NOTE — Progress Notes (Signed)
Butch Penny RN reported that ativan dose had been increased then decreased back down due to oversedation today.

## 2015-07-06 NOTE — Progress Notes (Signed)
Referring Physician(s): CCS  Chief Complaint: RUQ abdominal fluid collection s/p perc drain by IR 11/22  Subjective: Patient complains of nausea and vomiting. She denies any drain site pain.   Allergies: Review of patient's allergies indicates no known allergies.  Medications: Prior to Admission medications   Medication Sig Start Date End Date Taking? Authorizing Provider  acetaminophen (TYLENOL) 325 MG tablet Take 1-2 tablets (325-650 mg total) by mouth every 6 (six) hours as needed for fever, headache, mild pain or moderate pain. 06/22/15  Yes [name], MD  alum & mag hydroxide-simeth (MAALOX/MYLANTA) 200-200-20 MG/5ML suspension Take 30 mLs by mouth every 6 (six) hours as needed for indigestion or heartburn (or bloating). 06/22/15  Yes [name], MD  aspirin 81 MG tablet Take 81 mg by mouth daily.   Yes Historical Provider, MD  ibuprofen (ADVIL,MOTRIN) 400 MG tablet Take 1-2 tablets (400-800 mg total) by mouth every 12 (twelve) hours as needed for fever or headache. 06/22/15  Yes [name], MD  levothyroxine (SYNTHROID, LEVOTHROID) 100 MCG tablet TAKE ONE TABLET BY MOUTH 30 MINUTES BEFORE BREAKFAST EVERY MORNING. SEPARATE FROM OTHER MEDICATIONS FOR THYROID. 12/20/14  Yes [name] L [name], DO  LORazepam (ATIVAN) 0.5 MG tablet Take 1 tablet (0.5 mg total) by mouth every 6 (six) hours as needed for anxiety. Patient taking differently: Take 0.5 mg by mouth every 6 (six) hours as needed for anxiety. Pt does take in prn through out the day but takes it every night at 9pm 05/09/15  Yes [name] L [name], DO  losartan (COZAAR) 50 MG tablet Take one tablet by mouth once daily for blood pressure Patient taking differently: Take 50 mg by mouth daily. Take one tablet by mouth once daily for blood pressure 02/28/15  Yes [name] L [name], DO  ondansetron (ZOFRAN) 4 MG tablet Take 1 tablet (4 mg total) by mouth every 8 (eight) hours as needed for nausea or vomiting. 05/09/15  Yes [name] L [name], DO    OVER THE COUNTER MEDICATION Take 1 Container by mouth 3 (three) times daily. Med pass   Yes Historical Provider, MD  polyethylene glycol (MIRALAX / GLYCOLAX) packet Take 17 g by mouth daily.    Yes Historical Provider, MD  Probiotic Product (PROBIOTIC DAILY PO) Take 1 tablet by mouth daily.    Yes Historical Provider, MD  simethicone (MYLICON) 160 MG chewable tablet Chew 125 mg by mouth 3 (three) times daily.   Yes Historical Provider, MD  spironolactone-hydrochlorothiazide (ALDACTAZIDE) 25-25 MG per tablet TAKE 1/2 TABLET BY MOUTH ONCE DAILY 04/11/15  Yes [name] L [name], DO  estrogens, conjugated, (PREMARIN) 0.3 MG tablet Take one tablet every other day for one month Patient not taking: Reported on 07/01/2015 03/18/14   [name] L [name], DO  feeding supplement (BOOST / RESOURCE BREEZE) LIQD Take 1 Container by mouth 3 (three) times daily between meals. Patient not taking: Reported on 06/08/2015 05/06/15   Flonnie Overman Dhungel, MD  HYDROcodone-acetaminophen (NORCO/VICODIN) 5-325 MG tablet Take 1 tablet by mouth every 6 (six) hours as needed for moderate pain. 07/04/15   Maryann Mikhail, DO  levofloxacin (LEVAQUIN) 500 MG tablet Take 1 tablet (500 mg total) by mouth daily. Patient not taking: Reported on 07/01/2015 06/23/15   [name], MD  shark liver oil-cocoa butter (PREPARATION H) 0.25-3-85.5 % suppository Place 1 suppository rectally as needed for hemorrhoids.    Historical Provider, MD  simethicone (MYLICON) 80 MG chewable tablet Chew 1 tablet (80 mg total) by mouth 4 (four) times daily  as needed for flatulence. Patient not taking: Reported on 07/01/2015 06/22/15   [name], MD  simvastatin (ZOCOR) 40 MG tablet Take 1 tablet (40 mg total) by mouth every other day. Patient not taking: Reported on 07/01/2015 04/14/15   [name] L [name], DO  traMADol (ULTRAM) 50 MG tablet Take 1 tablet (50 mg total) by mouth every 6 (six) hours as needed for moderate pain or severe pain. 07/04/15   Maryann Mikhail, DO    Vital Signs: BP 164/80 mmHg  Pulse 92  Temp(Src) 98 F (36.7 C) (Oral)  Resp 14  Ht '5\' 3"'$  (1.6 m)  Wt 138 lb 9.6 oz (62.869 kg)  BMI 24.56 kg/m2  SpO2 98%  Physical Exam General: Awake, NAD Abd: Soft, NT, RUQ drain intact-NT, light yellow bilious appearing output in bag 50 cc, 100 cc/24 hrs  Imaging: [place] Abscess Drain  07/05/2015  CLINICAL DATA:  81 year old female with a history of poorly differentiated carcinoma of the right liver lobe. Status post resection, has developed what appears to be abscess of the surgical site within the right liver lobe. Differential diagnosis includes biloma and abscess. EXAM: ULTRASOUND GUIDED DRAIN PLACEMENT OF RIGHT LIVER LOBE MEDICATIONS: No sedation. PROCEDURE: The procedure, risks, benefits, and alternatives were explained to the patient. Questions regarding the procedure were encouraged and answered. The patient understands and consents to the procedure. Ultrasound survey of the right upper quadrant was performed. Images were stored and sent to PACs. The right upper quadrant anterior wall was prepped with Betadine in a sterile fashion, and a sterile drape was applied covering the operative field. A sterile gown and sterile gloves were used for the procedure. Local anesthesia was provided with 1% Lidocaine. Once the patient is prepped and draped in the usual sterile fashion, the skin and subcutaneous tissues were generously infiltrated with 1% lidocaine for local anesthesia. A small stab incision was made with 11 blade scalpel. Trocar technique was used to advance a 10 [place] drain in a transhepatic trajectory into the fluid collection of the rib surgical site and right upper abdomen. Bilious drainage was returned. Sample was sent to the lab. Pigtail catheter was formed and a final image was stored. Patient tolerated the procedure well and remained hemodynamically stable throughout. No complications encountered and no significant blood loss encountered. Drain  was sutured into position and left to gravity drainage. COMPLICATIONS: None. FINDINGS: Ultrasound survey demonstrates complex fluid collection within the right upper quadrant with hyperechoic central aspect, which may represent fat necrosis or omental infarct. Bilious fluid was easily aspirated once the drain was in place. IMPRESSION: Status post ultrasound-guided drainage of biloma of the surgical site. Ten [place] drain placed. Signed, Dulcy Fanny. Earleen Newport, DO [facility] Electronically Signed   By: Corrie Mckusick D.O.   On: 07/05/2015 17:59   Ct Abdomen Pelvis W Contrast  07/04/2015  CLINICAL DATA:  Three weeks status post resection of hepatic adenocarcinoma. Nausea. Persistent leukocytosis. EXAM: CT ABDOMEN AND PELVIS WITH CONTRAST TECHNIQUE: Multidetector CT imaging of the abdomen and pelvis was performed using the standard protocol following bolus administration of intravenous contrast. CONTRAST:  112m OMNIPAQUE IOHEXOL 300 MG/ML  SOLN COMPARISON:  05/03/2015 FINDINGS: Lower chest: Small bilateral pleural effusions noted as well as tiny pericardial effusion. Mild wall thickening of distal thoracic esophagus is suspicious for esophagitis. Hepatobiliary: There has been interval resection of previously seen right hepatic lobe mass. There is an irregular rim enhancing fluid collection within the surgical bed in the right  hepatic lobe which measures 8.1 x 8.8 cm on image 32/series 3. This could be due to postop fluid collection or abscess. No other liver lesions are identified. A small rim enhancing perihepatic fluid collection is also seen adjacent to the anterior right hepatic lobe which measures 1.8 x 7.8 cm on image 26/ series 3. This could also represent postop fluid collection or abscess. Prior cholecystectomy again noted. Dilatation of extrahepatic common bile duct remains stable. Pancreas: No mass, inflammatory changes, or other significant  abnormality. Spleen: Within normal limits in size and appearance. Adrenals/Urinary Tract: No masses identified. No evidence of hydronephrosis. Mild diffuse bladder wall thickening noted as well as mild dilatation, possibly due to neurogenic bladder. Stomach/Bowel: Diverticulitis near the junction of the descending and sigmoid colon shows improvement since previous study. Vascular/Lymphatic: No pathologically enlarged lymph nodes. No evidence of abdominal aortic aneurysm. Reproductive: Previous hysterectomy a 3.8 cm cyst with a tiny posterior mural calcification is seen in the left adnexa on image 96 98/series 3 which is not significant changed since previous study. No definite malignant features identified. This could represent a diverticular abscess or cystic lesion of left ovary. Other: Small amount of free fluid in pelvic cul-de-sac. Musculoskeletal: No suspicious bone lesions identified. Spondylosis grade 2 anterolisthesis noted at L4-5. IMPRESSION: 9 cm rim enhancing fluid collection in right hepatic lobe surgical bed, and smaller perihepatic fluid collection adjacent to right lobe. Differential diagnosis includes postop fluid collections and abscess. Improving diverticulitis. Persistent 3.8 cm left lower quadrant fluid collection, which could represent a diverticular abscess or left ovarian cystic lesion. No malignant features identified. Recommend continued attention on follow-up imaging. Small bilateral pleural effusions, tiny pericardial effusion, and small amount free fluid in pelvic cul-de-sac. Distal esophageal wall thickening, suspicious for esophagitis. Mildly distended urinary bladder with diffuse wall thickening, suspicious for neurogenic bladder. Electronically Signed   By: Earle Gell M.D.   On: 07/04/2015 15:15    Labs:  CBC:  Recent Labs  07/03/15 0443 07/04/15 0444 07/05/15 0415 07/06/15 0428  WBC 11.7* 9.3 14.5* 13.3*  HGB 11.0* 10.6* 11.0* 10.7*  HCT 33.3* 31.8* 33.5* 32.6*    PLT 347 243 302 269    COAGS:  Recent Labs  05/04/15 1223 06/13/15 0830 06/15/15 0510 07/05/15 0415  INR 1.20 1.08  --  1.13  APTT  --   --  28  --     BMP:  Recent Labs  07/03/15 0443 07/04/15 0444 07/05/15 0415 07/06/15 0428  NA 135 135 135 139  K 3.8 3.6 3.5 3.2*  CL 103 106 106 110  CO2 '24 26 23 24  '$ GLUCOSE 106* 115* 112* 109*  BUN 24* '14 9 8  '$ CALCIUM 9.7 9.1 9.0 8.7*  CREATININE 1.25* 0.89 0.74 0.87  GFRNONAA 38* 57* >60 58*  GFRAA 44* >60 >60 >60    LIVER FUNCTION TESTS:  Recent Labs  06/21/15 0810 06/22/15 0343 06/23/15 0850 07/02/15 0026  BILITOT 1.3* 1.3* 1.1 0.6  AST 35 '21 22 24  '$ ALT 74* 55* 45 17  ALKPHOS 101 89 90 84  PROT 6.6 6.2* 6.6 6.9  ALBUMIN 3.5 3.3* 3.1* 3.0*    Assessment and Plan: Adenocarcinoma of liver s/p partial right hepatic lobectomy 06/14/15 RUQ abdominal fluid collection- biloma versus abscess S/p perc drain 11/22 by IR- bilious appearing output, Cx pending, wbc trend down 13 (14), afebrile, H/H stable Continue to flush and monitor daily output Plans per CCS/TRH   Signed: Hedy Jacob 07/06/2015, 11:44 AM  I spent a total of 15 Minutes at the the patient's bedside AND on the patient's hospital floor or unit, greater than 50% of which was counseling/coordinating care for abdominal fluid collection.

## 2015-07-07 DIAGNOSIS — K651 Peritoneal abscess: Secondary | ICD-10-CM

## 2015-07-07 LAB — CULTURE, BLOOD (ROUTINE X 2)
CULTURE: NO GROWTH
Culture: NO GROWTH

## 2015-07-07 LAB — GLUCOSE, CAPILLARY
GLUCOSE-CAPILLARY: 148 mg/dL — AB (ref 65–99)
GLUCOSE-CAPILLARY: 106 mg/dL — AB (ref 65–99)
Glucose-Capillary: 138 mg/dL — ABNORMAL HIGH (ref 65–99)
Glucose-Capillary: 131 mg/dL — ABNORMAL HIGH (ref 65–99)
Glucose-Capillary: 160 mg/dL — ABNORMAL HIGH (ref 65–99)

## 2015-07-07 LAB — COMPREHENSIVE METABOLIC PANEL
ALBUMIN: 2 g/dL — AB (ref 3.5–5.0)
ALK PHOS: 66 U/L (ref 38–126)
ALT: 13 U/L — ABNORMAL LOW (ref 14–54)
ANION GAP: 4 — AB (ref 5–15)
AST: 14 U/L — ABNORMAL LOW (ref 15–41)
BUN: 8 mg/dL (ref 6–20)
CALCIUM: 8.6 mg/dL — AB (ref 8.9–10.3)
CO2: 26 mmol/L (ref 22–32)
Chloride: 108 mmol/L (ref 101–111)
Creatinine, Ser: 0.74 mg/dL (ref 0.44–1.00)
GFR calc non Af Amer: >60 mL/min (ref >60)
Glucose, Bld: 136 mg/dL — ABNORMAL HIGH (ref 65–99)
POTASSIUM: 3.3 mmol/L — AB (ref 3.5–5.1)
SODIUM: 138 mmol/L (ref 135–145)
Total Bilirubin: 0.5 mg/dL (ref 0.3–1.2)
Total Protein: 5.1 g/dL — ABNORMAL LOW (ref 6.5–8.1)

## 2015-07-07 LAB — CBC
HCT: 28.7 % — ABNORMAL LOW (ref 36.0–46.0)
Hemoglobin: 9.5 g/dL — ABNORMAL LOW (ref 12.0–15.0)
MCH: 27.9 pg (ref 26.0–34.0)
MCHC: 33.1 g/dL (ref 30.0–36.0)
MCV: 84.2 fL (ref 78.0–100.0)
PLATELETS: 219 10*3/uL (ref 150–400)
RBC: 3.41 MIL/uL — AB (ref 3.87–5.11)
RDW: 13.6 % (ref 11.5–15.5)
WBC: 12.6 10*3/uL — ABNORMAL HIGH (ref 4.0–10.5)

## 2015-07-07 LAB — DIFFERENTIAL
BASOS ABS: 0.1 10*3/uL (ref 0.0–0.1)
Basophils Relative: 1 %
EOS ABS: 0.8 10*3/uL — AB (ref 0.0–0.7)
EOS PCT: 7 %
LYMPHS PCT: 12 %
Lymphs Abs: 1.6 10*3/uL (ref 0.7–4.0)
Monocytes Absolute: 1.1 10*3/uL — ABNORMAL HIGH (ref 0.1–1.0)
Monocytes Relative: 9 %
NEUTROS PCT: 71 %
Neutro Abs: 9 10*3/uL — ABNORMAL HIGH (ref 1.7–7.7)

## 2015-07-07 LAB — PREALBUMIN: Prealbumin: 5.5 mg/dL — ABNORMAL LOW (ref 18–38)

## 2015-07-07 LAB — TRIGLYCERIDES: TRIGLYCERIDES: 83 mg/dL (ref <150)

## 2015-07-07 LAB — PHOSPHORUS: PHOSPHORUS: 1.8 mg/dL — AB (ref 2.5–4.6)

## 2015-07-07 LAB — MAGNESIUM: MAGNESIUM: 1.5 mg/dL — AB (ref 1.7–2.4)

## 2015-07-07 MED ORDER — LEVOTHYROXINE SODIUM 100 MCG IV SOLR
50.0000 ug | Freq: Every day | INTRAVENOUS | Status: DC
Start: 2015-07-07 — End: 2015-07-09
  Administered 2015-07-07 – 2015-07-09 (×3): 50 ug via INTRAVENOUS
  Filled 2015-07-07 (×3): qty 5

## 2015-07-07 MED ORDER — MAGNESIUM SULFATE 2 GM/50ML IV SOLN
2.0000 g | Freq: Once | INTRAVENOUS | Status: AC
  Administered 2015-07-07: 2 g via INTRAVENOUS
  Filled 2015-07-07: qty 50

## 2015-07-07 MED ORDER — POTASSIUM CHLORIDE 10 MEQ/100ML IV SOLN
10.0000 meq | INTRAVENOUS | Status: AC
  Administered 2015-07-07 (×2): 10 meq via INTRAVENOUS
  Filled 2015-07-07 (×2): qty 100

## 2015-07-07 MED ORDER — TRACE MINERALS CR-CU-MN-SE-ZN 10-1000-500-60 MCG/ML IV SOLN
INTRAVENOUS | Status: AC
  Administered 2015-07-07: 18:00:00 via INTRAVENOUS
  Filled 2015-07-07: qty 960

## 2015-07-07 MED ORDER — FAT EMULSION 20 % IV EMUL
240.0000 mL | INTRAVENOUS | Status: AC
  Administered 2015-07-07: 240 mL via INTRAVENOUS
  Filled 2015-07-07: qty 250

## 2015-07-07 MED ORDER — LORAZEPAM 2 MG/ML IJ SOLN
0.5000 mg | Freq: Three times a day (TID) | INTRAMUSCULAR | Status: DC | PRN
  Administered 2015-07-07 – 2015-07-10 (×4): 0.5 mg via INTRAVENOUS
  Filled 2015-07-07 (×4): qty 1

## 2015-07-07 MED ORDER — POTASSIUM PHOSPHATES 15 MMOLE/5ML IV SOLN
20.0000 mmol | Freq: Once | INTRAVENOUS | Status: AC
  Administered 2015-07-07: 20 mmol via INTRAVENOUS
  Filled 2015-07-07: qty 6.67

## 2015-07-07 MED ORDER — LORAZEPAM BOLUS VIA INFUSION: 0.5000 mg | Freq: Three times a day (TID) | INTRAVENOUS | Status: DC | PRN

## 2015-07-07 NOTE — Progress Notes (Signed)
[facility]                                                                              Patient Demographics  Destiny Velasquez, is a 81 y.o. female, DOB - 05/16/1926, YNW:[id number]  Admit date - 07/01/2015   Admitting Physician Theressa Millard, MD  Outpatient Primary MD for the patient is [name], [name], DO  LOS - 5  Subjective:   Seen with daughter at bedside, more awake, alert and conversant today. No fever or chills, no complaints. Some reflux with the clear liquids.  Chief Complaint  Patient presents with  . Elevated WBC       HPI on 07/02/2015 by Dr. [name] is a 81 y.o. female with history of Liver Adenocarcinoma S/P resection on 06/15/2015 currently in the [facility] for Rehab Rx who was sent to the ED after laboratory results revealed a persistent Elevated WBC despite 10 days of antibiotic rx for a UTI with Levaquin. She was evaluated in the ED and also found to have an elevated BUN/Cr of 41/1.82. A repeat Urine Culture was sent and she was placed on IV Azactam and referred for admission.  One of her daughters is at bedside and reports that she has observed her to have dysphagia x 2 -3 days, and difficulty swallowing her medications  Interim history Patient being treated for UTI, which was resistant to cipro (levaquin given as an outpatient).  CT abd showed 9cm fluid collection, ?abscess.  Percutaneous drain placed on 07/05/15  Assessment & Plan   Urinary tract infection -UA on 06/22/2015 was positive for infection however no urine culture done at that time. Patient was placed on Levaquin -UA 07/02/2015 shows WBC TNTC, large leukocytes, many bacteria -Urine culture: Ecoli mainly pan sensitive (resitant to ampicillin and cipro) -Blood cultures show no growth -WBC trending upward today, 14.   -On IV Zosyn  Right hepatic lobe bed 9 cm fluid collection -Seen on CT scan -?Abscess vs postop fluid collections, however culture is NGTD. -Gen  surgery consulted and appreciated, percutaneous drain placed by IR. -Continue zosyn  Acute on chronic kidney injury, stage III -Likely secondary to dehydration -Creatinine on admission 1.82, currently 0.74 -Continue to monitor BMP  Dysphagia -Speech therapy consultation appreciated, recommended regular think diet, medication: crushed with puree  Hyponatremia -Resolved, Sodium currently 135, continue monitor BMP -TSH 03/30/2015 1.6 -Continue IVF  Hypokalemia -Was not able to take oral supplements yesterday, continued to have hypokalemia.  Hypothyroidism -Continue Synthroid -TSH 03/30/2015 1.6  Essential hypertension -Blood pressure currently stable -Losartan and spironolatone/HCTZ held  Hyperlipidemia -Patient no longer taking statin, should follow up with her primary care physician   Adenocarcinoma liver -Status post hepatic lobectomy 06/14/2015 -Stable  -General surgery consulted and appreciated  Physical deconditioning -PT rec SNF  Code Status: Full  Family Communication: Daughters at bedside  Disposition Plan: Admitted, Pending IR for drain placement and SNF  Time Spent in minutes 30 minutes  Procedures  None  Consults  General surgery Interventional radiology   DVT Prophylaxis Lovenox  Lab Results  Component Value Date   PLT 219 07/07/2015    Medications  Scheduled Meds: . antiseptic oral rinse  7 mL Mouth Rinse BID  . aspirin  81 mg Oral Daily  . enoxaparin (LOVENOX) injection  40 mg Subcutaneous Q24H  . insulin aspart  0-15 Units Subcutaneous 4 times per day  . levothyroxine  50 mcg Intravenous Daily  . piperacillin-tazobactam (ZOSYN)  IV  3.375 g Intravenous Q8H  . potassium chloride  10 mEq Intravenous Q1 Hr x 2  . potassium phosphate IVPB (mmol)  20 mmol Intravenous Once  . sodium chloride  10-40 mL Intracatheter Q12H  . traZODone  25 mg Oral QHS   Continuous Infusions: . sodium chloride 35 mL/hr at 07/07/15 0130  . Marland KitchenTPN  (CLINIMIX-E) Adult 40 mL/hr at 07/06/15 1726   And  . fat emulsion 240 mL (07/06/15 1726)  . Marland KitchenTPN (CLINIMIX-E) Adult     And  . fat emulsion     PRN Meds:.acetaminophen **OR** acetaminophen, alum & mag hydroxide-simeth, HYDROmorphone (DILAUDID) injection, LORazepam, ondansetron **OR** ondansetron (ZOFRAN) IV, oxyCODONE, sodium chloride  Antibiotics    Anti-infectives    Start     Dose/Rate Route Frequency Ordered Stop   07/05/15 0815  piperacillin-tazobactam (ZOSYN) IVPB 3.375 g     3.375 g 12.5 mL/hr over 240 Minutes Intravenous Every 8 hours 07/05/15 0813     07/04/15 2000  cefTRIAXone (ROCEPHIN) 1 g in dextrose 5 % 50 mL IVPB  Status:  Discontinued     1 g 100 mL/hr over 30 Minutes Intravenous Every 24 hours 07/04/15 1255 07/05/15 0805   07/02/15 1200  aztreonam (AZACTAM) 500 mg in dextrose 5 % 50 mL IVPB  Status:  Discontinued     500 mg 100 mL/hr over 30 Minutes Intravenous Every 8 hours 07/02/15 0545 07/04/15 1255   07/02/15 0400  aztreonam (AZACTAM) 1 g in dextrose 5 % 50 mL IVPB     1 g 100 mL/hr over 30 Minutes Intravenous  Once 07/02/15 0346 07/02/15 0418       Objective:   Filed Vitals:   07/06/15 1706 07/06/15 2026 07/07/15 0200 07/07/15 0500  BP: 143/57 163/77 152/78 150/80  Pulse: 82 92 88 82  Temp: 98.7 F (37.1 C) 98.8 F (37.1 C) 98.5 F (36.9 C) 98.6 F (37 C)  TempSrc: Oral Oral Oral Oral  Resp: '15 20 18 18  '$ Height:      Weight:      SpO2: 98% 97% 98% 98%    Wt Readings from Last 3 Encounters:  07/02/15 62.869 kg (138 lb 9.6 oz)  06/14/15 65.318 kg (144 lb)  06/22/15 65.318 kg (144 lb)     Intake/Output Summary (Last 24 hours) at 07/07/15 1132 Last data filed at 07/07/15 0549  Gross per 24 hour  Intake 1466.75 ml  Output    100 ml  Net 1366.75 ml    Exam  General: Well developed, well nourished, NAD  HEENT: NCAT,  mucous membranes moist.   Cardiovascular: S1 S2 auscultated, RRR, no murmurs  Respiratory: Clear to auscultation    Abdomen: Soft, nontender, nondistended, + bowel sounds  Extremities: warm dry without cyanosis clubbing or edema  Neuro: AAOx3, nonfocal  Psych: Normal affect and demeanor, somewhat irritable today  Data Review   Micro Results Recent Results (from the past 240 hour(s))  Blood Culture (routine x 2)     Status: None   Collection Time: 07/02/15 12:20 AM  Result Value Ref Range Status   Specimen Description BLOOD LEFT ANTECUBITAL  Final   Special Requests BOTTLES DRAWN AEROBIC AND ANAEROBIC 5ML  Final  Culture   Final    NO GROWTH 5 DAYS Performed at [facility]    Report Status 07/07/2015 FINAL  Final  Blood Culture (routine x 2)     Status: None   Collection Time: 07/02/15 12:27 AM  Result Value Ref Range Status   Specimen Description BLOOD LEFT HAND  Final   Special Requests IN PEDIATRIC BOTTLE 5CC  Final   Culture   Final    NO GROWTH 5 DAYS Performed at [facility]    Report Status 07/07/2015 FINAL  Final  Urine culture     Status: None   Collection Time: 07/02/15  1:41 AM  Result Value Ref Range Status   Specimen Description URINE, CLEAN CATCH  Final   Special Requests NONE  Final   Culture   Final    >=100,000 COLONIES/mL ESCHERICHIA COLI Performed at [facility]    Report Status 07/04/2015 FINAL  Final   Organism ID, Bacteria ESCHERICHIA COLI  Final      Susceptibility   Escherichia coli - MIC*    AMPICILLIN >=32 RESISTANT Resistant     CEFAZOLIN <=4 SENSITIVE Sensitive     CEFTRIAXONE <=1 SENSITIVE Sensitive     CIPROFLOXACIN >=4 RESISTANT Resistant     GENTAMICIN <=1 SENSITIVE Sensitive     IMIPENEM 0.5 SENSITIVE Sensitive     NITROFURANTOIN <=16 SENSITIVE Sensitive     TRIMETH/SULFA <=20 SENSITIVE Sensitive     AMPICILLIN/SULBACTAM 16 INTERMEDIATE Intermediate     PIP/TAZO 8 SENSITIVE Sensitive     * >=100,000 COLONIES/mL ESCHERICHIA COLI  Culture, routine-abscess     Status: None (Preliminary result)   Collection Time:  07/05/15  3:14 PM  Result Value Ref Range Status   Specimen Description ABSCESS ABDOMEN RIGHT  Final   Special Requests NONE  Final   Gram Stain   Final    RARE WBC PRESENT, PREDOMINANTLY PMN NO SQUAMOUS EPITHELIAL CELLS SEEN NO ORGANISMS SEEN Performed at [facility]    Culture   Final    NO GROWTH 1 DAY Performed at [facility]    Report Status PENDING  Incomplete    Radiology Reports Dg Chest 2 View  07/02/2015  CLINICAL DATA:  Right upper quadrant pain. Cough with swallowing. Leukocytosis. EXAM: CHEST  2 VIEW COMPARISON:  PET-CT 05/17/2015 FINDINGS: Heart at the upper limits of normal in size, atherosclerosis of the thoracic aorta. Linear opacity in the right perihilar lung consistent with atelectasis. No consolidation to suggest pneumonia. Pulmonary vasculature is normal. No consolidation, pleural effusion, or pneumothorax. No acute osseous abnormalities are seen. IMPRESSION: Linear right perihilar atelectasis.  No pneumonia. Electronically Signed   By: Jeb Levering M.D.   On: 07/02/2015 00:47   Ct Head Wo Contrast  06/16/2015  CLINICAL DATA:  Mild facial droop, no other neurological deficits EXAM: CT HEAD WITHOUT CONTRAST TECHNIQUE: Contiguous axial images were obtained from the base of the skull through the vertex without intravenous contrast. COMPARISON:  None. FINDINGS: Diffuse age-related cortical atrophy as well as diffuse low attenuation in the deep white matter. There is no evidence of vascular territory cortical infarct. There is no evidence of mass. There is no hemorrhage or extra-axial fluid. There is no hydrocephalus. Sub cm focus of low attenuation left pons. Sub cm calcified meningioma right frontal region. Calvarium intact. No significant inflammatory change in the visualized portions of the paranasal sinuses. IMPRESSION: Chronic involutional change with no hemorrhage or vascular territory infarct. Tiny focus of low attenuation  left pons measuring a  few mm may reflect another focus of chronic white matter degeneration versus tiny lacunar infarct of uncertain age. This could be better evaluated with MRI if indicated. Electronically Signed   By: Skipper Cliche M.D.   On: 06/16/2015 08:38   Mr Jeri Cos IF Contrast  06/22/2015  CLINICAL DATA:  Abnormal CT scan of the head. EXAM: MRI HEAD WITHOUT AND WITH CONTRAST TECHNIQUE: Multiplanar, multiecho pulse sequences of the brain and surrounding structures were obtained without and with intravenous contrast. CONTRAST:  39m MULTIHANCE GADOBENATE DIMEGLUMINE 529 MG/ML IV SOLN COMPARISON:  CT head without contrast FINDINGS: Moderate generalized atrophy and white matter changes are present bilaterally. No acute infarct, hemorrhage, or mass lesion is present. The ventricles are of normal size. The ventricles are proportionate to the degree of atrophy. No significant extra-axial fluid collection is present. Is brainstem and cerebellum are within normal limits. Flow is present in the major intracranial arteries. Bilateral lens replacements are present. The globes and orbits are otherwise intact. A single posterior right ethmoid air cell is opacified. The remaining paranasal sinuses and mastoid air cells are clear. Postcontrast images demonstrate no pathologic enhancement. IMPRESSION: 1. Moderate atrophy and white matter disease. This likely reflects the sequela of chronic microvascular ischemia. 2. No acute infarct. Electronically Signed   By: CSan MorelleM.D.   On: 06/22/2015 19:50   UKoreaAbscess Drain  07/05/2015  CLINICAL DATA:  [age]year old female with a history of poorly differentiated carcinoma of the right liver lobe. Status post resection, has developed what appears to be abscess of the surgical site within the right liver lobe. Differential diagnosis includes biloma and abscess. EXAM: ULTRASOUND GUIDED DRAIN PLACEMENT OF RIGHT LIVER LOBE MEDICATIONS: No sedation. PROCEDURE: The procedure, risks, benefits,  and alternatives were explained to the patient. Questions regarding the procedure were encouraged and answered. The patient understands and consents to the procedure. Ultrasound survey of the right upper quadrant was performed. Images were stored and sent to PACs. The right upper quadrant anterior wall was prepped with Betadine in a sterile fashion, and a sterile drape was applied covering the operative field. A sterile gown and sterile gloves were used for the procedure. Local anesthesia was provided with 1% Lidocaine. Once the patient is prepped and draped in the usual sterile fashion, the skin and subcutaneous tissues were generously infiltrated with 1% lidocaine for local anesthesia. A small stab incision was made with 11 blade scalpel. Trocar technique was used to advance a 10 FPakistandrain in a transhepatic trajectory into the fluid collection of the rib surgical site and right upper abdomen. Bilious drainage was returned. Sample was sent to the lab. Pigtail catheter was formed and a final image was stored. Patient tolerated the procedure well and remained hemodynamically stable throughout. No complications encountered and no significant blood loss encountered. Drain was sutured into position and left to gravity drainage. COMPLICATIONS: None. FINDINGS: Ultrasound survey demonstrates complex fluid collection within the right upper quadrant with hyperechoic central aspect, which may represent fat necrosis or omental infarct. Bilious fluid was easily aspirated once the drain was in place. IMPRESSION: Status post ultrasound-guided drainage of biloma of the surgical site. Ten FPakistandrain placed. Signed, JDulcy Fanny WEarleen Newport DO [facility] Electronically Signed   By: JCorrie MckusickD.O.   On: 07/05/2015 17:59   Ct Abdomen Pelvis W Contrast  07/04/2015  CLINICAL DATA:  Three weeks status post resection of hepatic adenocarcinoma. Nausea. Persistent  leukocytosis.  EXAM: CT ABDOMEN AND PELVIS WITH CONTRAST TECHNIQUE: Multidetector CT imaging of the abdomen and pelvis was performed using the standard protocol following bolus administration of intravenous contrast. CONTRAST:  15m OMNIPAQUE IOHEXOL 300 MG/ML  SOLN COMPARISON:  05/03/2015 FINDINGS: Lower chest: Small bilateral pleural effusions noted as well as tiny pericardial effusion. Mild wall thickening of distal thoracic esophagus is suspicious for esophagitis. Hepatobiliary: There has been interval resection of previously seen right hepatic lobe mass. There is an irregular rim enhancing fluid collection within the surgical bed in the right hepatic lobe which measures 8.1 x 8.8 cm on image 32/series 3. This could be due to postop fluid collection or abscess. No other liver lesions are identified. A small rim enhancing perihepatic fluid collection is also seen adjacent to the anterior right hepatic lobe which measures 1.8 x 7.8 cm on image 26/ series 3. This could also represent postop fluid collection or abscess. Prior cholecystectomy again noted. Dilatation of extrahepatic common bile duct remains stable. Pancreas: No mass, inflammatory changes, or other significant abnormality. Spleen: Within normal limits in size and appearance. Adrenals/Urinary Tract: No masses identified. No evidence of hydronephrosis. Mild diffuse bladder wall thickening noted as well as mild dilatation, possibly due to neurogenic bladder. Stomach/Bowel: Diverticulitis near the junction of the descending and sigmoid colon shows improvement since previous study. Vascular/Lymphatic: No pathologically enlarged lymph nodes. No evidence of abdominal aortic aneurysm. Reproductive: Previous hysterectomy a 3.8 cm cyst with a tiny posterior mural calcification is seen in the left adnexa on image 96 98/series 3 which is not significant changed since previous study. No definite malignant features identified. This could represent a diverticular  abscess or cystic lesion of left ovary. Other: Small amount of free fluid in pelvic cul-de-sac. Musculoskeletal: No suspicious bone lesions identified. Spondylosis grade 2 anterolisthesis noted at L4-5. IMPRESSION: 9 cm rim enhancing fluid collection in right hepatic lobe surgical bed, and smaller perihepatic fluid collection adjacent to right lobe. Differential diagnosis includes postop fluid collections and abscess. Improving diverticulitis. Persistent 3.8 cm left lower quadrant fluid collection, which could represent a diverticular abscess or left ovarian cystic lesion. No malignant features identified. Recommend continued attention on follow-up imaging. Small bilateral pleural effusions, tiny pericardial effusion, and small amount free fluid in pelvic cul-de-sac. Distal esophageal wall thickening, suspicious for esophagitis. Mildly distended urinary bladder with diffuse wall thickening, suspicious for neurogenic bladder. Electronically Signed   By: JEarle GellM.D.   On: 07/04/2015 15:15    CBC  Recent Labs Lab 07/02/15 0026  07/03/15 0443 07/04/15 0444 07/05/15 0415 07/06/15 0428 07/07/15 0530  WBC 14.5*  < > 11.7* 9.3 14.5* 13.3* 12.6*  HGB 11.4*  < > 11.0* 10.6* 11.0* 10.7* 9.5*  HCT 33.9*  < > 33.3* 31.8* 33.5* 32.6* 28.7*  PLT 386  < > 347 243 302 269 219  MCV 84.5  < > 86.3 85.9 85.9 86.0 84.2  MCH 28.4  < > 28.5 28.6 28.2 28.2 27.9  MCHC 33.6  < > 33.0 33.3 32.8 32.8 33.1  RDW 13.4  < > 13.8 13.7 13.8 13.8 13.6  LYMPHSABS 1.7  --   --   --   --   --  1.6  MONOABS 1.4*  --   --   --   --   --  1.1*  EOSABS 0.6  --   --   --   --   --  0.8*  BASOSABS 0.1  --   --   --   --   --  0.1  < > = values in this interval not displayed.  Chemistries   Recent Labs Lab 07/02/15 0026  07/03/15 0443 07/04/15 0444 07/05/15 0415 07/06/15 0428 07/07/15 0530  NA 132*  < > 135 135 135 139 138  K 4.0  < > 3.8 3.6 3.5 3.2* 3.3*  CL 97*  < > 103 106 106 110 108  CO2 28  < > '24 26 23 24 26   '$ GLUCOSE 144*  < > 106* 115* 112* 109* 136*  BUN 41*  < > 24* '14 9 8 8  '$ CREATININE 1.82*  < > 1.25* 0.89 0.74 0.87 0.74  CALCIUM 11.0*  < > 9.7 9.1 9.0 8.7* 8.6*  MG  --   --   --   --   --   --  1.5*  AST 24  --   --   --   --   --  14*  ALT 17  --   --   --   --   --  13*  ALKPHOS 84  --   --   --   --   --  66  BILITOT 0.6  --   --   --   --   --  0.5  < > = values in this interval not displayed. ------------------------------------------------------------------------------------------------------------------ estimated creatinine clearance is 44.3 mL/min (by C-G formula based on Cr of 0.74). ------------------------------------------------------------------------------------------------------------------ No results for input(s): HGBA1C in the last 72 hours. ------------------------------------------------------------------------------------------------------------------  Recent Labs  07/07/15 0545  TRIG 83   ------------------------------------------------------------------------------------------------------------------ No results for input(s): TSH, T4TOTAL, T3FREE, THYROIDAB in the last 72 hours.  Invalid input(s): FREET3 ------------------------------------------------------------------------------------------------------------------ No results for input(s): VITAMINB12, FOLATE, FERRITIN, TIBC, IRON, RETICCTPCT in the last 72 hours.  Coagulation profile  Recent Labs Lab 07/05/15 0415  INR 1.13    No results for input(s): DDIMER in the last 72 hours.  Cardiac Enzymes No results for input(s): CKMB, TROPONINI, MYOGLOBIN in the last 168 hours.  Invalid input(s): CK ------------------------------------------------------------------------------------------------------------------ Invalid input(s): POCBNP    Laticia Vannostrand A MD. on 07/07/2015 at 11:32 AM  Between 7am to 7pm - Pager - [phone]  After 7pm go to [email or web address] - password [email or web address]  And look for the night  coverage person covering for me after hours  [facility] Office  [phone]

## 2015-07-07 NOTE — Progress Notes (Signed)
Patient ID: Destiny Velasquez, female   DOB: 08-30-1925, 81 y.o.   MRN: [id number]    Subjective: Continues to have regurgitation.  It is not clear if it is truly nausea or mechanical.  She is having significant difficulty with pills.    Objective: Vital signs in last 24 hours: Temp:  [98.5 F (36.9 C)-98.8 F (37.1 C)] 98.6 F (37 C) (11/24 0500) Pulse Rate:  [82-92] 82 (11/24 0500) Resp:  [15-20] 18 (11/24 0500) BP: (143-163)/(57-80) 150/80 mmHg (11/24 0500) SpO2:  [97 %-98 %] 98 % (11/24 0500) Last BM Date: 07/05/15  Intake/Output from previous day: 11/23 0701 - 11/24 0700 In: 1466.8 [I.V.:392.6; IV Piggyback:450; TPN:619.2] Out: 200 [Drains:200] Intake/Output this shift:    PE: General- In NAD Abdomen-soft, non distended.  Non tender.  Drain bilious.    Lab Results:   Recent Labs  07/06/15 0428 07/07/15 0530  WBC 13.3* 12.6*  HGB 10.7* 9.5*  HCT 32.6* 28.7*  PLT 269 219   BMET  Recent Labs  07/06/15 0428 07/07/15 0530  NA 139 138  K 3.2* 3.3*  CL 110 108  CO2 24 26  GLUCOSE 109* 136*  BUN 8 8  CREATININE 0.87 0.74  CALCIUM 8.7* 8.6*   PT/INR  Recent Labs  07/05/15 0415  LABPROT 14.7  INR 1.13   Comprehensive Metabolic Panel:    Component Value Date/Time   NA 138 07/07/2015 0530   NA 139 07/06/2015 0428   NA 139 03/30/2015 0813   NA 139 09/20/2014 0816   K 3.3* 07/07/2015 0530   K 3.2* 07/06/2015 0428   CL 108 07/07/2015 0530   CL 110 07/06/2015 0428   CO2 26 07/07/2015 0530   CO2 24 07/06/2015 0428   BUN 8 07/07/2015 0530   BUN 8 07/06/2015 0428   BUN 18 03/30/2015 0813   BUN 18 09/20/2014 0816   CREATININE 0.74 07/07/2015 0530   CREATININE 0.87 07/06/2015 0428   GLUCOSE 136* 07/07/2015 0530   GLUCOSE 109* 07/06/2015 0428   GLUCOSE 119* 03/30/2015 0813   GLUCOSE 132* 09/20/2014 0816   CALCIUM 8.6* 07/07/2015 0530   CALCIUM 8.7* 07/06/2015 0428   AST 14* 07/07/2015 0530   AST 24 07/02/2015 0026   ALT 13* 07/07/2015 0530   ALT 17  07/02/2015 0026   ALKPHOS 66 07/07/2015 0530   ALKPHOS 84 07/02/2015 0026   BILITOT 0.5 07/07/2015 0530   BILITOT 0.6 07/02/2015 0026   BILITOT 0.3 09/20/2014 0816   PROT 5.1* 07/07/2015 0530   PROT 6.9 07/02/2015 0026   PROT 6.6 09/20/2014 0816   PROT 6.8 03/17/2014 0807   ALBUMIN 2.0* 07/07/2015 0530   ALBUMIN 3.0* 07/02/2015 0026   ALBUMIN 3.9 09/20/2014 0816   ALBUMIN 3.9 03/17/2014 0807     Studies/Results: [place] Abscess Drain  07/05/2015  CLINICAL DATA:  81 year old female with a history of poorly differentiated carcinoma of the right liver lobe. Status post resection, has developed what appears to be abscess of the surgical site within the right liver lobe. Differential diagnosis includes biloma and abscess. EXAM: ULTRASOUND GUIDED DRAIN PLACEMENT OF RIGHT LIVER LOBE MEDICATIONS: No sedation. PROCEDURE: The procedure, risks, benefits, and alternatives were explained to the patient. Questions regarding the procedure were encouraged and answered. The patient understands and consents to the procedure. Ultrasound survey of the right upper quadrant was performed. Images were stored and sent to PACs. The right upper quadrant anterior wall was prepped with Betadine in a sterile fashion, and a sterile drape was  applied covering the operative field. A sterile gown and sterile gloves were used for the procedure. Local anesthesia was provided with 1% Lidocaine. Once the patient is prepped and draped in the usual sterile fashion, the skin and subcutaneous tissues were generously infiltrated with 1% lidocaine for local anesthesia. A small stab incision was made with 11 blade scalpel. Trocar technique was used to advance a 10 [place] drain in a transhepatic trajectory into the fluid collection of the rib surgical site and right upper abdomen. Bilious drainage was returned. Sample was sent to the lab. Pigtail catheter was formed and a final image was stored. Patient tolerated the procedure well and remained  hemodynamically stable throughout. No complications encountered and no significant blood loss encountered. Drain was sutured into position and left to gravity drainage. COMPLICATIONS: None. FINDINGS: Ultrasound survey demonstrates complex fluid collection within the right upper quadrant with hyperechoic central aspect, which may represent fat necrosis or omental infarct. Bilious fluid was easily aspirated once the drain was in place. IMPRESSION: Status post ultrasound-guided drainage of biloma of the surgical site. Ten [place] drain placed. Signed, Dulcy Fanny. Earleen Newport, DO [facility] Electronically Signed   By: Corrie Mckusick D.O.   On: 07/05/2015 17:59    Anti-infectives: Anti-infectives    Start     Dose/Rate Route Frequency Ordered Stop   07/05/15 0815  piperacillin-tazobactam (ZOSYN) IVPB 3.375 g     3.375 g 12.5 mL/hr over 240 Minutes Intravenous Every 8 hours 07/05/15 0813     07/04/15 2000  cefTRIAXone (ROCEPHIN) 1 g in dextrose 5 % 50 mL IVPB  Status:  Discontinued     1 g 100 mL/hr over 30 Minutes Intravenous Every 24 hours 07/04/15 1255 07/05/15 0805   07/02/15 1200  aztreonam (AZACTAM) 500 mg in dextrose 5 % 50 mL IVPB  Status:  Discontinued     500 mg 100 mL/hr over 30 Minutes Intravenous Every 8 hours 07/02/15 0545 07/04/15 1255   07/02/15 0400  aztreonam (AZACTAM) 1 g in dextrose 5 % 50 mL IVPB     1 g 100 mL/hr over 30 Minutes Intravenous  Once 07/02/15 0346 07/02/15 0418      Assessment Principal Problem:   Pyelonephritis   Postop ileus   Postop biloma   Failure to thrive   Moderate PC malnutrition   Adenocarcinoma of liver s/p partial right hepatic lobectomy 06/14/2015         LOS: 5 days   Plan:  Clears, TNA Adjust bed so she can sit up better. Change as many medications as possible to PO.   Unclear why she has this regurgitation.  Speech has evaluated and she is not aspirating.  Likely related to biloma, but  should resolve with drainage.    [facility] 07/07/2015

## 2015-07-07 NOTE — Progress Notes (Signed)
Flushed drain with 5 ccof NS at 0830.

## 2015-07-07 NOTE — Progress Notes (Signed)
PARENTERAL NUTRITION CONSULT NOTE   Pharmacy Consult for TPN Indication: intolerance to enteral feeding  No Known Allergies  Patient Measurements: Height: '5\' 3"'$  (160 cm) Weight: 138 lb 9.6 oz (62.869 kg) IBW/kg (Calculated) : 52.4   Vital Signs: Temp: 98.6 F (37 C) (11/24 0500) Temp Source: Oral (11/24 0500) BP: 150/80 mmHg (11/24 0500) Pulse Rate: 82 (11/24 0500) Intake/Output from previous day: 11/23 0701 - 11/24 0700 In: 382.9 [I.V.:90.4; IV Piggyback:100; TPN:187.5] Out: 100 [Drains:100] Intake/Output from this shift:    Labs:  Recent Labs  07/05/15 0415 07/06/15 0428 07/07/15 0530  WBC 14.5* 13.3* 12.6*  HGB 11.0* 10.7* 9.5*  HCT 33.5* 32.6* 28.7*  PLT 302 269 219  INR 1.13  --   --      Recent Labs  07/05/15 0415 07/06/15 0428 07/07/15 0530  NA 135 139 138  K 3.5 3.2* 3.3*  CL 106 110 108  CO2 '23 24 26  '$ GLUCOSE 112* 109* 136*  BUN '9 8 8  '$ CREATININE 0.74 0.87 0.74  CALCIUM 9.0 8.7* 8.6*  MG  --   --  1.5*  PHOS  --   --  1.8*  PROT  --   --  5.1*  ALBUMIN  --   --  2.0*  AST  --   --  14*  ALT  --   --  13*  ALKPHOS  --   --  66  BILITOT  --   --  0.5   Estimated Creatinine Clearance: 44.3 mL/min (by C-G formula based on Cr of 0.74).    Recent Labs  07/07/15 0105 07/07/15 0634  GLUCAP 148* 138*    Insulin Requirements: 4 units insulin since midnight  Current Nutrition: clear liquid  IVF: NS@ 35 mls/hr  Central access: PICC 11/23 TPN start date:  11/23  ASSESSMENT                                                                                                          HPI: 81 y.o. female s/p R hepatic lobectomy 06/14/15 d/t adenocarcinoma. Admitted on 11/18 for UTI, IR placed drain on 11/22. Pt with persistent nausea.  Vomiting solid and liquid intake, 6% weight loss since surgery.  Pharmacy to start TPN.   Significant events:   Today:   Glucose - CBG's < 150, no hx of diabetes  Electrolytes - K, Mg and Phos low, all others  WNL (Corr Ca at ULN)  Renal - SCr WNL  LFTs - AST/ALT low  TGs - 83  Prealbumin - pending  NUTRITIONAL GOALS  RD recs: pending Clinimix E 5/15 at a goal rate of 47m/hr + 20% fat emulsion at 184mhr to provide: 96g/day protein, 1843Kcal/day.  PLAN                                                                                           - Magnesium 2gm IVPB x 1 - Kphos 207m x 1 (will also provide 40m49m+) - KCl x 2 runs (for a total of 50me2m)  At 1800 today:  Possible refeeding syndrome based on electrolytes, continue Clinimix E 5/15 at 40 ml/hr tonight (No advancement)  20% fat emulsion at 10ml/62m Plan to advance as tolerated to the goal rate.  TPN to contain standard multivitamins and trace elements.  Continue NS to 35ml/h54mContinue moderate SSI/CBGs q6h  TPN lab panels on Mondays & Thursdays.  F/u daily.  Labs in am  Dustin Doreene ElandD, BCPS.   Pager: [phone] 7:21 AM

## 2015-07-07 NOTE — Progress Notes (Signed)
Drain flushed with 5cc of NS.

## 2015-07-08 LAB — GLUCOSE, CAPILLARY
GLUCOSE-CAPILLARY: 123 mg/dL — AB (ref 65–99)
GLUCOSE-CAPILLARY: 123 mg/dL — AB (ref 65–99)
Glucose-Capillary: 118 mg/dL — ABNORMAL HIGH (ref 65–99)
Glucose-Capillary: 133 mg/dL — ABNORMAL HIGH (ref 65–99)

## 2015-07-08 LAB — BASIC METABOLIC PANEL
Anion gap: 6 (ref 5–15)
BUN: 9 mg/dL (ref 6–20)
CALCIUM: 8.7 mg/dL — AB (ref 8.9–10.3)
CO2: 25 mmol/L (ref 22–32)
CREATININE: 0.88 mg/dL (ref 0.44–1.00)
Chloride: 105 mmol/L (ref 101–111)
GFR calc Af Amer: >60 mL/min (ref >60)
GFR, EST NON AFRICAN AMERICAN: 57 mL/min — AB (ref >60)
GLUCOSE: 128 mg/dL — AB (ref 65–99)
Potassium: 3.7 mmol/L (ref 3.5–5.1)
SODIUM: 136 mmol/L (ref 135–145)

## 2015-07-08 LAB — MAGNESIUM: Magnesium: 1.9 mg/dL (ref 1.7–2.4)

## 2015-07-08 LAB — PHOSPHORUS: Phosphorus: 2.4 mg/dL — ABNORMAL LOW (ref 2.5–4.6)

## 2015-07-08 MED ORDER — POTASSIUM PHOSPHATES 15 MMOLE/5ML IV SOLN
20.0000 mmol | Freq: Once | INTRAVENOUS | Status: AC
  Administered 2015-07-08: 20 mmol via INTRAVENOUS
  Filled 2015-07-08 (×2): qty 6.67

## 2015-07-08 MED ORDER — FAT EMULSION 20 % IV EMUL
240.0000 mL | INTRAVENOUS | Status: AC
  Administered 2015-07-08: 240 mL via INTRAVENOUS
  Filled 2015-07-08: qty 250

## 2015-07-08 MED ORDER — TRACE MINERALS CR-CU-MN-SE-ZN 10-1000-500-60 MCG/ML IV SOLN
INTRAVENOUS | Status: AC
  Administered 2015-07-08: 17:00:00 via INTRAVENOUS
  Filled 2015-07-08: qty 960

## 2015-07-08 NOTE — Progress Notes (Signed)
PARENTERAL NUTRITION CONSULT NOTE   Pharmacy Consult for TPN Indication: intolerance to enteral feeding  No Known Allergies  Patient Measurements: Height: '5\' 3"'$  (160 cm) Weight: 138 lb 9.6 oz (62.869 kg) IBW/kg (Calculated) : 52.4   Vital Signs: Temp: 97.9 F (36.6 C) (11/25 0519) Temp Source: Oral (11/25 0519) BP: 155/55 mmHg (11/25 0519) Pulse Rate: 76 (11/25 0519) Intake/Output from previous day: 11/24 0701 - 11/25 0700 In: 2240.1 [P.O.:360; I.V.:796.9; IV Piggyback:150; TPN:928.2] Out: 86 [Drains:85; Stool:1] Intake/Output from this shift:    Labs:  Recent Labs  07/06/15 0428 07/07/15 0530  WBC 13.3* 12.6*  HGB 10.7* 9.5*  HCT 32.6* 28.7*  PLT 269 219     Recent Labs  07/06/15 0428 07/07/15 0530 07/07/15 0545 07/08/15 0425  NA 139 138  --  136  K 3.2* 3.3*  --  3.7  CL 110 108  --  105  CO2 24 26  --  25  GLUCOSE 109* 136*  --  128*  BUN 8 8  --  9  CREATININE 0.87 0.74  --  0.88  CALCIUM 8.7* 8.6*  --  8.7*  MG  --  1.5*  --  1.9  PHOS  --  1.8*  --  2.4*  PROT  --  5.1*  --   --   ALBUMIN  --  2.0*  --   --   AST  --  14*  --   --   ALT  --  13*  --   --   ALKPHOS  --  66  --   --   BILITOT  --  0.5  --   --   PREALBUMIN  --  5.5*  --   --   TRIG  --   --  83  --    Estimated Creatinine Clearance: 40.2 mL/min (by C-G formula based on Cr of 0.88).    Recent Labs  07/07/15 2128 07/08/15 0035 07/08/15 0601  GLUCAP 106* 118* 123*    Insulin Requirements: 7 units/ 24 hr. CBG range: 160-106  Current Nutrition: clear liquid  IVF: NS@ 35 mls/hr  Central access: PICC 11/23 TPN start date:  11/23  ASSESSMENT                                                                                                          HPI: 81 y.o. female s/p R hepatic lobectomy 06/14/15 d/t adenocarcinoma. Admitted on 11/18 for UTI, IR placed drain on 11/22. Pt with persistent nausea.  Vomiting solid and liquid intake on admit, 6% weight loss since surgery.   Pharmacy to start TPN.   Significant events:   Diet advanced to Full liquid 11/25  Today:   Glucose - CBG's < 150, no hx of diabetes  Electrolytes - K, Mg repleted, Phos still slightly low, all others WNL (Corr Ca at ULN). Kphos IVPB 11/24 & 11/25  Renal - SCr WNL  LFTs - AST/ALT low  TGs - 83 (11/24)  Prealbumin - 5.5 (11/24)  NUTRITIONAL GOALS  RD recs: Kcal 1600-1800, Protein 75-85 gm/day Clinimix E 5/15 at a goal rate of 16m/hr + 20% fat emulsion at 114mhr to provide: 96g/day protein, 1843Kcal/day.  PLAN                                                                                           - Magnesium 2gm IVPB x 1 (11/24) - Kphos 207m x 1 (will also provide 7m31m+) (11/24 & 25) - KCl x 2 runs (for a total of 50me93m) (11/24)  At 1800 today:  Continue Clinimix E 5/15 at 40 ml/hr tonight   20% fat emulsion at 10ml/7m Plan to advance as tolerated to the goal rate if po diet not tolerated (diet advanced today)  TPN to contain standard multivitamins and trace elements.  Continue NS at 35ml/h4mContinue moderate SSI/CBGs q6h  TPN lab panels on Mondays & Thursdays.  F/u daily.  Labs in am, BMET and Phos level  Kharma Sampsel, Minda Ditto Pager [phone], 11:07 AM

## 2015-07-08 NOTE — Progress Notes (Signed)
[facility]                                                                              Patient Demographics  Destiny Velasquez, is a 81 y.o. female, DOB - 05/06/1926, SWH:[id number]  Admit date - 07/01/2015   Admitting Physician Theressa Millard, MD  Outpatient Primary MD for the patient is [name], [name], DO  LOS - 6  Subjective:   Seen with daughter at bedside, wanted diet to be advanced. Advised to see at least for one hour after small meals, avoid carbonated drinks.  Chief Complaint  Patient presents with  . Elevated WBC       HPI on 07/02/2015 by Dr. Jana Hakim Destiny Velasquez is a 81 y.o. female with history of Liver Adenocarcinoma S/P resection on 06/15/2015 currently in the [facility] for Rehab Rx who was sent to the ED after laboratory results revealed a persistent Elevated WBC despite 10 days of antibiotic rx for a UTI with Levaquin. She was evaluated in the ED and also found to have an elevated BUN/Cr of 41/1.82. A repeat Urine Culture was sent and she was placed on IV Azactam and referred for admission.  One of her daughters is at bedside and reports that she has observed her to have dysphagia x 2 -3 days, and difficulty swallowing her medications  Interim history Patient being treated for UTI, which was resistant to cipro (levaquin given as an outpatient).  CT abd showed 9cm fluid collection, ?abscess.  Percutaneous drain placed on 07/05/15  Assessment & Plan   Urinary tract infection -UA on 06/22/2015 was positive for infection however no urine culture done at that time.  -UA 07/02/2015 shows WBC TNTC, large leukocytes, many bacteria -Urine culture: Ecoli mainly pan sensitive (resitant to ampicillin and cipro) -Blood cultures show no growth -WBC trending upward today, 14.   -On IV Zosyn  Right hepatic lobe bed 9 cm fluid collection -Seen on CT scan -?Abscess vs postop fluid collections, however culture is NGTD. -Gen surgery consulted and  appreciated, percutaneous drain placed by IR. -Continue zosyn  Acute on chronic kidney injury, stage III -Likely secondary to dehydration -Creatinine on admission 1.82, currently 0.74 -Continue to monitor BMP  Dysphagia -Speech therapy consultation appreciated, recommended regular diet, medication: crushed with puree  Hyponatremia -Resolved, Sodium currently 135, continue monitor BMP -TSH 03/30/2015 1.6 -Continue IVF  Hypokalemia -Was not able to take oral supplements yesterday, continued to have hypokalemia.  Hypothyroidism -Continue Synthroid -TSH 03/30/2015 1.6  Essential hypertension -Blood pressure currently stable -Losartan and spironolatone/HCTZ held  Hyperlipidemia -Patient no longer taking statin, should follow up with her primary care physician   Adenocarcinoma liver -Status post hepatic lobectomy 06/14/2015 -Stable  -General surgery consulted and appreciated  Physical deconditioning -PT rec SNF  Code Status: Full  Family Communication: Daughters at bedside  Disposition Plan: Admitted, Pending IR for drain placement and SNF  Time Spent in minutes 30 minutes  Procedures  None  Consults  General surgery Interventional radiology   DVT Prophylaxis Lovenox  Lab Results  Component Value Date   PLT 219 07/07/2015    Medications  Scheduled Meds: . antiseptic oral rinse  7 mL Mouth Rinse  BID  . aspirin  81 mg Oral Daily  . enoxaparin (LOVENOX) injection  40 mg Subcutaneous Q24H  . insulin aspart  0-15 Units Subcutaneous 4 times per day  . levothyroxine  50 mcg Intravenous Daily  . piperacillin-tazobactam (ZOSYN)  IV  3.375 g Intravenous Q8H  . potassium phosphate IVPB (mmol)  20 mmol Intravenous Once  . sodium chloride  10-40 mL Intracatheter Q12H   Continuous Infusions: . sodium chloride 35 mL/hr at 07/07/15 0130  . Marland KitchenTPN (CLINIMIX-E) Adult 40 mL/hr at 07/07/15 1810   And  . fat emulsion 240 mL (07/07/15 1810)  . Marland KitchenTPN (CLINIMIX-E)  Adult     And  . fat emulsion     PRN Meds:.acetaminophen **OR** acetaminophen, alum & mag hydroxide-simeth, HYDROmorphone (DILAUDID) injection, LORazepam, ondansetron **OR** ondansetron (ZOFRAN) IV, oxyCODONE, sodium chloride  Antibiotics    Anti-infectives    Start     Dose/Rate Route Frequency Ordered Stop   07/05/15 0815  piperacillin-tazobactam (ZOSYN) IVPB 3.375 g     3.375 g 12.5 mL/hr over 240 Minutes Intravenous Every 8 hours 07/05/15 0813     07/04/15 2000  cefTRIAXone (ROCEPHIN) 1 g in dextrose 5 % 50 mL IVPB  Status:  Discontinued     1 g 100 mL/hr over 30 Minutes Intravenous Every 24 hours 07/04/15 1255 07/05/15 0805   07/02/15 1200  aztreonam (AZACTAM) 500 mg in dextrose 5 % 50 mL IVPB  Status:  Discontinued     500 mg 100 mL/hr over 30 Minutes Intravenous Every 8 hours 07/02/15 0545 07/04/15 1255   07/02/15 0400  aztreonam (AZACTAM) 1 g in dextrose 5 % 50 mL IVPB     1 g 100 mL/hr over 30 Minutes Intravenous  Once 07/02/15 0346 07/02/15 0418       Objective:   Filed Vitals:   07/07/15 1340 07/07/15 2131 07/08/15 0147 07/08/15 0519  BP: 151/67 153/63 142/51 155/55  Pulse: 79 79 73 76  Temp: 98.1 F (36.7 C) 98.4 F (36.9 C) 98.1 F (36.7 C) 97.9 F (36.6 C)  TempSrc: Oral Oral Oral Oral  Resp: '18 18 16 16  '$ Height:      Weight:      SpO2: 99% 96% 97% 96%    Wt Readings from Last 3 Encounters:  07/02/15 62.869 kg (138 lb 9.6 oz)  06/14/15 65.318 kg (144 lb)  06/22/15 65.318 kg (144 lb)     Intake/Output Summary (Last 24 hours) at 07/08/15 1312 Last data filed at 07/08/15 0636  Gross per 24 hour  Intake 2000.08 ml  Output     86 ml  Net 1914.08 ml    Exam  General: Well developed, well nourished, NAD  HEENT: NCAT,  mucous membranes moist.   Cardiovascular: S1 S2 auscultated, RRR, no murmurs  Respiratory: Clear to auscultation   Abdomen: Soft, nontender, nondistended, + bowel sounds  Extremities: warm dry without cyanosis clubbing or  edema  Neuro: AAOx3, nonfocal  Psych: Normal affect and demeanor, somewhat irritable today  Data Review   Micro Results Recent Results (from the past 240 hour(s))  Blood Culture (routine x 2)     Status: None   Collection Time: 07/02/15 12:20 AM  Result Value Ref Range Status   Specimen Description BLOOD LEFT ANTECUBITAL  Final   Special Requests BOTTLES DRAWN AEROBIC AND ANAEROBIC 5ML  Final   Culture   Final    NO GROWTH 5 DAYS Performed at [facility]    Report Status 07/07/2015 FINAL  Final  Blood Culture (routine x 2)     Status: None   Collection Time: 07/02/15 12:27 AM  Result Value Ref Range Status   Specimen Description BLOOD LEFT HAND  Final   Special Requests IN PEDIATRIC BOTTLE 5CC  Final   Culture   Final    NO GROWTH 5 DAYS Performed at [facility]    Report Status 07/07/2015 FINAL  Final  Urine culture     Status: None   Collection Time: 07/02/15  1:41 AM  Result Value Ref Range Status   Specimen Description URINE, CLEAN CATCH  Final   Special Requests NONE  Final   Culture   Final    >=100,000 COLONIES/mL ESCHERICHIA COLI Performed at [facility]    Report Status 07/04/2015 FINAL  Final   Organism ID, Bacteria ESCHERICHIA COLI  Final      Susceptibility   Escherichia coli - MIC*    AMPICILLIN >=32 RESISTANT Resistant     CEFAZOLIN <=4 SENSITIVE Sensitive     CEFTRIAXONE <=1 SENSITIVE Sensitive     CIPROFLOXACIN >=4 RESISTANT Resistant     GENTAMICIN <=1 SENSITIVE Sensitive     IMIPENEM 0.5 SENSITIVE Sensitive     NITROFURANTOIN <=16 SENSITIVE Sensitive     TRIMETH/SULFA <=20 SENSITIVE Sensitive     AMPICILLIN/SULBACTAM 16 INTERMEDIATE Intermediate     PIP/TAZO 8 SENSITIVE Sensitive     * >=100,000 COLONIES/mL ESCHERICHIA COLI  Culture, routine-abscess     Status: None (Preliminary result)   Collection Time: 07/05/15  3:14 PM  Result Value Ref Range Status   Specimen Description ABSCESS ABDOMEN RIGHT  Final   Special  Requests NONE  Final   Gram Stain   Final    RARE WBC PRESENT, PREDOMINANTLY PMN NO SQUAMOUS EPITHELIAL CELLS SEEN NO ORGANISMS SEEN Performed at [facility]    Culture   Final    NO GROWTH 2 DAYS Performed at [facility]    Report Status PENDING  Incomplete    Radiology Reports Dg Chest 2 View  07/02/2015  CLINICAL DATA:  Right upper quadrant pain. Cough with swallowing. Leukocytosis. EXAM: CHEST  2 VIEW COMPARISON:  PET-CT 05/17/2015 FINDINGS: Heart at the upper limits of normal in size, atherosclerosis of the thoracic aorta. Linear opacity in the right perihilar lung consistent with atelectasis. No consolidation to suggest pneumonia. Pulmonary vasculature is normal. No consolidation, pleural effusion, or pneumothorax. No acute osseous abnormalities are seen. IMPRESSION: Linear right perihilar atelectasis.  No pneumonia. Electronically Signed   By: Jeb Levering M.D.   On: 07/02/2015 00:47   Ct Head Wo Contrast  06/16/2015  CLINICAL DATA:  Mild facial droop, no other neurological deficits EXAM: CT HEAD WITHOUT CONTRAST TECHNIQUE: Contiguous axial images were obtained from the base of the skull through the vertex without intravenous contrast. COMPARISON:  None. FINDINGS: Diffuse age-related cortical atrophy as well as diffuse low attenuation in the deep white matter. There is no evidence of vascular territory cortical infarct. There is no evidence of mass. There is no hemorrhage or extra-axial fluid. There is no hydrocephalus. Sub cm focus of low attenuation left pons. Sub cm calcified meningioma right frontal region. Calvarium intact. No significant inflammatory change in the visualized portions of the paranasal sinuses. IMPRESSION: Chronic involutional change with no hemorrhage or vascular territory infarct. Tiny focus of low attenuation left pons measuring a few mm may reflect another focus of chronic white matter degeneration versus tiny lacunar infarct of uncertain age.  This  could be better evaluated with MRI if indicated. Electronically Signed   By: Skipper Cliche M.D.   On: 06/16/2015 08:38   Mr Jeri Cos OI Contrast  06/22/2015  CLINICAL DATA:  Abnormal CT scan of the head. EXAM: MRI HEAD WITHOUT AND WITH CONTRAST TECHNIQUE: Multiplanar, multiecho pulse sequences of the brain and surrounding structures were obtained without and with intravenous contrast. CONTRAST:  64m MULTIHANCE GADOBENATE DIMEGLUMINE 529 MG/ML IV SOLN COMPARISON:  CT head without contrast FINDINGS: Moderate generalized atrophy and white matter changes are present bilaterally. No acute infarct, hemorrhage, or mass lesion is present. The ventricles are of normal size. The ventricles are proportionate to the degree of atrophy. No significant extra-axial fluid collection is present. Is brainstem and cerebellum are within normal limits. Flow is present in the major intracranial arteries. Bilateral lens replacements are present. The globes and orbits are otherwise intact. A single posterior right ethmoid air cell is opacified. The remaining paranasal sinuses and mastoid air cells are clear. Postcontrast images demonstrate no pathologic enhancement. IMPRESSION: 1. Moderate atrophy and white matter disease. This likely reflects the sequela of chronic microvascular ischemia. 2. No acute infarct. Electronically Signed   By: CSan MorelleM.D.   On: 06/22/2015 19:50   UKoreaAbscess Drain  07/05/2015  CLINICAL DATA:  81year old female with a history of poorly differentiated carcinoma of the right liver lobe. Status post resection, has developed what appears to be abscess of the surgical site within the right liver lobe. Differential diagnosis includes biloma and abscess. EXAM: ULTRASOUND GUIDED DRAIN PLACEMENT OF RIGHT LIVER LOBE MEDICATIONS: No sedation. PROCEDURE: The procedure, risks, benefits, and alternatives were explained to the patient. Questions regarding the procedure were encouraged and answered. The  patient understands and consents to the procedure. Ultrasound survey of the right upper quadrant was performed. Images were stored and sent to PACs. The right upper quadrant anterior wall was prepped with Betadine in a sterile fashion, and a sterile drape was applied covering the operative field. A sterile gown and sterile gloves were used for the procedure. Local anesthesia was provided with 1% Lidocaine. Once the patient is prepped and draped in the usual sterile fashion, the skin and subcutaneous tissues were generously infiltrated with 1% lidocaine for local anesthesia. A small stab incision was made with 11 blade scalpel. Trocar technique was used to advance a 10 FPakistandrain in a transhepatic trajectory into the fluid collection of the rib surgical site and right upper abdomen. Bilious drainage was returned. Sample was sent to the lab. Pigtail catheter was formed and a final image was stored. Patient tolerated the procedure well and remained hemodynamically stable throughout. No complications encountered and no significant blood loss encountered. Drain was sutured into position and left to gravity drainage. COMPLICATIONS: None. FINDINGS: Ultrasound survey demonstrates complex fluid collection within the right upper quadrant with hyperechoic central aspect, which may represent fat necrosis or omental infarct. Bilious fluid was easily aspirated once the drain was in place. IMPRESSION: Status post ultrasound-guided drainage of biloma of the surgical site. Ten FPakistandrain placed. Signed, JDulcy Fanny WEarleen Newport DO [facility] Electronically Signed   By: JCorrie MckusickD.O.   On: 07/05/2015 17:59   Ct Abdomen Pelvis W Contrast  07/04/2015  CLINICAL DATA:  Three weeks status post resection of hepatic adenocarcinoma. Nausea. Persistent leukocytosis. EXAM: CT ABDOMEN AND PELVIS WITH CONTRAST TECHNIQUE: Multidetector CT imaging of the abdomen and pelvis was  performed using the standard protocol following bolus  administration of intravenous contrast. CONTRAST:  153m OMNIPAQUE IOHEXOL 300 MG/ML  SOLN COMPARISON:  05/03/2015 FINDINGS: Lower chest: Small bilateral pleural effusions noted as well as tiny pericardial effusion. Mild wall thickening of distal thoracic esophagus is suspicious for esophagitis. Hepatobiliary: There has been interval resection of previously seen right hepatic lobe mass. There is an irregular rim enhancing fluid collection within the surgical bed in the right hepatic lobe which measures 8.1 x 8.8 cm on image 32/series 3. This could be due to postop fluid collection or abscess. No other liver lesions are identified. A small rim enhancing perihepatic fluid collection is also seen adjacent to the anterior right hepatic lobe which measures 1.8 x 7.8 cm on image 26/ series 3. This could also represent postop fluid collection or abscess. Prior cholecystectomy again noted. Dilatation of extrahepatic common bile duct remains stable. Pancreas: No mass, inflammatory changes, or other significant abnormality. Spleen: Within normal limits in size and appearance. Adrenals/Urinary Tract: No masses identified. No evidence of hydronephrosis. Mild diffuse bladder wall thickening noted as well as mild dilatation, possibly due to neurogenic bladder. Stomach/Bowel: Diverticulitis near the junction of the descending and sigmoid colon shows improvement since previous study. Vascular/Lymphatic: No pathologically enlarged lymph nodes. No evidence of abdominal aortic aneurysm. Reproductive: Previous hysterectomy a 3.8 cm cyst with a tiny posterior mural calcification is seen in the left adnexa on image 96 98/series 3 which is not significant changed since previous study. No definite malignant features identified. This could represent a diverticular abscess or cystic lesion of left ovary. Other: Small amount of free fluid in pelvic cul-de-sac. Musculoskeletal: No  suspicious bone lesions identified. Spondylosis grade 2 anterolisthesis noted at L4-5. IMPRESSION: 9 cm rim enhancing fluid collection in right hepatic lobe surgical bed, and smaller perihepatic fluid collection adjacent to right lobe. Differential diagnosis includes postop fluid collections and abscess. Improving diverticulitis. Persistent 3.8 cm left lower quadrant fluid collection, which could represent a diverticular abscess or left ovarian cystic lesion. No malignant features identified. Recommend continued attention on follow-up imaging. Small bilateral pleural effusions, tiny pericardial effusion, and small amount free fluid in pelvic cul-de-sac. Distal esophageal wall thickening, suspicious for esophagitis. Mildly distended urinary bladder with diffuse wall thickening, suspicious for neurogenic bladder. Electronically Signed   By: JEarle GellM.D.   On: 07/04/2015 15:15    CBC  Recent Labs Lab 07/02/15 0026  07/03/15 0443 07/04/15 0444 07/05/15 0415 07/06/15 0428 07/07/15 0530  WBC 14.5*  < > 11.7* 9.3 14.5* 13.3* 12.6*  HGB 11.4*  < > 11.0* 10.6* 11.0* 10.7* 9.5*  HCT 33.9*  < > 33.3* 31.8* 33.5* 32.6* 28.7*  PLT 386  < > 347 243 302 269 219  MCV 84.5  < > 86.3 85.9 85.9 86.0 84.2  MCH 28.4  < > 28.5 28.6 28.2 28.2 27.9  MCHC 33.6  < > 33.0 33.3 32.8 32.8 33.1  RDW 13.4  < > 13.8 13.7 13.8 13.8 13.6  LYMPHSABS 1.7  --   --   --   --   --  1.6  MONOABS 1.4*  --   --   --   --   --  1.1*  EOSABS 0.6  --   --   --   --   --  0.8*  BASOSABS 0.1  --   --   --   --   --  0.1  < > = values in this interval not displayed.  Chemistries   Recent Labs Lab 07/02/15  2956  07/04/15 0444 07/05/15 0415 07/06/15 0428 07/07/15 0530 07/08/15 0425  NA 132*  < > 135 135 139 138 136  K 4.0  < > 3.6 3.5 3.2* 3.3* 3.7  CL 97*  < > 106 106 110 108 105  CO2 28  < > '26 23 24 26 25  '$ GLUCOSE 144*  < > 115* 112* 109* 136* 128*  BUN 41*  < > '14 9 8 8 9  '$ CREATININE 1.82*  < > 0.89 0.74 0.87 0.74  0.88  CALCIUM 11.0*  < > 9.1 9.0 8.7* 8.6* 8.7*  MG  --   --   --   --   --  1.5* 1.9  AST 24  --   --   --   --  14*  --   ALT 17  --   --   --   --  13*  --   ALKPHOS 84  --   --   --   --  66  --   BILITOT 0.6  --   --   --   --  0.5  --   < > = values in this interval not displayed. ------------------------------------------------------------------------------------------------------------------ estimated creatinine clearance is 40.2 mL/min (by C-G formula based on Cr of 0.88). ------------------------------------------------------------------------------------------------------------------ No results for input(s): HGBA1C in the last 72 hours. ------------------------------------------------------------------------------------------------------------------  Recent Labs  07/07/15 0545  TRIG 83   ------------------------------------------------------------------------------------------------------------------ No results for input(s): TSH, T4TOTAL, T3FREE, THYROIDAB in the last 72 hours.  Invalid input(s): FREET3 ------------------------------------------------------------------------------------------------------------------ No results for input(s): VITAMINB12, FOLATE, FERRITIN, TIBC, IRON, RETICCTPCT in the last 72 hours.  Coagulation profile  Recent Labs Lab 07/05/15 0415  INR 1.13    No results for input(s): DDIMER in the last 72 hours.  Cardiac Enzymes No results for input(s): CKMB, TROPONINI, MYOGLOBIN in the last 168 hours.  Invalid input(s): CK ------------------------------------------------------------------------------------------------------------------ Invalid input(s): POCBNP    Kemoni Quesenberry A MD. on 07/08/2015 at 1:12 PM  Between 7am to 7pm - Pager - [phone]  After 7pm go to [email or web address] - password [email or web address]  And look for the night coverage person covering for me after hours  [facility] Office  [phone]

## 2015-07-08 NOTE — Progress Notes (Signed)
Patient ID: Destiny Velasquez, female   DOB: 10-Nov-1925, 81 y.o.   MRN: [id number]    Subjective: Regurgitation is better.  Hungry.   Objective: Vital signs in last 24 hours: Temp:  [97.9 F (36.6 C)-98.4 F (36.9 C)] 97.9 F (36.6 C) (11/25 0519) Pulse Rate:  [73-79] 76 (11/25 0519) Resp:  [16-18] 16 (11/25 0519) BP: (142-155)/(51-67) 155/55 mmHg (11/25 0519) SpO2:  [96 %-99 %] 96 % (11/25 0519) Last BM Date: 07/07/15  Intake/Output from previous day: 11/24 0701 - 11/25 0700 In: 2240.1 [P.O.:360; I.V.:796.9; IV Piggyback:150; TPN:928.2] Out: 86 [Drains:85; Stool:1] Intake/Output this shift:    PE: General- In NAD Abdomen-soft, non distended.  Non tender.  Drain bilious.    Lab Results:   Recent Labs  07/06/15 0428 07/07/15 0530  WBC 13.3* 12.6*  HGB 10.7* 9.5*  HCT 32.6* 28.7*  PLT 269 219   BMET  Recent Labs  07/07/15 0530 07/08/15 0425  NA 138 136  K 3.3* 3.7  CL 108 105  CO2 26 25  GLUCOSE 136* 128*  BUN 8 9  CREATININE 0.74 0.88  CALCIUM 8.6* 8.7*   PT/INR No results for input(s): LABPROT, INR in the last 72 hours. Comprehensive Metabolic Panel:    Component Value Date/Time   NA 136 07/08/2015 0425   NA 138 07/07/2015 0530   NA 139 03/30/2015 0813   NA 139 09/20/2014 0816   K 3.7 07/08/2015 0425   K 3.3* 07/07/2015 0530   CL 105 07/08/2015 0425   CL 108 07/07/2015 0530   CO2 25 07/08/2015 0425   CO2 26 07/07/2015 0530   BUN 9 07/08/2015 0425   BUN 8 07/07/2015 0530   BUN 18 03/30/2015 0813   BUN 18 09/20/2014 0816   CREATININE 0.88 07/08/2015 0425   CREATININE 0.74 07/07/2015 0530   GLUCOSE 128* 07/08/2015 0425   GLUCOSE 136* 07/07/2015 0530   GLUCOSE 119* 03/30/2015 0813   GLUCOSE 132* 09/20/2014 0816   CALCIUM 8.7* 07/08/2015 0425   CALCIUM 8.6* 07/07/2015 0530   AST 14* 07/07/2015 0530   AST 24 07/02/2015 0026   ALT 13* 07/07/2015 0530   ALT 17 07/02/2015 0026   ALKPHOS 66 07/07/2015 0530   ALKPHOS 84 07/02/2015 0026   BILITOT 0.5  07/07/2015 0530   BILITOT 0.6 07/02/2015 0026   BILITOT 0.3 09/20/2014 0816   PROT 5.1* 07/07/2015 0530   PROT 6.9 07/02/2015 0026   PROT 6.6 09/20/2014 0816   PROT 6.8 03/17/2014 0807   ALBUMIN 2.0* 07/07/2015 0530   ALBUMIN 3.0* 07/02/2015 0026   ALBUMIN 3.9 09/20/2014 0816   ALBUMIN 3.9 03/17/2014 0807     Studies/Results: No results found.  Anti-infectives: Anti-infectives    Start     Dose/Rate Route Frequency Ordered Stop   07/05/15 0815  piperacillin-tazobactam (ZOSYN) IVPB 3.375 g     3.375 g 12.5 mL/hr over 240 Minutes Intravenous Every 8 hours 07/05/15 0813     07/04/15 2000  cefTRIAXone (ROCEPHIN) 1 g in dextrose 5 % 50 mL IVPB  Status:  Discontinued     1 g 100 mL/hr over 30 Minutes Intravenous Every 24 hours 07/04/15 1255 07/05/15 0805   07/02/15 1200  aztreonam (AZACTAM) 500 mg in dextrose 5 % 50 mL IVPB  Status:  Discontinued     500 mg 100 mL/hr over 30 Minutes Intravenous Every 8 hours 07/02/15 0545 07/04/15 1255   07/02/15 0400  aztreonam (AZACTAM) 1 g in dextrose 5 % 50 mL IVPB  1 g 100 mL/hr over 30 Minutes Intravenous  Once 07/02/15 0346 07/02/15 0418      Assessment Principal Problem:   Pyelonephritis   Postop ileus   Postop biloma   Failure to thrive   Moderate PC malnutrition   Adenocarcinoma of liver s/p partial right hepatic lobectomy 06/14/2015         LOS: 6 days   Plan:  Advance to soft diet.  Regurgitation is better.   Biliary drain.    [facility] 07/08/2015

## 2015-07-08 NOTE — Progress Notes (Signed)
Patient ID: Destiny Velasquez, female   DOB: [birth year], 81 y.o.   MRN: [id number]         Subjective:  Pt states she is hungry; denies sig abd pain,N/V  Allergies: Review of patient's allergies indicates no known allergies.  Medications: Prior to Admission medications   Medication Sig Start Date End Date Taking? Authorizing Provider  acetaminophen (TYLENOL) 325 MG tablet Take 1-2 tablets (325-650 mg total) by mouth every 6 (six) hours as needed for fever, headache, mild pain or moderate pain. 06/22/15  Yes [name], MD  alum & mag hydroxide-simeth (MAALOX/MYLANTA) 200-200-20 MG/5ML suspension Take 30 mLs by mouth every 6 (six) hours as needed for indigestion or heartburn (or bloating). 06/22/15  Yes [name], MD  aspirin 81 MG tablet Take 81 mg by mouth daily.   Yes Historical Provider, MD  ibuprofen (ADVIL,MOTRIN) 400 MG tablet Take 1-2 tablets (400-800 mg total) by mouth every 12 (twelve) hours as needed for fever or headache. 06/22/15  Yes [name], MD  levothyroxine (SYNTHROID, LEVOTHROID) 100 MCG tablet TAKE ONE TABLET BY MOUTH 30 MINUTES BEFORE BREAKFAST EVERY MORNING. SEPARATE FROM OTHER MEDICATIONS FOR THYROID. 12/20/14  Yes [name] L [name], DO  LORazepam (ATIVAN) 0.5 MG tablet Take 1 tablet (0.5 mg total) by mouth every 6 (six) hours as needed for anxiety. Patient taking differently: Take 0.5 mg by mouth every 6 (six) hours as needed for anxiety. Pt does take in prn through out the day but takes it every night at 9pm 05/09/15  Yes [name] L [name], DO  losartan (COZAAR) 50 MG tablet Take one tablet by mouth once daily for blood pressure Patient taking differently: Take 50 mg by mouth daily. Take one tablet by mouth once daily for blood pressure 02/28/15  Yes [name] L [name], DO  ondansetron (ZOFRAN) 4 MG tablet Take 1 tablet (4 mg total) by mouth every 8 (eight) hours as needed for nausea or vomiting. 05/09/15  Yes [name] L [name], DO  OVER THE COUNTER MEDICATION Take 1 Container by  mouth 3 (three) times daily. Med pass   Yes Historical Provider, MD  polyethylene glycol (MIRALAX / GLYCOLAX) packet Take 17 g by mouth daily.    Yes Historical Provider, MD  Probiotic Product (PROBIOTIC DAILY PO) Take 1 tablet by mouth daily.    Yes Historical Provider, MD  simethicone (MYLICON) 614 MG chewable tablet Chew 125 mg by mouth 3 (three) times daily.   Yes Historical Provider, MD  spironolactone-hydrochlorothiazide (ALDACTAZIDE) 25-25 MG per tablet TAKE 1/2 TABLET BY MOUTH ONCE DAILY 04/11/15  Yes [name] L [name], DO  estrogens, conjugated, (PREMARIN) 0.3 MG tablet Take one tablet every other day for one month Patient not taking: Reported on 07/01/2015 03/18/14   [name] L [name], DO  feeding supplement (BOOST / RESOURCE BREEZE) LIQD Take 1 Container by mouth 3 (three) times daily between meals. Patient not taking: Reported on 06/08/2015 05/06/15   Flonnie Overman Dhungel, MD  HYDROcodone-acetaminophen (NORCO/VICODIN) 5-325 MG tablet Take 1 tablet by mouth every 6 (six) hours as needed for moderate pain. 07/04/15   Maryann Mikhail, DO  levofloxacin (LEVAQUIN) 500 MG tablet Take 1 tablet (500 mg total) by mouth daily. Patient not taking: Reported on 07/01/2015 06/23/15   [name], MD  shark liver oil-cocoa butter (PREPARATION H) 0.25-3-85.5 % suppository Place 1 suppository rectally as needed for hemorrhoids.    Historical Provider, MD  simethicone (MYLICON) 80 MG chewable tablet Chew 1 tablet (80 mg total) by mouth 4 (four) times daily  as needed for flatulence. Patient not taking: Reported on 07/01/2015 06/22/15   [name], MD  simvastatin (ZOCOR) 40 MG tablet Take 1 tablet (40 mg total) by mouth every other day. Patient not taking: Reported on 07/01/2015 04/14/15   [name] L [name], DO  traMADol (ULTRAM) 50 MG tablet Take 1 tablet (50 mg total) by mouth every 6 (six) hours as needed for moderate pain or severe pain. 07/04/15   Maryann Mikhail, DO     Vital Signs: BP 155/55 mmHg  Pulse 76   Temp(Src) 97.9 F (36.6 C) (Oral)  Resp 16  Ht '5\' 3"'$  (1.6 m)  Wt 138 lb 9.6 oz (62.869 kg)  BMI 24.56 kg/m2  SpO2 96%  Physical Exam RUQ drain intact, insertion site ok, mildly tender, output 85 cc bilious fluid; cx's neg to date; drain irrigated without difficulty  Imaging: [place] Abscess Drain  07/05/2015  CLINICAL DATA:  81 year old female with a history of poorly differentiated carcinoma of the right liver lobe. Status post resection, has developed what appears to be abscess of the surgical site within the right liver lobe. Differential diagnosis includes biloma and abscess. EXAM: ULTRASOUND GUIDED DRAIN PLACEMENT OF RIGHT LIVER LOBE MEDICATIONS: No sedation. PROCEDURE: The procedure, risks, benefits, and alternatives were explained to the patient. Questions regarding the procedure were encouraged and answered. The patient understands and consents to the procedure. Ultrasound survey of the right upper quadrant was performed. Images were stored and sent to PACs. The right upper quadrant anterior wall was prepped with Betadine in a sterile fashion, and a sterile drape was applied covering the operative field. A sterile gown and sterile gloves were used for the procedure. Local anesthesia was provided with 1% Lidocaine. Once the patient is prepped and draped in the usual sterile fashion, the skin and subcutaneous tissues were generously infiltrated with 1% lidocaine for local anesthesia. A small stab incision was made with 11 blade scalpel. Trocar technique was used to advance a 10 [place] drain in a transhepatic trajectory into the fluid collection of the rib surgical site and right upper abdomen. Bilious drainage was returned. Sample was sent to the lab. Pigtail catheter was formed and a final image was stored. Patient tolerated the procedure well and remained hemodynamically stable throughout. No complications encountered and no significant blood loss encountered. Drain was sutured into position and left  to gravity drainage. COMPLICATIONS: None. FINDINGS: Ultrasound survey demonstrates complex fluid collection within the right upper quadrant with hyperechoic central aspect, which may represent fat necrosis or omental infarct. Bilious fluid was easily aspirated once the drain was in place. IMPRESSION: Status post ultrasound-guided drainage of biloma of the surgical site. Ten [place] drain placed. Signed, Dulcy Fanny. Earleen Newport, DO [facility] Electronically Signed   By: Corrie Mckusick D.O.   On: 07/05/2015 17:59   Ct Abdomen Pelvis W Contrast  07/04/2015  CLINICAL DATA:  Three weeks status post resection of hepatic adenocarcinoma. Nausea. Persistent leukocytosis. EXAM: CT ABDOMEN AND PELVIS WITH CONTRAST TECHNIQUE: Multidetector CT imaging of the abdomen and pelvis was performed using the standard protocol following bolus administration of intravenous contrast. CONTRAST:  173m OMNIPAQUE IOHEXOL 300 MG/ML  SOLN COMPARISON:  05/03/2015 FINDINGS: Lower chest: Small bilateral pleural effusions noted as well as tiny pericardial effusion. Mild wall thickening of distal thoracic esophagus is suspicious for esophagitis. Hepatobiliary: There has been interval resection of previously seen right hepatic lobe mass. There is an irregular rim enhancing fluid collection within the surgical bed in  the right hepatic lobe which measures 8.1 x 8.8 cm on image 32/series 3. This could be due to postop fluid collection or abscess. No other liver lesions are identified. A small rim enhancing perihepatic fluid collection is also seen adjacent to the anterior right hepatic lobe which measures 1.8 x 7.8 cm on image 26/ series 3. This could also represent postop fluid collection or abscess. Prior cholecystectomy again noted. Dilatation of extrahepatic common bile duct remains stable. Pancreas: No mass, inflammatory changes, or other significant abnormality. Spleen: Within normal limits in  size and appearance. Adrenals/Urinary Tract: No masses identified. No evidence of hydronephrosis. Mild diffuse bladder wall thickening noted as well as mild dilatation, possibly due to neurogenic bladder. Stomach/Bowel: Diverticulitis near the junction of the descending and sigmoid colon shows improvement since previous study. Vascular/Lymphatic: No pathologically enlarged lymph nodes. No evidence of abdominal aortic aneurysm. Reproductive: Previous hysterectomy a 3.8 cm cyst with a tiny posterior mural calcification is seen in the left adnexa on image 96 98/series 3 which is not significant changed since previous study. No definite malignant features identified. This could represent a diverticular abscess or cystic lesion of left ovary. Other: Small amount of free fluid in pelvic cul-de-sac. Musculoskeletal: No suspicious bone lesions identified. Spondylosis grade 2 anterolisthesis noted at L4-5. IMPRESSION: 9 cm rim enhancing fluid collection in right hepatic lobe surgical bed, and smaller perihepatic fluid collection adjacent to right lobe. Differential diagnosis includes postop fluid collections and abscess. Improving diverticulitis. Persistent 3.8 cm left lower quadrant fluid collection, which could represent a diverticular abscess or left ovarian cystic lesion. No malignant features identified. Recommend continued attention on follow-up imaging. Small bilateral pleural effusions, tiny pericardial effusion, and small amount free fluid in pelvic cul-de-sac. Distal esophageal wall thickening, suspicious for esophagitis. Mildly distended urinary bladder with diffuse wall thickening, suspicious for neurogenic bladder. Electronically Signed   By: Earle Gell M.D.   On: 07/04/2015 15:15    Labs:  CBC:  Recent Labs  07/04/15 0444 07/05/15 0415 07/06/15 0428 07/07/15 0530  WBC 9.3 14.5* 13.3* 12.6*  HGB 10.6* 11.0* 10.7* 9.5*  HCT 31.8* 33.5* 32.6* 28.7*  PLT 243 302 269 219    COAGS:  Recent  Labs  05/04/15 1223 06/13/15 0830 06/15/15 0510 07/05/15 0415  INR 1.20 1.08  --  1.13  APTT  --   --  28  --     BMP:  Recent Labs  07/05/15 0415 07/06/15 0428 07/07/15 0530 07/08/15 0425  NA 135 139 138 136  K 3.5 3.2* 3.3* 3.7  CL 106 110 108 105  CO2 '23 24 26 25  '$ GLUCOSE 112* 109* 136* 128*  BUN '9 8 8 9  '$ CALCIUM 9.0 8.7* 8.6* 8.7*  CREATININE 0.74 0.87 0.74 0.88  GFRNONAA >60 58* >60 57*  GFRAA >60 >60 >60 >60    LIVER FUNCTION TESTS:  Recent Labs  06/22/15 0343 06/23/15 0850 07/02/15 0026 07/07/15 0530  BILITOT 1.3* 1.1 0.6 0.5  AST '21 22 24 '$ 14*  ALT 55* 45 17 13*  ALKPHOS 89 90 84 66  PROT 6.2* 6.6 6.9 5.1*  ALBUMIN 3.3* 3.1* 3.0* 2.0*    Assessment and Plan:  S/p RUQ biloma drainage 11/22 (prior hx partial rt hepatectomy for adenoca 06/14/15); afebrile; creat 0.88, K 3.7, fluid cx's neg to date; cont current tx ; diet per CCS; check f/u CT/drain injection next week  Signed: D. Rowe Robert 07/08/2015, 12:11 PM   I spent a total of 15 minutes at  the the patient's bedside AND on the patient's hospital floor or unit, greater than 50% of which was counseling/coordinating care for RUQ fluid collection drainage

## 2015-07-08 NOTE — Progress Notes (Signed)
ANTIBIOTIC CONSULT NOTE - INITIAL  Pharmacy Consult for Zosyn Indication: Intra-abdominal abscess  No Known Allergies  Patient Measurements: Height: '5\' 3"'$  (160 cm) Weight: 138 lb 9.6 oz (62.869 kg) IBW/kg (Calculated) : 52.4  Vital Signs: Temp: 97.9 F (36.6 C) (11/25 0519) Temp Source: Oral (11/25 0519) BP: 155/55 mmHg (11/25 0519) Pulse Rate: 76 (11/25 0519) Intake/Output from previous day: 11/24 0701 - 11/25 0700 In: 2240.1 [P.O.:360; I.V.:796.9; IV Piggyback:150; TPN:928.2] Out: 86 [Drains:85; Stool:1] Intake/Output from this shift:    Labs:  Recent Labs  07/06/15 0428 07/07/15 0530 07/08/15 0425  WBC 13.3* 12.6*  --   HGB 10.7* 9.5*  --   PLT 269 219  --   CREATININE 0.87 0.74 0.88   Estimated Creatinine Clearance: 40.2 mL/min (by C-G formula based on Cr of 0.88). No results for input(s): VANCOTROUGH, VANCOPEAK, VANCORANDOM, GENTTROUGH, GENTPEAK, GENTRANDOM, TOBRATROUGH, TOBRAPEAK, TOBRARND, AMIKACINPEAK, AMIKACINTROU, AMIKACIN in the last 72 hours.   Microbiology: Recent Results (from the past 720 hour(s))  MRSA PCR Screening     Status: None   Collection Time: 06/14/15  6:41 PM  Result Value Ref Range Status   MRSA by PCR NEGATIVE NEGATIVE Final    Comment:        The GeneXpert MRSA Assay (FDA approved for NASAL specimens only), is one component of a comprehensive MRSA colonization surveillance program. It is not intended to diagnose MRSA infection nor to guide or monitor treatment for MRSA infections.   Blood Culture (routine x 2)     Status: None   Collection Time: 07/02/15 12:20 AM  Result Value Ref Range Status   Specimen Description BLOOD LEFT ANTECUBITAL  Final   Special Requests BOTTLES DRAWN AEROBIC AND ANAEROBIC 5ML  Final   Culture   Final    NO GROWTH 5 DAYS Performed at [facility]    Report Status 07/07/2015 FINAL  Final  Blood Culture (routine x 2)     Status: None   Collection Time: 07/02/15 12:27 AM  Result Value Ref  Range Status   Specimen Description BLOOD LEFT HAND  Final   Special Requests IN PEDIATRIC BOTTLE 5CC  Final   Culture   Final    NO GROWTH 5 DAYS Performed at [facility]    Report Status 07/07/2015 FINAL  Final  Urine culture     Status: None   Collection Time: 07/02/15  1:41 AM  Result Value Ref Range Status   Specimen Description URINE, CLEAN CATCH  Final   Special Requests NONE  Final   Culture   Final    >=100,000 COLONIES/mL ESCHERICHIA COLI Performed at [facility]    Report Status 07/04/2015 FINAL  Final   Organism ID, Bacteria ESCHERICHIA COLI  Final      Susceptibility   Escherichia coli - MIC*    AMPICILLIN >=32 RESISTANT Resistant     CEFAZOLIN <=4 SENSITIVE Sensitive     CEFTRIAXONE <=1 SENSITIVE Sensitive     CIPROFLOXACIN >=4 RESISTANT Resistant     GENTAMICIN <=1 SENSITIVE Sensitive     IMIPENEM 0.5 SENSITIVE Sensitive     NITROFURANTOIN <=16 SENSITIVE Sensitive     TRIMETH/SULFA <=20 SENSITIVE Sensitive     AMPICILLIN/SULBACTAM 16 INTERMEDIATE Intermediate     PIP/TAZO 8 SENSITIVE Sensitive     * >=100,000 COLONIES/mL ESCHERICHIA COLI  Culture, routine-abscess     Status: None (Preliminary result)   Collection Time: 07/05/15  3:14 PM  Result Value Ref Range Status   Specimen  Description ABSCESS ABDOMEN RIGHT  Final   Special Requests NONE  Final   Gram Stain   Final    RARE WBC PRESENT, PREDOMINANTLY PMN NO SQUAMOUS EPITHELIAL CELLS SEEN NO ORGANISMS SEEN Performed at [facility]    Culture   Final    NO GROWTH 2 DAYS Performed at [facility]    Report Status PENDING  Incomplete    Medical History: Past Medical History  Diagnosis Date  . Hypertension   . Hyperlipidemia   . Thyroid disease   . Subacute delirium   . Essential and other specified forms of tremor   . Unspecified hypothyroidism   . Diverticulosis of colon (without mention of hemorrhage)   . Unspecified constipation   . Anal and rectal polyp    . Disorder of bone and cartilage, unspecified   . Pneumonia X 1  . GERD (gastroesophageal reflux disease)   . Malignant neoplasm of breast (female), unspecified site   . Breast cancer, right breast ([place]) 1971    "never had chemo or radiation"  . Liver cancer ([place]) 2016  . Diverticulitis of large intestine without perforation or abscess without bleeding     Medications:  Anti-infectives    Start     Dose/Rate Route Frequency Ordered Stop   07/05/15 0815  piperacillin-tazobactam (ZOSYN) IVPB 3.375 g     3.375 g 12.5 mL/hr over 240 Minutes Intravenous Every 8 hours 07/05/15 0813     07/04/15 2000  cefTRIAXone (ROCEPHIN) 1 g in dextrose 5 % 50 mL IVPB  Status:  Discontinued     1 g 100 mL/hr over 30 Minutes Intravenous Every 24 hours 07/04/15 1255 07/05/15 0805   07/02/15 1200  aztreonam (AZACTAM) 500 mg in dextrose 5 % 50 mL IVPB  Status:  Discontinued     500 mg 100 mL/hr over 30 Minutes Intravenous Every 8 hours 07/02/15 0545 07/04/15 1255   07/02/15 0400  aztreonam (AZACTAM) 1 g in dextrose 5 % 50 mL IVPB     1 g 100 mL/hr over 30 Minutes Intravenous  Once 07/02/15 0346 07/02/15 0418     Assessment: 81 y.o. female s/p R hepatic lobectomy 06/14/15 d/t adenocarcinoma.  Admitted on 11/18 for UTI and treated with aztreonam > CTX, but now with 9 cm fluid collection in hepatic surgical bed - postop fluid collection vs abscess.  MD would like to resume abx for possible abscess as WBC now elevated again.    Patient appeared to be improving on Aztreonam in terms of urinary symptoms and WBC, but unsure if fluid collection was developing during that time.    Given this uncertainty, will opt for empiric abdominal coverage with Zosyn at this point  Hopefully will get cultures from fluid collection with IR drain placement today.  11/19 >>aztreonam >> 11/21 11/21 >> CTX >> 11/22 11/22 >> Zosyn >>  11/19: blood x 2: NG-final 11/19: urine: => 100,000 colonies/mL E.coli (sens: cefazolin,  ceftriaxone, gent, imipenem, NTF, P/T, T/S) 11/22 abscess: no organism on gram stain, NGTD  Temp: afebrile since admission WBC: previously had resolved to WNL, now 12.6 (11/24) Renal: AKI resolved; CrCl 40 CG  Goal of Therapy:  Eradication of infection Appropriate antibiotic dosing for indication and renal function  Plan:  Day 7 antibiotics  Day 4 Zosyn 3.375 g IV given every 8 hrs by 4-hr infusion, no dose change needed  11/22 abscess drain placed- output decreasing, abscess cx no growth  Minda Ditto PharmD Pager [phone] 07/08/2015, 10:55  AM

## 2015-07-08 NOTE — NC FL2 (Signed)
[place] LEVEL OF CARE SCREENING TOOL     IDENTIFICATION  Patient Name: Destiny Velasquez Birthdate: 08-02-1926 Sex: female Admission Date (Current Location): 07/01/2015  [facility] [place] Number: Herbalist and Address:  [facility],  [place] [address], [place]      Provider Number: [id number]  Attending Physician Name and Address:  Verlee Monte, MD  Relative Name and Phone Number:       Current Level of Care: Hospital Recommended Level of Care: [place] Prior Approval Number:    Date Approved/Denied:   PASRR Number: [id number]  Discharge Plan: SNF    Current Diagnoses: Patient Active Problem List   Diagnosis Date Noted  . Abdominal abscess ([place])   . Abscess   . Pyelonephritis 07/02/2015  . Dysphagia 07/02/2015  . Leukocytosis 07/02/2015  . Acute-on-chronic kidney injury ([place]) 07/02/2015  . Hyponatremia 07/02/2015  . Urinary tract infection, site not specified 07/02/2015  . Subacute delirium   . GERD (gastroesophageal reflux disease)   . Adenocarcinoma of liver s/p partial right hepatic lobectomy 06/14/2015 06/14/2015  . At risk for colon cancer 05/17/2015  . Unintentional weight loss 05/06/2015  . Hypothyroidism 05/03/2015  . Essential hypertension 05/03/2015  . Chronic constipation 04/15/2014  . Arthralgia of right knee 01/26/2013  . Arthralgia of right lower leg 01/26/2013  . Hyperlipidemia   . Thyroid disease   . Memory loss   . Disorder of bone and cartilage, unspecified     Orientation ACTIVITIES/SOCIAL BLADDER RESPIRATION    Self, Time, Situation, Place  Active Incontinent Normal  BEHAVIORAL SYMPTOMS/MOOD NEUROLOGICAL BOWEL NUTRITION STATUS   (no behaviors)   Continent Diet (SOFT diet)  PHYSICIAN VISITS COMMUNICATION OF NEEDS Height & Weight Skin    Verbally '5\' 3"'$  (160 cm) 138 lbs. Surgical wounds          AMBULATORY STATUS RESPIRATION    Supervision limited (+1 assist) Normal       Personal Care Assistance Level of Assistance  Bathing, Feeding, Dressing Bathing Assistance: Limited assistance Feeding assistance: Limited assistance Dressing Assistance: Limited assistance      Functional Limitations Info  Sight, Hearing, Speech Sight Info: Adequate Hearing Info: Adequate Speech Info: Adequate       SPECIAL CARE FACTORS FREQUENCY  PT (By licensed PT), OT (By licensed OT)     PT Frequency: 5 x wk OT Frequency: 5 x wk           Additional Factors Info  Code Status Code Status Info: Full Code Allergies Info: NKA           Current Medications (07/08/2015): Current Facility-Administered Medications  Medication Dose Route Frequency Provider Last Rate Last Dose  . 0.9 %  sodium chloride infusion   Intravenous Continuous Verlee Monte, MD 35 mL/hr at 07/07/15 0130    . acetaminophen (TYLENOL) tablet 650 mg  650 mg Oral Q6H PRN Theressa Millard, MD       Or  . acetaminophen (TYLENOL) suppository 650 mg  650 mg Rectal Q6H PRN Theressa Millard, MD      . alum & mag hydroxide-simeth (MAALOX/MYLANTA) 200-200-20 MG/5ML suspension 30 mL  30 mL Oral Q6H PRN Theressa Millard, MD   30 mL at 07/08/15 0851  . antiseptic oral rinse (CPC / CETYLPYRIDINIUM CHLORIDE 0.05%) solution 7 mL  7 mL Mouth Rinse BID Maryann Mikhail, DO   7 mL at 07/08/15 1000  . aspirin chewable tablet 81 mg  81 mg Oral  Daily Theressa Millard, MD   81 mg at 07/08/15 7846  . enoxaparin (LOVENOX) injection 40 mg  40 mg Subcutaneous Q24H Hedy Jacob, PA-C   40 mg at 07/08/15 0630  . TPN (CLINIMIX-E) Adult   Intravenous Continuous TPN Berton Mount, RPH 40 mL/hr at 07/07/15 1810     And  . fat emulsion 20 % infusion 240 mL  240 mL Intravenous Continuous TPN Berton Mount, RPH 10 mL/hr at 07/07/15 1810 240 mL at 07/07/15 1810  . TPN (CLINIMIX-E) Adult   Intravenous Continuous TPN Minda Ditto, RPH       And  . fat emulsion 20 % infusion 240 mL  240 mL Intravenous Continuous TPN  Minda Ditto, RPH      . HYDROmorphone (DILAUDID) injection 0.5-1 mg  0.5-1 mg Intravenous Q3H PRN Theressa Millard, MD      . insulin aspart (novoLOG) injection 0-15 Units  0-15 Units Subcutaneous 4 times per day Verlee Monte, MD   2 Units at 07/08/15 1356  . levothyroxine (SYNTHROID, LEVOTHROID) injection 50 mcg  50 mcg Intravenous Daily [name], MD   50 mcg at 07/08/15 0958  . LORazepam (ATIVAN) injection 0.5 mg  0.5 mg Intravenous Q8H PRN [name], MD   0.5 mg at 07/07/15 2058  . ondansetron (ZOFRAN) tablet 4 mg  4 mg Oral Q6H PRN Theressa Millard, MD       Or  . ondansetron [facility]) injection 4 mg  4 mg Intravenous Q6H PRN Theressa Millard, MD   4 mg at 07/06/15 2101  . oxyCODONE (Oxy IR/ROXICODONE) immediate release tablet 2.5 mg  2.5 mg Oral Q4H PRN Verlee Monte, MD   2.5 mg at 07/08/15 1441  . piperacillin-tazobactam (ZOSYN) IVPB 3.375 g  3.375 g Intravenous Q8H Christine E Shade, RPH   3.375 g at 07/08/15 0841  . sodium chloride 0.9 % injection 10-40 mL  10-40 mL Intracatheter Q12H Verlee Monte, MD   10 mL at 07/07/15 2100  . sodium chloride 0.9 % injection 10-40 mL  10-40 mL Intracatheter PRN Verlee Monte, MD       Do not use this list as official medication orders. Please verify with discharge summary.  Discharge Medications:   Medication List    STOP taking these medications        estrogens (conjugated) 0.3 MG tablet  Commonly known as:  PREMARIN     feeding supplement Liqd     levofloxacin 500 MG tablet  Commonly known as:  LEVAQUIN     simvastatin 40 MG tablet  Commonly known as:  ZOCOR     spironolactone-hydrochlorothiazide 25-25 MG tablet  Commonly known as:  ALDACTAZIDE      TAKE these medications        acetaminophen 325 MG tablet  Commonly known as:  TYLENOL  Take 1-2 tablets (325-650 mg total) by mouth every 6 (six) hours as needed for fever, headache, mild pain or moderate pain.     alum & mag hydroxide-simeth 200-200-20 MG/5ML  suspension  Commonly known as:  MAALOX/MYLANTA  Take 30 mLs by mouth every 6 (six) hours as needed for indigestion or heartburn (or bloating).     aspirin 81 MG tablet  Take 81 mg by mouth daily.     HYDROcodone-acetaminophen 5-325 MG tablet  Commonly known as:  NORCO/VICODIN  Take 1 tablet by mouth every 6 (six) hours as needed for moderate pain.     ibuprofen 400 MG  tablet  Commonly known as:  ADVIL,MOTRIN  Take 1-2 tablets (400-800 mg total) by mouth every 12 (twelve) hours as needed for fever or headache.     levothyroxine 100 MCG tablet  Commonly known as:  SYNTHROID, LEVOTHROID  TAKE ONE TABLET BY MOUTH 30 MINUTES BEFORE BREAKFAST EVERY MORNING. SEPARATE FROM OTHER MEDICATIONS FOR THYROID.     LORazepam 0.5 MG tablet  Commonly known as:  ATIVAN  Take 1 tablet (0.5 mg total) by mouth every 6 (six) hours as needed for anxiety.     losartan 50 MG tablet  Commonly known as:  COZAAR  Take one tablet by mouth once daily for blood pressure     ondansetron 4 MG tablet  Commonly known as:  ZOFRAN  Take 1 tablet (4 mg total) by mouth every 8 (eight) hours as needed for nausea or vomiting.     OVER THE COUNTER MEDICATION  Take 1 Container by mouth 3 (three) times daily. Med pass     polyethylene glycol packet  Commonly known as:  MIRALAX / GLYCOLAX  Take 17 g by mouth daily.     PROBIOTIC DAILY PO  Take 1 tablet by mouth daily.     shark liver oil-cocoa butter 0.25-3-85.5 % suppository  Commonly known as:  PREPARATION H  Place 1 suppository rectally as needed for hemorrhoids.     simethicone 125 MG chewable tablet  Commonly known as:  MYLICON  Chew 704 mg by mouth 3 (three) times daily.     traMADol 50 MG tablet  Commonly known as:  ULTRAM  Take 1 tablet (50 mg total) by mouth every 6 (six) hours as needed for moderate pain or severe pain.        Relevant Imaging Results:  Relevant Lab Results:  Recent Labs    Additional Information SSN# [id number] Pt has  perc drain which was placed by IR 11/22 and pt will discharge with perc drain in place- [place] regimen: Normal Saline 5 mL three times daily.  KIDD, SUZANNA A, LCSW

## 2015-07-09 LAB — BASIC METABOLIC PANEL
ANION GAP: 5 (ref 5–15)
BUN: 9 mg/dL (ref 6–20)
CO2: 26 mmol/L (ref 22–32)
Calcium: 8.6 mg/dL — ABNORMAL LOW (ref 8.9–10.3)
Chloride: 101 mmol/L (ref 101–111)
Creatinine, Ser: 0.85 mg/dL (ref 0.44–1.00)
GFR calc Af Amer: >60 mL/min (ref >60)
GFR calc non Af Amer: 60 mL/min — ABNORMAL LOW (ref >60)
GLUCOSE: 130 mg/dL — AB (ref 65–99)
POTASSIUM: 3.6 mmol/L (ref 3.5–5.1)
Sodium: 132 mmol/L — ABNORMAL LOW (ref 135–145)

## 2015-07-09 LAB — CULTURE, ROUTINE-ABSCESS: CULTURE: NO GROWTH

## 2015-07-09 LAB — CBC
HEMATOCRIT: 30.7 % — AB (ref 36.0–46.0)
HEMOGLOBIN: 10.1 g/dL — AB (ref 12.0–15.0)
MCH: 27.7 pg (ref 26.0–34.0)
MCHC: 32.9 g/dL (ref 30.0–36.0)
MCV: 84.3 fL (ref 78.0–100.0)
Platelets: 215 10*3/uL (ref 150–400)
RBC: 3.64 MIL/uL — AB (ref 3.87–5.11)
RDW: 13.7 % (ref 11.5–15.5)
WBC: 11.1 10*3/uL — ABNORMAL HIGH (ref 4.0–10.5)

## 2015-07-09 LAB — GLUCOSE, CAPILLARY
GLUCOSE-CAPILLARY: 159 mg/dL — AB (ref 65–99)
Glucose-Capillary: 120 mg/dL — ABNORMAL HIGH (ref 65–99)
Glucose-Capillary: 139 mg/dL — ABNORMAL HIGH (ref 65–99)

## 2015-07-09 LAB — PHOSPHORUS: Phosphorus: 2.6 mg/dL (ref 2.5–4.6)

## 2015-07-09 MED ORDER — PANTOPRAZOLE SODIUM 40 MG PO TBEC
40.0000 mg | DELAYED_RELEASE_TABLET | Freq: Two times a day (BID) | ORAL | Status: DC
  Administered 2015-07-09 – 2015-07-11 (×4): 40 mg via ORAL
  Filled 2015-07-09 (×4): qty 1

## 2015-07-09 MED ORDER — LEVOTHYROXINE SODIUM 100 MCG PO TABS
100.0000 ug | ORAL_TABLET | Freq: Every day | ORAL | Status: DC
  Administered 2015-07-10 – 2015-07-11 (×2): 100 ug via ORAL
  Filled 2015-07-09 (×2): qty 1

## 2015-07-09 MED ORDER — AMOXICILLIN-POT CLAVULANATE 875-125 MG PO TABS
1.0000 | ORAL_TABLET | Freq: Two times a day (BID) | ORAL | Status: DC
  Administered 2015-07-09 – 2015-07-11 (×5): 1 via ORAL
  Filled 2015-07-09 (×5): qty 1

## 2015-07-09 NOTE — Progress Notes (Addendum)
CSW continuing to follow.   Pt admitted from [facility] and [facility], but facility is unable to accept pt back upon discharge.  CSW reinitiated SNF search with updated clinical information.  CSW left list of bed offers at pt bedside. Pt daughter, Bev not present at this time. Pt awake and doing crossword puzzle during visit. Pt in good spirits.  CSW contacted pt daughter, Bev via telephone and left voice message.  CSW to continue to follow to assist with pt disposition needs.  Addendum 12:31 pm:  CSW received phone call from pt daughter, Bev stating that she toured [place] and is agreeable to [facility] if [facility] is unable to accept pt upon discharge. CSW explained to pt daughter that per MD, hopeful to wean pt off of TNA, but if pt unable to wean off then the bed offers may change.   At this time, [facility] states they would be unable to accept pt, CSW will continue to follow up.  CSW notified Ritta Slot of pt daughter interest.  CSW to continue to follow to assist with pt disposition needs when medically ready for discharge.  Destiny Velasquez, MSW, [place] Work [phone]

## 2015-07-09 NOTE — Progress Notes (Signed)
PHARMACY NOTE - [facility] has been assisting with dosing of Zosyn for UTI/IAI. Dosage remains stable at Zosyn 3.375g IV Q8H infused over 4hrs and need for further dosage adjustment appears unlikely at present.    Will sign off at this time.  Please reconsult if a change in clinical status warrants re-evaluation of dosage.  Romeo Rabon, PharmD, pager [phone]. 07/09/2015,10:53 AM.

## 2015-07-09 NOTE — Progress Notes (Signed)
Patient ID: Destiny Velasquez, female   DOB: 1926/05/16, 81 y.o.   MRN: [id number]    Subjective: Tolerated diet better yesterday.  Feeling a lot better.  .   Objective: Vital signs in last 24 hours: Temp:  [98.1 F (36.7 C)-98.7 F (37.1 C)] 98.1 F (36.7 C) (11/26 0359) Pulse Rate:  [79-92] 92 (11/26 0359) Resp:  [18-20] 18 (11/26 0359) BP: (134-157)/(42-75) 153/71 mmHg (11/26 0359) SpO2:  [96 %-98 %] 97 % (11/26 0359) Last BM Date: 07/07/15  Intake/Output from previous day: 11/25 0701 - 11/26 0700 In: 740 [P.O.:740] Out: 200 [Urine:200] Intake/Output this shift:    PE: General- In NAD Abdomen-soft, non distended.  Non tender.  Drain bilious.    Lab Results:   Recent Labs  07/07/15 0530 07/09/15 0500  WBC 12.6* 11.1*  HGB 9.5* 10.1*  HCT 28.7* 30.7*  PLT 219 215   BMET  Recent Labs  07/08/15 0425 07/09/15 0500  NA 136 132*  K 3.7 3.6  CL 105 101  CO2 25 26  GLUCOSE 128* 130*  BUN 9 9  CREATININE 0.88 0.85  CALCIUM 8.7* 8.6*   PT/INR No results for input(s): LABPROT, INR in the last 72 hours. Comprehensive Metabolic Panel:    Component Value Date/Time   NA 132* 07/09/2015 0500   NA 136 07/08/2015 0425   NA 139 03/30/2015 0813   NA 139 09/20/2014 0816   K 3.6 07/09/2015 0500   K 3.7 07/08/2015 0425   CL 101 07/09/2015 0500   CL 105 07/08/2015 0425   CO2 26 07/09/2015 0500   CO2 25 07/08/2015 0425   BUN 9 07/09/2015 0500   BUN 9 07/08/2015 0425   BUN 18 03/30/2015 0813   BUN 18 09/20/2014 0816   CREATININE 0.85 07/09/2015 0500   CREATININE 0.88 07/08/2015 0425   GLUCOSE 130* 07/09/2015 0500   GLUCOSE 128* 07/08/2015 0425   GLUCOSE 119* 03/30/2015 0813   GLUCOSE 132* 09/20/2014 0816   CALCIUM 8.6* 07/09/2015 0500   CALCIUM 8.7* 07/08/2015 0425   AST 14* 07/07/2015 0530   AST 24 07/02/2015 0026   ALT 13* 07/07/2015 0530   ALT 17 07/02/2015 0026   ALKPHOS 66 07/07/2015 0530   ALKPHOS 84 07/02/2015 0026   BILITOT 0.5 07/07/2015 0530   BILITOT  0.6 07/02/2015 0026   BILITOT 0.3 09/20/2014 0816   PROT 5.1* 07/07/2015 0530   PROT 6.9 07/02/2015 0026   PROT 6.6 09/20/2014 0816   PROT 6.8 03/17/2014 0807   ALBUMIN 2.0* 07/07/2015 0530   ALBUMIN 3.0* 07/02/2015 0026   ALBUMIN 3.9 09/20/2014 0816   ALBUMIN 3.9 03/17/2014 0807     Studies/Results: No results found.  Anti-infectives: Anti-infectives    Start     Dose/Rate Route Frequency Ordered Stop   07/05/15 0815  piperacillin-tazobactam (ZOSYN) IVPB 3.375 g     3.375 g 12.5 mL/hr over 240 Minutes Intravenous Every 8 hours 07/05/15 0813     07/04/15 2000  cefTRIAXone (ROCEPHIN) 1 g in dextrose 5 % 50 mL IVPB  Status:  Discontinued     1 g 100 mL/hr over 30 Minutes Intravenous Every 24 hours 07/04/15 1255 07/05/15 0805   07/02/15 1200  aztreonam (AZACTAM) 500 mg in dextrose 5 % 50 mL IVPB  Status:  Discontinued     500 mg 100 mL/hr over 30 Minutes Intravenous Every 8 hours 07/02/15 0545 07/04/15 1255   07/02/15 0400  aztreonam (AZACTAM) 1 g in dextrose 5 % 50 mL IVPB  1 g 100 mL/hr over 30 Minutes Intravenous  Once 07/02/15 0346 07/02/15 0418      Assessment Principal Problem:   Pyelonephritis   Postop ileus   Postop biloma   Failure to thrive   Moderate PC malnutrition   Adenocarcinoma of liver s/p partial right hepatic lobectomy 06/14/2015     LOS: 7 days   Plan:  Diet as tolerated.  Wean TNA Consider transition to oral antibiotics.    [facility] 07/09/2015

## 2015-07-09 NOTE — Progress Notes (Signed)
PARENTERAL NUTRITION CONSULT NOTE   Pharmacy Consult for TPN Indication: intolerance to enteral feeding  No Known Allergies  Patient Measurements: Height: '5\' 3"'$  (160 cm) Weight: 138 lb 9.6 oz (62.869 kg) IBW/kg (Calculated) : 52.4   Vital Signs: Temp: 98.1 F (36.7 C) (11/26 0359) Temp Source: Oral (11/26 0359) BP: 153/71 mmHg (11/26 0359) Pulse Rate: 92 (11/26 0359) Intake/Output from previous day: 11/25 0701 - 11/26 0700 In: 740 [P.O.:740] Out: 200 [Urine:200] Intake/Output from this shift:    Labs:  Recent Labs  07/07/15 0530 07/09/15 0500  WBC 12.6* 11.1*  HGB 9.5* 10.1*  HCT 28.7* 30.7*  PLT 219 215     Recent Labs  07/07/15 0530 07/07/15 0545 07/08/15 0425 07/09/15 0500  NA 138  --  136 132*  K 3.3*  --  3.7 3.6  CL 108  --  105 101  CO2 26  --  25 26  GLUCOSE 136*  --  128* 130*  BUN 8  --  9 9  CREATININE 0.74  --  0.88 0.85  CALCIUM 8.6*  --  8.7* 8.6*  MG 1.5*  --  1.9  --   PHOS 1.8*  --  2.4* 2.6  PROT 5.1*  --   --   --   ALBUMIN 2.0*  --   --   --   AST 14*  --   --   --   ALT 13*  --   --   --   ALKPHOS 66  --   --   --   BILITOT 0.5  --   --   --   PREALBUMIN 5.5*  --   --   --   TRIG  --  83  --   --    Estimated Creatinine Clearance: 41.7 mL/min (by C-G formula based on Cr of 0.85).    Recent Labs  07/08/15 1824 07/09/15 0002 07/09/15 0628  GLUCAP 123* 139* 120*    Insulin Requirements: 6 units last 24 hr.   Current Nutrition: Soft diet, consuming 40-50% per MAR.  IVF: NS@ 35 mls/hr  Central access: PICC 11/23 TPN start date:  11/23  ASSESSMENT                                                                                                          HPI: 81 y.o. female s/p R hepatic lobectomy 06/14/15 d/t adenocarcinoma. Admitted on 11/18 for UTI, IR placed drain on 11/22. Pt with persistent nausea.  Vomiting solid and liquid intake on admit, 6% weight loss since surgery.  Pharmacy to start TPN.   Significant  events:   Diet advanced to Soft 11/25  Today:   Glucose - CBG's < 150, no hx of diabetes  Electrolytes - WNL, including CorrCa 10.2.   Renal - SCr WNL  LFTs - AST/ALT low  TGs - 83 (11/24)  Prealbumin - 5.5 (11/24)  NUTRITIONAL GOALS  RD recs: Kcal 1600-1800, Protein 75-85 gm/day Clinimix E 5/15 at a goal rate of 84m/hr + 20% fat emulsion at 120mhr to provide: 96g/day protein, 1843Kcal/day.  PLAN                                                                                           At 1800 today:  DC Clinimix.  DC 20% fat emulsion.  Continue NS at 3548mr.  DC SSI and CBGs.  DC TPN lab panels on Mondays & Thursdays.  TomRomeo RabonharmD, pager [phone]/26/2016,10:45 AM.

## 2015-07-09 NOTE — Progress Notes (Signed)
[facility]                                                                              Patient Demographics  Destiny Velasquez, is a 81 y.o. female, DOB - 11-06-1925, JTT:[id number]  Admit date - 07/01/2015   Admitting Physician Theressa Millard, MD  Outpatient Primary MD for the patient is [name], [name], DO  LOS - 7  Subjective:   Seen with her daughter at bedside, start on soft diet yesterday. Patient is tolerating that very well. We'll switch antibiotics to oral, ambulate as tolerated.  Chief Complaint  Patient presents with  . Elevated WBC       HPI on 07/02/2015 by Dr. Jana Hakim Vermell Velasquez is a 81 y.o. female with history of Liver Adenocarcinoma S/P resection on 06/15/2015 currently in the [facility] for Rehab Rx who was sent to the ED after laboratory results revealed a persistent Elevated WBC despite 10 days of antibiotic rx for a UTI with Levaquin. She was evaluated in the ED and also found to have an elevated BUN/Cr of 41/1.82. A repeat Urine Culture was sent and she was placed on IV Azactam and referred for admission.  One of her daughters is at bedside and reports that she has observed her to have dysphagia x 2 -3 days, and difficulty swallowing her medications  Interim history Patient being treated for UTI, which was resistant to cipro (levaquin given as an outpatient).  CT abd showed 9cm fluid collection, ?abscess.  Percutaneous drain placed on 07/05/15  Assessment & Plan   Urinary tract infection -UA on 06/22/2015 was positive for infection however no urine culture done at that time.  -UA 07/02/2015 shows WBC TNTC, large leukocytes, many bacteria -Urine culture: Ecoli mainly pan sensitive (resitant to ampicillin and cipro) -Blood cultures show no growth -WBC trending upward today, 14.   -On IV Zosyn, will switch to oral antibiotics  Right hepatic lobe bed 9 cm fluid collection -Seen on CT scan -?Abscess vs postop fluid collections, however  culture is NGTD. -Gen surgery consulted and appreciated, percutaneous drain placed by IR. -Has some troubles tolerating diet postoperatively, able to advance to soft diet.  Acute on chronic kidney injury, stage III -Likely secondary to dehydration -Creatinine on admission 1.82, currently 0.74 -Continue to monitor BMP  Dysphagia -Speech therapy consultation appreciated, recommended regular diet, medication: crushed with puree  Hyponatremia -Resolved, Sodium currently 135, continue monitor BMP -TSH 03/30/2015 1.6 -Continue IVF  Hypokalemia -Was not able to take oral supplements yesterday, continued to have hypokalemia.  Hypothyroidism -Continue Synthroid -TSH 03/30/2015 1.6  Essential hypertension -Blood pressure currently stable -Losartan and spironolatone/HCTZ held  Hyperlipidemia -Patient no longer taking statin, should follow up with her primary care physician   Adenocarcinoma liver -Status post hepatic lobectomy 06/14/2015 -Stable  -General surgery consulted and appreciated  Physical deconditioning -PT rec SNF  Code Status: Full  Family Communication: Daughters at bedside  Disposition Plan: Discharged likely on Monday morning.  Time Spent in minutes 30 minutes  Procedures  None  Consults  General surgery Interventional radiology   DVT Prophylaxis Lovenox  Lab Results  Component Value Date   PLT 215 07/09/2015  Medications  Scheduled Meds: . antiseptic oral rinse  7 mL Mouth Rinse BID  . aspirin  81 mg Oral Daily  . enoxaparin (LOVENOX) injection  40 mg Subcutaneous Q24H  . [START ON 07/10/2015] levothyroxine  100 mcg Oral QAC breakfast  . piperacillin-tazobactam (ZOSYN)  IV  3.375 g Intravenous Q8H  . sodium chloride  10-40 mL Intracatheter Q12H   Continuous Infusions: . sodium chloride 35 mL/hr at 07/09/15 0030  . Marland KitchenTPN (CLINIMIX-E) Adult 40 mL/hr at 07/08/15 1701   And  . fat emulsion 240 mL (07/08/15 1701)   PRN  Meds:.acetaminophen **OR** acetaminophen, alum & mag hydroxide-simeth, HYDROmorphone (DILAUDID) injection, LORazepam, ondansetron **OR** ondansetron (ZOFRAN) IV, oxyCODONE, sodium chloride  Antibiotics    Anti-infectives    Start     Dose/Rate Route Frequency Ordered Stop   07/05/15 0815  piperacillin-tazobactam (ZOSYN) IVPB 3.375 g     3.375 g 12.5 mL/hr over 240 Minutes Intravenous Every 8 hours 07/05/15 0813     07/04/15 2000  cefTRIAXone (ROCEPHIN) 1 g in dextrose 5 % 50 mL IVPB  Status:  Discontinued     1 g 100 mL/hr over 30 Minutes Intravenous Every 24 hours 07/04/15 1255 07/05/15 0805   07/02/15 1200  aztreonam (AZACTAM) 500 mg in dextrose 5 % 50 mL IVPB  Status:  Discontinued     500 mg 100 mL/hr over 30 Minutes Intravenous Every 8 hours 07/02/15 0545 07/04/15 1255   07/02/15 0400  aztreonam (AZACTAM) 1 g in dextrose 5 % 50 mL IVPB     1 g 100 mL/hr over 30 Minutes Intravenous  Once 07/02/15 0346 07/02/15 0418       Objective:   Filed Vitals:   07/08/15 1320 07/08/15 1951 07/09/15 0035 07/09/15 0359  BP: 134/42 157/75 142/60 153/71  Pulse: 86  79 92  Temp: 98.4 F (36.9 C) 98.7 F (37.1 C) 98.3 F (36.8 C) 98.1 F (36.7 C)  TempSrc: Oral Oral Oral Oral  Resp: '18 20 18 18  '$ Height:      Weight:      SpO2: 97% 98% 96% 97%    Wt Readings from Last 3 Encounters:  07/02/15 62.869 kg (138 lb 9.6 oz)  06/14/15 65.318 kg (144 lb)  06/22/15 65.318 kg (144 lb)     Intake/Output Summary (Last 24 hours) at 07/09/15 1227 Last data filed at 07/08/15 2343  Gross per 24 hour  Intake    440 ml  Output    200 ml  Net    240 ml    Exam  General: Well developed, well nourished, NAD  HEENT: NCAT,  mucous membranes moist.   Cardiovascular: S1 S2 auscultated, RRR, no murmurs  Respiratory: Clear to auscultation   Abdomen: Soft, nontender, nondistended, + bowel sounds  Extremities: warm dry without cyanosis clubbing or edema  Neuro: AAOx3, nonfocal  Psych: Normal  affect and demeanor, somewhat irritable today  Data Review   Micro Results Recent Results (from the past 240 hour(s))  Blood Culture (routine x 2)     Status: None   Collection Time: 07/02/15 12:20 AM  Result Value Ref Range Status   Specimen Description BLOOD LEFT ANTECUBITAL  Final   Special Requests BOTTLES DRAWN AEROBIC AND ANAEROBIC 5ML  Final   Culture   Final    NO GROWTH 5 DAYS Performed at [facility]    Report Status 07/07/2015 FINAL  Final  Blood Culture (routine x 2)     Status: None   Collection  Time: 07/02/15 12:27 AM  Result Value Ref Range Status   Specimen Description BLOOD LEFT HAND  Final   Special Requests IN PEDIATRIC BOTTLE 5CC  Final   Culture   Final    NO GROWTH 5 DAYS Performed at [facility]    Report Status 07/07/2015 FINAL  Final  Urine culture     Status: None   Collection Time: 07/02/15  1:41 AM  Result Value Ref Range Status   Specimen Description URINE, CLEAN CATCH  Final   Special Requests NONE  Final   Culture   Final    >=100,000 COLONIES/mL ESCHERICHIA COLI Performed at [facility]    Report Status 07/04/2015 FINAL  Final   Organism ID, Bacteria ESCHERICHIA COLI  Final      Susceptibility   Escherichia coli - MIC*    AMPICILLIN >=32 RESISTANT Resistant     CEFAZOLIN <=4 SENSITIVE Sensitive     CEFTRIAXONE <=1 SENSITIVE Sensitive     CIPROFLOXACIN >=4 RESISTANT Resistant     GENTAMICIN <=1 SENSITIVE Sensitive     IMIPENEM 0.5 SENSITIVE Sensitive     NITROFURANTOIN <=16 SENSITIVE Sensitive     TRIMETH/SULFA <=20 SENSITIVE Sensitive     AMPICILLIN/SULBACTAM 16 INTERMEDIATE Intermediate     PIP/TAZO 8 SENSITIVE Sensitive     * >=100,000 COLONIES/mL ESCHERICHIA COLI  Culture, routine-abscess     Status: None   Collection Time: 07/05/15  3:14 PM  Result Value Ref Range Status   Specimen Description ABSCESS ABDOMEN RIGHT  Final   Special Requests NONE  Final   Gram Stain   Final    RARE WBC PRESENT,  PREDOMINANTLY PMN NO SQUAMOUS EPITHELIAL CELLS SEEN NO ORGANISMS SEEN Performed at [facility]    Culture   Final    NO GROWTH 3 DAYS Performed at [facility]    Report Status 07/09/2015 FINAL  Final    Radiology Reports Dg Chest 2 View  07/02/2015  CLINICAL DATA:  Right upper quadrant pain. Cough with swallowing. Leukocytosis. EXAM: CHEST  2 VIEW COMPARISON:  PET-CT 05/17/2015 FINDINGS: Heart at the upper limits of normal in size, atherosclerosis of the thoracic aorta. Linear opacity in the right perihilar lung consistent with atelectasis. No consolidation to suggest pneumonia. Pulmonary vasculature is normal. No consolidation, pleural effusion, or pneumothorax. No acute osseous abnormalities are seen. IMPRESSION: Linear right perihilar atelectasis.  No pneumonia. Electronically Signed   By: Jeb Levering M.D.   On: 07/02/2015 00:47   Ct Head Wo Contrast  06/16/2015  CLINICAL DATA:  Mild facial droop, no other neurological deficits EXAM: CT HEAD WITHOUT CONTRAST TECHNIQUE: Contiguous axial images were obtained from the base of the skull through the vertex without intravenous contrast. COMPARISON:  None. FINDINGS: Diffuse age-related cortical atrophy as well as diffuse low attenuation in the deep white matter. There is no evidence of vascular territory cortical infarct. There is no evidence of mass. There is no hemorrhage or extra-axial fluid. There is no hydrocephalus. Sub cm focus of low attenuation left pons. Sub cm calcified meningioma right frontal region. Calvarium intact. No significant inflammatory change in the visualized portions of the paranasal sinuses. IMPRESSION: Chronic involutional change with no hemorrhage or vascular territory infarct. Tiny focus of low attenuation left pons measuring a few mm may reflect another focus of chronic white matter degeneration versus tiny lacunar infarct of uncertain age. This could be better evaluated with MRI if indicated.  Electronically Signed   By: Elodia Florence.D.  On: 06/16/2015 08:38   Mr Jeri Cos YI Contrast  06/22/2015  CLINICAL DATA:  Abnormal CT scan of the head. EXAM: MRI HEAD WITHOUT AND WITH CONTRAST TECHNIQUE: Multiplanar, multiecho pulse sequences of the brain and surrounding structures were obtained without and with intravenous contrast. CONTRAST:  58m MULTIHANCE GADOBENATE DIMEGLUMINE 529 MG/ML IV SOLN COMPARISON:  CT head without contrast FINDINGS: Moderate generalized atrophy and white matter changes are present bilaterally. No acute infarct, hemorrhage, or mass lesion is present. The ventricles are of normal size. The ventricles are proportionate to the degree of atrophy. No significant extra-axial fluid collection is present. Is brainstem and cerebellum are within normal limits. Flow is present in the major intracranial arteries. Bilateral lens replacements are present. The globes and orbits are otherwise intact. A single posterior right ethmoid air cell is opacified. The remaining paranasal sinuses and mastoid air cells are clear. Postcontrast images demonstrate no pathologic enhancement. IMPRESSION: 1. Moderate atrophy and white matter disease. This likely reflects the sequela of chronic microvascular ischemia. 2. No acute infarct. Electronically Signed   By: CSan MorelleM.D.   On: 06/22/2015 19:50   UKoreaAbscess Drain  07/05/2015  CLINICAL DATA:  81year old female with a history of poorly differentiated carcinoma of the right liver lobe. Status post resection, has developed what appears to be abscess of the surgical site within the right liver lobe. Differential diagnosis includes biloma and abscess. EXAM: ULTRASOUND GUIDED DRAIN PLACEMENT OF RIGHT LIVER LOBE MEDICATIONS: No sedation. PROCEDURE: The procedure, risks, benefits, and alternatives were explained to the patient. Questions regarding the procedure were encouraged and answered. The patient understands and consents to the procedure.  Ultrasound survey of the right upper quadrant was performed. Images were stored and sent to PACs. The right upper quadrant anterior wall was prepped with Betadine in a sterile fashion, and a sterile drape was applied covering the operative field. A sterile gown and sterile gloves were used for the procedure. Local anesthesia was provided with 1% Lidocaine. Once the patient is prepped and draped in the usual sterile fashion, the skin and subcutaneous tissues were generously infiltrated with 1% lidocaine for local anesthesia. A small stab incision was made with 11 blade scalpel. Trocar technique was used to advance a 10 FPakistandrain in a transhepatic trajectory into the fluid collection of the rib surgical site and right upper abdomen. Bilious drainage was returned. Sample was sent to the lab. Pigtail catheter was formed and a final image was stored. Patient tolerated the procedure well and remained hemodynamically stable throughout. No complications encountered and no significant blood loss encountered. Drain was sutured into position and left to gravity drainage. COMPLICATIONS: None. FINDINGS: Ultrasound survey demonstrates complex fluid collection within the right upper quadrant with hyperechoic central aspect, which may represent fat necrosis or omental infarct. Bilious fluid was easily aspirated once the drain was in place. IMPRESSION: Status post ultrasound-guided drainage of biloma of the surgical site. Ten FPakistandrain placed. Signed, JDulcy Fanny WEarleen Newport DO [facility] Electronically Signed   By: JCorrie MckusickD.O.   On: 07/05/2015 17:59   Ct Abdomen Pelvis W Contrast  07/04/2015  CLINICAL DATA:  Three weeks status post resection of hepatic adenocarcinoma. Nausea. Persistent leukocytosis. EXAM: CT ABDOMEN AND PELVIS WITH CONTRAST TECHNIQUE: Multidetector CT imaging of the abdomen and pelvis was performed using the standard protocol following bolus  administration of intravenous contrast. CONTRAST:  1031mOMNIPAQUE IOHEXOL 300 MG/ML  SOLN COMPARISON:  05/03/2015 FINDINGS: Lower chest:  Small bilateral pleural effusions noted as well as tiny pericardial effusion. Mild wall thickening of distal thoracic esophagus is suspicious for esophagitis. Hepatobiliary: There has been interval resection of previously seen right hepatic lobe mass. There is an irregular rim enhancing fluid collection within the surgical bed in the right hepatic lobe which measures 8.1 x 8.8 cm on image 32/series 3. This could be due to postop fluid collection or abscess. No other liver lesions are identified. A small rim enhancing perihepatic fluid collection is also seen adjacent to the anterior right hepatic lobe which measures 1.8 x 7.8 cm on image 26/ series 3. This could also represent postop fluid collection or abscess. Prior cholecystectomy again noted. Dilatation of extrahepatic common bile duct remains stable. Pancreas: No mass, inflammatory changes, or other significant abnormality. Spleen: Within normal limits in size and appearance. Adrenals/Urinary Tract: No masses identified. No evidence of hydronephrosis. Mild diffuse bladder wall thickening noted as well as mild dilatation, possibly due to neurogenic bladder. Stomach/Bowel: Diverticulitis near the junction of the descending and sigmoid colon shows improvement since previous study. Vascular/Lymphatic: No pathologically enlarged lymph nodes. No evidence of abdominal aortic aneurysm. Reproductive: Previous hysterectomy a 3.8 cm cyst with a tiny posterior mural calcification is seen in the left adnexa on image 96 98/series 3 which is not significant changed since previous study. No definite malignant features identified. This could represent a diverticular abscess or cystic lesion of left ovary. Other: Small amount of free fluid in pelvic cul-de-sac. Musculoskeletal: No suspicious bone lesions identified. Spondylosis grade 2  anterolisthesis noted at L4-5. IMPRESSION: 9 cm rim enhancing fluid collection in right hepatic lobe surgical bed, and smaller perihepatic fluid collection adjacent to right lobe. Differential diagnosis includes postop fluid collections and abscess. Improving diverticulitis. Persistent 3.8 cm left lower quadrant fluid collection, which could represent a diverticular abscess or left ovarian cystic lesion. No malignant features identified. Recommend continued attention on follow-up imaging. Small bilateral pleural effusions, tiny pericardial effusion, and small amount free fluid in pelvic cul-de-sac. Distal esophageal wall thickening, suspicious for esophagitis. Mildly distended urinary bladder with diffuse wall thickening, suspicious for neurogenic bladder. Electronically Signed   By: Earle Gell M.D.   On: 07/04/2015 15:15    CBC  Recent Labs Lab 07/04/15 0444 07/05/15 0415 07/06/15 0428 07/07/15 0530 07/09/15 0500  WBC 9.3 14.5* 13.3* 12.6* 11.1*  HGB 10.6* 11.0* 10.7* 9.5* 10.1*  HCT 31.8* 33.5* 32.6* 28.7* 30.7*  PLT 243 302 269 219 215  MCV 85.9 85.9 86.0 84.2 84.3  MCH 28.6 28.2 28.2 27.9 27.7  MCHC 33.3 32.8 32.8 33.1 32.9  RDW 13.7 13.8 13.8 13.6 13.7  LYMPHSABS  --   --   --  1.6  --   MONOABS  --   --   --  1.1*  --   EOSABS  --   --   --  0.8*  --   BASOSABS  --   --   --  0.1  --     Chemistries   Recent Labs Lab 07/05/15 0415 07/06/15 0428 07/07/15 0530 07/08/15 0425 07/09/15 0500  NA 135 139 138 136 132*  K 3.5 3.2* 3.3* 3.7 3.6  CL 106 110 108 105 101  CO2 '23 24 26 25 26  '$ GLUCOSE 112* 109* 136* 128* 130*  BUN '9 8 8 9 9  '$ CREATININE 0.74 0.87 0.74 0.88 0.85  CALCIUM 9.0 8.7* 8.6* 8.7* 8.6*  MG  --   --  1.5* 1.9  --  AST  --   --  14*  --   --   ALT  --   --  13*  --   --   ALKPHOS  --   --  66  --   --   BILITOT  --   --  0.5  --   --     ------------------------------------------------------------------------------------------------------------------ estimated creatinine clearance is 41.7 mL/min (by C-G formula based on Cr of 0.85). ------------------------------------------------------------------------------------------------------------------ No results for input(s): HGBA1C in the last 72 hours. ------------------------------------------------------------------------------------------------------------------  Recent Labs  07/07/15 0545  TRIG 83   ------------------------------------------------------------------------------------------------------------------ No results for input(s): TSH, T4TOTAL, T3FREE, THYROIDAB in the last 72 hours.  Invalid input(s): FREET3 ------------------------------------------------------------------------------------------------------------------ No results for input(s): VITAMINB12, FOLATE, FERRITIN, TIBC, IRON, RETICCTPCT in the last 72 hours.  Coagulation profile  Recent Labs Lab 07/05/15 0415  INR 1.13    No results for input(s): DDIMER in the last 72 hours.  Cardiac Enzymes No results for input(s): CKMB, TROPONINI, MYOGLOBIN in the last 168 hours.  Invalid input(s): CK ------------------------------------------------------------------------------------------------------------------ Invalid input(s): POCBNP    Tayanna Talford A MD. on 07/09/2015 at 12:27 PM  Between 7am to 7pm - Pager - [phone]  After 7pm go to [email or web address] - password [email or web address]  And look for the night coverage person covering for me after hours  [facility] Office  [phone]

## 2015-07-10 NOTE — Discharge Instructions (Signed)
Acute Kidney Injury Acute kidney injury is any condition in which there is sudden (acute) damage to the kidneys. Acute kidney injury was previously known as acute kidney failure or acute renal failure. The kidneys are two organs that lie on either side of the spine between the middle of the back and the front of the abdomen. The kidneys:  Remove wastes and extra water from the blood.   Produce important hormones. These help keep bones strong, regulate blood pressure, and help create red blood cells.   Balance the fluids and chemicals in the blood and tissues. A small amount of kidney damage may not cause problems, but a large amount of damage may make it difficult or impossible for the kidneys to work the way they should. Acute kidney injury may develop into long-lasting (chronic) kidney disease. It may also develop into a life-threatening disease called end-stage kidney disease. Acute kidney injury can get worse very quickly, so it should be treated right away. Early treatment may prevent other kidney diseases from developing. CAUSES  1. A problem with blood flow to the kidneys. This may be caused by:  1. Blood loss.  2. Heart disease.  3. Severe burns.  4. Liver disease. 2. Direct damage to the kidneys. This may be caused by: 1. Some medicines.  2. A kidney infection.  3. Poisoning or consuming toxic substances.  4. A surgical wound.  5. A blow to the kidney area.  3. A problem with urine flow. This may be caused by:  1. Cancer.  2. Kidney stones.  3. An enlarged prostate. SIGNS AND SYMPTOMS  1. Swelling (edema) of the legs, ankles, or feet.  2. Tiredness (lethargy).  3. Nausea or vomiting.  4. Confusion.  5. Problems with urination, such as:  1. Painful or burning feeling during urination.  2. Decreased urine production.  3. Frequent accidents in children who are potty trained.  4. Bloody urine.  6. Muscle twitches and cramps.  7. Shortness of breath.   8. Seizures.  9. Chest pain or pressure. Sometimes, no symptoms are present. DIAGNOSIS Acute kidney injury may be detected and diagnosed by tests, including blood, urine, imaging, or kidney biopsy tests.  TREATMENT Treatment of acute kidney injury varies depending on the cause and severity of the kidney damage. In mild cases, no treatment may be needed. The kidneys may heal on their own. If acute kidney injury is more severe, your health care provider will treat the cause of the kidney damage, help the kidneys heal, and prevent complications from occurring. Severe cases may require a procedure to remove toxic wastes from the body (dialysis) or surgery to repair kidney damage. Surgery may involve:  1. Repair of a torn kidney.  2. Removal of an obstruction. HOME CARE INSTRUCTIONS 1. Follow your prescribed diet. 2. Take medicines only as directed by your health care provider. 3. Do not take any new medicines (prescription, over-the-counter, or nutritional supplements) unless approved by your health care provider. Many medicines can worsen your kidney damage or may need to have the dose adjusted.  4. Keep all follow-up visits as directed by your health care provider. This is important. 5. Observe your condition to make sure you are healing as expected. SEEK IMMEDIATE MEDICAL CARE IF:  You are feeling ill or have severe pain in the back or side.   Your symptoms return or you have new symptoms.  You have any symptoms of end-stage kidney disease. These include:   Persistent itchiness.  Loss of appetite.   Headaches.   Abnormally dark or light skin.  Numbness in the hands or feet.   Easy bruising.   Frequent hiccups.   Menstruation stops.   You have a fever.  You have increased urine production.  You have pain or bleeding when urinating. MAKE SURE YOU:   Understand these instructions.  Will watch your condition.  Will get help right away if you are not  doing well or get worse.   This information is not intended to replace advice given to you by your health care provider. Make sure you discuss any questions you have with your health care provider.   Document Released: 02/12/2011 Document Revised: 08/20/2014 Document Reviewed: 03/28/2012 Elsevier Interactive Patient Education 2016 [employer] Urinary Tract Infection Urinary tract infections (UTIs) can develop anywhere along your urinary tract. Your urinary tract is your body's drainage system for removing wastes and extra water. Your urinary tract includes two kidneys, two ureters, a bladder, and a urethra. Your kidneys are a pair of bean-shaped organs. Each kidney is about the size of your fist. They are located below your ribs, one on each side of your spine. CAUSES Infections are caused by microbes, which are microscopic organisms, including fungi, viruses, and bacteria. These organisms are so small that they can only be seen through a microscope. Bacteria are the microbes that most commonly cause UTIs. SYMPTOMS  Symptoms of UTIs may vary by age and gender of the patient and by the location of the infection. Symptoms in young women typically include a frequent and intense urge to urinate and a painful, burning feeling in the bladder or urethra during urination. Older women and men are more likely to be tired, shaky, and weak and have muscle aches and abdominal pain. A fever may mean the infection is in your kidneys. Other symptoms of a kidney infection include pain in your back or sides below the ribs, nausea, and vomiting. DIAGNOSIS To diagnose a UTI, your caregiver will ask you about your symptoms. Your caregiver will also ask you to provide a urine sample. The urine sample will be tested for bacteria and white blood cells. White blood cells are made by your body to help fight infection. TREATMENT  Typically, UTIs can be treated with medication. Because most UTIs are caused by a bacterial  infection, they usually can be treated with the use of antibiotics. The choice of antibiotic and length of treatment depend on your symptoms and the type of bacteria causing your infection. HOME CARE INSTRUCTIONS  If you were prescribed antibiotics, take them exactly as your caregiver instructs you. Finish the medication even if you feel better after you have only taken some of the medication.  Drink enough water and fluids to keep your urine clear or pale yellow.  Avoid caffeine, tea, and carbonated beverages. They tend to irritate your bladder.  Empty your bladder often. Avoid holding urine for long periods of time.  Empty your bladder before and after sexual intercourse.  After a bowel movement, women should cleanse from front to back. Use each tissue only once. SEEK MEDICAL CARE IF:  4. You have back pain. 5. You develop a fever. 6. Your symptoms do not begin to resolve within 3 days. SEEK IMMEDIATE MEDICAL CARE IF:  10. You have severe back pain or lower abdominal pain. 11. You develop chills. 12. You have nausea or vomiting. 13. You have continued burning or discomfort with urination. MAKE SURE YOU:  3. Understand these instructions.  4. Will watch your condition. 5. Will get help right away if you are not doing well or get worse.   This information is not intended to replace advice given to you by your health care provider. Make sure you discuss any questions you have with your health care provider.   Document Released: 05/09/2005 Document Revised: 04/20/2015 Document Reviewed: 09/07/2011 Elsevier Interactive Patient Education 2016 [employer].  Biliary Drainage Catheter Home Guide A biliary drainage catheter is a tube that is inserted through your skin into the bile ducts in your liver. The purpose of a biliary drainage catheter is to prevent backup of bile into the liver. Bile is a thick yellow or green fluid that helps digest fat in foods. Backup of bile can occur when  there is a blockage preventing bile from moving from the bile ducts into your small intestine, as it normally should. This can occur from a tumor, gallstones, or scar tissue. There are three types of biliary drainage:  External biliary drainage--With this type, bile is only drained into a collection bag outside your body (external collection bag).  Internal-external biliary drainage--Bile is drained to an external collection bag as well as into your small intestine.  Internal biliary drainage--Bile is only drained into your small intestine. HOW DO I CHANGE MY DRESSING? The dressing over the drain should be changed at least every other day or more frequently if needed to keep the dressing dry.  7. Wash your hands with soap and water. 8. Gently remove the old dressing. Avoid using scissors to remove the dressing because this may lead to accidental damage to the drain. 9. Once the dressing is removed, inspect the skin around the drain for redness, swelling, and foul smelling yellow or green discharge. 10. If the drain was sutured to the skin, inspect the suture to verify that it is still anchored in the skin. 11. Clean the skin around the insertion site with mild soap and warm water. Pat the area dry with a clean cloth. 12. Do not apply creams, ointments, or alcohol to the site. Allow the skin to air dry completely before applying a new dressing. 13. Use a drain sponge (4x4 split gauze) and place the drain through the slit. Cover the drain and the first gauze with a 4x4 gauze. The drain should rest on the gauze and not on the skin. 14. Tape the dressing to the skin. 15. Wash your hands with soap and water. HOW DO I FLUSH A DRAIN NOT ATTACHED TO A BAG? Biliary drains should be flushed daily unless you are instructed otherwise by a health care provider. The end of the drain is closed using an IV cap to which a syringe can be directly connected.  14. Clean the IV cap with an alcohol swab and then screw  the tip of a 10 ml normal saline syringe onto the IV cap. 15. Inject the saline over 5-10 seconds. If you feel resistance while injecting, stop immediately. 16. Remove the syringe from the cap. HOW DO I ATTACH A BAG TO MY DRAIN? If you are having trouble with your biliary drain, you may be directed by your health care provider to use bag drainage until you can be seen to fix the problem. You should always have a drainage bag and connecting tubing at home for this reason. If you do not, remember to ask for these at your next appointment.  6. Remove the bag and connecting tubing from their packaging. 7. Connect the funnel end  of the tubing to the bag's cone-shaped stem. 8. Remove the IV cap from the biliary drain by unscrewing it and replace it with the screw-on end of the tubing. 9. Save the IV cap in a sealable plastic storage bag. HOW DO I EMPTY MY DRAINAGE BAG? The drainage bag should be emptied when it becomes 2/3 full or before you go to sleep. Most drainage bags have a drainage valve at the bottom that allows them to be emptied easily. 6. Hold the bag over the toilet or basin (or measuring container, if you are directed to measure the drainage). 7. Unscrew the valve to open it, and allow the bag to drain. 8. Close the valve securely to avoid leakage, and wipe it clean with a tissue or disposable napkin. SEEK MEDICAL CARE IF:  Your pain gets worse after an initial improvement.  You have any questions about your tube.  Your redness, soreness, or swelling at the tube insertion site gets worse despite good cleaning.  Your skin breaks down around the tube.  You have leakage of bile around the tube.  Your tube becomes blocked or clogged.  Your catheter is dislodged or comes out.  You have a fever.  You have chills or increased pain. MAKE SURE YOU:  Understand these instructions.  Will watch your condition.  Will get help right away if you are not doing well or get worse.     This information is not intended to replace advice given to you by your health care provider. Make sure you discuss any questions you have with your health care provider.   Document Released: 05/20/2013 Document Revised: 08/04/2013 Document Reviewed: 05/20/2013 Elsevier Interactive Patient Education [employer].

## 2015-07-10 NOTE — Progress Notes (Signed)
Patient ID: Destiny Velasquez, female   DOB: 1925-08-16, 81 y.o.   MRN: [id number]    Subjective: Off TNA.  Mobility much better.     Objective: Vital signs in last 24 hours: Temp:  [98.2 F (36.8 C)-98.4 F (36.9 C)] 98.2 F (36.8 C) (11/27 0610) Pulse Rate:  [82-92] 91 (11/27 0610) Resp:  [16-18] 16 (11/27 0610) BP: (149-154)/(57-65) 149/57 mmHg (11/27 0610) SpO2:  [92 %-98 %] 92 % (11/27 0610) Last BM Date: 07/09/15  Intake/Output from previous day: 11/26 0701 - 11/27 0700 In: 84 [P.O.:480; I.V.:10] Out: 50 [Drains:50] Intake/Output this shift:    PE: General- In NAD.  Much more alert.  Doing crossword puzzle again.   Abdomen-soft, non distended.  Non tender.  Drain bilious.    Lab Results:   Recent Labs  07/09/15 0500  WBC 11.1*  HGB 10.1*  HCT 30.7*  PLT 215   BMET  Recent Labs  07/08/15 0425 07/09/15 0500  NA 136 132*  K 3.7 3.6  CL 105 101  CO2 25 26  GLUCOSE 128* 130*  BUN 9 9  CREATININE 0.88 0.85  CALCIUM 8.7* 8.6*   PT/INR No results for input(s): LABPROT, INR in the last 72 hours. Comprehensive Metabolic Panel:    Component Value Date/Time   NA 132* 07/09/2015 0500   NA 136 07/08/2015 0425   NA 139 03/30/2015 0813   NA 139 09/20/2014 0816   K 3.6 07/09/2015 0500   K 3.7 07/08/2015 0425   CL 101 07/09/2015 0500   CL 105 07/08/2015 0425   CO2 26 07/09/2015 0500   CO2 25 07/08/2015 0425   BUN 9 07/09/2015 0500   BUN 9 07/08/2015 0425   BUN 18 03/30/2015 0813   BUN 18 09/20/2014 0816   CREATININE 0.85 07/09/2015 0500   CREATININE 0.88 07/08/2015 0425   GLUCOSE 130* 07/09/2015 0500   GLUCOSE 128* 07/08/2015 0425   GLUCOSE 119* 03/30/2015 0813   GLUCOSE 132* 09/20/2014 0816   CALCIUM 8.6* 07/09/2015 0500   CALCIUM 8.7* 07/08/2015 0425   AST 14* 07/07/2015 0530   AST 24 07/02/2015 0026   ALT 13* 07/07/2015 0530   ALT 17 07/02/2015 0026   ALKPHOS 66 07/07/2015 0530   ALKPHOS 84 07/02/2015 0026   BILITOT 0.5 07/07/2015 0530   BILITOT  0.6 07/02/2015 0026   BILITOT 0.3 09/20/2014 0816   PROT 5.1* 07/07/2015 0530   PROT 6.9 07/02/2015 0026   PROT 6.6 09/20/2014 0816   PROT 6.8 03/17/2014 0807   ALBUMIN 2.0* 07/07/2015 0530   ALBUMIN 3.0* 07/02/2015 0026   ALBUMIN 3.9 09/20/2014 0816   ALBUMIN 3.9 03/17/2014 0807     Studies/Results: No results found.  Anti-infectives: Anti-infectives    Start     Dose/Rate Route Frequency Ordered Stop   07/09/15 1245  amoxicillin-clavulanate (AUGMENTIN) 875-125 MG per tablet 1 tablet     1 tablet Oral Every 12 hours 07/09/15 1234     07/05/15 0815  piperacillin-tazobactam (ZOSYN) IVPB 3.375 g  Status:  Discontinued     3.375 g 12.5 mL/hr over 240 Minutes Intravenous Every 8 hours 07/05/15 0813 07/09/15 1234   07/04/15 2000  cefTRIAXone (ROCEPHIN) 1 g in dextrose 5 % 50 mL IVPB  Status:  Discontinued     1 g 100 mL/hr over 30 Minutes Intravenous Every 24 hours 07/04/15 1255 07/05/15 0805   07/02/15 1200  aztreonam (AZACTAM) 500 mg in dextrose 5 % 50 mL IVPB  Status:  Discontinued  500 mg 100 mL/hr over 30 Minutes Intravenous Every 8 hours 07/02/15 0545 07/04/15 1255   07/02/15 0400  aztreonam (AZACTAM) 1 g in dextrose 5 % 50 mL IVPB     1 g 100 mL/hr over 30 Minutes Intravenous  Once 07/02/15 0346 07/02/15 0418      Assessment Principal Problem:   Pyelonephritis   Postop ileus   Postop biloma   Failure to thrive   Moderate PC malnutrition   Adenocarcinoma of liver s/p partial right hepatic lobectomy 06/14/2015     LOS: 8 days   Plan:  OK to d/c when bed available. Would do 1 week additional antibiotic (augmentin). Follow up in 2 weeks.    [facility] 07/10/2015

## 2015-07-10 NOTE — Progress Notes (Signed)
[facility]                                                                              Patient Demographics  Destiny Velasquez, is a 81 y.o. female, DOB - 02-05-1926, RCV:[id number]  Admit date - 07/01/2015   Admitting Physician Theressa Millard, MD  Outpatient Primary MD for the patient is [name], [name], DO  LOS - 8  Subjective:   Seen with her daughter at bedside, tolerating soft diet. Feeling much better, less reflux but is still has some burping and gas.  Chief Complaint  Patient presents with  . Elevated WBC       HPI on 07/02/2015 by Dr. Jana Hakim Destiny Velasquez is a 81 y.o. female with history of Liver Adenocarcinoma S/P resection on 06/15/2015 currently in the [facility] for Rehab Rx who was sent to the ED after laboratory results revealed a persistent Elevated WBC despite 10 days of antibiotic rx for a UTI with Levaquin. She was evaluated in the ED and also found to have an elevated BUN/Cr of 41/1.82. A repeat Urine Culture was sent and she was placed on IV Azactam and referred for admission.  One of her daughters is at bedside and reports that she has observed her to have dysphagia x 2 -3 days, and difficulty swallowing her medications  Interim history Patient being treated for UTI, which was resistant to cipro (levaquin given as an outpatient).  CT abd showed 9cm fluid collection, ?abscess.  Percutaneous drain placed on 07/05/15  Assessment & Plan   Urinary tract infection -UA on 06/22/2015 was positive for infection however no urine culture done at that time.  -UA 07/02/2015 shows WBC TNTC, large leukocytes, many bacteria -Urine culture: Ecoli mainly pan sensitive (resitant to ampicillin and cipro) -Blood cultures show no growth -Completed intended course of antibiotics with Zosyn.  Right hepatic lobe bed 9 cm fluid collection -Seen on CT scan -?Abscess vs postop fluid collections, however culture is NGTD. -Gen surgery consulted and appreciated,  percutaneous drain placed by IR. -Has some troubles tolerating diet postoperatively, able to advance to soft diet. -Was on Zosyn, placed on Augmentin, continue for 1 more week.  Acute on chronic kidney injury, stage III -Likely secondary to dehydration -Creatinine on admission 1.82, currently 0.74 -Continue to monitor BMP  Dysphagia -Speech therapy consultation appreciated, recommended regular diet, medication: crushed with puree  Hyponatremia -Resolved, Sodium currently 135, continue monitor BMP -TSH 03/30/2015 1.6 -Continue IVF  Hypokalemia -Was not able to take oral supplements yesterday, continued to have hypokalemia.  Hypothyroidism -Continue Synthroid -TSH 03/30/2015 1.6  Essential hypertension -Blood pressure currently stable -Losartan and spironolatone/HCTZ held  Hyperlipidemia -Patient no longer taking statin, should follow up with her primary care physician   Adenocarcinoma liver -Status post hepatic lobectomy 06/14/2015 -Stable  -General surgery consulted and appreciated  Physical deconditioning -PT rec SNF  Code Status: Full  Family Communication: Daughters at bedside  Disposition Plan: Discharged likely on Monday morning.  Time Spent in minutes 30 minutes  Procedures  None  Consults  General surgery Interventional radiology   DVT Prophylaxis Lovenox  Lab Results  Component Value Date   PLT 215 07/09/2015  Medications  Scheduled Meds: . amoxicillin-clavulanate  1 tablet Oral Q12H  . antiseptic oral rinse  7 mL Mouth Rinse BID  . aspirin  81 mg Oral Daily  . enoxaparin (LOVENOX) injection  40 mg Subcutaneous Q24H  . levothyroxine  100 mcg Oral QAC breakfast  . pantoprazole  40 mg Oral BID AC  . sodium chloride  10-40 mL Intracatheter Q12H   Continuous Infusions:   PRN Meds:.acetaminophen **OR** acetaminophen, alum & mag hydroxide-simeth, HYDROmorphone (DILAUDID) injection, LORazepam, ondansetron **OR** ondansetron (ZOFRAN)  IV, oxyCODONE, sodium chloride  Antibiotics    Anti-infectives    Start     Dose/Rate Route Frequency Ordered Stop   07/09/15 1245  amoxicillin-clavulanate (AUGMENTIN) 875-125 MG per tablet 1 tablet     1 tablet Oral Every 12 hours 07/09/15 1234     07/05/15 0815  piperacillin-tazobactam (ZOSYN) IVPB 3.375 g  Status:  Discontinued     3.375 g 12.5 mL/hr over 240 Minutes Intravenous Every 8 hours 07/05/15 0813 07/09/15 1234   07/04/15 2000  cefTRIAXone (ROCEPHIN) 1 g in dextrose 5 % 50 mL IVPB  Status:  Discontinued     1 g 100 mL/hr over 30 Minutes Intravenous Every 24 hours 07/04/15 1255 07/05/15 0805   07/02/15 1200  aztreonam (AZACTAM) 500 mg in dextrose 5 % 50 mL IVPB  Status:  Discontinued     500 mg 100 mL/hr over 30 Minutes Intravenous Every 8 hours 07/02/15 0545 07/04/15 1255   07/02/15 0400  aztreonam (AZACTAM) 1 g in dextrose 5 % 50 mL IVPB     1 g 100 mL/hr over 30 Minutes Intravenous  Once 07/02/15 0346 07/02/15 0418       Objective:   Filed Vitals:   07/09/15 0359 07/09/15 1500 07/09/15 2027 07/10/15 0610  BP: 153/71 154/64 154/65 149/57  Pulse: 92 82 92 91  Temp: 98.1 F (36.7 C) 98.4 F (36.9 C) 98.2 F (36.8 C) 98.2 F (36.8 C)  TempSrc: Oral Oral Oral Oral  Resp: '18 18 16 16  '$ Height:      Weight:      SpO2: 97% 98% 95% 92%    Wt Readings from Last 3 Encounters:  07/02/15 62.869 kg (138 lb 9.6 oz)  06/14/15 65.318 kg (144 lb)  06/22/15 65.318 kg (144 lb)     Intake/Output Summary (Last 24 hours) at 07/10/15 1015 Last data filed at 07/10/15 0615  Gross per 24 hour  Intake    250 ml  Output     50 ml  Net    200 ml    Exam  General: Well developed, well nourished, NAD  HEENT: NCAT,  mucous membranes moist.   Cardiovascular: S1 S2 auscultated, RRR, no murmurs  Respiratory: Clear to auscultation   Abdomen: Soft, nontender, nondistended, + bowel sounds  Extremities: warm dry without cyanosis clubbing or edema  Neuro: AAOx3,  nonfocal  Psych: Normal affect and demeanor, somewhat irritable today  Data Review   Micro Results Recent Results (from the past 240 hour(s))  Blood Culture (routine x 2)     Status: None   Collection Time: 07/02/15 12:20 AM  Result Value Ref Range Status   Specimen Description BLOOD LEFT ANTECUBITAL  Final   Special Requests BOTTLES DRAWN AEROBIC AND ANAEROBIC 5ML  Final   Culture   Final    NO GROWTH 5 DAYS Performed at [facility]    Report Status 07/07/2015 FINAL  Final  Blood Culture (routine x 2)  Status: None   Collection Time: 07/02/15 12:27 AM  Result Value Ref Range Status   Specimen Description BLOOD LEFT HAND  Final   Special Requests IN PEDIATRIC BOTTLE 5CC  Final   Culture   Final    NO GROWTH 5 DAYS Performed at [facility]    Report Status 07/07/2015 FINAL  Final  Urine culture     Status: None   Collection Time: 07/02/15  1:41 AM  Result Value Ref Range Status   Specimen Description URINE, CLEAN CATCH  Final   Special Requests NONE  Final   Culture   Final    >=100,000 COLONIES/mL ESCHERICHIA COLI Performed at [facility]    Report Status 07/04/2015 FINAL  Final   Organism ID, Bacteria ESCHERICHIA COLI  Final      Susceptibility   Escherichia coli - MIC*    AMPICILLIN >=32 RESISTANT Resistant     CEFAZOLIN <=4 SENSITIVE Sensitive     CEFTRIAXONE <=1 SENSITIVE Sensitive     CIPROFLOXACIN >=4 RESISTANT Resistant     GENTAMICIN <=1 SENSITIVE Sensitive     IMIPENEM 0.5 SENSITIVE Sensitive     NITROFURANTOIN <=16 SENSITIVE Sensitive     TRIMETH/SULFA <=20 SENSITIVE Sensitive     AMPICILLIN/SULBACTAM 16 INTERMEDIATE Intermediate     PIP/TAZO 8 SENSITIVE Sensitive     * >=100,000 COLONIES/mL ESCHERICHIA COLI  Culture, routine-abscess     Status: None   Collection Time: 07/05/15  3:14 PM  Result Value Ref Range Status   Specimen Description ABSCESS ABDOMEN RIGHT  Final   Special Requests NONE  Final   Gram Stain   Final     RARE WBC PRESENT, PREDOMINANTLY PMN NO SQUAMOUS EPITHELIAL CELLS SEEN NO ORGANISMS SEEN Performed at [facility]    Culture   Final    NO GROWTH 3 DAYS Performed at [facility]    Report Status 07/09/2015 FINAL  Final    Radiology Reports Dg Chest 2 View  07/02/2015  CLINICAL DATA:  Right upper quadrant pain. Cough with swallowing. Leukocytosis. EXAM: CHEST  2 VIEW COMPARISON:  PET-CT 05/17/2015 FINDINGS: Heart at the upper limits of normal in size, atherosclerosis of the thoracic aorta. Linear opacity in the right perihilar lung consistent with atelectasis. No consolidation to suggest pneumonia. Pulmonary vasculature is normal. No consolidation, pleural effusion, or pneumothorax. No acute osseous abnormalities are seen. IMPRESSION: Linear right perihilar atelectasis.  No pneumonia. Electronically Signed   By: Jeb Levering M.D.   On: 07/02/2015 00:47   Ct Head Wo Contrast  06/16/2015  CLINICAL DATA:  Mild facial droop, no other neurological deficits EXAM: CT HEAD WITHOUT CONTRAST TECHNIQUE: Contiguous axial images were obtained from the base of the skull through the vertex without intravenous contrast. COMPARISON:  None. FINDINGS: Diffuse age-related cortical atrophy as well as diffuse low attenuation in the deep white matter. There is no evidence of vascular territory cortical infarct. There is no evidence of mass. There is no hemorrhage or extra-axial fluid. There is no hydrocephalus. Sub cm focus of low attenuation left pons. Sub cm calcified meningioma right frontal region. Calvarium intact. No significant inflammatory change in the visualized portions of the paranasal sinuses. IMPRESSION: Chronic involutional change with no hemorrhage or vascular territory infarct. Tiny focus of low attenuation left pons measuring a few mm may reflect another focus of chronic white matter degeneration versus tiny lacunar infarct of uncertain age. This could be better evaluated with MRI if  indicated. Electronically Signed  By: Skipper Cliche M.D.   On: 06/16/2015 08:38   Mr Jeri Cos XB Contrast  06/22/2015  CLINICAL DATA:  Abnormal CT scan of the head. EXAM: MRI HEAD WITHOUT AND WITH CONTRAST TECHNIQUE: Multiplanar, multiecho pulse sequences of the brain and surrounding structures were obtained without and with intravenous contrast. CONTRAST:  82m MULTIHANCE GADOBENATE DIMEGLUMINE 529 MG/ML IV SOLN COMPARISON:  CT head without contrast FINDINGS: Moderate generalized atrophy and white matter changes are present bilaterally. No acute infarct, hemorrhage, or mass lesion is present. The ventricles are of normal size. The ventricles are proportionate to the degree of atrophy. No significant extra-axial fluid collection is present. Is brainstem and cerebellum are within normal limits. Flow is present in the major intracranial arteries. Bilateral lens replacements are present. The globes and orbits are otherwise intact. A single posterior right ethmoid air cell is opacified. The remaining paranasal sinuses and mastoid air cells are clear. Postcontrast images demonstrate no pathologic enhancement. IMPRESSION: 1. Moderate atrophy and white matter disease. This likely reflects the sequela of chronic microvascular ischemia. 2. No acute infarct. Electronically Signed   By: CSan MorelleM.D.   On: 06/22/2015 19:50   UKoreaAbscess Drain  07/05/2015  CLINICAL DATA:  81year old female with a history of poorly differentiated carcinoma of the right liver lobe. Status post resection, has developed what appears to be abscess of the surgical site within the right liver lobe. Differential diagnosis includes biloma and abscess. EXAM: ULTRASOUND GUIDED DRAIN PLACEMENT OF RIGHT LIVER LOBE MEDICATIONS: No sedation. PROCEDURE: The procedure, risks, benefits, and alternatives were explained to the patient. Questions regarding the procedure were encouraged and answered. The patient understands and consents to the  procedure. Ultrasound survey of the right upper quadrant was performed. Images were stored and sent to PACs. The right upper quadrant anterior wall was prepped with Betadine in a sterile fashion, and a sterile drape was applied covering the operative field. A sterile gown and sterile gloves were used for the procedure. Local anesthesia was provided with 1% Lidocaine. Once the patient is prepped and draped in the usual sterile fashion, the skin and subcutaneous tissues were generously infiltrated with 1% lidocaine for local anesthesia. A small stab incision was made with 11 blade scalpel. Trocar technique was used to advance a 10 FPakistandrain in a transhepatic trajectory into the fluid collection of the rib surgical site and right upper abdomen. Bilious drainage was returned. Sample was sent to the lab. Pigtail catheter was formed and a final image was stored. Patient tolerated the procedure well and remained hemodynamically stable throughout. No complications encountered and no significant blood loss encountered. Drain was sutured into position and left to gravity drainage. COMPLICATIONS: None. FINDINGS: Ultrasound survey demonstrates complex fluid collection within the right upper quadrant with hyperechoic central aspect, which may represent fat necrosis or omental infarct. Bilious fluid was easily aspirated once the drain was in place. IMPRESSION: Status post ultrasound-guided drainage of biloma of the surgical site. Ten FPakistandrain placed. Signed, JDulcy Fanny WEarleen Newport DO [facility] Center[facility] Electronically Signed   By: JCorrie MckusickD.O.   On: 07/05/2015 17:59   Ct Abdomen Pelvis W Contrast  07/04/2015  CLINICAL DATA:  Three weeks status post resection of hepatic adenocarcinoma. Nausea. Persistent leukocytosis. EXAM: CT ABDOMEN AND PELVIS WITH CONTRAST TECHNIQUE: Multidetector CT imaging of the abdomen and pelvis was performed using the standard protocol  following bolus administration of intravenous contrast. CONTRAST:  1042mOMNIPAQUE IOHEXOL 300 MG/ML  SOLN COMPARISON:  05/03/2015 FINDINGS: Lower chest: Small bilateral pleural effusions noted as well as tiny pericardial effusion. Mild wall thickening of distal thoracic esophagus is suspicious for esophagitis. Hepatobiliary: There has been interval resection of previously seen right hepatic lobe mass. There is an irregular rim enhancing fluid collection within the surgical bed in the right hepatic lobe which measures 8.1 x 8.8 cm on image 32/series 3. This could be due to postop fluid collection or abscess. No other liver lesions are identified. A small rim enhancing perihepatic fluid collection is also seen adjacent to the anterior right hepatic lobe which measures 1.8 x 7.8 cm on image 26/ series 3. This could also represent postop fluid collection or abscess. Prior cholecystectomy again noted. Dilatation of extrahepatic common bile duct remains stable. Pancreas: No mass, inflammatory changes, or other significant abnormality. Spleen: Within normal limits in size and appearance. Adrenals/Urinary Tract: No masses identified. No evidence of hydronephrosis. Mild diffuse bladder wall thickening noted as well as mild dilatation, possibly due to neurogenic bladder. Stomach/Bowel: Diverticulitis near the junction of the descending and sigmoid colon shows improvement since previous study. Vascular/Lymphatic: No pathologically enlarged lymph nodes. No evidence of abdominal aortic aneurysm. Reproductive: Previous hysterectomy a 3.8 cm cyst with a tiny posterior mural calcification is seen in the left adnexa on image 96 98/series 3 which is not significant changed since previous study. No definite malignant features identified. This could represent a diverticular abscess or cystic lesion of left ovary. Other: Small amount of free fluid in pelvic cul-de-sac. Musculoskeletal: No suspicious bone lesions identified. Spondylosis  grade 2 anterolisthesis noted at L4-5. IMPRESSION: 9 cm rim enhancing fluid collection in right hepatic lobe surgical bed, and smaller perihepatic fluid collection adjacent to right lobe. Differential diagnosis includes postop fluid collections and abscess. Improving diverticulitis. Persistent 3.8 cm left lower quadrant fluid collection, which could represent a diverticular abscess or left ovarian cystic lesion. No malignant features identified. Recommend continued attention on follow-up imaging. Small bilateral pleural effusions, tiny pericardial effusion, and small amount free fluid in pelvic cul-de-sac. Distal esophageal wall thickening, suspicious for esophagitis. Mildly distended urinary bladder with diffuse wall thickening, suspicious for neurogenic bladder. Electronically Signed   By: Earle Gell M.D.   On: 07/04/2015 15:15    CBC  Recent Labs Lab 07/04/15 0444 07/05/15 0415 07/06/15 0428 07/07/15 0530 07/09/15 0500  WBC 9.3 14.5* 13.3* 12.6* 11.1*  HGB 10.6* 11.0* 10.7* 9.5* 10.1*  HCT 31.8* 33.5* 32.6* 28.7* 30.7*  PLT 243 302 269 219 215  MCV 85.9 85.9 86.0 84.2 84.3  MCH 28.6 28.2 28.2 27.9 27.7  MCHC 33.3 32.8 32.8 33.1 32.9  RDW 13.7 13.8 13.8 13.6 13.7  LYMPHSABS  --   --   --  1.6  --   MONOABS  --   --   --  1.1*  --   EOSABS  --   --   --  0.8*  --   BASOSABS  --   --   --  0.1  --     Chemistries   Recent Labs Lab 07/05/15 0415 07/06/15 0428 07/07/15 0530 07/08/15 0425 07/09/15 0500  NA 135 139 138 136 132*  K 3.5 3.2* 3.3* 3.7 3.6  CL 106 110 108 105 101  CO2 '23 24 26 25 26  '$ GLUCOSE 112* 109* 136* 128* 130*  BUN '9 8 8 9 9  '$ CREATININE 0.74 0.87 0.74 0.88 0.85  CALCIUM 9.0 8.7* 8.6* 8.7* 8.6*  MG  --   --  1.5* 1.9  --   AST  --   --  14*  --   --   ALT  --   --  13*  --   --   ALKPHOS  --   --  66  --   --   BILITOT  --   --  0.5  --   --     ------------------------------------------------------------------------------------------------------------------ estimated creatinine clearance is 41.7 mL/min (by C-G formula based on Cr of 0.85). ------------------------------------------------------------------------------------------------------------------ No results for input(s): HGBA1C in the last 72 hours. ------------------------------------------------------------------------------------------------------------------ No results for input(s): CHOL, HDL, LDLCALC, TRIG, CHOLHDL, LDLDIRECT in the last 72 hours. ------------------------------------------------------------------------------------------------------------------ No results for input(s): TSH, T4TOTAL, T3FREE, THYROIDAB in the last 72 hours.  Invalid input(s): FREET3 ------------------------------------------------------------------------------------------------------------------ No results for input(s): VITAMINB12, FOLATE, FERRITIN, TIBC, IRON, RETICCTPCT in the last 72 hours.  Coagulation profile  Recent Labs Lab 07/05/15 0415  INR 1.13    No results for input(s): DDIMER in the last 72 hours.  Cardiac Enzymes No results for input(s): CKMB, TROPONINI, MYOGLOBIN in the last 168 hours.  Invalid input(s): CK ------------------------------------------------------------------------------------------------------------------ Invalid input(s): POCBNP    Katilyn Miltenberger A MD. on 07/10/2015 at 10:15 AM  Between 7am to 7pm - Pager - [phone]  After 7pm go to [email or web address] - password [email or web address]  And look for the night coverage person covering for me after hours  [facility] Office  [phone]

## 2015-07-11 ENCOUNTER — Other Ambulatory Visit: Payer: Self-pay | Admitting: General Surgery

## 2015-07-11 ENCOUNTER — Other Ambulatory Visit: Payer: Self-pay | Admitting: Radiology

## 2015-07-11 DIAGNOSIS — E039 Hypothyroidism, unspecified: Secondary | ICD-10-CM | POA: Diagnosis not present

## 2015-07-11 DIAGNOSIS — E440 Moderate protein-calorie malnutrition: Secondary | ICD-10-CM | POA: Diagnosis not present

## 2015-07-11 DIAGNOSIS — F05 Delirium due to known physiological condition: Secondary | ICD-10-CM | POA: Diagnosis not present

## 2015-07-11 DIAGNOSIS — N10 Acute tubulo-interstitial nephritis: Secondary | ICD-10-CM | POA: Diagnosis not present

## 2015-07-11 DIAGNOSIS — E785 Hyperlipidemia, unspecified: Secondary | ICD-10-CM | POA: Diagnosis not present

## 2015-07-11 DIAGNOSIS — J90 Pleural effusion, not elsewhere classified: Secondary | ICD-10-CM | POA: Diagnosis not present

## 2015-07-11 DIAGNOSIS — R509 Fever, unspecified: Secondary | ICD-10-CM | POA: Diagnosis not present

## 2015-07-11 DIAGNOSIS — M5136 Other intervertebral disc degeneration, lumbar region: Secondary | ICD-10-CM | POA: Diagnosis not present

## 2015-07-11 DIAGNOSIS — E46 Unspecified protein-calorie malnutrition: Secondary | ICD-10-CM | POA: Diagnosis not present

## 2015-07-11 DIAGNOSIS — T8189XD Other complications of procedures, not elsewhere classified, subsequent encounter: Secondary | ICD-10-CM | POA: Diagnosis not present

## 2015-07-11 DIAGNOSIS — K668 Other specified disorders of peritoneum: Secondary | ICD-10-CM | POA: Diagnosis not present

## 2015-07-11 DIAGNOSIS — Z90710 Acquired absence of both cervix and uterus: Secondary | ICD-10-CM | POA: Diagnosis not present

## 2015-07-11 DIAGNOSIS — Z09 Encounter for follow-up examination after completed treatment for conditions other than malignant neoplasm: Secondary | ICD-10-CM | POA: Diagnosis not present

## 2015-07-11 DIAGNOSIS — C229 Malignant neoplasm of liver, not specified as primary or secondary: Secondary | ICD-10-CM | POA: Diagnosis not present

## 2015-07-11 DIAGNOSIS — G250 Essential tremor: Secondary | ICD-10-CM | POA: Diagnosis not present

## 2015-07-11 DIAGNOSIS — R1314 Dysphagia, pharyngoesophageal phase: Secondary | ICD-10-CM | POA: Diagnosis not present

## 2015-07-11 DIAGNOSIS — Z7982 Long term (current) use of aspirin: Secondary | ICD-10-CM | POA: Diagnosis not present

## 2015-07-11 DIAGNOSIS — L0291 Cutaneous abscess, unspecified: Secondary | ICD-10-CM | POA: Diagnosis not present

## 2015-07-11 DIAGNOSIS — C228 Malignant neoplasm of liver, primary, unspecified as to type: Secondary | ICD-10-CM | POA: Diagnosis not present

## 2015-07-11 DIAGNOSIS — M6281 Muscle weakness (generalized): Secondary | ICD-10-CM | POA: Diagnosis not present

## 2015-07-11 DIAGNOSIS — R197 Diarrhea, unspecified: Secondary | ICD-10-CM | POA: Diagnosis not present

## 2015-07-11 DIAGNOSIS — N12 Tubulo-interstitial nephritis, not specified as acute or chronic: Secondary | ICD-10-CM | POA: Diagnosis not present

## 2015-07-11 DIAGNOSIS — N390 Urinary tract infection, site not specified: Secondary | ICD-10-CM | POA: Diagnosis not present

## 2015-07-11 DIAGNOSIS — Z9889 Other specified postprocedural states: Secondary | ICD-10-CM | POA: Diagnosis not present

## 2015-07-11 DIAGNOSIS — K5732 Diverticulitis of large intestine without perforation or abscess without bleeding: Secondary | ICD-10-CM | POA: Diagnosis not present

## 2015-07-11 DIAGNOSIS — R279 Unspecified lack of coordination: Secondary | ICD-10-CM | POA: Diagnosis not present

## 2015-07-11 DIAGNOSIS — E871 Hypo-osmolality and hyponatremia: Secondary | ICD-10-CM | POA: Diagnosis not present

## 2015-07-11 DIAGNOSIS — D72829 Elevated white blood cell count, unspecified: Secondary | ICD-10-CM | POA: Diagnosis not present

## 2015-07-11 DIAGNOSIS — K219 Gastro-esophageal reflux disease without esophagitis: Secondary | ICD-10-CM | POA: Diagnosis not present

## 2015-07-11 DIAGNOSIS — K9189 Other postprocedural complications and disorders of digestive system: Secondary | ICD-10-CM | POA: Diagnosis not present

## 2015-07-11 DIAGNOSIS — C227 Other specified carcinomas of liver: Secondary | ICD-10-CM | POA: Diagnosis not present

## 2015-07-11 DIAGNOSIS — Z9089 Acquired absence of other organs: Secondary | ICD-10-CM | POA: Diagnosis not present

## 2015-07-11 DIAGNOSIS — N189 Chronic kidney disease, unspecified: Secondary | ICD-10-CM | POA: Diagnosis not present

## 2015-07-11 DIAGNOSIS — R410 Disorientation, unspecified: Secondary | ICD-10-CM | POA: Diagnosis not present

## 2015-07-11 DIAGNOSIS — N179 Acute kidney failure, unspecified: Secondary | ICD-10-CM | POA: Diagnosis not present

## 2015-07-11 DIAGNOSIS — I2510 Atherosclerotic heart disease of native coronary artery without angina pectoris: Secondary | ICD-10-CM | POA: Diagnosis not present

## 2015-07-11 DIAGNOSIS — R278 Other lack of coordination: Secondary | ICD-10-CM | POA: Diagnosis not present

## 2015-07-11 DIAGNOSIS — Z87891 Personal history of nicotine dependence: Secondary | ICD-10-CM | POA: Diagnosis not present

## 2015-07-11 DIAGNOSIS — C7A1 Malignant poorly differentiated neuroendocrine tumors: Secondary | ICD-10-CM | POA: Diagnosis not present

## 2015-07-11 DIAGNOSIS — R601 Generalized edema: Secondary | ICD-10-CM | POA: Diagnosis not present

## 2015-07-11 DIAGNOSIS — Z853 Personal history of malignant neoplasm of breast: Secondary | ICD-10-CM | POA: Diagnosis not present

## 2015-07-11 DIAGNOSIS — Z23 Encounter for immunization: Secondary | ICD-10-CM | POA: Diagnosis not present

## 2015-07-11 DIAGNOSIS — R2689 Other abnormalities of gait and mobility: Secondary | ICD-10-CM | POA: Diagnosis not present

## 2015-07-11 DIAGNOSIS — F418 Other specified anxiety disorders: Secondary | ICD-10-CM | POA: Diagnosis not present

## 2015-07-11 DIAGNOSIS — K7689 Other specified diseases of liver: Secondary | ICD-10-CM | POA: Diagnosis not present

## 2015-07-11 DIAGNOSIS — E079 Disorder of thyroid, unspecified: Secondary | ICD-10-CM | POA: Diagnosis not present

## 2015-07-11 DIAGNOSIS — C787 Secondary malignant neoplasm of liver and intrahepatic bile duct: Secondary | ICD-10-CM | POA: Diagnosis not present

## 2015-07-11 DIAGNOSIS — Z9049 Acquired absence of other specified parts of digestive tract: Secondary | ICD-10-CM | POA: Diagnosis not present

## 2015-07-11 DIAGNOSIS — Z4803 Encounter for change or removal of drains: Secondary | ICD-10-CM | POA: Diagnosis not present

## 2015-07-11 DIAGNOSIS — K651 Peritoneal abscess: Secondary | ICD-10-CM | POA: Diagnosis not present

## 2015-07-11 DIAGNOSIS — I10 Essential (primary) hypertension: Secondary | ICD-10-CM | POA: Diagnosis not present

## 2015-07-11 DIAGNOSIS — K750 Abscess of liver: Secondary | ICD-10-CM | POA: Diagnosis not present

## 2015-07-11 MED ORDER — LORAZEPAM 0.5 MG PO TABS: 0.5000 mg | ORAL_TABLET | Freq: Four times a day (QID) | ORAL | Status: DC | PRN

## 2015-07-11 MED ORDER — SIMETHICONE 125 MG PO CHEW: 125.0000 mg | CHEWABLE_TABLET | Freq: Three times a day (TID) | ORAL | Status: DC

## 2015-07-11 MED ORDER — PANTOPRAZOLE SODIUM 40 MG PO TBEC: 40.0000 mg | DELAYED_RELEASE_TABLET | Freq: Every day | ORAL | Status: DC

## 2015-07-11 MED ORDER — TRAMADOL HCL 50 MG PO TABS: 50.0000 mg | ORAL_TABLET | Freq: Four times a day (QID) | ORAL | Status: DC | PRN

## 2015-07-11 MED ORDER — AMOXICILLIN-POT CLAVULANATE 250-62.5 MG/5ML PO SUSR: 875.0000 mg | Freq: Two times a day (BID) | ORAL | Status: DC

## 2015-07-11 MED ORDER — HYDROCODONE-ACETAMINOPHEN 5-325 MG PO TABS: 1.0000 | ORAL_TABLET | Freq: Four times a day (QID) | ORAL | Status: DC | PRN

## 2015-07-11 NOTE — Progress Notes (Signed)
Percutaneous drain dsg changed per order, site unremarkable.

## 2015-07-11 NOTE — Progress Notes (Signed)
Patient had an episode of low back pain last night. Pain management therapy consisted of PRN dosing of po Oxycodone. Pain episode was accompanied by a concurrent complaint of rising anxiety level, with consequential sleep difficulty. Patient requested for concomitant administration of Oxycodone with Lorazepam, with good outcome. Patient slept well the rest of the night, without further complaints voiced.

## 2015-07-11 NOTE — Progress Notes (Signed)
Patient d/c to SNF via private car,accompanied by daughter. Patient is stable.

## 2015-07-11 NOTE — Progress Notes (Signed)
Discharge instructions given to daughter,verbalized understanding,teach back utilized. Patient denies pain,ready to go to SNF.

## 2015-07-11 NOTE — Progress Notes (Signed)
Pt for discharge to [facility].  CSW facilitated pt discharge needs including contacting facility, faxing pt discharge information via [facility], discussing with pt daughter, Bev, providing RN phone number to call report, and provided discharge packet to pt nurse to provide to pt and pt daughter upon discharge as pt daughter plans to transport pt via private vehicle.   Pt daughter disappointed that [facility] nor [facility] had a bed available, but agreeable to plan for [facility].   No further social work needs identified at this time.  CSW signing off.   Alison Murray, MSW, [facility] [phone]

## 2015-07-11 NOTE — Care Management Important Message (Signed)
Important Message  Patient Details  Name: Destiny Velasquez MRN: [id number] Date of Birth: 1926/03/12   Medicare Important Message Given:  Yes    Camillo Flaming 07/11/2015, 11:33 AMImportant Message  Patient Details  Name: Destiny Velasquez MRN: [id number] Date of Birth: 09/16/1925   Medicare Important Message Given:  Yes    Camillo Flaming 07/11/2015, 11:33 AM

## 2015-07-11 NOTE — Discharge Summary (Signed)
Physician Discharge Summary  Destiny Velasquez MOQ:[id number] DOB: 07/31/1926 DOA: 07/01/2015  PCP: Hollace Kinnier, DO  Admit date: 07/01/2015 Discharge date: 07/11/2015  Time spent: 40 minutes  Recommendations for Outpatient Follow-up:  1. Follow-up with interventional radiology for percutaneous transhepatic drain in 1 week. 2. Follow-up with general surgery   Discharge Diagnoses:  Principal Problem:   Pyelonephritis Active Problems:   Hyperlipidemia   Thyroid disease   Essential hypertension   Adenocarcinoma of liver s/p partial right hepatic lobectomy 06/14/2015   Dysphagia   Leukocytosis   Acute-on-chronic kidney injury ([place])   Hyponatremia   Urinary tract infection, site not specified   Abscess   Abdominal abscess [facility])   Discharge Condition: Stable  Diet recommendation: Heart healthy  Filed Weights   07/02/15 0550  Weight: 62.869 kg (138 lb 9.6 oz)    History of present illness:  Destiny Velasquez is a 81 y.o. female with history of Liver Adenocarcinoma S/P resection on 06/15/2015 currently in the [facility] for Rehab Rx who was sent to the ED after laboratory results revealed a persistent Elevated WBC despite 10 days of antibiotic rx for a UTI with Levaquin. She was evaluated in the ED and also found to have an elevated BUN/Cr of 41/1.82. A repeat Urine Culture was sent and she was placed on IV Azactam and referred for admission.   One of her daughters is at bedside and reports that she has observed her to have dysphagia x 2 -3 days, and difficulty swallowing her medications.  Hospital Course:   Urinary tract infection -UA on 06/22/2015 was positive for infection however no urine culture done at that time.  -UA 07/02/2015 shows WBC TNTC, large leukocytes, many bacteria -Urine culture: Ecoli mainly pan sensitive (resitant to ampicillin and cipro) -Blood cultures show no growth -Completed intended course of antibiotics with Zosyn.  Right hepatic lobe bed 9 cm  fluid collection -Seen on CT scan, -?Abscess, versus biloma vs postop fluid collections, however culture is NGTD. -Gen surgery consulted and appreciated, percutaneous drain placed by IR. -Has some troubles tolerating diet postoperatively, able to advance to soft diet. -Was on Zosyn, placed on Augmentin, continue for 1 more week, per surgical service recommendation.  Acute on chronic kidney injury, stage III -Likely secondary to dehydration -Creatinine on admission 1.82, currently 0.74 -Continue to monitor BMP  Dysphagia -Speech therapy consultation appreciated, recommended regular diet, medication: crushed with puree  Hyponatremia -Resolved, Sodium currently 135, continue monitor BMP -TSH 03/30/2015 1.6 -Continue IVF  Hypokalemia -Was not able to take oral supplements yesterday, continued to have hypokalemia.  Hypothyroidism -Continue Synthroid -TSH 03/30/2015 1.6  Essential hypertension -Blood pressure currently stable -Losartan and spironolatone/HCTZ held  Hyperlipidemia -Patient no longer taking statin, should follow up with her primary care physician   Adenocarcinoma liver -Status post hepatic lobectomy 06/14/2015 -Stable  -General surgery consulted and appreciated  Physical deconditioning -PT rec SNF  Flatulence -Patient complaining about gas and bloating after meals. -Patient is on Protonix, simethicone added to be administered before meals.   Procedures:  Placement of a 10 French transhepatic percutaneous drain into biloma, done by Dr. Earleen Newport on 07/05/2015  Consultations: General surgery Interventional radiology   Discharge Exam: Filed Vitals:   07/10/15 2016 07/11/15 0501  BP: 151/68 148/61  Pulse: 86 84  Temp: 98.4 F (36.9 C) 97.8 F (36.6 C)  Resp: 16 16   General: Alert and awake, oriented x3, not in any acute distress. HEENT: anicteric sclera, pupils reactive to light and accommodation, EOMI CVS:  S1-S2 clear, no murmur rubs or  gallops Chest: clear to auscultation bilaterally, no wheezing, rales or rhonchi Abdomen: soft nontender, nondistended, normal bowel sounds, no organomegaly Extremities: no cyanosis, clubbing or edema noted bilaterally Neuro: Cranial nerves II-XII intact, no focal neurological deficits  Discharge Instructions   Discharge Instructions    Diet - low sodium heart healthy    Complete by:  As directed      Increase activity slowly    Complete by:  As directed           Current Discharge Medication List    START taking these medications   Details  amoxicillin-clavulanate (AUGMENTIN) 250-62.5 MG/5ML suspension Take 17.5 mLs (875 mg total) by mouth 2 (two) times daily.    pantoprazole (PROTONIX) 40 MG tablet Take 1 tablet (40 mg total) by mouth daily.      CONTINUE these medications which have CHANGED   Details  HYDROcodone-acetaminophen (NORCO/VICODIN) 5-325 MG tablet Take 1 tablet by mouth every 6 (six) hours as needed for moderate pain. Qty: 10 tablet, Refills: 0    LORazepam (ATIVAN) 0.5 MG tablet Take 1 tablet (0.5 mg total) by mouth every 6 (six) hours as needed for anxiety. Qty: 10 tablet, Refills: 1    simethicone (MYLICON) 485 MG chewable tablet Chew 1 tablet (125 mg total) by mouth 3 (three) times daily before meals. Qty: 30 tablet, Refills: 0    traMADol (ULTRAM) 50 MG tablet Take 1 tablet (50 mg total) by mouth every 6 (six) hours as needed for moderate pain or severe pain. Qty: 10 tablet, Refills: 0      CONTINUE these medications which have NOT CHANGED   Details  acetaminophen (TYLENOL) 325 MG tablet Take 1-2 tablets (325-650 mg total) by mouth every 6 (six) hours as needed for fever, headache, mild pain or moderate pain. Qty: 50 tablet, Refills: 0    alum & mag hydroxide-simeth (MAALOX/MYLANTA) 462-703-50 MG/5ML suspension Take 30 mLs by mouth every 6 (six) hours as needed for indigestion or heartburn (or bloating). Qty: 355 mL, Refills: 0    aspirin 81 MG  tablet Take 81 mg by mouth daily.    levothyroxine (SYNTHROID, LEVOTHROID) 100 MCG tablet TAKE ONE TABLET BY MOUTH 30 MINUTES BEFORE BREAKFAST EVERY MORNING. SEPARATE FROM OTHER MEDICATIONS FOR THYROID. Qty: 60 tablet, Refills: 5    losartan (COZAAR) 50 MG tablet Take one tablet by mouth once daily for blood pressure Qty: 90 tablet, Refills: 1    ondansetron (ZOFRAN) 4 MG tablet Take 1 tablet (4 mg total) by mouth every 8 (eight) hours as needed for nausea or vomiting. Qty: 30 tablet, Refills: 0    OVER THE COUNTER MEDICATION Take 1 Container by mouth 3 (three) times daily. Med pass    polyethylene glycol (MIRALAX / GLYCOLAX) packet Take 17 g by mouth daily.     Probiotic Product (PROBIOTIC DAILY PO) Take 1 tablet by mouth daily.     shark liver oil-cocoa butter (PREPARATION H) 0.25-3-85.5 % suppository Place 1 suppository rectally as needed for hemorrhoids.      STOP taking these medications     ibuprofen (ADVIL,MOTRIN) 400 MG tablet      spironolactone-hydrochlorothiazide (ALDACTAZIDE) 25-25 MG per tablet      estrogens, conjugated, (PREMARIN) 0.3 MG tablet      feeding supplement (BOOST / RESOURCE BREEZE) LIQD      levofloxacin (LEVAQUIN) 500 MG tablet      simvastatin (ZOCOR) 40 MG tablet  No Known Allergies Follow-up Information    Follow up with [name], [name], DO. Schedule an appointment as soon as possible for a visit in 1 week.   Specialty:  Geriatric Medicine   Why:  Hospital follow up   Contact information:   [place] [place] [address] [phone]       Follow up with [facility], MD In 2 weeks.   Specialty:  General Surgery   Contact information:   [address] [place] [address] [phone]        The results of significant diagnostics from this hospitalization (including imaging, microbiology, ancillary and laboratory) are listed below for reference.    Significant Diagnostic Studies: Dg Chest 2  View  07/02/2015  CLINICAL DATA:  Right upper quadrant pain. Cough with swallowing. Leukocytosis. EXAM: CHEST  2 VIEW COMPARISON:  PET-CT 05/17/2015 FINDINGS: Heart at the upper limits of normal in size, atherosclerosis of the thoracic aorta. Linear opacity in the right perihilar lung consistent with atelectasis. No consolidation to suggest pneumonia. Pulmonary vasculature is normal. No consolidation, pleural effusion, or pneumothorax. No acute osseous abnormalities are seen. IMPRESSION: Linear right perihilar atelectasis.  No pneumonia. Electronically Signed   By: Jeb Levering M.D.   On: 07/02/2015 00:47   Ct Head Wo Contrast  06/16/2015  CLINICAL DATA:  Mild facial droop, no other neurological deficits EXAM: CT HEAD WITHOUT CONTRAST TECHNIQUE: Contiguous axial images were obtained from the base of the skull through the vertex without intravenous contrast. COMPARISON:  None. FINDINGS: Diffuse age-related cortical atrophy as well as diffuse low attenuation in the deep white matter. There is no evidence of vascular territory cortical infarct. There is no evidence of mass. There is no hemorrhage or extra-axial fluid. There is no hydrocephalus. Sub cm focus of low attenuation left pons. Sub cm calcified meningioma right frontal region. Calvarium intact. No significant inflammatory change in the visualized portions of the paranasal sinuses. IMPRESSION: Chronic involutional change with no hemorrhage or vascular territory infarct. Tiny focus of low attenuation left pons measuring a few mm may reflect another focus of chronic white matter degeneration versus tiny lacunar infarct of uncertain age. This could be better evaluated with MRI if indicated. Electronically Signed   By: Skipper Cliche M.D.   On: 06/16/2015 08:38   Mr Jeri Cos JS Contrast  06/22/2015  CLINICAL DATA:  Abnormal CT scan of the head. EXAM: MRI HEAD WITHOUT AND WITH CONTRAST TECHNIQUE: Multiplanar, multiecho pulse sequences of the brain and  surrounding structures were obtained without and with intravenous contrast. CONTRAST:  12m MULTIHANCE GADOBENATE DIMEGLUMINE 529 MG/ML IV SOLN COMPARISON:  CT head without contrast FINDINGS: Moderate generalized atrophy and white matter changes are present bilaterally. No acute infarct, hemorrhage, or mass lesion is present. The ventricles are of normal size. The ventricles are proportionate to the degree of atrophy. No significant extra-axial fluid collection is present. Is brainstem and cerebellum are within normal limits. Flow is present in the major intracranial arteries. Bilateral lens replacements are present. The globes and orbits are otherwise intact. A single posterior right ethmoid air cell is opacified. The remaining paranasal sinuses and mastoid air cells are clear. Postcontrast images demonstrate no pathologic enhancement. IMPRESSION: 1. Moderate atrophy and white matter disease. This likely reflects the sequela of chronic microvascular ischemia. 2. No acute infarct. Electronically Signed   By: CSan MorelleM.D.   On: 06/22/2015 19:50   [place]Abscess Drain  07/05/2015  CLINICAL DATA:  81year old female with  a history of poorly differentiated carcinoma of the right liver lobe. Status post resection, has developed what appears to be abscess of the surgical site within the right liver lobe. Differential diagnosis includes biloma and abscess. EXAM: ULTRASOUND GUIDED DRAIN PLACEMENT OF RIGHT LIVER LOBE MEDICATIONS: No sedation. PROCEDURE: The procedure, risks, benefits, and alternatives were explained to the patient. Questions regarding the procedure were encouraged and answered. The patient understands and consents to the procedure. Ultrasound survey of the right upper quadrant was performed. Images were stored and sent to PACs. The right upper quadrant anterior wall was prepped with Betadine in a sterile fashion, and a sterile drape was applied covering the operative field. A sterile gown and  sterile gloves were used for the procedure. Local anesthesia was provided with 1% Lidocaine. Once the patient is prepped and draped in the usual sterile fashion, the skin and subcutaneous tissues were generously infiltrated with 1% lidocaine for local anesthesia. A small stab incision was made with 11 blade scalpel. Trocar technique was used to advance a 10 [place] drain in a transhepatic trajectory into the fluid collection of the rib surgical site and right upper abdomen. Bilious drainage was returned. Sample was sent to the lab. Pigtail catheter was formed and a final image was stored. Patient tolerated the procedure well and remained hemodynamically stable throughout. No complications encountered and no significant blood loss encountered. Drain was sutured into position and left to gravity drainage. COMPLICATIONS: None. FINDINGS: Ultrasound survey demonstrates complex fluid collection within the right upper quadrant with hyperechoic central aspect, which may represent fat necrosis or omental infarct. Bilious fluid was easily aspirated once the drain was in place. IMPRESSION: Status post ultrasound-guided drainage of biloma of the surgical site. Ten [place] drain placed. Signed, Dulcy Fanny. Earleen Newport, DO [facility] Electronically Signed   By: Corrie Mckusick D.O.   On: 07/05/2015 17:59   Ct Abdomen Pelvis W Contrast  07/04/2015  CLINICAL DATA:  Three weeks status post resection of hepatic adenocarcinoma. Nausea. Persistent leukocytosis. EXAM: CT ABDOMEN AND PELVIS WITH CONTRAST TECHNIQUE: Multidetector CT imaging of the abdomen and pelvis was performed using the standard protocol following bolus administration of intravenous contrast. CONTRAST:  142m OMNIPAQUE IOHEXOL 300 MG/ML  SOLN COMPARISON:  05/03/2015 FINDINGS: Lower chest: Small bilateral pleural effusions noted as well as tiny pericardial effusion. Mild wall thickening of distal thoracic esophagus is  suspicious for esophagitis. Hepatobiliary: There has been interval resection of previously seen right hepatic lobe mass. There is an irregular rim enhancing fluid collection within the surgical bed in the right hepatic lobe which measures 8.1 x 8.8 cm on image 32/series 3. This could be due to postop fluid collection or abscess. No other liver lesions are identified. A small rim enhancing perihepatic fluid collection is also seen adjacent to the anterior right hepatic lobe which measures 1.8 x 7.8 cm on image 26/ series 3. This could also represent postop fluid collection or abscess. Prior cholecystectomy again noted. Dilatation of extrahepatic common bile duct remains stable. Pancreas: No mass, inflammatory changes, or other significant abnormality. Spleen: Within normal limits in size and appearance. Adrenals/Urinary Tract: No masses identified. No evidence of hydronephrosis. Mild diffuse bladder wall thickening noted as well as mild dilatation, possibly due to neurogenic bladder. Stomach/Bowel: Diverticulitis near the junction of the descending and sigmoid colon shows improvement since previous study. Vascular/Lymphatic: No pathologically enlarged lymph nodes. No evidence of abdominal aortic aneurysm. Reproductive: Previous hysterectomy a 3.8 cm cyst with a tiny  posterior mural calcification is seen in the left adnexa on image 96 98/series 3 which is not significant changed since previous study. No definite malignant features identified. This could represent a diverticular abscess or cystic lesion of left ovary. Other: Small amount of free fluid in pelvic cul-de-sac. Musculoskeletal: No suspicious bone lesions identified. Spondylosis grade 2 anterolisthesis noted at L4-5. IMPRESSION: 9 cm rim enhancing fluid collection in right hepatic lobe surgical bed, and smaller perihepatic fluid collection adjacent to right lobe. Differential diagnosis includes postop fluid collections and abscess. Improving diverticulitis.  Persistent 3.8 cm left lower quadrant fluid collection, which could represent a diverticular abscess or left ovarian cystic lesion. No malignant features identified. Recommend continued attention on follow-up imaging. Small bilateral pleural effusions, tiny pericardial effusion, and small amount free fluid in pelvic cul-de-sac. Distal esophageal wall thickening, suspicious for esophagitis. Mildly distended urinary bladder with diffuse wall thickening, suspicious for neurogenic bladder. Electronically Signed   By: Earle Gell M.D.   On: 07/04/2015 15:15    Microbiology: Recent Results (from the past 240 hour(s))  Blood Culture (routine x 2)     Status: None   Collection Time: 07/02/15 12:20 AM  Result Value Ref Range Status   Specimen Description BLOOD LEFT ANTECUBITAL  Final   Special Requests BOTTLES DRAWN AEROBIC AND ANAEROBIC 5ML  Final   Culture   Final    NO GROWTH 5 DAYS Performed at [facility]    Report Status 07/07/2015 FINAL  Final  Blood Culture (routine x 2)     Status: None   Collection Time: 07/02/15 12:27 AM  Result Value Ref Range Status   Specimen Description BLOOD LEFT HAND  Final   Special Requests IN PEDIATRIC BOTTLE 5CC  Final   Culture   Final    NO GROWTH 5 DAYS Performed at [facility]    Report Status 07/07/2015 FINAL  Final  Urine culture     Status: None   Collection Time: 07/02/15  1:41 AM  Result Value Ref Range Status   Specimen Description URINE, CLEAN CATCH  Final   Special Requests NONE  Final   Culture   Final    >=100,000 COLONIES/mL ESCHERICHIA COLI Performed at [facility]    Report Status 07/04/2015 FINAL  Final   Organism ID, Bacteria ESCHERICHIA COLI  Final      Susceptibility   Escherichia coli - MIC*    AMPICILLIN >=32 RESISTANT Resistant     CEFAZOLIN <=4 SENSITIVE Sensitive     CEFTRIAXONE <=1 SENSITIVE Sensitive     CIPROFLOXACIN >=4 RESISTANT Resistant     GENTAMICIN <=1 SENSITIVE Sensitive     IMIPENEM  0.5 SENSITIVE Sensitive     NITROFURANTOIN <=16 SENSITIVE Sensitive     TRIMETH/SULFA <=20 SENSITIVE Sensitive     AMPICILLIN/SULBACTAM 16 INTERMEDIATE Intermediate     PIP/TAZO 8 SENSITIVE Sensitive     * >=100,000 COLONIES/mL ESCHERICHIA COLI  Culture, routine-abscess     Status: None   Collection Time: 07/05/15  3:14 PM  Result Value Ref Range Status   Specimen Description ABSCESS ABDOMEN RIGHT  Final   Special Requests NONE  Final   Gram Stain   Final    RARE WBC PRESENT, PREDOMINANTLY PMN NO SQUAMOUS EPITHELIAL CELLS SEEN NO ORGANISMS SEEN Performed at [facility]    Culture   Final    NO GROWTH 3 DAYS Performed at [facility]    Report Status 07/09/2015 FINAL  Final  Labs: Basic Metabolic Panel:  Recent Labs Lab 07/05/15 0415 07/06/15 0428 07/07/15 0530 07/08/15 0425 07/09/15 0500  NA 135 139 138 136 132*  K 3.5 3.2* 3.3* 3.7 3.6  CL 106 110 108 105 101  CO2 '23 24 26 25 26  '$ GLUCOSE 112* 109* 136* 128* 130*  BUN '9 8 8 9 9  '$ CREATININE 0.74 0.87 0.74 0.88 0.85  CALCIUM 9.0 8.7* 8.6* 8.7* 8.6*  MG  --   --  1.5* 1.9  --   PHOS  --   --  1.8* 2.4* 2.6   Liver Function Tests:  Recent Labs Lab 07/07/15 0530  AST 14*  ALT 13*  ALKPHOS 66  BILITOT 0.5  PROT 5.1*  ALBUMIN 2.0*   No results for input(s): LIPASE, AMYLASE in the last 168 hours. No results for input(s): AMMONIA in the last 168 hours. CBC:  Recent Labs Lab 07/05/15 0415 07/06/15 0428 07/07/15 0530 07/09/15 0500  WBC 14.5* 13.3* 12.6* 11.1*  NEUTROABS  --   --  9.0*  --   HGB 11.0* 10.7* 9.5* 10.1*  HCT 33.5* 32.6* 28.7* 30.7*  MCV 85.9 86.0 84.2 84.3  PLT 302 269 219 215   Cardiac Enzymes: No results for input(s): CKTOTAL, CKMB, CKMBINDEX, TROPONINI in the last 168 hours. BNP: BNP (last 3 results) No results for input(s): BNP in the last 8760 hours.  ProBNP (last 3 results) No results for input(s): PROBNP in the last 8760 hours.  CBG:  Recent Labs Lab  07/08/15 1236 07/08/15 1824 07/09/15 0002 07/09/15 0628 07/09/15 1150  GLUCAP 133* 123* 139* 120* 159*       Signed:  Timaya Bojarski A  [facility] 07/11/2015, 9:51 AM

## 2015-07-11 NOTE — Progress Notes (Signed)
Report given to Jory Ee at [facility].

## 2015-07-11 NOTE — Progress Notes (Signed)
CSW continuing to follow.   Pt medically ready for discharge today. CSW followed up with [facility] as [facility] could not accept pt back. [facility] stated that facility does not have bed available today and pt medically ready for discharge today.  CSW met with pt daughter in family room to discuss. CSW discussed with pt daughter that [facility] and [facility] do not have availability.   Pt daughter spoke with RNCM about hiring private duty care, but pt daughter does not feel that home can be managed at this time.   CSW reviewed additional bed offers with pt daughter. Pt daughter chooses [facility] and CSW confirmed with [facility] that facility has bed available today. Pt and pt daughter agreeable to transfer to [facility] today.   CSW to facilitate discharge needs this afternoon.  Alison Murray, MSW, [place] [phone]

## 2015-07-11 NOTE — Clinical Social Work Placement (Signed)
   CLINICAL SOCIAL WORK PLACEMENT  NOTE  Date:  07/11/2015  Patient Details  Name: Linley Moskal MRN: [id number] Date of Birth: 05/30/1926  Clinical Social Work is seeking post-discharge placement for this patient at the [place] level of care (*CSW will initial, date and re-position this form in  chart as items are completed):  No   Patient/family provided with [facility] Work Department's list of facilities offering this level of care within the geographic area requested by the patient (or if unable, by the patient's family).  Yes   Patient/family informed of their freedom to choose among providers that offer the needed level of care, that participate in Medicare, Medicaid or managed care program needed by the patient, have an available bed and are willing to accept the patient.  No   Patient/family informed of [facility]'s ownership interest in [facility] and [facility], as well as of the fact that they are under no obligation to receive care at these facilities.  PASRR submitted to EDS on       PASRR number received on       Existing PASRR number confirmed on 07/02/15     FL2 transmitted to all facilities in geographic area requested by pt/family on 07/02/15     FL2 transmitted to all facilities within larger geographic area on       Patient informed that his/her managed care company has contracts with or will negotiate with certain facilities, including the following:        Yes   Patient/family informed of bed offers received.  Patient chooses bed at [facility], [place]     Physician recommends and patient chooses bed at      Patient to be transferred to [facility] on 07/11/15.  Patient to be transferred to facility by pt daughter via private vehicle      Patient family notified on 07/11/15 of transfer.  Name of family member notified:  pt and pt daughter, Bev notified at bedside     PHYSICIAN        Additional Comment:    _______________________________________________ Ladell Pier, LCSW 07/11/2015, 11:14 AM

## 2015-07-17 DIAGNOSIS — Z9089 Acquired absence of other organs: Secondary | ICD-10-CM | POA: Diagnosis not present

## 2015-07-17 DIAGNOSIS — R197 Diarrhea, unspecified: Secondary | ICD-10-CM | POA: Diagnosis not present

## 2015-07-17 DIAGNOSIS — E440 Moderate protein-calorie malnutrition: Secondary | ICD-10-CM | POA: Diagnosis not present

## 2015-07-17 DIAGNOSIS — F418 Other specified anxiety disorders: Secondary | ICD-10-CM | POA: Diagnosis not present

## 2015-07-17 DIAGNOSIS — C228 Malignant neoplasm of liver, primary, unspecified as to type: Secondary | ICD-10-CM | POA: Diagnosis not present

## 2015-07-17 DIAGNOSIS — R2689 Other abnormalities of gait and mobility: Secondary | ICD-10-CM | POA: Diagnosis not present

## 2015-07-17 DIAGNOSIS — N179 Acute kidney failure, unspecified: Secondary | ICD-10-CM | POA: Diagnosis not present

## 2015-07-17 DIAGNOSIS — N10 Acute tubulo-interstitial nephritis: Secondary | ICD-10-CM | POA: Diagnosis not present

## 2015-07-18 ENCOUNTER — Ambulatory Visit (INDEPENDENT_AMBULATORY_CARE_PROVIDER_SITE_OTHER): Payer: Medicare Other | Admitting: Internal Medicine

## 2015-07-18 ENCOUNTER — Encounter: Payer: Self-pay | Admitting: Internal Medicine

## 2015-07-18 VITALS — BP 164/84 | HR 93 | Temp 98.1°F | Wt 141.0 lb

## 2015-07-18 DIAGNOSIS — T8189XD Other complications of procedures, not elsewhere classified, subsequent encounter: Secondary | ICD-10-CM | POA: Diagnosis not present

## 2015-07-18 DIAGNOSIS — Z23 Encounter for immunization: Secondary | ICD-10-CM

## 2015-07-18 DIAGNOSIS — N12 Tubulo-interstitial nephritis, not specified as acute or chronic: Secondary | ICD-10-CM | POA: Diagnosis not present

## 2015-07-18 DIAGNOSIS — C229 Malignant neoplasm of liver, not specified as primary or secondary: Secondary | ICD-10-CM | POA: Diagnosis not present

## 2015-07-18 DIAGNOSIS — N179 Acute kidney failure, unspecified: Secondary | ICD-10-CM

## 2015-07-18 DIAGNOSIS — E785 Hyperlipidemia, unspecified: Secondary | ICD-10-CM | POA: Diagnosis not present

## 2015-07-18 DIAGNOSIS — K668 Other specified disorders of peritoneum: Secondary | ICD-10-CM | POA: Diagnosis not present

## 2015-07-18 DIAGNOSIS — F05 Delirium due to known physiological condition: Secondary | ICD-10-CM | POA: Diagnosis not present

## 2015-07-18 DIAGNOSIS — E039 Hypothyroidism, unspecified: Secondary | ICD-10-CM | POA: Diagnosis not present

## 2015-07-18 DIAGNOSIS — N189 Chronic kidney disease, unspecified: Secondary | ICD-10-CM | POA: Diagnosis not present

## 2015-07-18 DIAGNOSIS — E871 Hypo-osmolality and hyponatremia: Secondary | ICD-10-CM

## 2015-07-18 DIAGNOSIS — R410 Disorientation, unspecified: Secondary | ICD-10-CM

## 2015-07-18 NOTE — Progress Notes (Signed)
Patient ID: Destiny Velasquez, female   DOB: [birth year], 81 y.o.   MRN: [id number]   Location: [facility] Provider: Rexene Edison. Mariea Clonts, D.O., C.M.D.  Code Status: DNR Goals of Care: Advanced Directive information Does patient have an advance directive?: Yes, Type of Advance Directive: Healthcare Power of Attorney  Chief Complaint  Patient presents with  . Hospitalization Follow-up    was in hospital from 07/01/15 to 07/11/15 for Pyelonephritis. Here with daughter [name]    HPI: Patient is a 81 y.o. female seen in the office today for a hospital f/u--she was admitted the first time from 11/1 to 06/23/15 for partial right hepatic lobectomy 81/1/91 which was complicated by constipation, UTI, and subacute delirium.  See more details below.  She then was hospitalized 11/18-28 with pyelonephritis.   Feels poorly today. Lower abdomen is painful.   Her daughter is sick with possible walking pneumonia here with her at appt.    Pt had her liver surgery on 06/14/15.  She was not doing well mentally postop.  She had a CT and MRI and had been in ICU for 9 days.  Then went to [facility] home.  Was started on abx there with levaquin for UTI Between levaquin, pain meds, ativan, she became a mess  then got pyelonephritis--had acute renal failure Ecoli was cause Had slight abdominal pain Repeat CT showed pocket of fluid behind liver--was causing belching and spitting up--had drain put in Lost up to 10% of body weight and had portocath placed for nourishment--she had TPN Was having to get pills crushed in applesauce to swallow them  She is now at [facility] for rehab again. Had some redness around wound site last night Dr. Sharlett Iles saw her and he thought a small infection was developing around the site.  He did not start her on medication for it just yet.    Has f/u scan on wed at [facility] Dr. Barry Dienes next week.    Review of Systems:  Review of Systems  Constitutional: Positive for weight loss and  malaise/fatigue. Negative for fever and chills.  HENT: Negative for congestion.   Eyes: Negative for blurred vision.  Respiratory: Negative for shortness of breath.   Cardiovascular: Negative for chest pain and palpitations.  Gastrointestinal: Positive for abdominal pain. Negative for heartburn, nausea, vomiting, constipation, blood in stool and melena.  Genitourinary: Negative for dysuria, urgency, frequency and hematuria.  Musculoskeletal: Negative for falls.  Skin: Negative for rash.  Neurological: Positive for weakness. Negative for dizziness, loss of consciousness and headaches.  Endo/Heme/Allergies: Bruises/bleeds easily.  Psychiatric/Behavioral: Positive for memory loss. Negative for depression. The patient is nervous/anxious. The patient does not have insomnia.     Past Medical History  Diagnosis Date  . Hypertension   . Hyperlipidemia   . Thyroid disease   . Subacute delirium   . Essential and other specified forms of tremor   . Unspecified hypothyroidism   . Diverticulosis of colon (without mention of hemorrhage)   . Unspecified constipation   . Anal and rectal polyp   . Disorder of bone and cartilage, unspecified   . Pneumonia X 1  . GERD (gastroesophageal reflux disease)   . Malignant neoplasm of breast (female), unspecified site   . Breast cancer, right breast ([place]) 1971    "never had chemo or radiation"  . Liver cancer ([place]) 2016  . Diverticulitis of large intestine without perforation or abscess without bleeding     Past Surgical History  Procedure Laterality Date  . Appendectomy    .  Abdominal hysterectomy    . Tonsillectomy and adenoidectomy  1935  . Cholecystectomy    . Breast biopsy Right 1971  . Mastectomy Right 1971  . Tubal ligation    . Cataract extraction w/ intraocular lens  implant, bilateral Bilateral   . Rectal polypectomy  early 2000's  . Laparoscopy N/A 06/14/2015    Procedure: LAPAROSCOPY DIAGNOSTIC;  Surgeon: [name], MD;  Location:  [facility];  Service: General;  Laterality: N/A;  . Open partial hepatectomy [83] N/A 06/14/2015    Procedure:  OPEN PARTIAL RIGHT HEPATECTOMY;  Surgeon: [name], MD;  Location: [facility];  Service: General;  Laterality: N/A;  converted to open @ 1447  . Liver biopsy  06/14/2015    Procedure: LIVER BIOPSY;  Surgeon: [name], MD;  Location: [facility];  Service: General;;    No Known Allergies    Medication List       This list is accurate as of: 07/18/15  9:01 AM.  Always use your most recent med list.               acetaminophen 325 MG tablet  Commonly known as:  TYLENOL  Take 1-2 tablets (325-650 mg total) by mouth every 6 (six) hours as needed for fever, headache, mild pain or moderate pain.     alum & mag hydroxide-simeth 200-200-20 MG/5ML suspension  Commonly known as:  MAALOX/MYLANTA  Take 30 mLs by mouth every 6 (six) hours as needed for indigestion or heartburn (or bloating).     amoxicillin-clavulanate 250-62.5 MG/5ML suspension  Commonly known as:  AUGMENTIN  Take 17.5 mLs (875 mg total) by mouth 2 (two) times daily.     aspirin 81 MG tablet  Take 81 mg by mouth daily.     doxycycline 100 MG EC tablet  Commonly known as:  DORYX  Take 100 mg by mouth. Take one tablet for 7 days     HYDROcodone-acetaminophen 5-325 MG tablet  Commonly known as:  NORCO/VICODIN  Take 1 tablet by mouth every 6 (six) hours as needed for moderate pain.     levothyroxine 100 MCG tablet  Commonly known as:  SYNTHROID, LEVOTHROID  TAKE ONE TABLET BY MOUTH 30 MINUTES BEFORE BREAKFAST EVERY MORNING. SEPARATE FROM OTHER MEDICATIONS FOR THYROID.     LORazepam 0.5 MG tablet  Commonly known as:  ATIVAN  Take 1 tablet (0.5 mg total) by mouth every 6 (six) hours as needed for anxiety.     losartan 50 MG tablet  Commonly known as:  COZAAR  Take one tablet by mouth once daily for blood pressure     ondansetron 4 MG tablet  Commonly known as:  ZOFRAN  Take 1 tablet (4 mg total) by mouth every  8 (eight) hours as needed for nausea or vomiting.     OVER THE COUNTER MEDICATION  Take 1 Container by mouth 3 (three) times daily. Med pass     pantoprazole 40 MG tablet  Commonly known as:  PROTONIX  Take 1 tablet (40 mg total) by mouth daily.     polyethylene glycol packet  Commonly known as:  MIRALAX / GLYCOLAX  Take 17 g by mouth daily.     PROBIOTIC DAILY PO  Take 1 tablet by mouth daily.     shark liver oil-cocoa butter 0.25-3-85.5 % suppository  Commonly known as:  PREPARATION H  Place 1 suppository rectally as needed for hemorrhoids.     simethicone 125 MG chewable tablet  Commonly known as:  MYLICON  Chew 1 tablet (125 mg total) by mouth 3 (three) times daily before meals.     traMADol 50 MG tablet  Commonly known as:  ULTRAM  Take 1 tablet (50 mg total) by mouth every 6 (six) hours as needed for moderate pain or severe pain.        Health Maintenance  Topic Date Due  . FOOT EXAM  03/13/1936  . OPHTHALMOLOGY EXAM  [birth year]13/1938  . URINE MICROALBUMIN  03/13/1938  . TETANUS/TDAP  03/14/1947  . ZOSTAVAX  03/13/1988  . INFLUENZA VACCINE  03/14/2015  . HEMOGLOBIN A1C  09/30/2015  . DEXA SCAN  Completed  . PNA vac Low Risk Adult  Completed    Physical Exam: Filed Vitals:   07/18/15 0854  Pulse: 93  Temp: 98.1 F (36.7 C)  TempSrc: Oral  Weight: 141 lb (63.957 kg)  SpO2: 99%   Body mass index is 24.98 kg/(m^2). Physical Exam  Constitutional: No distress.  Dusky skin tone  Cardiovascular: Normal rate, regular rhythm, normal heart sounds and intact distal pulses.   Pulmonary/Chest: Effort normal and breath sounds normal. No respiratory distress. She has no rales.  Abdominal: Soft. Bowel sounds are normal. She exhibits no distension and no mass. There is tenderness. There is no rebound and no guarding.  Tenderness of lower abdomen; drain in place--erythema around the insertion site only, no discharge or foul odor  Musculoskeletal:  Slow but normal gait with  rolling walker  Neurological: She is alert.  Oriented to person and place, not time; is confused today from baseline--unable to recall many of recent events   Psychiatric:  Flat affect; has lacked insight ongoing for a couple of years, but memory loss has worsened    Labs reviewed: Basic Metabolic Panel:  Recent Labs  09/20/14 0816 03/30/15 0813  06/15/15 0510  07/07/15 0530 07/08/15 0425 07/09/15 0500  NA 139 139  < > 134*  < > 138 136 132*  K 4.2 3.9  < > 4.6  < > 3.3* 3.7 3.6  CL 100 98  < > 103  < > 108 105 101  CO2 23 22  < > 24  < > '26 25 26  '$ GLUCOSE 132* 119*  < > 258*  < > 136* 128* 130*  BUN 18 18  < > 19  < > '8 9 9  '$ CREATININE 1.23* 1.24*  < > 1.54*  < > 0.74 0.88 0.85  CALCIUM 10.8* 9.8  < > 9.0  < > 8.6* 8.7* 8.6*  MG  --   --   --  1.5*  --  1.5* 1.9  --   PHOS  --   --   < > 3.7  --  1.8* 2.4* 2.6  TSH 1.100 1.640  --   --   --   --   --   --   < > = values in this interval not displayed. Liver Function Tests:  Recent Labs  06/23/15 0850 07/02/15 0026 07/07/15 0530  AST 22 24 14*  ALT 45 17 13*  ALKPHOS 90 84 66  BILITOT 1.1 0.6 0.5  PROT 6.6 6.9 5.1*  ALBUMIN 3.1* 3.0* 2.0*    Recent Labs  06/18/15 1126  LIPASE 27    Recent Labs  06/18/15 1126 06/19/15 0410  AMMONIA 19 20   CBC:  Recent Labs  06/13/15 0830  07/02/15 0026  07/06/15 0428 07/07/15 0530 07/09/15 0500  WBC 10.9*  < > 14.5*  < >  13.3* 12.6* 11.1*  NEUTROABS 8.4*  --  10.8*  --   --  9.0*  --   HGB 14.1  < > 11.4*  < > 10.7* 9.5* 10.1*  HCT 41.9  < > 33.9*  < > 32.6* 28.7* 30.7*  MCV 86.7  < > 84.5  < > 86.0 84.2 84.3  PLT 216  < > 386  < > 269 219 215  < > = values in this interval not displayed. Lipid Panel:  Recent Labs  09/20/14 0816 03/30/15 0813 07/07/15 0545  CHOL 174 264*  --   HDL 73 76  --   LDLCALC 77 154*  --   TRIG 118 171* 83  CHOLHDL 2.4 3.5  --    Lab Results  Component Value Date   HGBA1C 6.6* 03/30/2015   CT head w/o contrast 06/16/15:   Chronic involutional change with no hemorrhage or vascular territory infarct. Tiny focus of low attenuation left pons measuring a few mm may reflect another focus of chronic white matter degeneration versus tiny lacunar infarct of uncertain age. This could be better evaluated with MRI if indicated.  MRI brain 06/22/15:  1. Moderate atrophy and white matter disease. This likely reflects the sequela of chronic microvascular ischemia. 2. No acute infarct.  Dg Chest 2 View 07/02/2015  CLINICAL DATA:  Right upper quadrant pain. Cough with swallowing. Leukocytosis. EXAM: CHEST  2 VIEW COMPARISON:  PET-CT 05/17/2015 FINDINGS: Heart at the upper limits of normal in size, atherosclerosis of the thoracic aorta. Linear opacity in the right perihilar lung consistent with atelectasis. No consolidation to suggest pneumonia. Pulmonary vasculature is normal. No consolidation, pleural effusion, or pneumothorax. No acute osseous abnormalities are seen. IMPRESSION: Linear right perihilar atelectasis.  No pneumonia. Electronically Signed   By: Jeb Levering M.D.   On: 07/02/2015 00:47   CT abdomen/pelvis w/ contrast 07/04/15:  9 cm rim enhancing fluid collection in right hepatic lobe surgical bed, and smaller perihepatic fluid collection adjacent to right lobe. Differential diagnosis includes postop fluid collections and Abscess. Improving diverticulitis. Persistent 3.8 cm left lower quadrant fluid collection, which could represent a diverticular abscess or left ovarian cystic lesion. No malignant features identified. Recommend continued attention on follow-up imaging. Small bilateral pleural effusions, tiny pericardial effusion, and small amount free fluid in pelvic cul-de-sac.Distal esophageal wall thickening, suspicious for esophagitis.Mildly distended urinary bladder with diffuse wall thickening,suspicious for neurogenic bladder.  [place] abscess drain 07/05/15:  Status post ultrasound-guided drainage of biloma of the  surgical site. Ten [place] drain placed.  Assessment/Plan 1. Adenocarcinoma of liver s/p partial right hepatic lobectomy 06/14/2015 - surgery itself seems was uncomplicated at the time, but patient has just generally had other problems since -f/u labs today: - CBC with Differential/Platelet - Comprehensive metabolic panel  2. Pyelonephritis - completed abx course for this -still has lower abdominal pain, but seems this is more related to the biloma - CBC with Differential/Platelet - Comprehensive metabolic panel  3. Acute-on-chronic kidney injury ([place]) - f/u labs--had improved while on tpn via [facility], but that was discontinued and her intake is poor--continues to lose weight--141 today but wearing several layers this am - CBC with Differential/Platelet - Comprehensive metabolic panel  4. Biloma following surgery, subsequent encounter -has drain in RUQ for this--I don't know how much drainage she's had--not in snf documentation that was sent to visit -has f/u imaging and appt with surgery coming up as above - CBC with Differential/Platelet - Comprehensive metabolic panel  5. Hyperlipidemia -is  off statin therapy at this time--not something I'm particularly concerned about with her current poor condition and failure to thrive state  6. Hyponatremia -f/u labs: - CBC with Differential/Platelet - Comprehensive metabolic panel  7. Subacute delirium -she remains more confused with pain meds, pain, treated abscess and now treatment of cellulitis at drainage site in RUQ with doxycycline  8. Hypothyroidism, unspecified hypothyroidism type -cont current synthroid, but check tsh today - TSH  9. Encounter for immunization -she got her flu shot today  Labs/tests ordered:   Orders Placed This Encounter  Procedures  . Flu Vaccine QUAD 36+ mos IM  . CBC with Differential/Platelet  . Comprehensive metabolic panel  . TSH    Next appt:  Return when leaves rehab  [place]. Jetson Pickrel,  D.O. [facility] [address], [place] [address] Cell Phone (Mon-Fri 8am-5pm):  [phone] On Call:  [phone] & follow prompts after 5pm & weekends Office Phone:  [phone] Office Fax:  [phone]

## 2015-07-18 NOTE — Patient Instructions (Signed)
Keep on working with therapy!   Best of luck to you!

## 2015-07-19 LAB — CBC WITH DIFFERENTIAL/PLATELET
Basophils Absolute: 0 10*3/uL (ref 0.0–0.2)
Basos: 0 %
EOS (ABSOLUTE): 0.3 10*3/uL (ref 0.0–0.4)
Eos: 4 %
Hematocrit: 33.3 % — ABNORMAL LOW (ref 34.0–46.6)
Hemoglobin: 10.9 g/dL — ABNORMAL LOW (ref 11.1–15.9)
Immature Grans (Abs): 0 10*3/uL (ref 0.0–0.1)
Immature Granulocytes: 0 %
Lymphocytes Absolute: 1.4 10*3/uL (ref 0.7–3.1)
Lymphs: 15 %
MCH: 27.7 pg (ref 26.6–33.0)
MCHC: 32.7 g/dL (ref 31.5–35.7)
MCV: 85 fL (ref 79–97)
Monocytes Absolute: 0.6 10*3/uL (ref 0.1–0.9)
Monocytes: 6 %
Neutrophils Absolute: 6.6 10*3/uL (ref 1.4–7.0)
Neutrophils: 75 %
Platelets: 306 10*3/uL (ref 150–379)
RBC: 3.94 x10E6/uL (ref 3.77–5.28)
RDW: 14.1 % (ref 12.3–15.4)
WBC: 8.9 10*3/uL (ref 3.4–10.8)

## 2015-07-19 LAB — COMPREHENSIVE METABOLIC PANEL
ALT: 11 IU/L (ref 0–32)
AST: 21 IU/L (ref 0–40)
Albumin/Globulin Ratio: 0.8 — ABNORMAL LOW (ref 1.1–2.5)
Albumin: 2.9 g/dL — ABNORMAL LOW (ref 3.5–4.7)
Alkaline Phosphatase: 92 IU/L (ref 39–117)
BUN/Creatinine Ratio: 7 — ABNORMAL LOW (ref 11–26)
BUN: 6 mg/dL — ABNORMAL LOW (ref 8–27)
Bilirubin Total: 0.3 mg/dL (ref 0.0–1.2)
CO2: 25 mmol/L (ref 18–29)
Calcium: 9.9 mg/dL (ref 8.7–10.3)
Chloride: 98 mmol/L (ref 97–106)
Creatinine, Ser: 0.92 mg/dL (ref 0.57–1.00)
GFR calc Af Amer: 65 mL/min/{1.73_m2} (ref >59)
GFR calc non Af Amer: 56 mL/min/{1.73_m2} — ABNORMAL LOW (ref >59)
Globulin, Total: 3.5 g/dL (ref 1.5–4.5)
Glucose: 132 mg/dL — ABNORMAL HIGH (ref 65–99)
Potassium: 3.5 mmol/L (ref 3.5–5.2)
Sodium: 138 mmol/L (ref 136–144)
Total Protein: 6.4 g/dL (ref 6.0–8.5)

## 2015-07-19 LAB — TSH: TSH: 1.41 u[IU]/mL (ref 0.450–4.500)

## 2015-07-20 ENCOUNTER — Ambulatory Visit
Admission: RE | Admit: 2015-07-20 | Discharge: 2015-07-20 | Disposition: A | Discharge status: Home / Self Care | Payer: Medicare Other | Source: Ambulatory Visit | Attending: Radiology | Admitting: Radiology

## 2015-07-20 ENCOUNTER — Ambulatory Visit
Admission: RE | Admit: 2015-07-20 | Discharge: 2015-07-20 | Disposition: A | Discharge status: Home / Self Care | Payer: Medicare Other | Source: Ambulatory Visit | Attending: General Surgery | Admitting: General Surgery

## 2015-07-20 ENCOUNTER — Other Ambulatory Visit: Payer: Self-pay | Admitting: General Surgery

## 2015-07-20 ENCOUNTER — Other Ambulatory Visit (HOSPITAL_COMMUNITY): Payer: Self-pay | Admitting: Interventional Radiology

## 2015-07-20 DIAGNOSIS — K9189 Other postprocedural complications and disorders of digestive system: Secondary | ICD-10-CM | POA: Diagnosis not present

## 2015-07-20 DIAGNOSIS — T8189XD Other complications of procedures, not elsewhere classified, subsequent encounter: Principal | ICD-10-CM

## 2015-07-20 DIAGNOSIS — K668 Other specified disorders of peritoneum: Secondary | ICD-10-CM

## 2015-07-20 DIAGNOSIS — T8189XA Other complications of procedures, not elsewhere classified, initial encounter: Secondary | ICD-10-CM

## 2015-07-20 MED ORDER — IOPAMIDOL (ISOVUE-300) INJECTION 61%
100.0000 mL | Freq: Once | INTRAVENOUS | Status: AC | PRN
  Administered 2015-07-20: 100 mL via INTRAVENOUS

## 2015-07-20 NOTE — Progress Notes (Signed)
Chief Complaint: Status post partial right hepatectomy for removal of a hepatic adenocarcinoma with percutaneous drainage of postoperative biloma on 07/05/2015.  History of Present Illness: Destiny Velasquez is a 81 y.o. female status post partial right hepatectomy to remove a posterior right lobe adenocarcinoma on 06/14/2015 by Dr. Barry Dienes. Postoperatively, she developed a biloma adjacent to the liver resection margin which was drained on 07/05/2015 with placement of a 10 French drainage catheter. After discharge from the hospital, Destiny Velasquez has been at a skilled nursing facility. She states that the catheter has barely been flushed. There is still return of fluid from the catheter but volume does not appear to be recorded.  She was placed on an oral antibiotic yesterday due to erythema at the catheter exit site with suspected infection. She denies any pain at the catheter exit site or within the abdomen.  Past Medical History  Diagnosis Date  . Hypertension   . Hyperlipidemia   . Thyroid disease   . Subacute delirium   . Essential and other specified forms of tremor   . Unspecified hypothyroidism   . Diverticulosis of colon (without mention of hemorrhage)   . Unspecified constipation   . Anal and rectal polyp   . Disorder of bone and cartilage, unspecified   . Pneumonia X 1  . GERD (gastroesophageal reflux disease)   . Malignant neoplasm of breast (female), unspecified site   . Breast cancer, right breast ([place]) 1971    "never had chemo or radiation"  . Liver cancer ([place]) 2016  . Diverticulitis of large intestine without perforation or abscess without bleeding     Past Surgical History  Procedure Laterality Date  . Appendectomy    . Abdominal hysterectomy    . Tonsillectomy and adenoidectomy  1935  . Cholecystectomy    . Breast biopsy Right 1971  . Mastectomy Right 1971  . Tubal ligation    . Cataract extraction w/ intraocular lens  implant, bilateral Bilateral   . Rectal  polypectomy  early 2000's  . Laparoscopy N/A 06/14/2015    Procedure: LAPAROSCOPY DIAGNOSTIC;  Surgeon: [name], MD;  Location: [facility];  Service: General;  Laterality: N/A;  . Open partial hepatectomy [83] N/A 06/14/2015    Procedure:  OPEN PARTIAL RIGHT HEPATECTOMY;  Surgeon: [name], MD;  Location: [facility];  Service: General;  Laterality: N/A;  converted to open @ 1447  . Liver biopsy  06/14/2015    Procedure: LIVER BIOPSY;  Surgeon: [name], MD;  Location: [facility];  Service: General;;    Allergies: Review of patient's allergies indicates no known allergies.  Medications: Prior to Admission medications   Medication Sig Start Date End Date Taking? Authorizing Provider  acetaminophen (TYLENOL) 325 MG tablet Take 1-2 tablets (325-650 mg total) by mouth every 6 (six) hours as needed for fever, headache, mild pain or moderate pain. Patient taking differently: Take 325 mg by mouth every 6 (six) hours as needed for fever, headache, mild pain or moderate pain.  06/22/15   [name], MD  alum & mag hydroxide-simeth (MAALOX/MYLANTA) 200-200-20 MG/5ML suspension Take 30 mLs by mouth every 6 (six) hours as needed for indigestion or heartburn (or bloating). 06/22/15   [name], MD  amoxicillin-clavulanate (AUGMENTIN) 250-62.5 MG/5ML suspension Take 17.5 mLs (875 mg total) by mouth 2 (two) times daily. 07/11/15   Verlee Monte, MD  aspirin 81 MG tablet Take 81 mg by mouth daily.    Historical Provider, MD  doxycycline (DORYX) 100 MG EC  tablet Take 100 mg by mouth. Take one tablet for 7 days    Historical Provider, MD  HYDROcodone-acetaminophen (NORCO/VICODIN) 5-325 MG tablet Take 1 tablet by mouth every 6 (six) hours as needed for moderate pain. 07/11/15   Verlee Monte, MD  levothyroxine (SYNTHROID, LEVOTHROID) 100 MCG tablet TAKE ONE TABLET BY MOUTH 30 MINUTES BEFORE BREAKFAST EVERY MORNING. SEPARATE FROM OTHER MEDICATIONS FOR THYROID. 12/20/14   [name] L [name], DO  LORazepam (ATIVAN) 0.5 MG  tablet Take 1 tablet (0.5 mg total) by mouth every 6 (six) hours as needed for anxiety. Patient taking differently: Take 0.5 mg by mouth every 4 (four) hours as needed for anxiety. Take one tablet at bedtime 07/11/15   Verlee Monte, MD  losartan (COZAAR) 50 MG tablet Take one tablet by mouth once daily for blood pressure Patient taking differently: Take 50 mg by mouth daily. Take one tablet by mouth once daily for blood pressure 02/28/15   [name] L [name], DO  ondansetron (ZOFRAN) 4 MG tablet Take 1 tablet (4 mg total) by mouth every 8 (eight) hours as needed for nausea or vomiting. 05/09/15   [name] L [name], DO  OVER THE COUNTER MEDICATION Take 1 Container by mouth 3 (three) times daily. Med pass    Historical Provider, MD  pantoprazole (PROTONIX) 40 MG tablet Take 1 tablet (40 mg total) by mouth daily. 07/11/15   Verlee Monte, MD  polyethylene glycol (MIRALAX / GLYCOLAX) packet Take 17 g by mouth daily.     Historical Provider, MD  Probiotic Product (PROBIOTIC DAILY PO) Take 1 tablet by mouth daily.     Historical Provider, MD  shark liver oil-cocoa butter (PREPARATION H) 0.25-3-85.5 % suppository Place 1 suppository rectally as needed for hemorrhoids.    Historical Provider, MD  simethicone (MYLICON) 419 MG chewable tablet Chew 1 tablet (125 mg total) by mouth 3 (three) times daily before meals. 07/11/15   Verlee Monte, MD  traMADol (ULTRAM) 50 MG tablet Take 1 tablet (50 mg total) by mouth every 6 (six) hours as needed for moderate pain or severe pain. 07/11/15   Verlee Monte, MD     Family History  Problem Relation Age of Onset  . Cancer Mother   . Hypertension Father     Social History   Social History  . Marital Status: Married    Spouse Name: N/A  . Number of Children: N/A  . Years of Education: N/A   Social History Main Topics  . Smoking status: Former Smoker -- 0.50 packs/day for 40 years    Types: Cigarettes    Quit date: 08/13/2012  . Smokeless tobacco: Never Used      Comment: "quit smoking cigarettes years ago; I was probably in my 40's"  . Alcohol Use: 3.0 oz/week    5 Glasses of wine per week     Comment: once per month   . Drug Use: No  . Sexual Activity: No   Other Topics Concern  . Not on file   Social History Narrative    Review of Systems: A 12 point ROS discussed and pertinent positives are indicated in the HPI above.  All other systems are negative.  Review of Systems  Constitutional: Negative.   Respiratory: Negative.   Cardiovascular: Negative.   Gastrointestinal: Negative.   Genitourinary: Negative.   Musculoskeletal: Negative.     Vital Signs: BP 150/60 mmHg  Pulse 84  Temp(Src) 99 F (37.2 C) (Oral)  SpO2 92%  Physical Exam  Constitutional: No distress.  Abdominal: Soft. She exhibits no distension. There is no tenderness. There is no rebound and no guarding.  Right upper quadrant catheter exit site: Mild erythema immediately surrounding the catheter exit site without evidence of tenderness, fluctuance or discharge.  Skin: She is not diaphoretic.  Nursing note and vitals reviewed.   Mallampati Score:     Imaging: Dg Chest 2 View  07/02/2015  CLINICAL DATA:  Right upper quadrant pain. Cough with swallowing. Leukocytosis. EXAM: CHEST  2 VIEW COMPARISON:  PET-CT 05/17/2015 FINDINGS: Heart at the upper limits of normal in size, atherosclerosis of the thoracic aorta. Linear opacity in the right perihilar lung consistent with atelectasis. No consolidation to suggest pneumonia. Pulmonary vasculature is normal. No consolidation, pleural effusion, or pneumothorax. No acute osseous abnormalities are seen. IMPRESSION: Linear right perihilar atelectasis.  No pneumonia. Electronically Signed   By: Jeb Levering M.D.   On: 07/02/2015 00:47   Mr Jeri Cos ZS Contrast  06/22/2015  CLINICAL DATA:  Abnormal CT scan of the head. EXAM: MRI HEAD WITHOUT AND WITH CONTRAST TECHNIQUE: Multiplanar, multiecho pulse sequences of the brain and  surrounding structures were obtained without and with intravenous contrast. CONTRAST:  5m MULTIHANCE GADOBENATE DIMEGLUMINE 529 MG/ML IV SOLN COMPARISON:  CT head without contrast FINDINGS: Moderate generalized atrophy and white matter changes are present bilaterally. No acute infarct, hemorrhage, or mass lesion is present. The ventricles are of normal size. The ventricles are proportionate to the degree of atrophy. No significant extra-axial fluid collection is present. Is brainstem and cerebellum are within normal limits. Flow is present in the major intracranial arteries. Bilateral lens replacements are present. The globes and orbits are otherwise intact. A single posterior right ethmoid air cell is opacified. The remaining paranasal sinuses and mastoid air cells are clear. Postcontrast images demonstrate no pathologic enhancement. IMPRESSION: 1. Moderate atrophy and white matter disease. This likely reflects the sequela of chronic microvascular ischemia. 2. No acute infarct. Electronically Signed   By: CSan MorelleM.D.   On: 06/22/2015 19:50   [place] Drain  07/05/2015  CLINICAL DATA:  81year old female with a history of poorly differentiated carcinoma of the right liver lobe. Status post resection, has developed what appears to be abscess of the surgical site within the right liver lobe. Differential diagnosis includes biloma and abscess. EXAM: ULTRASOUND GUIDED DRAIN PLACEMENT OF RIGHT LIVER LOBE MEDICATIONS: No sedation. PROCEDURE: The procedure, risks, benefits, and alternatives were explained to the patient. Questions regarding the procedure were encouraged and answered. The patient understands and consents to the procedure. Ultrasound survey of the right upper quadrant was performed. Images were stored and sent to PACs. The right upper quadrant anterior wall was prepped with Betadine in a sterile fashion, and a sterile drape was applied covering the operative field. A sterile gown and  sterile gloves were used for the procedure. Local anesthesia was provided with 1% Lidocaine. Once the patient is prepped and draped in the usual sterile fashion, the skin and subcutaneous tissues were generously infiltrated with 1% lidocaine for local anesthesia. A small stab incision was made with 11 blade scalpel. Trocar technique was used to advance a 10 FPakistandrain in a transhepatic trajectory into the fluid collection of the rib surgical site and right upper abdomen. Bilious drainage was returned. Sample was sent to the lab. Pigtail catheter was formed and a final image was stored. Patient tolerated the procedure well and remained hemodynamically stable throughout. No complications encountered and no significant blood loss encountered. Drain was sutured  into position and left to gravity drainage. COMPLICATIONS: None. FINDINGS: Ultrasound survey demonstrates complex fluid collection within the right upper quadrant with hyperechoic central aspect, which may represent fat necrosis or omental infarct. Bilious fluid was easily aspirated once the drain was in place. IMPRESSION: Status post ultrasound-guided drainage of biloma of the surgical site. Ten [place] drain placed. Signed, Dulcy Fanny. Earleen Newport, DO [facility] Electronically Signed   By: Corrie Mckusick D.O.   On: 07/05/2015 17:59   Ct Abdomen Pelvis W Contrast  07/04/2015  CLINICAL DATA:  Three weeks status post resection of hepatic adenocarcinoma. Nausea. Persistent leukocytosis. EXAM: CT ABDOMEN AND PELVIS WITH CONTRAST TECHNIQUE: Multidetector CT imaging of the abdomen and pelvis was performed using the standard protocol following bolus administration of intravenous contrast. CONTRAST:  136m OMNIPAQUE IOHEXOL 300 MG/ML  SOLN COMPARISON:  05/03/2015 FINDINGS: Lower chest: Small bilateral pleural effusions noted as well as tiny pericardial effusion. Mild wall thickening of distal thoracic esophagus is  suspicious for esophagitis. Hepatobiliary: There has been interval resection of previously seen right hepatic lobe mass. There is an irregular rim enhancing fluid collection within the surgical bed in the right hepatic lobe which measures 8.1 x 8.8 cm on image 32/series 3. This could be due to postop fluid collection or abscess. No other liver lesions are identified. A small rim enhancing perihepatic fluid collection is also seen adjacent to the anterior right hepatic lobe which measures 1.8 x 7.8 cm on image 26/ series 3. This could also represent postop fluid collection or abscess. Prior cholecystectomy again noted. Dilatation of extrahepatic common bile duct remains stable. Pancreas: No mass, inflammatory changes, or other significant abnormality. Spleen: Within normal limits in size and appearance. Adrenals/Urinary Tract: No masses identified. No evidence of hydronephrosis. Mild diffuse bladder wall thickening noted as well as mild dilatation, possibly due to neurogenic bladder. Stomach/Bowel: Diverticulitis near the junction of the descending and sigmoid colon shows improvement since previous study. Vascular/Lymphatic: No pathologically enlarged lymph nodes. No evidence of abdominal aortic aneurysm. Reproductive: Previous hysterectomy a 3.8 cm cyst with a tiny posterior mural calcification is seen in the left adnexa on image 96 98/series 3 which is not significant changed since previous study. No definite malignant features identified. This could represent a diverticular abscess or cystic lesion of left ovary. Other: Small amount of free fluid in pelvic cul-de-sac. Musculoskeletal: No suspicious bone lesions identified. Spondylosis grade 2 anterolisthesis noted at L4-5. IMPRESSION: 9 cm rim enhancing fluid collection in right hepatic lobe surgical bed, and smaller perihepatic fluid collection adjacent to right lobe. Differential diagnosis includes postop fluid collections and abscess. Improving diverticulitis.  Persistent 3.8 cm left lower quadrant fluid collection, which could represent a diverticular abscess or left ovarian cystic lesion. No malignant features identified. Recommend continued attention on follow-up imaging. Small bilateral pleural effusions, tiny pericardial effusion, and small amount free fluid in pelvic cul-de-sac. Distal esophageal wall thickening, suspicious for esophagitis. Mildly distended urinary bladder with diffuse wall thickening, suspicious for neurogenic bladder. Electronically Signed   By: JEarle GellM.D.   On: 07/04/2015 15:15    Labs:  CBC:  Recent Labs  07/05/15 0415 07/06/15 0428 07/07/15 0530 07/09/15 0500 07/18/15 0926  WBC 14.5* 13.3* 12.6* 11.1* 8.9  HGB 11.0* 10.7* 9.5* 10.1*  --   HCT 33.5* 32.6* 28.7* 30.7* 33.3*  PLT 302 269 219 215  --     COAGS:  Recent Labs  05/04/15 1223 06/13/15 0830 06/15/15 0510 07/05/15 0415  INR 1.20 1.08  --  1.13  APTT  --   --  28  --     BMP:  Recent Labs  07/07/15 0530 07/08/15 0425 07/09/15 0500 07/18/15 0926  NA 138 136 132* 138  K 3.3* 3.7 3.6 3.5  CL 108 105 101 98  CO2 '26 25 26 25  '$ GLUCOSE 136* 128* 130* 132*  BUN '8 9 9 '$ 6*  CALCIUM 8.6* 8.7* 8.6* 9.9  CREATININE 0.74 0.88 0.85 0.92  GFRNONAA >60 57* 60* 56*  GFRAA >60 >60 >60 65    LIVER FUNCTION TESTS:  Recent Labs  06/23/15 0850 07/02/15 0026 07/07/15 0530 07/18/15 0926  BILITOT 1.1 0.6 0.5 0.3  AST 22 24 14* 21  ALT 45 17 13* 11  ALKPHOS 90 84 66 92  PROT 6.6 6.9 5.1* 6.4  ALBUMIN 3.1* 3.0* 2.0* 2.9*    TUMOR MARKERS:  Recent Labs  05/18/15 1019  AFPTM 6.5*  CEA <0.5  CA199 175.4*    Assessment and Plan:  CT of the abdomen was performed earlier today in follow-up. This shows decrease in size of the postoperative biloma adjacent to the liver resection margin with fluid remaining measuring roughly 6.8 x 4.5 x 3.0 cm. The collection previously measured up to about 9 cm in diameter. The drainage catheter is  well-positioned in the medial aspect of the collection. No new fluid collections are identified. There is increased prominence of a cystic abnormality in the left pelvis most likely associated with the left adnexal region.  Given residual bilious fluid output from the drain as well as residual fluid collection by CT, the drain was not removed today. The drain exit site is mildly irritated and does not appear to be overtly infected. A new dressing was applied to the catheter site today after evaluation. Instructions were given to the nursing facility to flush the catheter once daily with 10 mL of sterile saline and record drain output. The patient will be following up with Dr. Barry Dienes soon. I recommended repeat CT of the abdomen in 2 weeks.  SignedAletta Edouard T 07/20/2015, 3:12 PM     I spent a total of 15 Minutes in face to face in clinical consultation, greater than 50% of which was counseling/coordinating care post biloma drainage.

## 2015-07-21 NOTE — Progress Notes (Signed)
Nevin Bloodgood, RN from [facility] called to confirm that the nursing staff there have indeed been irrigating Destiny Velasquez's drain and documenting the output per instructions on patient's admission.  The documentation is in the nursing notes which were not sent with patient for her drain check yesterday.  They will, however, send this data with patient when she has her upcoming follow-up with her surgeon next week.  I thanked her for this information and asked if she'd also send it when patient has her next drain check (at [facility] 08/03/15).  Brita Romp, RN

## 2015-07-21 NOTE — Progress Notes (Signed)
I spoke with Bonnielee Haff, RN on 07/20/15 at 1445 about the reported inappropriate/inadequate care of Mrs. Lottes's drain discussed here today with Dr. Kathlene Cote, the patient and the patient's daughter during an assessment of the drain and fluid collection.  I reiterated to Hilda Blades, who stated she wasn't Mrs. Dobler's usual nurse and was just filling in today, the importance of flushing the drain daily with 10cc sterile normal saline and measuring and recording the output daily.  I told her these instructions are on the consultation form they sent with the patient and which the daughter is returning.  This morning at 0823 I left a voice mail message for Derenda Mis, Admissions/Social Worker for [employer] [phone]) with the above information so that she could share it during a care conference in which the staff, patient and the patient's daughter were participating today. Brita Romp, RN [phone])

## 2015-07-31 DIAGNOSIS — R410 Disorientation, unspecified: Secondary | ICD-10-CM | POA: Diagnosis not present

## 2015-07-31 DIAGNOSIS — R509 Fever, unspecified: Secondary | ICD-10-CM | POA: Diagnosis not present

## 2015-07-31 DIAGNOSIS — K219 Gastro-esophageal reflux disease without esophagitis: Secondary | ICD-10-CM | POA: Diagnosis not present

## 2015-08-01 LAB — CBC AND DIFFERENTIAL
HEMATOCRIT: 34 % — AB (ref 36–46)
HEMOGLOBIN: 11 g/dL — AB (ref 12.0–16.0)
PLATELETS: 304 10*3/uL (ref 150–399)
WBC: 8.2 10*3/mL

## 2015-08-01 LAB — BASIC METABOLIC PANEL
Creatinine: 0.9 mg/dL (ref 0.5–1.1)
Glucose: 148 mg/dL
Potassium: 3.1 mmol/L — AB (ref 3.4–5.3)
SODIUM: 137 mmol/L (ref 137–147)

## 2015-08-03 ENCOUNTER — Other Ambulatory Visit: Payer: Medicare Other

## 2015-08-03 ENCOUNTER — Ambulatory Visit (HOSPITAL_COMMUNITY)
Admission: RE | Admit: 2015-08-03 | Discharge: 2015-08-03 | Disposition: A | Discharge status: Home / Self Care | Payer: Medicare Other | Source: Ambulatory Visit | Attending: Interventional Radiology | Admitting: Interventional Radiology

## 2015-08-03 DIAGNOSIS — E785 Hyperlipidemia, unspecified: Secondary | ICD-10-CM | POA: Diagnosis not present

## 2015-08-03 DIAGNOSIS — K5732 Diverticulitis of large intestine without perforation or abscess without bleeding: Secondary | ICD-10-CM | POA: Insufficient documentation

## 2015-08-03 DIAGNOSIS — K668 Other specified disorders of peritoneum: Secondary | ICD-10-CM

## 2015-08-03 DIAGNOSIS — E039 Hypothyroidism, unspecified: Secondary | ICD-10-CM | POA: Insufficient documentation

## 2015-08-03 DIAGNOSIS — T8189XD Other complications of procedures, not elsewhere classified, subsequent encounter: Secondary | ICD-10-CM

## 2015-08-03 DIAGNOSIS — C7A1 Malignant poorly differentiated neuroendocrine tumors: Secondary | ICD-10-CM | POA: Diagnosis not present

## 2015-08-03 DIAGNOSIS — K750 Abscess of liver: Secondary | ICD-10-CM | POA: Diagnosis not present

## 2015-08-03 DIAGNOSIS — Z87891 Personal history of nicotine dependence: Secondary | ICD-10-CM | POA: Insufficient documentation

## 2015-08-03 DIAGNOSIS — G250 Essential tremor: Secondary | ICD-10-CM | POA: Insufficient documentation

## 2015-08-03 DIAGNOSIS — J90 Pleural effusion, not elsewhere classified: Secondary | ICD-10-CM | POA: Insufficient documentation

## 2015-08-03 DIAGNOSIS — C227 Other specified carcinomas of liver: Secondary | ICD-10-CM | POA: Diagnosis not present

## 2015-08-03 DIAGNOSIS — K219 Gastro-esophageal reflux disease without esophagitis: Secondary | ICD-10-CM | POA: Insufficient documentation

## 2015-08-03 DIAGNOSIS — Z7982 Long term (current) use of aspirin: Secondary | ICD-10-CM | POA: Insufficient documentation

## 2015-08-03 DIAGNOSIS — I10 Essential (primary) hypertension: Secondary | ICD-10-CM | POA: Diagnosis not present

## 2015-08-03 DIAGNOSIS — Z853 Personal history of malignant neoplasm of breast: Secondary | ICD-10-CM | POA: Insufficient documentation

## 2015-08-03 DIAGNOSIS — Z4803 Encounter for change or removal of drains: Secondary | ICD-10-CM | POA: Insufficient documentation

## 2015-08-03 MED ORDER — IOHEXOL 300 MG/ML  SOLN: 100.0000 mL | Freq: Once | INTRAMUSCULAR | Status: DC | PRN

## 2015-08-03 MED ORDER — IOHEXOL 300 MG/ML  SOLN
150.0000 mL | Freq: Once | INTRAMUSCULAR | Status: AC | PRN
  Administered 2015-08-03: 100 mL via INTRAVENOUS

## 2015-08-03 MED ORDER — IOHEXOL 300 MG/ML  SOLN
20.0000 mL | Freq: Once | INTRAMUSCULAR | Status: AC | PRN
  Administered 2015-08-03: 15 mL

## 2015-08-03 NOTE — Progress Notes (Signed)
Patient ID: Destiny Velasquez, female   DOB: 14-Mar-1926, 81 y.o.   MRN: [id number]    Chief Complaint: Patient was seen in consultation today for percutaneous drainage catheter management  Referring Physician(s): Byerly  History of Present Illness: Destiny Velasquez is a 81 y.o. female who is well-known to the interventional radiology Department, post perihepatic percutaneous drainage catheter placement on 07/05/2015  following partial right hepatectomy. Pathology was compatible with a poorly differentiated adenocarcinoma.  The patient was initially seen at the interventional radiology drain clinic on 07/20/2015 and returns today for percutaneous drainage catheter management. The patient is a poor historian and is accompanied by her daughter.  The patient reports the percutaneous drainage catheter is being flushed at least once a day.  The patient's nursing facility has provided a detailed list of the recorded output from the percutaneous drainage catheter. Initially after being seen at the drain clinic on 07/20/2015, the drainage catheter was putting out approximately 50 mL per today. The drain has only been putting out approximate 15 mL per day during the past 2-3 days.   The fluid currently in the gravity bag is serous in appearance.  The patient is without complaint. No fevers or chills. No abdominal pain.  Past Medical History  Diagnosis Date  . Hypertension   . Hyperlipidemia   . Thyroid disease   . Subacute delirium   . Essential and other specified forms of tremor   . Unspecified hypothyroidism   . Diverticulosis of colon (without mention of hemorrhage)   . Unspecified constipation   . Anal and rectal polyp   . Disorder of bone and cartilage, unspecified   . Pneumonia X 1  . GERD (gastroesophageal reflux disease)   . Malignant neoplasm of breast (female), unspecified site   . Breast cancer, right breast ([place]) 1971    "never had chemo or radiation"  . Liver cancer ([place]) 2016  .  Diverticulitis of large intestine without perforation or abscess without bleeding     Past Surgical History  Procedure Laterality Date  . Appendectomy    . Abdominal hysterectomy    . Tonsillectomy and adenoidectomy  1935  . Cholecystectomy    . Breast biopsy Right 1971  . Mastectomy Right 1971  . Tubal ligation    . Cataract extraction w/ intraocular lens  implant, bilateral Bilateral   . Rectal polypectomy  early 2000's  . Laparoscopy N/A 06/14/2015    Procedure: LAPAROSCOPY DIAGNOSTIC;  Surgeon: [name], MD;  Location: [facility];  Service: General;  Laterality: N/A;  . Open partial hepatectomy [83] N/A 06/14/2015    Procedure:  OPEN PARTIAL RIGHT HEPATECTOMY;  Surgeon: [name], MD;  Location: [facility];  Service: General;  Laterality: N/A;  converted to open @ 1447  . Liver biopsy  06/14/2015    Procedure: LIVER BIOPSY;  Surgeon: [name], MD;  Location: [facility];  Service: General;;    Allergies: Review of patient's allergies indicates no known allergies.  Medications: Prior to Admission medications   Medication Sig Start Date End Date Taking? Authorizing Provider  acetaminophen (TYLENOL) 325 MG tablet Take 1-2 tablets (325-650 mg total) by mouth every 6 (six) hours as needed for fever, headache, mild pain or moderate pain. Patient taking differently: Take 325 mg by mouth every 6 (six) hours as needed for fever, headache, mild pain or moderate pain.  06/22/15   [name], MD  alum & mag hydroxide-simeth (MAALOX/MYLANTA) 200-200-20 MG/5ML suspension Take 30 mLs by mouth every 6 (six) hours as  needed for indigestion or heartburn (or bloating). 06/22/15   [name], MD  amoxicillin-clavulanate (AUGMENTIN) 250-62.5 MG/5ML suspension Take 17.5 mLs (875 mg total) by mouth 2 (two) times daily. 07/11/15   Verlee Monte, MD  aspirin 81 MG tablet Take 81 mg by mouth daily.    Historical Provider, MD  doxycycline (DORYX) 100 MG EC tablet Take 100 mg by mouth. Take one tablet for 7  days    Historical Provider, MD  HYDROcodone-acetaminophen (NORCO/VICODIN) 5-325 MG tablet Take 1 tablet by mouth every 6 (six) hours as needed for moderate pain. 07/11/15   Verlee Monte, MD  levothyroxine (SYNTHROID, LEVOTHROID) 100 MCG tablet TAKE ONE TABLET BY MOUTH 30 MINUTES BEFORE BREAKFAST EVERY MORNING. SEPARATE FROM OTHER MEDICATIONS FOR THYROID. 12/20/14   [name] L [name], DO  LORazepam (ATIVAN) 0.5 MG tablet Take 1 tablet (0.5 mg total) by mouth every 6 (six) hours as needed for anxiety. Patient taking differently: Take 0.5 mg by mouth every 4 (four) hours as needed for anxiety. Take one tablet at bedtime 07/11/15   Verlee Monte, MD  losartan (COZAAR) 50 MG tablet Take one tablet by mouth once daily for blood pressure Patient taking differently: Take 50 mg by mouth daily. Take one tablet by mouth once daily for blood pressure 02/28/15   [name] L [name], DO  ondansetron (ZOFRAN) 4 MG tablet Take 1 tablet (4 mg total) by mouth every 8 (eight) hours as needed for nausea or vomiting. 05/09/15   [name] L [name], DO  OVER THE COUNTER MEDICATION Take 1 Container by mouth 3 (three) times daily. Med pass    Historical Provider, MD  pantoprazole (PROTONIX) 40 MG tablet Take 1 tablet (40 mg total) by mouth daily. 07/11/15   Verlee Monte, MD  polyethylene glycol (MIRALAX / GLYCOLAX) packet Take 17 g by mouth daily.     Historical Provider, MD  Probiotic Product (PROBIOTIC DAILY PO) Take 1 tablet by mouth daily.     Historical Provider, MD  shark liver oil-cocoa butter (PREPARATION H) 0.25-3-85.5 % suppository Place 1 suppository rectally as needed for hemorrhoids.    Historical Provider, MD  simethicone (MYLICON) 166 MG chewable tablet Chew 1 tablet (125 mg total) by mouth 3 (three) times daily before meals. 07/11/15   Verlee Monte, MD  traMADol (ULTRAM) 50 MG tablet Take 1 tablet (50 mg total) by mouth every 6 (six) hours as needed for moderate pain or severe pain. 07/11/15   Verlee Monte, MD     Family  History  Problem Relation Age of Onset  . Cancer Mother   . Hypertension Father     Social History   Social History  . Marital Status: Married    Spouse Name: N/A  . Number of Children: N/A  . Years of Education: N/A   Social History Main Topics  . Smoking status: Former Smoker -- 0.50 packs/day for 40 years    Types: Cigarettes    Quit date: 08/13/2012  . Smokeless tobacco: Never Used     Comment: "quit smoking cigarettes years ago; I was probably in my 63's"  . Alcohol Use: 3.0 oz/week    5 Glasses of wine per week     Comment: once per month   . Drug Use: No  . Sexual Activity: No   Other Topics Concern  . Not on file   Social History Narrative    ECOG Status: 2 - Symptomatic, <50% confined to bed  Review of Systems: A 12 point ROS discussed and  pertinent positives are indicated in the HPI above.  All other systems are negative.  Review of Systems  Vital Signs: There were no vitals taken for this visit.  Physical Exam  Abdominal:    Location of the percutaneous drainage catheter. There is no surrounding erythema. The right upper abdominal quadrant is nontender with palpation.    Mallampati Score:     Imaging: [place] Abscess Drain  07/05/2015  CLINICAL DATA:  81 year old female with a history of poorly differentiated carcinoma of the right liver lobe. Status post resection, has developed what appears to be abscess of the surgical site within the right liver lobe. Differential diagnosis includes biloma and abscess. EXAM: ULTRASOUND GUIDED DRAIN PLACEMENT OF RIGHT LIVER LOBE MEDICATIONS: No sedation. PROCEDURE: The procedure, risks, benefits, and alternatives were explained to the patient. Questions regarding the procedure were encouraged and answered. The patient understands and consents to the procedure. Ultrasound survey of the right upper quadrant was performed. Images were stored and sent to PACs. The right upper quadrant anterior wall was prepped with  Betadine in a sterile fashion, and a sterile drape was applied covering the operative field. A sterile gown and sterile gloves were used for the procedure. Local anesthesia was provided with 1% Lidocaine. Once the patient is prepped and draped in the usual sterile fashion, the skin and subcutaneous tissues were generously infiltrated with 1% lidocaine for local anesthesia. A small stab incision was made with 11 blade scalpel. Trocar technique was used to advance a 10 [place] drain in a transhepatic trajectory into the fluid collection of the rib surgical site and right upper abdomen. Bilious drainage was returned. Sample was sent to the lab. Pigtail catheter was formed and a final image was stored. Patient tolerated the procedure well and remained hemodynamically stable throughout. No complications encountered and no significant blood loss encountered. Drain was sutured into position and left to gravity drainage. COMPLICATIONS: None. FINDINGS: Ultrasound survey demonstrates complex fluid collection within the right upper quadrant with hyperechoic central aspect, which may represent fat necrosis or omental infarct. Bilious fluid was easily aspirated once the drain was in place. IMPRESSION: Status post ultrasound-guided drainage of biloma of the surgical site. Ten [place] drain placed. Signed, Dulcy Fanny. Earleen Newport, DO [facility] Radiology Electronically Signed   By: Corrie Mckusick D.O.   On: 07/05/2015 17:59   Ct Abdomen Pelvis W Contrast  08/03/2015  CLINICAL DATA:  Post right hepatic resection for poorly differentiated adenocarcinoma, complicated by development of postoperative biloma. Patient underwent percutaneous drainage catheter placement on 07/05/2015. Patient returns today to the interventional radiology department for repeat CT scan. EXAM: CT ABDOMEN AND PELVIS WITH CONTRAST TECHNIQUE: Multidetector CT imaging of the abdomen and pelvis was performed using the  standard protocol following bolus administration of intravenous contrast. CONTRAST:  151m OMNIPAQUE IOHEXOL 300 MG/ML  SOLN COMPARISON:  CT abdomen pelvis - 07/04/2015; 07/20/2015; ultrasound-guided right perihepatic percutaneous drainage catheter placement - 07/05/2015 FINDINGS: Post right partial hepatectomy. Unchanged positioning of right sided perihepatic percutaneous drainage catheter placement. There has been interval decrease in the size of the residual fluid collection, now measuring approximately 6.2 x 3.7 x 2.7 cm, previously, 6.8 x 4.4 x 3.0 cm. There is a minimal amount of ill-defined stranding about the right upper abdominal quadrant without new definable / drainable intra-abdominal fluid collection. There is persistent mild nodularity of the hepatic contour. No discrete hyper enhancing hepatic lesions. Post cholecystectomy with unchanged mild dilatation of the common bile duct measuring 9  mm in greatest oblique coronal diameter (coronal image 34, series 602) and mild centralized intrahepatic biliary duct dilatation, likely sequela post cholecystectomy state. There is symmetric enhancement of the bilateral kidneys. No definite renal stones this postcontrast examination. No discrete renal lesions. No urinary obstruction or perinephric stranding. Normal appearance of the bilateral adrenal glands and pancreas. Several punctate granuloma are seen within otherwise normal-appearing spleen. There is near circumferential wall thickening involving an approximately 12 cm segment of sigmoid colon within the left lower abdominal quadrant (representative coronal images 24, and 30, series 602, axial images 60 through 67, series 2). These findings are associated with a minimal amount of adjacent mesenteric stranding, unchanged to minimally worsened compared to the 07/20/2015 examination. There is a grossly unchanged adjacent peripherally calcified structure adjacent to the ventral aspect of the sigmoid colon which is  favored to represent a peripherally calcified epiploic appendage. Moderate colonic stool burden without evidence of enteric obstruction. The bowel is otherwise normal in course and caliber. Normal appearance of the terminal ileum. The appendix is not visualized, however there is no pericecal inflammatory change. No pneumoperitoneum, pneumatosis or portal venous gas. Large amount of eccentric mixed calcified and noncalcified atherosclerotic plaque within a normal caliber abdominal aorta. The major branch vessels of the abdominal aorta appear patent on this non CTA examination. Scattered retroperitoneal lymph nodes are individually not enlarged by size criteria. No retroperitoneal, mesenteric, pelvic or inguinal lymphadenopathy. There is diffuse thickening of the urinary bladder wall, potentially accentuated underdistention. No free fluid in the pelvic cul-de-sac. Complex multi septated cystic left adnexal structure is grossly unchanged in size with dominant cystic component measuring approximately 4.0 x 2.5 cm (image 63, series 2) and additional adjacent cystic component measuring approximately 1.4 x 2.7 cm. Post hysterectomy. No discrete adnexal lesions. Limited visualization of the lower thorax demonstrates interval resolution of trace left-sided effusion with interval decrease in persistent trace right-sided pleural effusion. Minimal subsegmental atelectasis within the right costophrenic angle. No focal airspace opacities. Normal heart size. Trace amount of pericardial fluid, presumably physiologic. Coronary artery calcifications. No acute or aggressive osseous abnormalities. Unchanged grade 1 anterolisthesis of L4 upon L5 measuring approximately 9 mm in diameter without associated pars defects. Well-healed midline and right upper abdominal quadrant incision without evidence of hernia. Mild diffuse body wall anasarca. IMPRESSION: 1. Slight interval decrease in size of residual approximately 6.2 cm perihepatic  fluid collection, previously, 6.8 cm. 2. Near circumferential thickening involving an approximately 12 cm length of sigmoid colon within the left lower abdominal quadrant. While potentially representative of uncomplicated acute diverticulitis, an underlying discrete colonic malignancy is not excluded on the basis of this examination. Clinical correlation is advised. Further evaluation with colonoscopy (if not recently performed) may performed as clinically indicated. No evidence of perforation or definable/drainable fluid collection. 3. Diffuse thickening urinary bladder wall, presumably accentuated due to underdistention though cystitis could have a similar appearance. 4. Grossly unchanged size and appearance of complex left adnexal cystic structure with dominant cystic component measuring approximately 4 cm in diameter. Further evaluation with pelvic ultrasound is again recommended. Electronically Signed   By: Sandi Mariscal M.D.   On: 08/03/2015 15:24   Ct Abdomen Pelvis W Contrast  07/20/2015  CLINICAL DATA:  Status post percutaneous drainage of postoperative biloma at the margin of surgical resection of the right lobe of the liver status post resection of adenocarcinoma of presumed liver origin. The drain was placed on 07/05/2015. Clinically there is some continued fluid output from the drain. Total amount  of drainage is poorly documented at the patient's nursing facility. EXAM: CT ABDOMEN AND PELVIS WITH CONTRAST TECHNIQUE: Multidetector CT imaging of the abdomen and pelvis was performed using the standard protocol following bolus administration of intravenous contrast. CONTRAST:  115m ISOVUE-300 IOPAMIDOL (ISOVUE-300) INJECTION 61% COMPARISON:  07/04/2015 FINDINGS: A percutaneous drain is seen in the right upper quadrant with the pigtail portion of the drainage catheter along the resection margin of the posterior right lobe of the liver. Residual fluid collection measures approximately 6.8 x 4.5 x 3.0 cm and  shows decrease in size since placement of the drainage catheter. The drainage catheter is well positioned in the medial aspect of the collection. The liver parenchyma enhances normally and there is no evidence of residual tumor. There is no intrahepatic biliary ductal dilatation. A small amount of free fluid is seen adjacent to the dome of the liver. There also is a small right pleural effusion. The pancreas, spleen, adrenal glands and kidneys are stable in appearance and unremarkable. The spleen contains a few calcified granulomata. The gallbladder has been removed. Bowel loops show no evidence of obstruction or inflammation. No masses or enlarged lymph nodes are seen. Cystic abnormality in the left adnexal region again noted which has enlarged slightly with dimensions of approximately 3.7 x 3.8 cm. Previously noted free fluid in the dependent pelvis has resolved. IMPRESSION: 1. Decrease in size of the perihepatic biloma after catheter drainage with residual fluid collection remaining. The drainage catheter is well positioned in the medial aspect of the collection. No new fluid collections are seen. 2. Cystic changes in the left adnexal region appears slightly larger. Eventual correlation with pelvic ultrasound is recommended. 3. A separate clinical evaluation was performed and is dictated separately in Epic. Electronically Signed   By: GAletta EdouardM.D.   On: 07/20/2015 15:10   Ir Sinus/fist Tube Chk-non Gi  08/03/2015  CLINICAL DATA:  Post right hepatic resection for poorly differentiated adenocarcinoma, complicated by development of postoperative biloma. Patient underwent percutaneous drainage catheter placement on 07/05/2015. Patient returns today to the interventional radiology department for repeat CT scan and percutaneous drainage catheter management. Preceding CT the abdomen pelvis performed earlier today demonstrates slight decrease in size of residual perihepatic fluid collection with dominant  component measuring approximately 6.2 cm, previously, 6.8 cm. Patient now presents to the Interventional Radiology suite for percutaneous drainage catheter injection. The patient states the percutaneous drainage catheter has been flushed with 5-10 cc of saline at least once per day (note, the patient is a poor historian). The patient's nursing facility has provided a detailed list of recorded output from the percutaneous drainage catheter. Initially after being seen by Dr. YKathlene Coteat the interventional radiology drain Clinic on 07/20/2015, the percutaneous drainage catheter was putting out approximately 50 cc per day. The percutaneous drainage catheter has been putting out approximately 15 cc of fluid during the past 2-3 days. The fluid currently in the gravity bag is serous in appearance. The patient is without complaint. No fever or chills. No abdominal pain. EXAM: SINUS TRACT INJECTION/FISTULOGRAM COMPARISON:  CT abdomen pelvis - earlier same day; 07/20/2015; 07/04/2015; ultrasound-guided perihepatic percutaneous drainage catheter placement -07/05/2015 CONTRAST:  10 cc Omnipaque 300, administered via the existing perihepatic percutaneous drainage catheter. FLUOROSCOPY TIME:  54 seconds (14 mGy) TECHNIQUE: The patient was positioned supine on the fluoroscopy table. A preprocedural spot fluoroscopic image was obtained of the right upper abdominal quadrant existing. Patent percutaneous drainage catheter. Multiple spot fluoroscopic and radiographic images were obtained in various  obliquities following injection of a small amount of contrast via the existing percutaneous drainage catheter. Images were reviewed and the procedure was terminated. The percutaneous drainage catheter was reconnected to a gravity bag. A dressing was placed. The patient tolerated the procedure well without immediate postprocedural complication. FINDINGS: Preprocedural spot fluoroscopic image demonstrates unchanged positioning of the  perihepatic percutaneous drainage catheter with and coiled and locked within the right upper abdominal quadrant. Contrast injection demonstrates opacification of the residual though largely decompressed perihepatic abscess cavity. There is no definitive communication of the abscess cavity and the adjacent biliary system. There is minimal reflux of contrast along the drainage catheter track to the lateral surface of the remaining right lobe of the liver. IMPRESSION: Percutaneous drainage catheter injection demonstrates opacification of the residual though largely decompressed perihepatic abscess cavity. No definitive communication to the biliary system. PLAN: Given the persistent output from the percutaneous drainage catheter (currently about approximately 15 cc per day) as well as the only slightly decreased perihepatic fluid collection (currently measuring 6.2 cm in greatest diameter, previously, 6.8 cm), the decision was made to maintain the percutaneous drainage catheter in place for 2 more weeks. The patient was instructed to continue to record the output from the percutaneous drainage catheter. Though was instructed to no longer flush the drain. Written instructions as such were provided to the patient's nursing facility. The patient will return to the interventional radiology drain Clinic on during the week of 08/16/2015 for IV only abdominal CT as well as potential repeat percutaneous drainage catheter injection. Hopefully, the percutaneous drainage catheter may be removed at that time. The patient was encouraged to keep her follow-up appointment with Dr. Barry Dienes scheduled for next week. Electronically Signed   By: Sandi Mariscal M.D.   On: 08/03/2015 15:33    Labs:  CBC:  Recent Labs  07/05/15 0415 07/06/15 0428 07/07/15 0530 07/09/15 0500 07/18/15 0926  WBC 14.5* 13.3* 12.6* 11.1* 8.9  HGB 11.0* 10.7* 9.5* 10.1*  --   HCT 33.5* 32.6* 28.7* 30.7* 33.3*  PLT 302 269 219 215  --      COAGS:  Recent Labs  05/04/15 1223 06/13/15 0830 06/15/15 0510 07/05/15 0415  INR 1.20 1.08  --  1.13  APTT  --   --  28  --     BMP:  Recent Labs  07/07/15 0530 07/08/15 0425 07/09/15 0500 07/18/15 0926  NA 138 136 132* 138  K 3.3* 3.7 3.6 3.5  CL 108 105 101 98  CO2 '26 25 26 25  '$ GLUCOSE 136* 128* 130* 132*  BUN '8 9 9 '$ 6*  CALCIUM 8.6* 8.7* 8.6* 9.9  CREATININE 0.74 0.88 0.85 0.92  GFRNONAA >60 57* 60* 56*  GFRAA >60 >60 >60 65    LIVER FUNCTION TESTS:  Recent Labs  06/23/15 0850 07/02/15 0026 07/07/15 0530 07/18/15 0926  BILITOT 1.1 0.6 0.5 0.3  AST 22 24 14* 21  ALT 45 17 13* 11  ALKPHOS 90 84 66 92  PROT 6.6 6.9 5.1* 6.4  ALBUMIN 3.1* 3.0* 2.0* 2.9*    TUMOR MARKERS:  Recent Labs  05/18/15 1019  AFPTM 6.5*  CEA <0.5  CA199 175.4*    Assessment and Plan:  Destiny Velasquez is a 81 y.o. female who is well-known to the interventional radiology Department, post perihepatic percutaneous drainage catheter placement on 07/05/2015  following partial right hepatectomy. Pathology was compatible with a poorly differentiated adenocarcinoma.    Repeat CT scan performed earlier today demonstrates slight decrease in  the size of the residual perihepatic fluid collection, currently measuring 6.2 cm in diameter, previously 6.8 cm. subsequent percutaneous drainage catheter injection was negative for the presence of a fistulous connection to the adjacent biliary tree.  Given the persistent relatively high volume output from the percutaneous drainage catheter (presently approximately 15 mL per day) as well as the lack of significant change in the perihepatic fluid collection, the decision was made to maintain the percutaneous drainage catheter in place for an additional 2 weeks.  During this time, the percutaneous drainage catheter will be maintained to a gravity bag. The patient was instructed to continue with diligent recording of the drainage output. The patient was  instructed to no longer flush the drain. (Written instructions were provided with the patient for the staff at her providing nursing home).  The patient will return to the interventional radiology clinic in 2 weeks (during the week of 08/16/2015) for repeat IV only CT scan of the abdomen and pelvis as well as a possible repeat percutaneous drainage catheter injection. Hopefully, the patient's drainage catheter may be removed at that time.  *Of note, CT scan performed earlier today demonstrates an approximately 12 cm length segment of sigmoid colon with rather circumferential wall thickening. Differential considerations include acute uncomplicated diverticulitis (though the patient is presently asymptomatic), though a discrete underlying colonic neoplasm could potentially have a similar appearance. If not recently performed, further evaluation colonoscopy is recommended. This may be discussed at the patient's subsequent visit with Dr. Barry Dienes scheduled for next week.*  A copy of this report was sent to the requesting provider on this date.  SignedSandi Mariscal 08/03/2015, 3:57 PM   I spent a total of 10 Minutes in face to face in clinical consultation, greater than 50% of which was counseling/coordinating care for percutaneous drainage catheter management

## 2015-08-09 DIAGNOSIS — I10 Essential (primary) hypertension: Secondary | ICD-10-CM | POA: Diagnosis not present

## 2015-08-09 DIAGNOSIS — E46 Unspecified protein-calorie malnutrition: Secondary | ICD-10-CM | POA: Diagnosis not present

## 2015-08-09 DIAGNOSIS — C229 Malignant neoplasm of liver, not specified as primary or secondary: Secondary | ICD-10-CM | POA: Diagnosis not present

## 2015-08-10 ENCOUNTER — Other Ambulatory Visit: Payer: Self-pay | Admitting: General Surgery

## 2015-08-10 DIAGNOSIS — L0291 Cutaneous abscess, unspecified: Secondary | ICD-10-CM

## 2015-08-10 DIAGNOSIS — C229 Malignant neoplasm of liver, not specified as primary or secondary: Secondary | ICD-10-CM

## 2015-08-14 DIAGNOSIS — Z853 Personal history of malignant neoplasm of breast: Secondary | ICD-10-CM | POA: Diagnosis not present

## 2015-08-14 DIAGNOSIS — C787 Secondary malignant neoplasm of liver and intrahepatic bile duct: Secondary | ICD-10-CM | POA: Diagnosis not present

## 2015-08-14 DIAGNOSIS — L0291 Cutaneous abscess, unspecified: Secondary | ICD-10-CM | POA: Diagnosis not present

## 2015-08-14 DIAGNOSIS — N179 Acute kidney failure, unspecified: Secondary | ICD-10-CM | POA: Diagnosis not present

## 2015-08-14 DIAGNOSIS — Z90710 Acquired absence of both cervix and uterus: Secondary | ICD-10-CM | POA: Diagnosis not present

## 2015-08-14 DIAGNOSIS — I2510 Atherosclerotic heart disease of native coronary artery without angina pectoris: Secondary | ICD-10-CM | POA: Diagnosis not present

## 2015-08-14 DIAGNOSIS — K7689 Other specified diseases of liver: Secondary | ICD-10-CM | POA: Diagnosis not present

## 2015-08-14 DIAGNOSIS — Z9049 Acquired absence of other specified parts of digestive tract: Secondary | ICD-10-CM | POA: Diagnosis not present

## 2015-08-14 DIAGNOSIS — E079 Disorder of thyroid, unspecified: Secondary | ICD-10-CM | POA: Diagnosis not present

## 2015-08-14 DIAGNOSIS — N390 Urinary tract infection, site not specified: Secondary | ICD-10-CM | POA: Diagnosis not present

## 2015-08-14 DIAGNOSIS — C228 Malignant neoplasm of liver, primary, unspecified as to type: Secondary | ICD-10-CM | POA: Diagnosis not present

## 2015-08-14 DIAGNOSIS — Z9889 Other specified postprocedural states: Secondary | ICD-10-CM | POA: Diagnosis not present

## 2015-08-14 DIAGNOSIS — D72829 Elevated white blood cell count, unspecified: Secondary | ICD-10-CM | POA: Diagnosis not present

## 2015-08-14 DIAGNOSIS — K219 Gastro-esophageal reflux disease without esophagitis: Secondary | ICD-10-CM | POA: Diagnosis not present

## 2015-08-14 DIAGNOSIS — I10 Essential (primary) hypertension: Secondary | ICD-10-CM | POA: Diagnosis not present

## 2015-08-14 DIAGNOSIS — N12 Tubulo-interstitial nephritis, not specified as acute or chronic: Secondary | ICD-10-CM | POA: Diagnosis not present

## 2015-08-14 DIAGNOSIS — R601 Generalized edema: Secondary | ICD-10-CM | POA: Diagnosis not present

## 2015-08-14 DIAGNOSIS — E871 Hypo-osmolality and hyponatremia: Secondary | ICD-10-CM | POA: Diagnosis not present

## 2015-08-14 DIAGNOSIS — E785 Hyperlipidemia, unspecified: Secondary | ICD-10-CM | POA: Diagnosis not present

## 2015-08-14 DIAGNOSIS — M5136 Other intervertebral disc degeneration, lumbar region: Secondary | ICD-10-CM | POA: Diagnosis not present

## 2015-08-14 DIAGNOSIS — Z09 Encounter for follow-up examination after completed treatment for conditions other than malignant neoplasm: Secondary | ICD-10-CM | POA: Diagnosis present

## 2015-08-14 DIAGNOSIS — Z87891 Personal history of nicotine dependence: Secondary | ICD-10-CM | POA: Diagnosis not present

## 2015-08-14 DIAGNOSIS — R279 Unspecified lack of coordination: Secondary | ICD-10-CM | POA: Diagnosis not present

## 2015-08-14 DIAGNOSIS — M6281 Muscle weakness (generalized): Secondary | ICD-10-CM | POA: Diagnosis not present

## 2015-08-14 DIAGNOSIS — C229 Malignant neoplasm of liver, not specified as primary or secondary: Secondary | ICD-10-CM | POA: Diagnosis present

## 2015-08-14 DIAGNOSIS — R278 Other lack of coordination: Secondary | ICD-10-CM | POA: Diagnosis not present

## 2015-08-14 DIAGNOSIS — R1314 Dysphagia, pharyngoesophageal phase: Secondary | ICD-10-CM | POA: Diagnosis not present

## 2015-08-16 ENCOUNTER — Telehealth: Payer: Self-pay

## 2015-08-16 NOTE — Telephone Encounter (Signed)
Cancer Type ID request given to pathology.

## 2015-08-17 ENCOUNTER — Other Ambulatory Visit: Payer: Self-pay | Admitting: General Surgery

## 2015-08-17 ENCOUNTER — Ambulatory Visit (HOSPITAL_COMMUNITY)
Admission: RE | Admit: 2015-08-17 | Discharge: 2015-08-17 | Disposition: A | Discharge status: Home / Self Care | Payer: Medicare Other | Source: Ambulatory Visit | Attending: General Surgery | Admitting: General Surgery

## 2015-08-17 ENCOUNTER — Other Ambulatory Visit (HOSPITAL_COMMUNITY)
Admission: RE | Admit: 2015-08-17 | Discharge: 2015-08-17 | Disposition: A | Discharge status: Home / Self Care | Payer: Medicare Other | Source: Ambulatory Visit | Attending: Hematology and Oncology | Admitting: Hematology and Oncology

## 2015-08-17 ENCOUNTER — Encounter (HOSPITAL_COMMUNITY): Payer: Self-pay

## 2015-08-17 DIAGNOSIS — Z09 Encounter for follow-up examination after completed treatment for conditions other than malignant neoplasm: Secondary | ICD-10-CM | POA: Insufficient documentation

## 2015-08-17 DIAGNOSIS — K7689 Other specified diseases of liver: Secondary | ICD-10-CM | POA: Diagnosis not present

## 2015-08-17 DIAGNOSIS — Z853 Personal history of malignant neoplasm of breast: Secondary | ICD-10-CM | POA: Insufficient documentation

## 2015-08-17 DIAGNOSIS — L0291 Cutaneous abscess, unspecified: Secondary | ICD-10-CM

## 2015-08-17 DIAGNOSIS — Z9889 Other specified postprocedural states: Secondary | ICD-10-CM | POA: Insufficient documentation

## 2015-08-17 DIAGNOSIS — I10 Essential (primary) hypertension: Secondary | ICD-10-CM | POA: Diagnosis not present

## 2015-08-17 DIAGNOSIS — C787 Secondary malignant neoplasm of liver and intrahepatic bile duct: Secondary | ICD-10-CM | POA: Insufficient documentation

## 2015-08-17 DIAGNOSIS — E785 Hyperlipidemia, unspecified: Secondary | ICD-10-CM | POA: Diagnosis not present

## 2015-08-17 DIAGNOSIS — Z9049 Acquired absence of other specified parts of digestive tract: Secondary | ICD-10-CM | POA: Insufficient documentation

## 2015-08-17 DIAGNOSIS — Z90710 Acquired absence of both cervix and uterus: Secondary | ICD-10-CM | POA: Insufficient documentation

## 2015-08-17 DIAGNOSIS — I2510 Atherosclerotic heart disease of native coronary artery without angina pectoris: Secondary | ICD-10-CM | POA: Insufficient documentation

## 2015-08-17 DIAGNOSIS — E079 Disorder of thyroid, unspecified: Secondary | ICD-10-CM | POA: Diagnosis not present

## 2015-08-17 DIAGNOSIS — C229 Malignant neoplasm of liver, not specified as primary or secondary: Secondary | ICD-10-CM | POA: Insufficient documentation

## 2015-08-17 DIAGNOSIS — M5136 Other intervertebral disc degeneration, lumbar region: Secondary | ICD-10-CM | POA: Insufficient documentation

## 2015-08-17 DIAGNOSIS — K219 Gastro-esophageal reflux disease without esophagitis: Secondary | ICD-10-CM | POA: Diagnosis not present

## 2015-08-17 DIAGNOSIS — Z87891 Personal history of nicotine dependence: Secondary | ICD-10-CM | POA: Insufficient documentation

## 2015-08-17 DIAGNOSIS — R601 Generalized edema: Secondary | ICD-10-CM | POA: Insufficient documentation

## 2015-08-17 MED ORDER — IOHEXOL 300 MG/ML  SOLN
100.0000 mL | Freq: Once | INTRAMUSCULAR | Status: AC | PRN
  Administered 2015-08-17: 100 mL via INTRAVENOUS

## 2015-08-17 NOTE — Progress Notes (Signed)
Patient ID: Destiny Velasquez, female   DOB: 1925-09-25, 82 y.o.   MRN: [id number]    Chief Complaint: Patient was seen in consultation today for percutaneous drainage catheter management at the request of [place]  Referring Physician(s): Byerly,Faera  History of Present Illness: Destiny Velasquez is a 82 y.o. female who is well-known to the interventional radiology Department, post hepatic percutaneous drainage catheter placement on 07/05/2015 following a partial right hepatectomy for poorly differentiated adenocarcinoma.  The patient was initially seen in interventional radiology drain clinic on 07/20/2015 and return to the [facility] interventional radiology Department on 08/03/2015 for percutaneous drainage catheter management.  The patient returns today to the interventional radiology Department at [facility] for percutaneous drainage catheter management following the acquisition of a repeat CT scan performed earlier today. The patient is accompanied by her daughter.  As previously instructed, the patient is no longer flushing the percutaneous drainage catheter. Diligent records have been provided by the patient's nursing home which described no output from the parking is drainage catheter within the past 3 days.   The patient is currently without complaint. The patient denies fever or chills. No abdominal pain. No diarrhea or constipation. No bloody or melanotic stools.    Past Medical History  Diagnosis Date  . Hypertension   . Hyperlipidemia   . Thyroid disease   . Subacute delirium   . Essential and other specified forms of tremor   . Unspecified hypothyroidism   . Diverticulosis of colon (without mention of hemorrhage)   . Unspecified constipation   . Anal and rectal polyp   . Disorder of bone and cartilage, unspecified   . Pneumonia X 1  . GERD (gastroesophageal reflux disease)   . Malignant neoplasm of breast (female), unspecified site   . Breast cancer, right breast  ([place]) 1971    "never had chemo or radiation"  . Liver cancer ([place]) 2016  . Diverticulitis of large intestine without perforation or abscess without bleeding     Past Surgical History  Procedure Laterality Date  . Appendectomy    . Abdominal hysterectomy    . Tonsillectomy and adenoidectomy  1935  . Cholecystectomy    . Breast biopsy Right 1971  . Mastectomy Right 1971  . Tubal ligation    . Cataract extraction w/ intraocular lens  implant, bilateral Bilateral   . Rectal polypectomy  early 2000's  . Laparoscopy N/A 06/14/2015    Procedure: LAPAROSCOPY DIAGNOSTIC;  Surgeon: [name], MD;  Location: [facility] ORS;  Service: General;  Laterality: N/A;  . Open partial hepatectomy [83] N/A 06/14/2015    Procedure:  OPEN PARTIAL RIGHT HEPATECTOMY;  Surgeon: [name], MD;  Location: [facility] ORS;  Service: General;  Laterality: N/A;  converted to open @ 1447  . Liver biopsy  06/14/2015    Procedure: LIVER BIOPSY;  Surgeon: [name], MD;  Location: [facility] ORS;  Service: General;;    Allergies: Review of patient's allergies indicates no known allergies.  Medications: Prior to Admission medications   Medication Sig Start Date End Date Taking? Authorizing Provider  acetaminophen (TYLENOL) 325 MG tablet Take 1-2 tablets (325-650 mg total) by mouth every 6 (six) hours as needed for fever, headache, mild pain or moderate pain. Patient taking differently: Take 325 mg by mouth every 6 (six) hours as needed for fever, headache, mild pain or moderate pain.  06/22/15   [name], MD  alum & mag hydroxide-simeth (MAALOX/MYLANTA) 200-200-20 MG/5ML suspension Take 30 mLs by mouth every 6 (six) hours  as needed for indigestion or heartburn (or bloating). 06/22/15   [name], MD  amoxicillin-clavulanate (AUGMENTIN) 250-62.5 MG/5ML suspension Take 17.5 mLs (875 mg total) by mouth 2 (two) times daily. 07/11/15   Verlee Monte, MD  aspirin 81 MG tablet Take 81 mg by mouth daily.    Historical Provider, MD    doxycycline (DORYX) 100 MG EC tablet Take 100 mg by mouth. Take one tablet for 7 days    Historical Provider, MD  HYDROcodone-acetaminophen (NORCO/VICODIN) 5-325 MG tablet Take 1 tablet by mouth every 6 (six) hours as needed for moderate pain. 07/11/15   Verlee Monte, MD  levothyroxine (SYNTHROID, LEVOTHROID) 100 MCG tablet TAKE ONE TABLET BY MOUTH 30 MINUTES BEFORE BREAKFAST EVERY MORNING. SEPARATE FROM OTHER MEDICATIONS FOR THYROID. 12/20/14   [name] L [name], DO  LORazepam (ATIVAN) 0.5 MG tablet Take 1 tablet (0.5 mg total) by mouth every 6 (six) hours as needed for anxiety. Patient taking differently: Take 0.5 mg by mouth every 4 (four) hours as needed for anxiety. Take one tablet at bedtime 07/11/15   Verlee Monte, MD  losartan (COZAAR) 50 MG tablet Take one tablet by mouth once daily for blood pressure Patient taking differently: Take 50 mg by mouth daily. Take one tablet by mouth once daily for blood pressure 02/28/15   [name] L [name], DO  ondansetron (ZOFRAN) 4 MG tablet Take 1 tablet (4 mg total) by mouth every 8 (eight) hours as needed for nausea or vomiting. 05/09/15   [name] L [name], DO  OVER THE COUNTER MEDICATION Take 1 Container by mouth 3 (three) times daily. Med pass    Historical Provider, MD  pantoprazole (PROTONIX) 40 MG tablet Take 1 tablet (40 mg total) by mouth daily. 07/11/15   Verlee Monte, MD  polyethylene glycol (MIRALAX / GLYCOLAX) packet Take 17 g by mouth daily.     Historical Provider, MD  Probiotic Product (PROBIOTIC DAILY PO) Take 1 tablet by mouth daily.     Historical Provider, MD  shark liver oil-cocoa butter (PREPARATION H) 0.25-3-85.5 % suppository Place 1 suppository rectally as needed for hemorrhoids.    Historical Provider, MD  simethicone (MYLICON) 161 MG chewable tablet Chew 1 tablet (125 mg total) by mouth 3 (three) times daily before meals. 07/11/15   Verlee Monte, MD  traMADol (ULTRAM) 50 MG tablet Take 1 tablet (50 mg total) by mouth every 6 (six) hours as  needed for moderate pain or severe pain. 07/11/15   Verlee Monte, MD     Family History  Problem Relation Age of Onset  . Cancer Mother   . Hypertension Father     Social History   Social History  . Marital Status: Married    Spouse Name: N/A  . Number of Children: N/A  . Years of Education: N/A   Social History Main Topics  . Smoking status: Former Smoker -- 0.50 packs/day for 40 years    Types: Cigarettes    Quit date: 08/13/2012  . Smokeless tobacco: Never Used     Comment: "quit smoking cigarettes years ago; I was probably in my 75's"  . Alcohol Use: 3.0 oz/week    5 Glasses of wine per week     Comment: once per month   . Drug Use: No  . Sexual Activity: No   Other Topics Concern  . Not on file   Social History Narrative    ECOG Status: 2 - Symptomatic, <50% confined to bed  Review of Systems: A 12 point ROS  discussed and pertinent positives are indicated in the HPI above.  All other systems are negative.  Review of Systems  Vital Signs: There were no vitals taken for this visit.  Physical Exam  Mallampati Score:     Imaging: Ct Abdomen Pelvis W Contrast  08/17/2015  CLINICAL DATA:  History of right-sided hepatic resection for poorly differentiated adenocarcinoma complicated by development of a postoperative biloma. Patient underwent percutaneous drainage catheter placement 07/05/2015 with follow-up CT scans obtained on 07/20/2015 and 08/03/2015. Patient returns today for repeat CT scan of the abdomen and pelvis. Diligent output records have been of provided from patient's nursing facility and demonstrate no significant output from the percutaneous drainage catheter within the past several days. EXAM: CT ABDOMEN AND PELVIS WITH CONTRAST TECHNIQUE: Multidetector CT imaging of the abdomen and pelvis was performed using the standard protocol following bolus administration of intravenous contrast. CONTRAST:  1108m OMNIPAQUE IOHEXOL 300 MG/ML  SOLN COMPARISON:   Ultrasound-guided hepatic abscess drainage catheter placement - 07/05/2015 ; CT abdomen pelvis - 07/04/2015; 07/20/2015; 08/03/2015 FINDINGS: Unchanged positioning of the percutaneous drainage catheter within the caudal aspect of the right lobe of the liver with continued slight decrease in the amount of ill-defined surrounding hypo attenuating fluid collection, currently measuring 5.2 x 3.2 x 2.4 cm (as measured in greatest axial - image 18, series 2; coronal - image 52, series 602) dimensions respectively, previously, 6.2 x 3.7 x 2.7 cm. No new definable/drainable intrahepatic fluid collections. Re- demonstrated mild diffuse slightly nodular hepatic contour. Ill-defined punctate (approximately 0.5 cm) hypo attenuating lesion within the cranial aspect of the right lobe of the liver (image 13, series 2) is unchanged as it is an adjacent punctate granuloma. No ascites. There is symmetric enhancement of the bilateral kidneys. No definite renal stones this postcontrast examination. Bilateral subcentimeter hypoattenuating renal lesions too small to adequately characterize of favored to represent renal cyst. No urinary obstruction or perinephric stranding. Normal appearance of the bilateral adrenal glands and pancreas. Punctate granuloma within otherwise normal-appearing spleen. Grossly unchanged extensive colonic diverticulosis with rather diffuse wall thickening involving an approximately 12 cm length of sigmoid colon within the left lower abdominal quadrant. These findings are again associated with a minimal amount adjacent pericolonic stranding but without new identifiable/drainable fluid collection. The bowel is otherwise normal in course and caliber without wall thickening. No pneumoperitoneum, pneumatosis or portal venous gas. Large amount of eccentric mixed calcified and noncalcified atherosclerotic plaque within a normal caliber abdominal aorta. The major branch vessels of the abdominal aorta appear widely  patent on this non CTA examination. No bulky retroperitoneal, mesenteric, pelvic or inguinal lymphadenopathy. Post hysterectomy. Left-sided hypo attenuating adnexal lesion is grossly unchanged with dominant cystic component measuring approximately 3.8 x 2.2 cm with additional adjacent cystic component now appearing as 2 discrete cystic lesions with dominant component measuring approximately 1.3 cm and adjacent smaller cystic lesion measuring approximately 1.0 cm (all cystic lesions seen on image 58, series 2). No discrete right-sided adnexal lesions. Limited visualization of the lower thorax demonstrates slight increase in size of trace right-sided effusion with associated worsening right basilar dependent ground-glass atelectasis. Normal heart size. Coronary artery calcifications. Calcifications within the aortic valve annulus. Interval increase in now small pericardial effusion. No acute or aggressive osseous abnormalities. Re- demonstrated grade 1 anterolisthesis of L4 upon L5 without associated pars defects. Moderate to severe DDD of L4-L5 with disc space height loss, endplate irregularity and sclerosis. A well healed surgical incision is noted within the right upper abdominal quadrant. There  is a minimal amount of body wall anasarca. IMPRESSION: 1. Unchanged positioning of percutaneous drainage catheter with continued decrease in size of the now approximately 5.2 cm hepatic fluid collection, previously, 6.2 cm. No new definable/drainable fluid collections within the abdomen or pelvis. 2. Persistent near circumferential thickening involving an approximately 12 cm length of sigmoid colon again - again while favored to represent acute uncomplicated diverticulitis, further evaluation with colonoscopy (if not recently performed) is recommended to exclude the presence of an underlying mass. 3. Unchanged size and appearance of complex left adrenal cystic structure with dominant cystic component again measuring  approximately 3.8 cm in diameter. 4. Interval increase in now small pericardial effusion with associated mild diffuse body wall anasarca, nonspecific though could be seen in the setting of heart failure. Clinical correlation is advised. Electronically Signed   By: Sandi Mariscal M.D.   On: 08/17/2015 10:00   Ct Abdomen Pelvis W Contrast  08/03/2015  CLINICAL DATA:  Post right hepatic resection for poorly differentiated adenocarcinoma, complicated by development of postoperative biloma. Patient underwent percutaneous drainage catheter placement on 07/05/2015. Patient returns today to the interventional radiology department for repeat CT scan. EXAM: CT ABDOMEN AND PELVIS WITH CONTRAST TECHNIQUE: Multidetector CT imaging of the abdomen and pelvis was performed using the standard protocol following bolus administration of intravenous contrast. CONTRAST:  17m OMNIPAQUE IOHEXOL 300 MG/ML  SOLN COMPARISON:  CT abdomen pelvis - 07/04/2015; 07/20/2015; ultrasound-guided right perihepatic percutaneous drainage catheter placement - 07/05/2015 FINDINGS: Post right partial hepatectomy. Unchanged positioning of right sided perihepatic percutaneous drainage catheter placement. There has been interval decrease in the size of the residual fluid collection, now measuring approximately 6.2 x 3.7 x 2.7 cm, previously, 6.8 x 4.4 x 3.0 cm. There is a minimal amount of ill-defined stranding about the right upper abdominal quadrant without new definable / drainable intra-abdominal fluid collection. There is persistent mild nodularity of the hepatic contour. No discrete hyper enhancing hepatic lesions. Post cholecystectomy with unchanged mild dilatation of the common bile duct measuring 9 mm in greatest oblique coronal diameter (coronal image 34, series 602) and mild centralized intrahepatic biliary duct dilatation, likely sequela post cholecystectomy state. There is symmetric enhancement of the bilateral kidneys. No definite renal  stones this postcontrast examination. No discrete renal lesions. No urinary obstruction or perinephric stranding. Normal appearance of the bilateral adrenal glands and pancreas. Several punctate granuloma are seen within otherwise normal-appearing spleen. There is near circumferential wall thickening involving an approximately 12 cm segment of sigmoid colon within the left lower abdominal quadrant (representative coronal images 24, and 30, series 602, axial images 60 through 67, series 2). These findings are associated with a minimal amount of adjacent mesenteric stranding, unchanged to minimally worsened compared to the 07/20/2015 examination. There is a grossly unchanged adjacent peripherally calcified structure adjacent to the ventral aspect of the sigmoid colon which is favored to represent a peripherally calcified epiploic appendage. Moderate colonic stool burden without evidence of enteric obstruction. The bowel is otherwise normal in course and caliber. Normal appearance of the terminal ileum. The appendix is not visualized, however there is no pericecal inflammatory change. No pneumoperitoneum, pneumatosis or portal venous gas. Large amount of eccentric mixed calcified and noncalcified atherosclerotic plaque within a normal caliber abdominal aorta. The major branch vessels of the abdominal aorta appear patent on this non CTA examination. Scattered retroperitoneal lymph nodes are individually not enlarged by size criteria. No retroperitoneal, mesenteric, pelvic or inguinal lymphadenopathy. There is diffuse thickening of the urinary bladder wall,  potentially accentuated underdistention. No free fluid in the pelvic cul-de-sac. Complex multi septated cystic left adnexal structure is grossly unchanged in size with dominant cystic component measuring approximately 4.0 x 2.5 cm (image 63, series 2) and additional adjacent cystic component measuring approximately 1.4 x 2.7 cm. Post hysterectomy. No discrete adnexal  lesions. Limited visualization of the lower thorax demonstrates interval resolution of trace left-sided effusion with interval decrease in persistent trace right-sided pleural effusion. Minimal subsegmental atelectasis within the right costophrenic angle. No focal airspace opacities. Normal heart size. Trace amount of pericardial fluid, presumably physiologic. Coronary artery calcifications. No acute or aggressive osseous abnormalities. Unchanged grade 1 anterolisthesis of L4 upon L5 measuring approximately 9 mm in diameter without associated pars defects. Well-healed midline and right upper abdominal quadrant incision without evidence of hernia. Mild diffuse body wall anasarca. IMPRESSION: 1. Slight interval decrease in size of residual approximately 6.2 cm perihepatic fluid collection, previously, 6.8 cm. 2. Near circumferential thickening involving an approximately 12 cm length of sigmoid colon within the left lower abdominal quadrant. While potentially representative of uncomplicated acute diverticulitis, an underlying discrete colonic malignancy is not excluded on the basis of this examination. Clinical correlation is advised. Further evaluation with colonoscopy (if not recently performed) may performed as clinically indicated. No evidence of perforation or definable/drainable fluid collection. 3. Diffuse thickening urinary bladder wall, presumably accentuated due to underdistention though cystitis could have a similar appearance. 4. Grossly unchanged size and appearance of complex left adnexal cystic structure with dominant cystic component measuring approximately 4 cm in diameter. Further evaluation with pelvic ultrasound is again recommended. Electronically Signed   By: Sandi Mariscal M.D.   On: 08/03/2015 15:24   Ct Abdomen Pelvis W Contrast  07/20/2015  CLINICAL DATA:  Status post percutaneous drainage of postoperative biloma at the margin of surgical resection of the right lobe of the liver status post  resection of adenocarcinoma of presumed liver origin. The drain was placed on 07/05/2015. Clinically there is some continued fluid output from the drain. Total amount of drainage is poorly documented at the patient's nursing facility. EXAM: CT ABDOMEN AND PELVIS WITH CONTRAST TECHNIQUE: Multidetector CT imaging of the abdomen and pelvis was performed using the standard protocol following bolus administration of intravenous contrast. CONTRAST:  145m ISOVUE-300 IOPAMIDOL (ISOVUE-300) INJECTION 61% COMPARISON:  07/04/2015 FINDINGS: A percutaneous drain is seen in the right upper quadrant with the pigtail portion of the drainage catheter along the resection margin of the posterior right lobe of the liver. Residual fluid collection measures approximately 6.8 x 4.5 x 3.0 cm and shows decrease in size since placement of the drainage catheter. The drainage catheter is well positioned in the medial aspect of the collection. The liver parenchyma enhances normally and there is no evidence of residual tumor. There is no intrahepatic biliary ductal dilatation. A small amount of free fluid is seen adjacent to the dome of the liver. There also is a small right pleural effusion. The pancreas, spleen, adrenal glands and kidneys are stable in appearance and unremarkable. The spleen contains a few calcified granulomata. The gallbladder has been removed. Bowel loops show no evidence of obstruction or inflammation. No masses or enlarged lymph nodes are seen. Cystic abnormality in the left adnexal region again noted which has enlarged slightly with dimensions of approximately 3.7 x 3.8 cm. Previously noted free fluid in the dependent pelvis has resolved. IMPRESSION: 1. Decrease in size of the perihepatic biloma after catheter drainage with residual fluid collection remaining. The drainage catheter is  well positioned in the medial aspect of the collection. No new fluid collections are seen. 2. Cystic changes in the left adnexal region  appears slightly larger. Eventual correlation with pelvic ultrasound is recommended. 3. A separate clinical evaluation was performed and is dictated separately in Epic. Electronically Signed   By: Aletta Edouard M.D.   On: 07/20/2015 15:10   Ir Sinus/fist Tube Chk-non Gi  08/03/2015  CLINICAL DATA:  Post right hepatic resection for poorly differentiated adenocarcinoma, complicated by development of postoperative biloma. Patient underwent percutaneous drainage catheter placement on 07/05/2015. Patient returns today to the interventional radiology department for repeat CT scan and percutaneous drainage catheter management. Preceding CT the abdomen pelvis performed earlier today demonstrates slight decrease in size of residual perihepatic fluid collection with dominant component measuring approximately 6.2 cm, previously, 6.8 cm. Patient now presents to the Interventional Radiology suite for percutaneous drainage catheter injection. The patient states the percutaneous drainage catheter has been flushed with 5-10 cc of saline at least once per day (note, the patient is a poor historian). The patient's nursing facility has provided a detailed list of recorded output from the percutaneous drainage catheter. Initially after being seen by Dr. Kathlene Cote at the interventional radiology drain Clinic on 07/20/2015, the percutaneous drainage catheter was putting out approximately 50 cc per day. The percutaneous drainage catheter has been putting out approximately 15 cc of fluid during the past 2-3 days. The fluid currently in the gravity bag is serous in appearance. The patient is without complaint. No fever or chills. No abdominal pain. EXAM: SINUS TRACT INJECTION/FISTULOGRAM COMPARISON:  CT abdomen pelvis - earlier same day; 07/20/2015; 07/04/2015; ultrasound-guided perihepatic percutaneous drainage catheter placement -07/05/2015 CONTRAST:  10 cc Omnipaque 300, administered via the existing perihepatic percutaneous  drainage catheter. FLUOROSCOPY TIME:  54 seconds (14 mGy) TECHNIQUE: The patient was positioned supine on the fluoroscopy table. A preprocedural spot fluoroscopic image was obtained of the right upper abdominal quadrant existing. Patent percutaneous drainage catheter. Multiple spot fluoroscopic and radiographic images were obtained in various obliquities following injection of a small amount of contrast via the existing percutaneous drainage catheter. Images were reviewed and the procedure was terminated. The percutaneous drainage catheter was reconnected to a gravity bag. A dressing was placed. The patient tolerated the procedure well without immediate postprocedural complication. FINDINGS: Preprocedural spot fluoroscopic image demonstrates unchanged positioning of the perihepatic percutaneous drainage catheter with and coiled and locked within the right upper abdominal quadrant. Contrast injection demonstrates opacification of the residual though largely decompressed perihepatic abscess cavity. There is no definitive communication of the abscess cavity and the adjacent biliary system. There is minimal reflux of contrast along the drainage catheter track to the lateral surface of the remaining right lobe of the liver. IMPRESSION: Percutaneous drainage catheter injection demonstrates opacification of the residual though largely decompressed perihepatic abscess cavity. No definitive communication to the biliary system. PLAN: Given the persistent output from the percutaneous drainage catheter (currently about approximately 15 cc per day) as well as the only slightly decreased perihepatic fluid collection (currently measuring 6.2 cm in greatest diameter, previously, 6.8 cm), the decision was made to maintain the percutaneous drainage catheter in place for 2 more weeks. The patient was instructed to continue to record the output from the percutaneous drainage catheter. Though was instructed to no longer flush the drain.  Written instructions as such were provided to the patient's nursing facility. The patient will return to the interventional radiology drain Clinic on during the week of 08/16/2015 for IV  only abdominal CT as well as potential repeat percutaneous drainage catheter injection. Hopefully, the percutaneous drainage catheter may be removed at that time. The patient was encouraged to keep her follow-up appointment with Dr. Barry Dienes scheduled for next week. Electronically Signed   By: Sandi Mariscal M.D.   On: 08/03/2015 15:33    Labs:  CBC:  Recent Labs  07/05/15 0415 07/06/15 0428 07/07/15 0530 07/09/15 0500 07/18/15 0926  WBC 14.5* 13.3* 12.6* 11.1* 8.9  HGB 11.0* 10.7* 9.5* 10.1*  --   HCT 33.5* 32.6* 28.7* 30.7* 33.3*  PLT 302 269 219 215  --     COAGS:  Recent Labs  05/04/15 1223 06/13/15 0830 06/15/15 0510 07/05/15 0415  INR 1.20 1.08  --  1.13  APTT  --   --  28  --     BMP:  Recent Labs  07/07/15 0530 07/08/15 0425 07/09/15 0500 07/18/15 0926  NA 138 136 132* 138  K 3.3* 3.7 3.6 3.5  CL 108 105 101 98  CO2 '26 25 26 25  '$ GLUCOSE 136* 128* 130* 132*  BUN '8 9 9 '$ 6*  CALCIUM 8.6* 8.7* 8.6* 9.9  CREATININE 0.74 0.88 0.85 0.92  GFRNONAA >60 57* 60* 56*  GFRAA >60 >60 >60 65    LIVER FUNCTION TESTS:  Recent Labs  06/23/15 0850 07/02/15 0026 07/07/15 0530 07/18/15 0926  BILITOT 1.1 0.6 0.5 0.3  AST 22 24 14* 21  ALT 45 17 13* 11  ALKPHOS 90 84 66 92  PROT 6.6 6.9 5.1* 6.4  ALBUMIN 3.1* 3.0* 2.0* 2.9*    TUMOR MARKERS:  Recent Labs  05/18/15 1019  AFPTM 6.5*  CEA <0.5  CA199 175.4*    Assessment and Plan:  Review of CT scan performed earlier today demonstrates continued reduction in size of residual intra/perihepatic fluid collection within the caudal aspect of the right lobe of the liver with dominant component measuring 5.2 cm in diameter, previously, 6.2 cm. No new definable/interval fluid collections.  There is a persistent long segment bowel  wall thickening involving the sigmoid colon at the location of extensive diverticular disease. No evidence of enteric obstruction.  Of note, drainage catheter injection was performed on 08/03/2015 was negative for the presence of a definitive communication to the adjacent biliary system.  The patient is asymptomatic with no output from the percutaneous drainage catheter for the past 3 days.   Above findings were discussed with referring surgeon, Dr. Barry Dienes, and the decision was made to remove the percutaneous drainage catheter.  This was done so at the patient's bedside without incident.  A dressing was placed.  The patient was encouraged to keep all of her subsequent follow up appointments with Dr. Barry Dienes.  A copy of this report was sent to the requesting provider on this date.   SignedSandi Mariscal 08/17/2015, 10:05 AM   I spent a total of 10 Minutes in face to face in clinical consultation, greater than 50% of which was counseling/coordinating care for percutaneous drainage catheter management

## 2015-08-22 ENCOUNTER — Ambulatory Visit: Payer: Medicare Other | Admitting: Internal Medicine

## 2015-08-22 ENCOUNTER — Encounter: Payer: Self-pay | Admitting: Internal Medicine

## 2015-08-22 ENCOUNTER — Other Ambulatory Visit: Payer: Self-pay

## 2015-08-22 ENCOUNTER — Ambulatory Visit (INDEPENDENT_AMBULATORY_CARE_PROVIDER_SITE_OTHER): Payer: Medicare Other | Admitting: Internal Medicine

## 2015-08-22 VITALS — BP 120/70 | HR 84 | Temp 98.2°F | Ht 63.0 in | Wt 135.0 lb

## 2015-08-22 DIAGNOSIS — F05 Delirium due to known physiological condition: Secondary | ICD-10-CM

## 2015-08-22 DIAGNOSIS — C229 Malignant neoplasm of liver, not specified as primary or secondary: Secondary | ICD-10-CM

## 2015-08-22 DIAGNOSIS — T8189XD Other complications of procedures, not elsewhere classified, subsequent encounter: Secondary | ICD-10-CM

## 2015-08-22 DIAGNOSIS — G3184 Mild cognitive impairment, so stated: Secondary | ICD-10-CM | POA: Diagnosis not present

## 2015-08-22 DIAGNOSIS — N12 Tubulo-interstitial nephritis, not specified as acute or chronic: Secondary | ICD-10-CM

## 2015-08-22 DIAGNOSIS — K668 Other specified disorders of peritoneum: Secondary | ICD-10-CM | POA: Diagnosis not present

## 2015-08-22 DIAGNOSIS — R410 Disorientation, unspecified: Secondary | ICD-10-CM

## 2015-08-22 DIAGNOSIS — N179 Acute kidney failure, unspecified: Secondary | ICD-10-CM

## 2015-08-22 DIAGNOSIS — N189 Chronic kidney disease, unspecified: Secondary | ICD-10-CM

## 2015-08-22 NOTE — Progress Notes (Signed)
Patient ID: [name], female   DOB: 1925/11/14, 82 y.o.   MRN: [id number]   Location: [facility] Provider: Rexene Edison. Mariea Clonts, D.O., C.M.D.  Code Status: DNR Goals of Care: Advanced Directive information Does patient have an advance directive?: Yes, Type of Advance Directive: Healthcare Power of Attorney  Chief Complaint  Patient presents with  . Hospitalization Follow-up    In the hospital from 07/01/15 to 07/11/15 for pyelonephritis.  Here with daughter [name]  . Acute Visit    FL2 completion to see if WS AL appropriate    HPI: Patient is a 82 y.o. female seen in the office today for hospital f/u s/p admission for pyelonephritis and rehab stay at [facility] through Fri, 1/6//17.  Records reviewed from hospitalization as well as her records from [facility] from Dr. Philip Aspen.  She and her husband are now seeking long term living at [facility] in assisted living and they have requested FL-2 form be completed and MMSE today to assess if Mrs. Najjar is AL appropriate.  Did not tolerate antidepressant that was added at SNF and her ammonia levels increased so it was stopped. Came home Friday.  Says she's been ok at home.  Hasn't been home since 11/1 and can't find things now in her house.  Her daughter has been helping out.  She's not cooking for herself except some eggs.  Has trouble getting foods down.  Francee Piccolo has been heating things up that their daughter has brought over.  She is drinking water.  Has put on 2 lbs since discharge 133 to 135lbs.  Food at snf was bad and mostly southern--not her type.  Got herself bathed, dressed, groomed herself.  Has a walker, but walking without it at home now.  Balance feels pretty much ok.  Remains a little more confused than she had been.  Unable to use her computer to play games with a friend.  Technology more challenging.  She is not reading either right now.  Still doing crosswords daily.  Difficulty with home phone but still able to use her own cell  phone.  Pre-surgery she was able to sort all of those things out.  Vision is pretty good.  Hears well.  She did not do very well with the ativan in the long run either.    Still having difficulty with constipation.  Thinks it's been 4-5 days, but has not eaten a lot.  Sees Dr. Nicole Cella has "urpy" thing--much less than she did have when fluid was behind her liver.  Almost a projectile vomiting.  Had swallow study, must sit up after eating, remains on protonix and probiotic.   Drain out a week ago Wed and finally scabbed over.  Last seepage sat from RUQ drain wound.  Now using bandaid.  Review of Systems:  Review of Systems  Constitutional: Positive for malaise/fatigue. Negative for fever and chills.  HENT: Negative for congestion and hearing loss.   Eyes: Negative for blurred vision.  Respiratory: Negative for shortness of breath.   Cardiovascular: Negative for chest pain and leg swelling.  Gastrointestinal: Positive for nausea, abdominal pain and constipation. Negative for diarrhea, blood in stool and melena.       Sometimes still some pain in RUQ at night; intake still poor and has burping at times  Genitourinary: Positive for urgency. Negative for dysuria.       Some leakage and urge incontinence  Musculoskeletal: Positive for joint pain. Negative for falls.  Skin: Negative for itching and rash.  Neurological:  Negative for dizziness and weakness.  Endo/Heme/Allergies: Does not bruise/bleed easily.  Psychiatric/Behavioral: Positive for memory loss. The patient does not have insomnia.        Anxiety improved and she's doing better mentally    Past Medical History  Diagnosis Date  . Hypertension   . Hyperlipidemia   . Thyroid disease   . Subacute delirium   . Essential and other specified forms of tremor   . Unspecified hypothyroidism   . Diverticulosis of colon (without mention of hemorrhage)   . Unspecified constipation   . Anal and rectal polyp   . Disorder of bone and  cartilage, unspecified   . Pneumonia X 1  . GERD (gastroesophageal reflux disease)   . Malignant neoplasm of breast (female), unspecified site   . Breast cancer, right breast ([place]) 1971    "never had chemo or radiation"  . Liver cancer ([place]) 2016  . Diverticulitis of large intestine without perforation or abscess without bleeding     Past Surgical History  Procedure Laterality Date  . Appendectomy    . Abdominal hysterectomy    . Tonsillectomy and adenoidectomy  1935  . Cholecystectomy    . Breast biopsy Right 1971  . Mastectomy Right 1971  . Tubal ligation    . Cataract extraction w/ intraocular lens  implant, bilateral Bilateral   . Rectal polypectomy  early 2000's  . Laparoscopy N/A 06/14/2015    Procedure: LAPAROSCOPY DIAGNOSTIC;  Surgeon: [name], MD;  Location: [facility];  Service: General;  Laterality: N/A;  . Open partial hepatectomy [83] N/A 06/14/2015    Procedure:  OPEN PARTIAL RIGHT HEPATECTOMY;  Surgeon: [name], MD;  Location: [facility];  Service: General;  Laterality: N/A;  converted to open @ 1447  . Liver biopsy  06/14/2015    Procedure: LIVER BIOPSY;  Surgeon: [name], MD;  Location: [facility];  Service: General;;    No Known Allergies    Medication List       This list is accurate as of: 08/22/15 11:52 AM.  Always use your most recent med list.               acetaminophen 325 MG tablet  Commonly known as:  TYLENOL  Take 1-2 tablets (325-650 mg total) by mouth every 6 (six) hours as needed for fever, headache, mild pain or moderate pain.     aspirin 81 MG tablet  Take 81 mg by mouth daily.     levothyroxine 100 MCG tablet  Commonly known as:  SYNTHROID, LEVOTHROID  TAKE ONE TABLET BY MOUTH 30 MINUTES BEFORE BREAKFAST EVERY MORNING. SEPARATE FROM OTHER MEDICATIONS FOR THYROID.     losartan 50 MG tablet  Commonly known as:  COZAAR  Take one tablet by mouth once daily for blood pressure     ondansetron 4 MG tablet  Commonly known as:  ZOFRAN    Take 1 tablet (4 mg total) by mouth every 8 (eight) hours as needed for nausea or vomiting.     OVER THE COUNTER MEDICATION  Take 1 Container by mouth 3 (three) times daily. Med pass     pantoprazole 40 MG tablet  Commonly known as:  PROTONIX  Take 1 tablet (40 mg total) by mouth daily.     polyethylene glycol packet  Commonly known as:  MIRALAX / GLYCOLAX  Take 17 g by mouth daily.     PROBIOTIC DAILY PO  Take 1 tablet by mouth daily.     shark liver  oil-cocoa butter 0.25-3-85.5 % suppository  Commonly known as:  PREPARATION H  Place 1 suppository rectally as needed for hemorrhoids.     simethicone 125 MG chewable tablet  Commonly known as:  MYLICON  Chew 1 tablet (125 mg total) by mouth 3 (three) times daily before meals.        Health Maintenance  Topic Date Due  . FOOT EXAM  03/13/1936  . OPHTHALMOLOGY EXAM  [birth year]13/1938  . TETANUS/TDAP  03/14/1947  . ZOSTAVAX  03/13/1988  . HEMOGLOBIN A1C  09/30/2015  . INFLUENZA VACCINE  03/13/2016  . DEXA SCAN  Completed  . PNA vac Low Risk Adult  Completed    Physical Exam: Filed Vitals:   08/22/15 1138  BP: 120/70  Pulse: 84  Temp: 98.2 F (36.8 C)  TempSrc: Oral  Height: '5\' 3"'$  (1.6 m)  Weight: 135 lb (61.236 kg)  SpO2: 97%   Body mass index is 23.92 kg/(m^2). Physical Exam  Constitutional: No distress.  Cardiovascular: Normal rate, regular rhythm, normal heart sounds and intact distal pulses.   Pulmonary/Chest: Effort normal and breath sounds normal. No respiratory distress.  Abdominal: Soft. Bowel sounds are normal. She exhibits no distension and no mass. There is tenderness. There is no rebound and no guarding. No hernia.  RUQ with bandaid over liver region, no drainage or erythema anymore  Musculoskeletal: Normal range of motion. She exhibits no edema or tenderness.  Walking w/o assistive device today  Neurological: She is alert.  Oriented to person, place and time except exact date  Skin: Skin is warm and  dry.  Psychiatric: She has a normal mood and affect.    Labs reviewed: Basic Metabolic Panel:  Recent Labs  09/20/14 0816 03/30/15 0813  06/15/15 0510  07/07/15 0530 07/08/15 0425 07/09/15 0500 07/18/15 0926  NA 139 139  < > 134*  < > 138 136 132* 138  K 4.2 3.9  < > 4.6  < > 3.3* 3.7 3.6 3.5  CL 100 98  < > 103  < > 108 105 101 98  CO2 23 22  < > 24  < > '26 25 26 25  '$ GLUCOSE 132* 119*  < > 258*  < > 136* 128* 130* 132*  BUN 18 18  < > 19  < > '8 9 9 '$ 6*  CREATININE 1.23* 1.24*  < > 1.54*  < > 0.74 0.88 0.85 0.92  CALCIUM 10.8* 9.8  < > 9.0  < > 8.6* 8.7* 8.6* 9.9  MG  --   --   --  1.5*  --  1.5* 1.9  --   --   PHOS  --   --   < > 3.7  --  1.8* 2.4* 2.6  --   TSH 1.100 1.640  --   --   --   --   --   --  1.410  < > = values in this interval not displayed. Liver Function Tests:  Recent Labs  07/02/15 0026 07/07/15 0530 07/18/15 0926  AST 24 14* 21  ALT 17 13* 11  ALKPHOS 84 66 92  BILITOT 0.6 0.5 0.3  PROT 6.9 5.1* 6.4  ALBUMIN 3.0* 2.0* 2.9*    Recent Labs  06/18/15 1126  LIPASE 27    Recent Labs  06/18/15 1126 06/19/15 0410  AMMONIA 19 20   CBC:  Recent Labs  07/02/15 0026  07/06/15 0428 07/07/15 0530 07/09/15 0500 07/18/15 0926  WBC 14.5*  < > 13.3* 12.6*  11.1* 8.9  NEUTROABS 10.8*  --   --  9.0*  --  6.6  HGB 11.4*  < > 10.7* 9.5* 10.1*  --   HCT 33.9*  < > 32.6* 28.7* 30.7* 33.3*  MCV 84.5  < > 86.0 84.2 84.3 85  PLT 386  < > 269 219 215 306  < > = values in this interval not displayed. Lipid Panel:  Recent Labs  09/20/14 0816 03/30/15 0813 07/07/15 0545  CHOL 174 264*  --   HDL 73 76  --   LDLCALC 77 154*  --   TRIG 118 171* 83  CHOLHDL 2.4 3.5  --    Lab Results  Component Value Date   HGBA1C 6.6* 03/30/2015    Procedures since last visit: Ct Abdomen Pelvis W Contrast  08/17/2015  CLINICAL DATA:  History of right-sided hepatic resection for poorly differentiated adenocarcinoma complicated by development of a postoperative  biloma. Patient underwent percutaneous drainage catheter placement 07/05/2015 with follow-up CT scans obtained on 07/20/2015 and 08/03/2015. Patient returns today for repeat CT scan of the abdomen and pelvis. Diligent output records have been of provided from patient's nursing facility and demonstrate no significant output from the percutaneous drainage catheter within the past several days. EXAM: CT ABDOMEN AND PELVIS WITH CONTRAST TECHNIQUE: Multidetector CT imaging of the abdomen and pelvis was performed using the standard protocol following bolus administration of intravenous contrast. CONTRAST:  169m OMNIPAQUE IOHEXOL 300 MG/ML  SOLN COMPARISON:  Ultrasound-guided hepatic abscess drainage catheter placement - 07/05/2015 ; CT abdomen pelvis - 07/04/2015; 07/20/2015; 08/03/2015 FINDINGS: Unchanged positioning of the percutaneous drainage catheter within the caudal aspect of the right lobe of the liver with continued slight decrease in the amount of ill-defined surrounding hypo attenuating fluid collection, currently measuring 5.2 x 3.2 x 2.4 cm (as measured in greatest axial - image 18, series 2; coronal - image 52, series 602) dimensions respectively, previously, 6.2 x 3.7 x 2.7 cm. No new definable/drainable intrahepatic fluid collections. Re- demonstrated mild diffuse slightly nodular hepatic contour. Ill-defined punctate (approximately 0.5 cm) hypo attenuating lesion within the cranial aspect of the right lobe of the liver (image 13, series 2) is unchanged as it is an adjacent punctate granuloma. No ascites. There is symmetric enhancement of the bilateral kidneys. No definite renal stones this postcontrast examination. Bilateral subcentimeter hypoattenuating renal lesions too small to adequately characterize of favored to represent renal cyst. No urinary obstruction or perinephric stranding. Normal appearance of the bilateral adrenal glands and pancreas. Punctate granuloma within otherwise normal-appearing  spleen. Grossly unchanged extensive colonic diverticulosis with rather diffuse wall thickening involving an approximately 12 cm length of sigmoid colon within the left lower abdominal quadrant. These findings are again associated with a minimal amount adjacent pericolonic stranding but without new identifiable/drainable fluid collection. The bowel is otherwise normal in course and caliber without wall thickening. No pneumoperitoneum, pneumatosis or portal venous gas. Large amount of eccentric mixed calcified and noncalcified atherosclerotic plaque within a normal caliber abdominal aorta. The major branch vessels of the abdominal aorta appear widely patent on this non CTA examination. No bulky retroperitoneal, mesenteric, pelvic or inguinal lymphadenopathy. Post hysterectomy. Left-sided hypo attenuating adnexal lesion is grossly unchanged with dominant cystic component measuring approximately 3.8 x 2.2 cm with additional adjacent cystic component now appearing as 2 discrete cystic lesions with dominant component measuring approximately 1.3 cm and adjacent smaller cystic lesion measuring approximately 1.0 cm (all cystic lesions seen on image 58, series 2). No discrete right-sided adnexal  lesions. Limited visualization of the lower thorax demonstrates slight increase in size of trace right-sided effusion with associated worsening right basilar dependent ground-glass atelectasis. Normal heart size. Coronary artery calcifications. Calcifications within the aortic valve annulus. Interval increase in now small pericardial effusion. No acute or aggressive osseous abnormalities. Re- demonstrated grade 1 anterolisthesis of L4 upon L5 without associated pars defects. Moderate to severe DDD of L4-L5 with disc space height loss, endplate irregularity and sclerosis. A well healed surgical incision is noted within the right upper abdominal quadrant. There is a minimal amount of body wall anasarca. IMPRESSION: 1. Unchanged  positioning of percutaneous drainage catheter with continued decrease in size of the now approximately 5.2 cm hepatic fluid collection, previously, 6.2 cm. No new definable/drainable fluid collections within the abdomen or pelvis. 2. Persistent near circumferential thickening involving an approximately 12 cm length of sigmoid colon again - again while favored to represent acute uncomplicated diverticulitis, further evaluation with colonoscopy (if not recently performed) is recommended to exclude the presence of an underlying mass. 3. Unchanged size and appearance of complex left adrenal cystic structure with dominant cystic component again measuring approximately 3.8 cm in diameter. 4. Interval increase in now small pericardial effusion with associated mild diffuse body wall anasarca, nonspecific though could be seen in the setting of heart failure. Clinical correlation is advised. Electronically Signed   By: Sandi Mariscal M.D.   On: 08/17/2015 10:00   Ir Radiologist Eval & Mgmt  08/17/2015  CLINICAL DATA:  Postprocedural hepatic biloma/abscess. EXAM: IR EVAL AND MANAGEMENT IMPRESSION: Please refer to full dictation in [facility] EMR. Electronically Signed   By: Sandi Mariscal M.D.   On: 08/17/2015 12:08   Assessment/Plan 1. Adenocarcinoma of liver s/p partial right hepatic lobectomy 06/14/2015 -doing better now, cont to monitor  2. Pyelonephritis -resolved s/p abx  3. Acute-on-chronic kidney injury ([place]) -doing better also--renal function improved as of 12/19 to 0.88  4. Biloma following surgery, subsequent encounter -drain removed wed a week ago and now with only a small bandaid at site -still with some mild tenderness, pain at night and poor intake, burping  5. Subacute delirium -seems this has resolved -she is doing better, but cognition still not completely as good as it was--she is having more difficulty with technology and preparing meals  6. Mild cognitive impairment with memory loss -this has  declined somewhat since liver surgery, infections, pain meds, anxiety meds--all of these are better now so this might be her baseline -last labs 12/19 back to normal with resolution of wbc, normalized Na and cr  Labs/tests ordered:  No new today; FL-2 form today Next appt:  2 mos as scheduled with labs before (if she moves by then, she should come to [facility] clinic instead and get labs drawn there in her room)  Deslyn Cavenaugh L. Naphtali Zywicki, D.O. [facility] [address], [place] [address] Cell Phone (Mon-Fri 8am-5pm):  [phone] On Call:  [phone] & follow prompts after 5pm & weekends Office Phone:  [phone] Office Fax:  [phone]

## 2015-08-23 ENCOUNTER — Other Ambulatory Visit: Payer: Medicare Other

## 2015-08-23 DIAGNOSIS — N183 Chronic kidney disease, stage 3 (moderate): Secondary | ICD-10-CM | POA: Diagnosis not present

## 2015-08-23 DIAGNOSIS — C229 Malignant neoplasm of liver, not specified as primary or secondary: Secondary | ICD-10-CM | POA: Diagnosis not present

## 2015-08-23 DIAGNOSIS — Z87440 Personal history of urinary (tract) infections: Secondary | ICD-10-CM | POA: Diagnosis not present

## 2015-08-23 DIAGNOSIS — Z9011 Acquired absence of right breast and nipple: Secondary | ICD-10-CM | POA: Diagnosis not present

## 2015-08-23 DIAGNOSIS — Z87891 Personal history of nicotine dependence: Secondary | ICD-10-CM | POA: Diagnosis not present

## 2015-08-23 DIAGNOSIS — R1310 Dysphagia, unspecified: Secondary | ICD-10-CM | POA: Diagnosis not present

## 2015-08-23 DIAGNOSIS — R2681 Unsteadiness on feet: Secondary | ICD-10-CM | POA: Diagnosis not present

## 2015-08-23 DIAGNOSIS — E46 Unspecified protein-calorie malnutrition: Secondary | ICD-10-CM | POA: Diagnosis not present

## 2015-08-23 DIAGNOSIS — F419 Anxiety disorder, unspecified: Secondary | ICD-10-CM | POA: Diagnosis not present

## 2015-08-23 DIAGNOSIS — N12 Tubulo-interstitial nephritis, not specified as acute or chronic: Secondary | ICD-10-CM | POA: Diagnosis not present

## 2015-08-23 DIAGNOSIS — I129 Hypertensive chronic kidney disease with stage 1 through stage 4 chronic kidney disease, or unspecified chronic kidney disease: Secondary | ICD-10-CM | POA: Diagnosis not present

## 2015-08-23 DIAGNOSIS — R633 Feeding difficulties: Secondary | ICD-10-CM | POA: Diagnosis not present

## 2015-08-23 DIAGNOSIS — Z9089 Acquired absence of other organs: Secondary | ICD-10-CM | POA: Diagnosis not present

## 2015-08-23 DIAGNOSIS — R2689 Other abnormalities of gait and mobility: Secondary | ICD-10-CM | POA: Diagnosis not present

## 2015-08-24 DIAGNOSIS — R2681 Unsteadiness on feet: Secondary | ICD-10-CM | POA: Diagnosis not present

## 2015-08-24 DIAGNOSIS — C229 Malignant neoplasm of liver, not specified as primary or secondary: Secondary | ICD-10-CM | POA: Diagnosis not present

## 2015-08-24 DIAGNOSIS — N183 Chronic kidney disease, stage 3 (moderate): Secondary | ICD-10-CM | POA: Diagnosis not present

## 2015-08-24 DIAGNOSIS — R1310 Dysphagia, unspecified: Secondary | ICD-10-CM | POA: Diagnosis not present

## 2015-08-24 DIAGNOSIS — N12 Tubulo-interstitial nephritis, not specified as acute or chronic: Secondary | ICD-10-CM | POA: Diagnosis not present

## 2015-08-24 DIAGNOSIS — I129 Hypertensive chronic kidney disease with stage 1 through stage 4 chronic kidney disease, or unspecified chronic kidney disease: Secondary | ICD-10-CM | POA: Diagnosis not present

## 2015-08-25 DIAGNOSIS — C229 Malignant neoplasm of liver, not specified as primary or secondary: Secondary | ICD-10-CM | POA: Diagnosis not present

## 2015-08-25 DIAGNOSIS — N12 Tubulo-interstitial nephritis, not specified as acute or chronic: Secondary | ICD-10-CM | POA: Diagnosis not present

## 2015-08-25 DIAGNOSIS — I129 Hypertensive chronic kidney disease with stage 1 through stage 4 chronic kidney disease, or unspecified chronic kidney disease: Secondary | ICD-10-CM | POA: Diagnosis not present

## 2015-08-25 DIAGNOSIS — R2681 Unsteadiness on feet: Secondary | ICD-10-CM | POA: Diagnosis not present

## 2015-08-25 DIAGNOSIS — R1310 Dysphagia, unspecified: Secondary | ICD-10-CM | POA: Diagnosis not present

## 2015-08-25 DIAGNOSIS — N183 Chronic kidney disease, stage 3 (moderate): Secondary | ICD-10-CM | POA: Diagnosis not present

## 2015-08-26 ENCOUNTER — Other Ambulatory Visit: Payer: Self-pay

## 2015-08-26 ENCOUNTER — Encounter (HOSPITAL_COMMUNITY): Payer: Self-pay

## 2015-08-26 DIAGNOSIS — C229 Malignant neoplasm of liver, not specified as primary or secondary: Secondary | ICD-10-CM | POA: Diagnosis not present

## 2015-08-26 DIAGNOSIS — R2681 Unsteadiness on feet: Secondary | ICD-10-CM | POA: Diagnosis not present

## 2015-08-26 DIAGNOSIS — N12 Tubulo-interstitial nephritis, not specified as acute or chronic: Secondary | ICD-10-CM | POA: Diagnosis not present

## 2015-08-26 DIAGNOSIS — R1310 Dysphagia, unspecified: Secondary | ICD-10-CM | POA: Diagnosis not present

## 2015-08-26 DIAGNOSIS — N183 Chronic kidney disease, stage 3 (moderate): Secondary | ICD-10-CM | POA: Diagnosis not present

## 2015-08-26 DIAGNOSIS — I129 Hypertensive chronic kidney disease with stage 1 through stage 4 chronic kidney disease, or unspecified chronic kidney disease: Secondary | ICD-10-CM | POA: Diagnosis not present

## 2015-08-28 ENCOUNTER — Telehealth: Payer: Self-pay | Admitting: Hematology and Oncology

## 2015-08-28 NOTE — Telephone Encounter (Signed)
Patients daughter aware of follow up this week and was given a 51mn spot as no 388m avail

## 2015-08-29 ENCOUNTER — Telehealth: Payer: Self-pay | Admitting: *Deleted

## 2015-08-29 ENCOUNTER — Other Ambulatory Visit: Payer: Self-pay | Admitting: *Deleted

## 2015-08-29 NOTE — Telephone Encounter (Signed)
Patient daughter, Jerald Kief called and stated that her mom was placed in a nursing home and home now. Nursing home had placed her on Cozaar due to BP going up into the 170's. At that time when she was placed on the Cozaar she started getting confused. Daughter stated that she thinks she is not getting enough oxygen to the brain due to the drug. Daughter wants patient to come off of the Cozaar and have [facility] monitor her BP and if it starts going up again to go back on Spirolactone. Right now her Blood pressures have been running around 112-120. Getting ready to move to [facility] on 09/08/15. Please Advise.

## 2015-08-30 DIAGNOSIS — R2681 Unsteadiness on feet: Secondary | ICD-10-CM | POA: Diagnosis not present

## 2015-08-30 DIAGNOSIS — N183 Chronic kidney disease, stage 3 (moderate): Secondary | ICD-10-CM | POA: Diagnosis not present

## 2015-08-30 DIAGNOSIS — C229 Malignant neoplasm of liver, not specified as primary or secondary: Secondary | ICD-10-CM | POA: Diagnosis not present

## 2015-08-30 DIAGNOSIS — R1310 Dysphagia, unspecified: Secondary | ICD-10-CM | POA: Diagnosis not present

## 2015-08-30 DIAGNOSIS — I129 Hypertensive chronic kidney disease with stage 1 through stage 4 chronic kidney disease, or unspecified chronic kidney disease: Secondary | ICD-10-CM | POA: Diagnosis not present

## 2015-08-30 DIAGNOSIS — N12 Tubulo-interstitial nephritis, not specified as acute or chronic: Secondary | ICD-10-CM | POA: Diagnosis not present

## 2015-08-31 ENCOUNTER — Telehealth: Payer: Self-pay | Admitting: Hematology and Oncology

## 2015-08-31 ENCOUNTER — Ambulatory Visit (HOSPITAL_BASED_OUTPATIENT_CLINIC_OR_DEPARTMENT_OTHER): Payer: Medicare Other | Admitting: Hematology and Oncology

## 2015-08-31 ENCOUNTER — Encounter: Payer: Self-pay | Admitting: Hematology and Oncology

## 2015-08-31 DIAGNOSIS — C229 Malignant neoplasm of liver, not specified as primary or secondary: Secondary | ICD-10-CM

## 2015-08-31 DIAGNOSIS — R2681 Unsteadiness on feet: Secondary | ICD-10-CM | POA: Diagnosis not present

## 2015-08-31 DIAGNOSIS — I129 Hypertensive chronic kidney disease with stage 1 through stage 4 chronic kidney disease, or unspecified chronic kidney disease: Secondary | ICD-10-CM | POA: Diagnosis not present

## 2015-08-31 DIAGNOSIS — N183 Chronic kidney disease, stage 3 (moderate): Secondary | ICD-10-CM | POA: Diagnosis not present

## 2015-08-31 DIAGNOSIS — R1310 Dysphagia, unspecified: Secondary | ICD-10-CM | POA: Diagnosis not present

## 2015-08-31 DIAGNOSIS — N12 Tubulo-interstitial nephritis, not specified as acute or chronic: Secondary | ICD-10-CM | POA: Diagnosis not present

## 2015-08-31 NOTE — Telephone Encounter (Signed)
Appointments made and avs given.

## 2015-08-31 NOTE — Telephone Encounter (Signed)
I don't think her confusion is coming from cozaar.  Cont as bp is well controlled.

## 2015-08-31 NOTE — Telephone Encounter (Signed)
Patient daughter [name] notified and agreed.

## 2015-08-31 NOTE — Progress Notes (Signed)
Patient Care Team: Gayland Curry, DO as PCP - General (Geriatric Medicine)  DIAGNOSIS: No matching staging information was found for the patient.  SUMMARY OF ONCOLOGIC HISTORY:   Adenocarcinoma of liver s/p partial right hepatic lobectomy 06/14/2015   05/03/2015 Imaging CT abdomen: Bilobed 8.1 cm mass in the liver segment 5. 3.8 cm fluid collection posterior and medial: Favoring adnexal cyst   05/04/2015 Initial Diagnosis Poorly differentiated carcinoma strongly positive for CK 8/18, CK 7 and MOC-31; HPAR faint cytoplasmic staining; (negative for ER, PR, AFP, TTF-1, WT 1, p63, CD10, CDX 2, CK 20, CK 5/6, mucicarmine)   05/17/2015 PET scan Hypermetabolic partially necrotic right liver lobe mass , hypermetabolic some within the descending/sigmoid colon junction in the region of inflammation/ diverticulitis versus cancer   06/14/2015 Surgery liver partial resection segment 7 and 8: Poorly differentiated carcinoma 8.6 cm, margins negative,positive for ck8/18, ck 7, partially positive for ck20, polyclonalCEA, cd10 and negative for HEPAR, AFP, Monoclonal CEA, WT-1, cdx-2, ER, PR, GCDFP and TTF   06/14/2015 Procedure cancer type ID: 96% pancreaticobiliary origin, 60% cholangiocarcinoma, 36% gallbladder adenocarcinoma    CHIEF COMPLIANT: Follow-up after right hepatectomy  INTERVAL HISTORY: Destiny Velasquez is a 82 year old with above-mentioned history of solitary liver lesion that was resected by Dr. Barry Dienes on 06/14/2015. She had multiple postoperative complications requiring hospitalizations and finally her drains have come out a few weeks ago. She has been discharged home for the past 2 weeks. Her family reports that her strength has improved slowly over time. She however requires walker to go over long distances. She is able to manage without the walker at home. Her oral intake of food has also not been great. She generally does not find any food appealing.  REVIEW OF SYSTEMS:   Constitutional: Denies  fevers, chills or abnormal weight loss Eyes: Denies blurriness of vision Ears, nose, mouth, throat, and face: Denies mucositis or sore throat Respiratory: Denies cough, dyspnea or wheezes Cardiovascular: Denies palpitation, chest discomfort Gastrointestinal:  Abdominal discomfort from prior surgery Skin: Denies abnormal skin rashes Lymphatics: Denies new lymphadenopathy or easy bruising Neurological:Denies numbness, tingling or new weaknesses Behavioral/Psych: Mood is stable, no new changes  Extremities: No lower extremity edema All other systems were reviewed with the patient and are negative.  I have reviewed the past medical history, past surgical history, social history and family history with the patient and they are unchanged from previous note.  ALLERGIES:  has No Known Allergies.  MEDICATIONS:  Current Outpatient Prescriptions  Medication Sig Dispense Refill  . acetaminophen (TYLENOL) 325 MG tablet Take 1-2 tablets (325-650 mg total) by mouth every 6 (six) hours as needed for fever, headache, mild pain or moderate pain. (Patient taking differently: Take 325 mg by mouth every 6 (six) hours as needed for fever, headache, mild pain or moderate pain. ) 50 tablet 0  . aspirin 81 MG tablet Take 81 mg by mouth daily.    Marland Kitchen levothyroxine (SYNTHROID, LEVOTHROID) 100 MCG tablet TAKE ONE TABLET BY MOUTH 30 MINUTES BEFORE BREAKFAST EVERY MORNING. SEPARATE FROM OTHER MEDICATIONS FOR THYROID. 60 tablet 5  . losartan (COZAAR) 50 MG tablet Take one tablet by mouth once daily for blood pressure (Patient taking differently: Take 50 mg by mouth daily. Take one tablet by mouth once daily for blood pressure) 90 tablet 1  . ondansetron (ZOFRAN) 4 MG tablet Take 1 tablet (4 mg total) by mouth every 8 (eight) hours as needed for nausea or vomiting. 30 tablet 0  . OVER THE  COUNTER MEDICATION Take 1 Container by mouth 3 (three) times daily. Med pass    . pantoprazole (PROTONIX) 40 MG tablet Take 1 tablet (40  mg total) by mouth daily.    . polyethylene glycol (MIRALAX / GLYCOLAX) packet Take 17 g by mouth daily.     . Probiotic Product (PROBIOTIC DAILY PO) Take 1 tablet by mouth daily.     . shark liver oil-cocoa butter (PREPARATION H) 0.25-3-85.5 % suppository Place 1 suppository rectally as needed for hemorrhoids.    . simethicone (MYLICON) 937 MG chewable tablet Chew 1 tablet (125 mg total) by mouth 3 (three) times daily before meals. 30 tablet 0   No current facility-administered medications for this visit.    PHYSICAL EXAMINATION: ECOG PERFORMANCE STATUS: 1 - Symptomatic but completely ambulatory  Filed Vitals:   08/31/15 1102  BP: 130/65  Pulse: 80  Temp: 98 F (36.7 C)  Resp: 18   Filed Weights   08/31/15 1102  Weight: 135 lb 6.4 oz (61.417 kg)    GENERAL:alert, no distress and comfortable SKIN: skin color, texture, turgor are normal, no rashes or significant lesions EYES: normal, Conjunctiva are pink and non-injected, sclera clear OROPHARYNX:no exudate, no erythema and lips, buccal mucosa, and tongue normal  NECK: supple, thyroid normal size, non-tender, without nodularity LYMPH:  no palpable lymphadenopathy in the cervical, axillary or inguinal LUNGS: clear to auscultation and percussion with normal breathing effort HEART: regular rate & rhythm and no murmurs and no lower extremity edema ABDOMEN: Scars from recent surgery MUSCULOSKELETAL:no cyanosis of digits and no clubbing  NEURO: alert & oriented x 3 with fluent speech, no focal motor/sensory deficits EXTREMITIES: No lower extremity edema   LABORATORY DATA:  I have reviewed the data as listed   Chemistry      Component Value Date/Time   NA 137 08/01/2015   NA 132* 07/09/2015 0500   K 3.1* 08/01/2015   CL 98 07/18/2015 0926   CO2 25 07/18/2015 0926   BUN 6* 07/18/2015 0926   BUN 9 07/09/2015 0500   CREATININE 0.9 08/01/2015   CREATININE 0.92 07/18/2015 0926   GLU 148 08/01/2015      Component Value  Date/Time   CALCIUM 9.9 07/18/2015 0926   ALKPHOS 92 07/18/2015 0926   AST 21 07/18/2015 0926   ALT 11 07/18/2015 0926   BILITOT 0.3 07/18/2015 0926   BILITOT 0.5 07/07/2015 0530       Lab Results  Component Value Date   WBC 8.2 08/01/2015   HGB 11.0* 08/01/2015   HCT 34* 08/01/2015   MCV 85 07/18/2015   PLT 304 08/01/2015   NEUTROABS 6.6 07/18/2015     ASSESSMENT & PLAN:  Adenocarcinoma of liver s/p partial right hepatic lobectomy 06/14/2015 Liver partial resection segment 7 and 8 06/14/2015: Poorly differentiated carcinoma 8.6 cm, margins negative,positive for ck8/18, ck 7, partially positive for ck20, polyclonalCEA, cd10 and negative for HEPAR, AFP, Monoclonal CEA, WT-1, cdx-2, ER, PR, GCDFP and TTF-1) Cancer type ID: 96% pancreaticobiliary origin, 60% cholangiocarcinoma, 36% gallbladder adenocarcinoma  Pathology review: I discussed with the patient extensively the significance of the diagnosis of metastatic cholangiocarcinoma. Fortunately the margins are negative. I recommended that she undergo serial CT scans to monitor for disease relapse. Patient understands there is no chemotherapy that can cure her cancer. Given her frailty and her advanced age, I did not consider chemotherapy.  Recommendation: 1. Physical therapy and rehabilitation: I encouraged her to continue to get stronger and be able to walk on  her own. Currently she is using a walker for long distances but is able to walk around her house. I tried to encourage her to do more activity and continue to push to get stronger. She is moving into a retirement community along with her husband. 2. Encouraged her to take a high protein diet and we discussed this at length. 3. Forgetfulness: Patient may have had a small stroke during the hospitalization. I discussed with her that over time all of her hiatal mental functions could also get better. I encouraged to continue to try to read books as she was previous to this.  I will  discuss with Dr. Barry Dienes when her next CT scan will be scheduled. RTC in 3 months   Orders Placed This Encounter  Procedures  . CBC with Differential    Standing Status: Future     Number of Occurrences:      Standing Expiration Date: 08/30/2016  . Comprehensive metabolic panel    Standing Status: Future     Number of Occurrences:      Standing Expiration Date: 08/30/2016  . CA 19.9    Standing Status: Future     Number of Occurrences:      Standing Expiration Date: 08/30/2016   The patient has a good understanding of the overall plan. she agrees with it. she will call with any problems that may develop before the next visit here.   Rulon Eisenmenger, MD 08/31/2015

## 2015-08-31 NOTE — Assessment & Plan Note (Signed)
Liver partial resection segment 7 and 8 06/14/2015: Poorly differentiated carcinoma 8.6 cm, margins negative,positive for ck8/18, ck 7, partially positive for ck20, polyclonalCEA, cd10 and negative for HEPAR, AFP, Monoclonal CEA, WT-1, cdx-2, ER, PR, GCDFP and TTF-1) Cancer type ID: 96% pancreaticobiliary origin, 60% cholangiocarcinoma, 36% gallbladder adenocarcinoma  Pathology review: I discussed with the patient extensively the significance of the diagnosis of metastatic cholangiocarcinoma. Fortunately the margins are negative. I recommended that she undergo serial CT scans to monitor for disease relapse. Patient understands there is no chemotherapy that can cure her cancer.  Recommendation: 1. Physical therapy and rehabilitation: I encouraged her to continue to get stronger and be able to walk on her own. Currently she is using a walker for long distances but is able to walk around her house. I tried to encourage her to do more activity and continue to push to get stronger. She is moving into a retirement community along with her husband. 2. Encouraged her to take a high protein diet and we discussed this at length. 3. Forgetfulness: Patient may have had a small stroke during the hospitalization. I discussed with her that over time all of her hiatal mental functions could also get better. I encouraged to continue to try to read books as she was previous to this.  I will discuss with Dr. Barry Dienes when her next CT scan will be scheduled. RTC in 3 months

## 2015-09-01 ENCOUNTER — Encounter: Payer: Self-pay | Admitting: *Deleted

## 2015-09-01 DIAGNOSIS — I129 Hypertensive chronic kidney disease with stage 1 through stage 4 chronic kidney disease, or unspecified chronic kidney disease: Secondary | ICD-10-CM | POA: Diagnosis not present

## 2015-09-01 DIAGNOSIS — R1310 Dysphagia, unspecified: Secondary | ICD-10-CM | POA: Diagnosis not present

## 2015-09-01 DIAGNOSIS — R2681 Unsteadiness on feet: Secondary | ICD-10-CM | POA: Diagnosis not present

## 2015-09-01 DIAGNOSIS — N12 Tubulo-interstitial nephritis, not specified as acute or chronic: Secondary | ICD-10-CM | POA: Diagnosis not present

## 2015-09-01 DIAGNOSIS — N183 Chronic kidney disease, stage 3 (moderate): Secondary | ICD-10-CM | POA: Diagnosis not present

## 2015-09-01 DIAGNOSIS — C229 Malignant neoplasm of liver, not specified as primary or secondary: Secondary | ICD-10-CM | POA: Diagnosis not present

## 2015-09-01 NOTE — Progress Notes (Signed)
Received cancer type ID from [facility], reviewed by Dr. Lindi Adie, sent to scan.

## 2015-09-02 DIAGNOSIS — N183 Chronic kidney disease, stage 3 (moderate): Secondary | ICD-10-CM | POA: Diagnosis not present

## 2015-09-02 DIAGNOSIS — C229 Malignant neoplasm of liver, not specified as primary or secondary: Secondary | ICD-10-CM | POA: Diagnosis not present

## 2015-09-02 DIAGNOSIS — R1310 Dysphagia, unspecified: Secondary | ICD-10-CM | POA: Diagnosis not present

## 2015-09-02 DIAGNOSIS — I129 Hypertensive chronic kidney disease with stage 1 through stage 4 chronic kidney disease, or unspecified chronic kidney disease: Secondary | ICD-10-CM | POA: Diagnosis not present

## 2015-09-02 DIAGNOSIS — R2681 Unsteadiness on feet: Secondary | ICD-10-CM | POA: Diagnosis not present

## 2015-09-02 DIAGNOSIS — N12 Tubulo-interstitial nephritis, not specified as acute or chronic: Secondary | ICD-10-CM | POA: Diagnosis not present

## 2015-09-05 DIAGNOSIS — R2681 Unsteadiness on feet: Secondary | ICD-10-CM | POA: Diagnosis not present

## 2015-09-05 DIAGNOSIS — I129 Hypertensive chronic kidney disease with stage 1 through stage 4 chronic kidney disease, or unspecified chronic kidney disease: Secondary | ICD-10-CM | POA: Diagnosis not present

## 2015-09-05 DIAGNOSIS — N183 Chronic kidney disease, stage 3 (moderate): Secondary | ICD-10-CM | POA: Diagnosis not present

## 2015-09-05 DIAGNOSIS — R1310 Dysphagia, unspecified: Secondary | ICD-10-CM | POA: Diagnosis not present

## 2015-09-05 DIAGNOSIS — N12 Tubulo-interstitial nephritis, not specified as acute or chronic: Secondary | ICD-10-CM | POA: Diagnosis not present

## 2015-09-05 DIAGNOSIS — C229 Malignant neoplasm of liver, not specified as primary or secondary: Secondary | ICD-10-CM | POA: Diagnosis not present

## 2015-09-07 DIAGNOSIS — R1310 Dysphagia, unspecified: Secondary | ICD-10-CM | POA: Diagnosis not present

## 2015-09-07 DIAGNOSIS — N183 Chronic kidney disease, stage 3 (moderate): Secondary | ICD-10-CM | POA: Diagnosis not present

## 2015-09-07 DIAGNOSIS — I129 Hypertensive chronic kidney disease with stage 1 through stage 4 chronic kidney disease, or unspecified chronic kidney disease: Secondary | ICD-10-CM | POA: Diagnosis not present

## 2015-09-07 DIAGNOSIS — N12 Tubulo-interstitial nephritis, not specified as acute or chronic: Secondary | ICD-10-CM | POA: Diagnosis not present

## 2015-09-07 DIAGNOSIS — C229 Malignant neoplasm of liver, not specified as primary or secondary: Secondary | ICD-10-CM | POA: Diagnosis not present

## 2015-09-07 DIAGNOSIS — R2681 Unsteadiness on feet: Secondary | ICD-10-CM | POA: Diagnosis not present

## 2015-09-12 LAB — BASIC METABOLIC PANEL
BUN: 16 mg/dL (ref 4–21)
Creatinine: 1.3 mg/dL — AB (ref 0.5–1.1)
GLUCOSE: 86 mg/dL
Potassium: 3.9 mmol/L (ref 3.4–5.3)
SODIUM: 133 mmol/L — AB (ref 137–147)

## 2015-09-13 ENCOUNTER — Encounter: Payer: Self-pay | Admitting: Internal Medicine

## 2015-09-13 ENCOUNTER — Non-Acute Institutional Stay: Payer: Medicare Other | Admitting: Internal Medicine

## 2015-09-13 DIAGNOSIS — I10 Essential (primary) hypertension: Secondary | ICD-10-CM | POA: Diagnosis not present

## 2015-09-13 DIAGNOSIS — G3184 Mild cognitive impairment, so stated: Secondary | ICD-10-CM

## 2015-09-13 DIAGNOSIS — E871 Hypo-osmolality and hyponatremia: Secondary | ICD-10-CM | POA: Diagnosis not present

## 2015-09-13 DIAGNOSIS — E039 Hypothyroidism, unspecified: Secondary | ICD-10-CM

## 2015-09-13 DIAGNOSIS — T8189XD Other complications of procedures, not elsewhere classified, subsequent encounter: Secondary | ICD-10-CM

## 2015-09-13 DIAGNOSIS — C229 Malignant neoplasm of liver, not specified as primary or secondary: Secondary | ICD-10-CM | POA: Diagnosis not present

## 2015-09-13 DIAGNOSIS — K668 Other specified disorders of peritoneum: Secondary | ICD-10-CM

## 2015-09-13 DIAGNOSIS — R634 Abnormal weight loss: Secondary | ICD-10-CM | POA: Diagnosis not present

## 2015-09-13 DIAGNOSIS — E119 Type 2 diabetes mellitus without complications: Secondary | ICD-10-CM | POA: Diagnosis not present

## 2015-09-13 NOTE — Progress Notes (Signed)
Patient ID: Destiny Velasquez, female   DOB: Jun 03, 1926, 82 y.o.   MRN: [id number]  Provider:  Rexene Edison. Mariea Clonts, D.O., C.M.D. Location:  [facility] PCP: Bralynn Velador, DO  Code Status: DNR Goals of Care: Advanced Directive information Does patient have an advance directive?: Yes, Type of Advance Directive: [place];Out of facility DNR (pink MOST or yellow form)   Chief Complaint  Patient presents with  . New Admit To [place]    moved to [place] 09/08/15     HPI: Patient is a 82 y.o. female seen today for admission to [facility] from home along with her husband, Dr. Carolynn Sayers.  Destiny Velasquez has recently been through a lot with liver cancer and diverticultis.  She already had some cognitive impairment, but this has gotten notably worse since her open partial right hepatectomy in Nov '16.  She remains independent in ADLs, but has some difficulty with medication mgt and short term memory loss.  Her daughter suspects that her increased confusion is somehow related to her cozaar that was restarted and would like her back on aldactone '25mg'$ .  I suspect it's coming more from the ativan she's getting 4x daily.  Her liver cancer is being followed with serial CTs by Dr. Barry Dienes.  There are no plans for additional interventions like chemotherapy.    She also continues to eat rather poorly with decreased intake since around September of last year.    Review of Systems  Constitutional: Positive for weight loss and malaise/fatigue. Negative for fever and chills.  HENT: Negative for congestion and hearing loss.   Eyes: Negative for blurred vision.  Respiratory: Negative for cough and shortness of breath.   Cardiovascular: Negative for chest pain and leg swelling.  Gastrointestinal: Positive for nausea, abdominal pain and constipation. Negative for heartburn, vomiting, diarrhea, blood in stool and melena.       Still has spurts of pain in RUQ where she had a drain postop and an abscess  that was treated  Genitourinary: Positive for urgency. Negative for dysuria, frequency and hematuria.  Musculoskeletal: Positive for joint pain. Negative for falls.  Skin: Negative for itching and rash.  Neurological: Positive for weakness. Negative for dizziness and loss of consciousness.  Endo/Heme/Allergies: Bruises/bleeds easily.  Psychiatric/Behavioral: Positive for depression and memory loss. The patient is nervous/anxious. The patient does not have insomnia.     Past Medical History  Diagnosis Date  . Hypertension   . Hyperlipidemia   . Thyroid disease   . Subacute delirium   . Essential and other specified forms of tremor   . Unspecified hypothyroidism   . Diverticulosis of colon (without mention of hemorrhage)   . Unspecified constipation   . Anal and rectal polyp   . Disorder of bone and cartilage, unspecified   . Pneumonia X 1  . GERD (gastroesophageal reflux disease)   . Malignant neoplasm of breast (female), unspecified site   . Breast cancer, right breast ([place]) 1971    "never had chemo or radiation"  . Liver cancer ([place]) 2016  . Diverticulitis of large intestine without perforation or abscess without bleeding    Past Surgical History  Procedure Laterality Date  . Appendectomy    . Abdominal hysterectomy    . Tonsillectomy and adenoidectomy  1935  . Cholecystectomy    . Breast biopsy Right 1971  . Mastectomy Right 1971  . Tubal ligation    . Cataract extraction w/ intraocular lens  implant, bilateral Bilateral   . Rectal  polypectomy  early 2000's  . Laparoscopy N/A 06/14/2015    Procedure: LAPAROSCOPY DIAGNOSTIC;  Surgeon: [name], MD;  Location: [facility];  Service: General;  Laterality: N/A;  . Open partial hepatectomy [83] N/A 06/14/2015    Procedure:  OPEN PARTIAL RIGHT HEPATECTOMY;  Surgeon: [name], MD;  Location: [facility];  Service: General;  Laterality: N/A;  converted to open @ 1447  . Liver biopsy  06/14/2015    Procedure: LIVER BIOPSY;  Surgeon:  [name], MD;  Location: [facility];  Service: General;;    reports that she quit smoking about 3 years ago. Her smoking use included Cigarettes. She has a 20 pack-year smoking history. She has never used smokeless tobacco. She reports that she does not drink alcohol or use illicit drugs. Family History  Problem Relation Age of Onset  . Cancer Mother   . Hypertension Father     No Known Allergies    Medication List       This list is accurate as of: 09/13/15 11:59 PM.  Always use your most recent med list.               acetaminophen 325 MG tablet  Commonly known as:  TYLENOL  Take 650 mg by mouth. Take one tablet every 6 hours as needed for pain/arthritis.     aspirin 81 MG tablet  Take 81 mg by mouth daily.     levothyroxine 100 MCG tablet  Commonly known as:  SYNTHROID, LEVOTHROID  TAKE ONE TABLET BY MOUTH 30 MINUTES BEFORE BREAKFAST EVERY MORNING. SEPARATE FROM OTHER MEDICATIONS FOR THYROID.     LORazepam 0.5 MG tablet  Commonly known as:  ATIVAN  Take 0.5 mg by mouth. Take one every 6 hours as needed for anxiety     losartan 50 MG tablet  Commonly known as:  COZAAR  Take one tablet by mouth once daily for blood pressure     ondansetron 4 MG tablet  Commonly known as:  ZOFRAN  Take 1 tablet (4 mg total) by mouth every 8 (eight) hours as needed for nausea or vomiting.     OVER THE COUNTER MEDICATION  Take 1 Container by mouth. One Boost plus twice daily     pantoprazole 40 MG tablet  Commonly known as:  PROTONIX  Take 1 tablet (40 mg total) by mouth daily.     polyethylene glycol packet  Commonly known as:  MIRALAX / GLYCOLAX  Take 17 g by mouth daily.     PROBIOTIC DAILY PO  Take 1 tablet by mouth daily.     shark liver oil-cocoa butter 0.25-3-85.5 % suppository  Commonly known as:  PREPARATION H  Place 1 suppository rectally as needed for hemorrhoids.     simethicone 125 MG chewable tablet  Commonly known as:  MYLICON  Chew 1 tablet (125 mg total)  by mouth 3 (three) times daily before meals.        Physical Exam: Filed Vitals:   09/13/15 1352  BP: 115/71  Pulse: 85  Temp: 97.7 F (36.5 C)  Resp: 16  Height: '5\' 3"'$  (1.6 m)  Weight: 135 lb (61.236 kg)  SpO2: 99%   Body mass index is 23.92 kg/(m^2). Physical Exam  Constitutional: No distress.  Chronically ill appearing female  HENT:  Head: Normocephalic and atraumatic.  Right Ear: External ear normal.  Left Ear: External ear normal.  Nose: Nose normal.  Mouth/Throat: Oropharynx is clear and moist. No oropharyngeal exudate.  Eyes: Conjunctivae and  EOM are normal. Pupils are equal, round, and reactive to light.  Neck: Normal range of motion. Neck supple. No JVD present. No thyromegaly present.  Cardiovascular: Normal rate, regular rhythm, normal heart sounds and intact distal pulses.   No murmur heard. Pulmonary/Chest: Effort normal and breath sounds normal. No respiratory distress.  Abdominal: Soft. Bowel sounds are normal. She exhibits no distension and no mass. There is tenderness. There is no rebound and no guarding.  Slight tenderness RUQ  Musculoskeletal: Normal range of motion.  Walks with walker   Lymphadenopathy:    She has no cervical adenopathy.  Neurological: She is alert. No cranial nerve deficit.  Oriented to person and place, but not exact date  Skin: Skin is warm and dry.  Psychiatric:  Somewhat flat affect, frequent sarcasm is unchanged    Labs reviewed: Basic Metabolic Panel:  Recent Labs  06/15/15 0510  07/07/15 0530 07/08/15 0425 07/09/15 0500 07/18/15 0926 08/01/15 09/12/15  NA 134*  < > 138 136 132* 138 137 133*  K 4.6  < > 3.3* 3.7 3.6 3.5 3.1* 3.9  CL 103  < > 108 105 101 98  --   --   CO2 24  < > '26 25 26 25  '$ --   --   GLUCOSE 258*  < > 136* 128* 130* 132*  --   --   BUN 19  < > '8 9 9 '$ 6*  --  16  CREATININE 1.54*  < > 0.74 0.88 0.85 0.92 0.9 1.3*  CALCIUM 9.0  < > 8.6* 8.7* 8.6* 9.9  --   --   MG 1.5*  --  1.5* 1.9  --   --   --    --   PHOS 3.7  --  1.8* 2.4* 2.6  --   --   --   < > = values in this interval not displayed. Liver Function Tests:  Recent Labs  07/02/15 0026 07/07/15 0530 07/18/15 0926  AST 24 14* 21  ALT 17 13* 11  ALKPHOS 84 66 92  BILITOT 0.6 0.5 0.3  PROT 6.9 5.1* 6.4  ALBUMIN 3.0* 2.0* 2.9*    Recent Labs  06/18/15 1126  LIPASE 27    Recent Labs  06/18/15 1126 06/19/15 0410  AMMONIA 19 20   CBC:  Recent Labs  07/02/15 0026  07/07/15 0530 07/09/15 0500 07/18/15 0926 08/01/15  WBC 14.5*  < > 12.6* 11.1* 8.9 8.2  NEUTROABS 10.8*  --  9.0*  --  6.6  --   HGB 11.4*  < > 9.5* 10.1*  --  11.0*  HCT 33.9*  < > 28.7* 30.7* 33.3* 34*  MCV 84.5  < > 84.2 84.3 85  --   PLT 386  < > 219 215 306 304  < > = values in this interval not displayed. Cardiac Enzymes: No results for input(s): CKTOTAL, CKMB, CKMBINDEX, TROPONINI in the last 8760 hours. BNP: Invalid input(s): POCBNP Lab Results  Component Value Date   HGBA1C 6.6* 03/30/2015   Lab Results  Component Value Date   TSH 1.410 07/18/2015   No results found for: VITAMINB12 No results found for: FOLATE No results found for: IRON, TIBC, FERRITIN  Imaging and Procedures obtained prior to SNF admission: Ct Abdomen Pelvis W Contrast  08/17/2015  CLINICAL DATA:  History of right-sided hepatic resection for poorly differentiated adenocarcinoma complicated by development of a postoperative biloma. Patient underwent percutaneous drainage catheter placement 07/05/2015 with follow-up CT scans obtained on  07/20/2015 and 08/03/2015. Patient returns today for repeat CT scan of the abdomen and pelvis. Diligent output records have been of provided from patient's nursing facility and demonstrate no significant output from the percutaneous drainage catheter within the past several days. EXAM: CT ABDOMEN AND PELVIS WITH CONTRAST TECHNIQUE: Multidetector CT imaging of the abdomen and pelvis was performed using the standard protocol following  bolus administration of intravenous contrast. CONTRAST:  181m OMNIPAQUE IOHEXOL 300 MG/ML  SOLN COMPARISON:  Ultrasound-guided hepatic abscess drainage catheter placement - 07/05/2015 ; CT abdomen pelvis - 07/04/2015; 07/20/2015; 08/03/2015 FINDINGS: Unchanged positioning of the percutaneous drainage catheter within the caudal aspect of the right lobe of the liver with continued slight decrease in the amount of ill-defined surrounding hypo attenuating fluid collection, currently measuring 5.2 x 3.2 x 2.4 cm (as measured in greatest axial - image 18, series 2; coronal - image 52, series 602) dimensions respectively, previously, 6.2 x 3.7 x 2.7 cm. No new definable/drainable intrahepatic fluid collections. Re- demonstrated mild diffuse slightly nodular hepatic contour. Ill-defined punctate (approximately 0.5 cm) hypo attenuating lesion within the cranial aspect of the right lobe of the liver (image 13, series 2) is unchanged as it is an adjacent punctate granuloma. No ascites. There is symmetric enhancement of the bilateral kidneys. No definite renal stones this postcontrast examination. Bilateral subcentimeter hypoattenuating renal lesions too small to adequately characterize of favored to represent renal cyst. No urinary obstruction or perinephric stranding. Normal appearance of the bilateral adrenal glands and pancreas. Punctate granuloma within otherwise normal-appearing spleen. Grossly unchanged extensive colonic diverticulosis with rather diffuse wall thickening involving an approximately 12 cm length of sigmoid colon within the left lower abdominal quadrant. These findings are again associated with a minimal amount adjacent pericolonic stranding but without new identifiable/drainable fluid collection. The bowel is otherwise normal in course and caliber without wall thickening. No pneumoperitoneum, pneumatosis or portal venous gas. Large amount of eccentric mixed calcified and noncalcified atherosclerotic  plaque within a normal caliber abdominal aorta. The major branch vessels of the abdominal aorta appear widely patent on this non CTA examination. No bulky retroperitoneal, mesenteric, pelvic or inguinal lymphadenopathy. Post hysterectomy. Left-sided hypo attenuating adnexal lesion is grossly unchanged with dominant cystic component measuring approximately 3.8 x 2.2 cm with additional adjacent cystic component now appearing as 2 discrete cystic lesions with dominant component measuring approximately 1.3 cm and adjacent smaller cystic lesion measuring approximately 1.0 cm (all cystic lesions seen on image 58, series 2). No discrete right-sided adnexal lesions. Limited visualization of the lower thorax demonstrates slight increase in size of trace right-sided effusion with associated worsening right basilar dependent ground-glass atelectasis. Normal heart size. Coronary artery calcifications. Calcifications within the aortic valve annulus. Interval increase in now small pericardial effusion. No acute or aggressive osseous abnormalities. Re- demonstrated grade 1 anterolisthesis of L4 upon L5 without associated pars defects. Moderate to severe DDD of L4-L5 with disc space height loss, endplate irregularity and sclerosis. A well healed surgical incision is noted within the right upper abdominal quadrant. There is a minimal amount of body wall anasarca. IMPRESSION: 1. Unchanged positioning of percutaneous drainage catheter with continued decrease in size of the now approximately 5.2 cm hepatic fluid collection, previously, 6.2 cm. No new definable/drainable fluid collections within the abdomen or pelvis. 2. Persistent near circumferential thickening involving an approximately 12 cm length of sigmoid colon again - again while favored to represent acute uncomplicated diverticulitis, further evaluation with colonoscopy (if not recently performed) is recommended to exclude the presence of  an underlying mass. 3. Unchanged size  and appearance of complex left adrenal cystic structure with dominant cystic component again measuring approximately 3.8 cm in diameter. 4. Interval increase in now small pericardial effusion with associated mild diffuse body wall anasarca, nonspecific though could be seen in the setting of heart failure. Clinical correlation is advised. Electronically Signed   By: Sandi Mariscal M.D.   On: 08/17/2015 10:00   Ir Radiologist Eval & Mgmt  08/17/2015  CLINICAL DATA:  Postprocedural hepatic biloma/abscess. EXAM: IR EVAL AND MANAGEMENT IMPRESSION: Please refer to full dictation in EPIC EMR. Electronically Signed   By: Sandi Mariscal M.D.   On: 08/17/2015 12:08    Assessment/Plan 1. Adenocarcinoma of liver s/p partial right hepatic lobectomy 06/14/2015 -s/p surgery complicated by acute renal failure, delirium, biloma, and later uti with pyelonephritis during rehab stay -she is not a candidate for further interventions including chemo -cont to follow with Dr. Barry Dienes for serial CTs  2. Essential hypertension -bp has been controlled with cozaar and on this due to renal protection benefit with DMII, but will d/c at family request and start on spironolactone '25mg'$  po daily  -bmp in 1 wk  3. Controlled type 2 diabetes mellitus without complication, without long-term current use of insulin ([place]) - Lab Results  Component Value Date   HGBA1C 6.6* 03/30/2015  under excellent control and intake has only diminished since so likely this is better -may be having some difficulty with hypoglycemia at times worsening confusion due to her poor po intake  4. Biloma following surgery, subsequent encounter -resolved--had drain and abx and got better  5. Mild cognitive impairment with memory loss -seems this has progressed to some mild dementia since her surgery and other medical problems late last year -is now having some functional problems like working remote and figuring out basic things in their new apt (?some of this  going on before but family unaware as she and her husband lived alone and somehow got by) -decrease ativan to bid from q 6 hr  6. Hyponatremia -f/u bmp next week  -this has historically worsened confusion, as well, for her  7. Hypothyroidism, unspecified hypothyroidism type - Lab Results  Component Value Date   TSH 1.410 07/18/2015   cont current synthroid--this is stable and does not explain wt loss  8. Loss of weight -due to liver cancer and recent surgery with multiple medical problems since -intake still not back to normal -encouraged higher protein/higher calorie intake with three meals per day to help with this -also dietitian to see both [facility] to assist with choices  Functional status:   Functional Status Survey: Is the patient deaf or have difficulty hearing?: No Does the patient have difficulty seeing, even when wearing glasses/contacts?: No Does the patient have difficulty concentrating, remembering, or making decisions?: Yes Does the patient have difficulty walking or climbing stairs?: Yes Does the patient have difficulty dressing or bathing?: No Does the patient have difficulty doing errands alone such as visiting a doctor's office or shopping?: Yes  Family/ staff Communication: discussed with her daughter and husband, nursing staff in [place], social work, DNS  Labs/tests ordered:  Bmp, prealbumin in 1 wk  Destiny Velasquez, D.O. Destiny Velasquez [address], [place] [address] Cell Phone (Mon-Fri 8am-5pm):  [phone] On Call:  [phone] & follow prompts after 5pm & weekends Office Phone:  [phone] Office Fax:  [phone]

## 2015-09-14 DIAGNOSIS — N12 Tubulo-interstitial nephritis, not specified as acute or chronic: Secondary | ICD-10-CM | POA: Diagnosis not present

## 2015-09-14 DIAGNOSIS — C229 Malignant neoplasm of liver, not specified as primary or secondary: Secondary | ICD-10-CM | POA: Diagnosis not present

## 2015-09-14 DIAGNOSIS — R1310 Dysphagia, unspecified: Secondary | ICD-10-CM | POA: Diagnosis not present

## 2015-09-14 DIAGNOSIS — R2681 Unsteadiness on feet: Secondary | ICD-10-CM | POA: Diagnosis not present

## 2015-09-14 DIAGNOSIS — I129 Hypertensive chronic kidney disease with stage 1 through stage 4 chronic kidney disease, or unspecified chronic kidney disease: Secondary | ICD-10-CM | POA: Diagnosis not present

## 2015-09-14 DIAGNOSIS — N183 Chronic kidney disease, stage 3 (moderate): Secondary | ICD-10-CM | POA: Diagnosis not present

## 2015-09-15 DIAGNOSIS — I129 Hypertensive chronic kidney disease with stage 1 through stage 4 chronic kidney disease, or unspecified chronic kidney disease: Secondary | ICD-10-CM | POA: Diagnosis not present

## 2015-09-15 DIAGNOSIS — R2681 Unsteadiness on feet: Secondary | ICD-10-CM | POA: Diagnosis not present

## 2015-09-15 DIAGNOSIS — N12 Tubulo-interstitial nephritis, not specified as acute or chronic: Secondary | ICD-10-CM | POA: Diagnosis not present

## 2015-09-15 DIAGNOSIS — R1310 Dysphagia, unspecified: Secondary | ICD-10-CM | POA: Diagnosis not present

## 2015-09-15 DIAGNOSIS — N183 Chronic kidney disease, stage 3 (moderate): Secondary | ICD-10-CM | POA: Diagnosis not present

## 2015-09-15 DIAGNOSIS — C229 Malignant neoplasm of liver, not specified as primary or secondary: Secondary | ICD-10-CM | POA: Diagnosis not present

## 2015-09-16 DIAGNOSIS — R1310 Dysphagia, unspecified: Secondary | ICD-10-CM | POA: Diagnosis not present

## 2015-09-16 DIAGNOSIS — I129 Hypertensive chronic kidney disease with stage 1 through stage 4 chronic kidney disease, or unspecified chronic kidney disease: Secondary | ICD-10-CM | POA: Diagnosis not present

## 2015-09-16 DIAGNOSIS — N12 Tubulo-interstitial nephritis, not specified as acute or chronic: Secondary | ICD-10-CM | POA: Diagnosis not present

## 2015-09-16 DIAGNOSIS — R2681 Unsteadiness on feet: Secondary | ICD-10-CM | POA: Diagnosis not present

## 2015-09-16 DIAGNOSIS — C229 Malignant neoplasm of liver, not specified as primary or secondary: Secondary | ICD-10-CM | POA: Diagnosis not present

## 2015-09-16 DIAGNOSIS — N183 Chronic kidney disease, stage 3 (moderate): Secondary | ICD-10-CM | POA: Diagnosis not present

## 2015-09-19 DIAGNOSIS — R2681 Unsteadiness on feet: Secondary | ICD-10-CM | POA: Diagnosis not present

## 2015-09-19 DIAGNOSIS — N12 Tubulo-interstitial nephritis, not specified as acute or chronic: Secondary | ICD-10-CM | POA: Diagnosis not present

## 2015-09-19 DIAGNOSIS — I129 Hypertensive chronic kidney disease with stage 1 through stage 4 chronic kidney disease, or unspecified chronic kidney disease: Secondary | ICD-10-CM | POA: Diagnosis not present

## 2015-09-19 DIAGNOSIS — N183 Chronic kidney disease, stage 3 (moderate): Secondary | ICD-10-CM | POA: Diagnosis not present

## 2015-09-19 DIAGNOSIS — C229 Malignant neoplasm of liver, not specified as primary or secondary: Secondary | ICD-10-CM | POA: Diagnosis not present

## 2015-09-19 DIAGNOSIS — R1310 Dysphagia, unspecified: Secondary | ICD-10-CM | POA: Diagnosis not present

## 2015-09-21 ENCOUNTER — Encounter: Payer: Self-pay | Admitting: Internal Medicine

## 2015-09-21 DIAGNOSIS — R1310 Dysphagia, unspecified: Secondary | ICD-10-CM | POA: Diagnosis not present

## 2015-09-21 DIAGNOSIS — N12 Tubulo-interstitial nephritis, not specified as acute or chronic: Secondary | ICD-10-CM | POA: Diagnosis not present

## 2015-09-21 DIAGNOSIS — C229 Malignant neoplasm of liver, not specified as primary or secondary: Secondary | ICD-10-CM | POA: Diagnosis not present

## 2015-09-21 DIAGNOSIS — I129 Hypertensive chronic kidney disease with stage 1 through stage 4 chronic kidney disease, or unspecified chronic kidney disease: Secondary | ICD-10-CM | POA: Diagnosis not present

## 2015-09-21 DIAGNOSIS — N183 Chronic kidney disease, stage 3 (moderate): Secondary | ICD-10-CM | POA: Diagnosis not present

## 2015-09-21 DIAGNOSIS — R2681 Unsteadiness on feet: Secondary | ICD-10-CM | POA: Diagnosis not present

## 2015-09-28 DIAGNOSIS — R488 Other symbolic dysfunctions: Secondary | ICD-10-CM | POA: Diagnosis not present

## 2015-09-28 DIAGNOSIS — G3184 Mild cognitive impairment, so stated: Secondary | ICD-10-CM | POA: Diagnosis not present

## 2015-09-30 DIAGNOSIS — R488 Other symbolic dysfunctions: Secondary | ICD-10-CM | POA: Diagnosis not present

## 2015-09-30 DIAGNOSIS — G3184 Mild cognitive impairment, so stated: Secondary | ICD-10-CM | POA: Diagnosis not present

## 2015-10-03 DIAGNOSIS — R488 Other symbolic dysfunctions: Secondary | ICD-10-CM | POA: Diagnosis not present

## 2015-10-03 DIAGNOSIS — G3184 Mild cognitive impairment, so stated: Secondary | ICD-10-CM | POA: Diagnosis not present

## 2015-10-04 ENCOUNTER — Ambulatory Visit (INDEPENDENT_AMBULATORY_CARE_PROVIDER_SITE_OTHER): Payer: Medicare Other | Admitting: Diagnostic Neuroimaging

## 2015-10-04 ENCOUNTER — Encounter: Payer: Self-pay | Admitting: Diagnostic Neuroimaging

## 2015-10-04 VITALS — BP 131/63 | HR 84 | Ht 63.0 in | Wt 133.4 lb

## 2015-10-04 DIAGNOSIS — R413 Other amnesia: Secondary | ICD-10-CM

## 2015-10-04 NOTE — Progress Notes (Signed)
[facility]  PATIENT: Destiny Velasquez DOB: 1925-08-18  REFERRING CLINICIAN: F Byerly HISTORY FROM: patient and daughter  REASON FOR VISIT: new consult    HISTORICAL  CHIEF COMPLAINT:  Chief Complaint  Patient presents with  . Confusion    rm 6, dgtr- [name], lives at [place], [place], "suspect she had TIA while in hospital Nov 2016 for liver cancer. Reasoning is lost, along w/interests; inability to follow steps/remember; depression, anger"    HISTORY OF PRESENT ILLNESS:   82 year old female here for evaluation of increasing depression, cognitive decline, decreased activity level, ever since hospitalization in November 2016.  Patient was previously quite active and independent. In October she was having cramping and abdominal pain. CT scan demonstrated a suspicious mass in the right liver. Biopsy demonstrated are adenocarcinoma and patient underwent liver mass tumor resection in November 2016. Postoperatively she had a complex course with prolonged ICU stay, while leakage, infection and debility.  Ever since that time patient has not been able to return to her prior functional and cognitive status. Patient states that she is under depression because of her current situation. Patient's daughter thinks that patient may have suffered a stroke or some other brain injury during her hospitalization.   REVIEW OF SYSTEMS: Full 14 system review of systems performed and notable only for memory loss confusion depression anxiety skin sensitivity constipation.  ALLERGIES: Allergies  Allergen Reactions  . Levaquin [Levofloxacin] Other (See Comments)    "unintelligable"    HOME MEDICATIONS: Outpatient Prescriptions Prior to Visit  Medication Sig Dispense Refill  . acetaminophen (TYLENOL) 325 MG tablet Take 650 mg by mouth. Take one tablet every 6 hours as needed for pain/arthritis.    Marland Kitchen levothyroxine (SYNTHROID, LEVOTHROID) 100 MCG tablet TAKE ONE TABLET BY MOUTH 30  MINUTES BEFORE BREAKFAST EVERY MORNING. SEPARATE FROM OTHER MEDICATIONS FOR THYROID. 60 tablet 5  . LORazepam (ATIVAN) 0.5 MG tablet Take 0.5 mg by mouth. Take one every 6 hours as needed for anxiety    . losartan (COZAAR) 50 MG tablet Take one tablet by mouth once daily for blood pressure (Patient taking differently: Take 50 mg by mouth daily. ) 90 tablet 1  . ondansetron (ZOFRAN) 4 MG tablet Take 1 tablet (4 mg total) by mouth every 8 (eight) hours as needed for nausea or vomiting. 30 tablet 0  . OVER THE COUNTER MEDICATION Take 1 Container by mouth. One Boost plus twice daily    . pantoprazole (PROTONIX) 40 MG tablet Take 1 tablet (40 mg total) by mouth daily. (Patient taking differently: Take 40 mg by mouth. Take one tablet twice daily at meals)    . polyethylene glycol (MIRALAX / GLYCOLAX) packet Take 17 g by mouth daily.     . Probiotic Product (PROBIOTIC DAILY PO) Take 1 tablet by mouth daily.     . shark liver oil-cocoa butter (PREPARATION H) 0.25-3-85.5 % suppository Place 1 suppository rectally as needed for hemorrhoids.    . simethicone (MYLICON) 742 MG chewable tablet Chew 1 tablet (125 mg total) by mouth 3 (three) times daily before meals. 30 tablet 0  . aspirin 81 MG tablet Take 81 mg by mouth daily. Reported on 10/04/2015     No facility-administered medications prior to visit.    PAST MEDICAL HISTORY: Past Medical History  Diagnosis Date  . Hypertension   . Hyperlipidemia   . Thyroid disease   . Subacute delirium   . Essential and other specified forms of tremor   . Unspecified hypothyroidism   .  Diverticulosis of colon (without mention of hemorrhage)   . Unspecified constipation   . Anal and rectal polyp   . Disorder of bone and cartilage, unspecified   . Pneumonia X 1  . GERD (gastroesophageal reflux disease)   . Diverticulitis of large intestine without perforation or abscess without bleeding   . Malignant neoplasm of breast (female), unspecified site   . Breast  cancer, right breast ([place]) 1971    "never had chemo or radiation"  . Liver cancer ([place]) 2016    PAST SURGICAL HISTORY: Past Surgical History  Procedure Laterality Date  . Appendectomy    . Abdominal hysterectomy    . Tonsillectomy and adenoidectomy  1935  . Cholecystectomy    . Breast biopsy Right 1971  . Mastectomy Right 1971  . Tubal ligation    . Cataract extraction w/ intraocular lens  implant, bilateral Bilateral   . Rectal polypectomy  early 2000's  . Laparoscopy N/A 06/14/2015    Procedure: LAPAROSCOPY DIAGNOSTIC;  Surgeon: [name], MD;  Location: [facility];  Service: General;  Laterality: N/A;  . Open partial hepatectomy [83] N/A 06/14/2015    Procedure:  OPEN PARTIAL RIGHT HEPATECTOMY;  Surgeon: [name], MD;  Location: [facility];  Service: General;  Laterality: N/A;  converted to open @ 1447  . Liver biopsy  06/14/2015    Procedure: LIVER BIOPSY;  Surgeon: [name], MD;  Location: [facility];  Service: General;;    FAMILY HISTORY: Family History  Problem Relation Age of Onset  . Cancer Mother   . Hypertension Father     SOCIAL HISTORY:  Social History   Social History  . Marital Status: Married    Spouse Name: N/A  . Number of Children: N/A  . Years of Education: N/A   Occupational History  . Not on file.   Social History Main Topics  . Smoking status: Former Smoker -- 0.50 packs/day for 40 years    Types: Cigarettes    Quit date: 08/13/2012  . Smokeless tobacco: Never Used     Comment: "quit smoking cigarettes years ago; I was probably in my 35's"  . Alcohol Use: No     Comment: once per month   . Drug Use: No  . Sexual Activity: No   Other Topics Concern  . Not on file   Social History Narrative     PHYSICAL EXAM  GENERAL EXAM/CONSTITUTIONAL: Vitals:  Filed Vitals:   10/04/15 0921  BP: 131/63  Pulse: 84  Height: '5\' 3"'$  (1.6 m)  Weight: 133 lb 6.4 oz (60.51 kg)     Body mass index is 23.64 kg/(m^2).  No exam data  present  Patient is in no distress; well developed, nourished and groomed; neck is supple  CARDIOVASCULAR:  Examination of carotid arteries is normal; no carotid bruits  Regular rate and rhythm, no murmurs  Examination of peripheral vascular system by observation and palpation is normal  EYES:  Ophthalmoscopic exam of optic discs and posterior segments is normal; no papilledema or hemorrhages  MUSCULOSKELETAL:  Gait, strength, tone, movements noted in Neurologic exam below  NEUROLOGIC: MENTAL STATUS:  MMSE - Mini Mental State Exam 10/04/2015 08/22/2015  Orientation to time 4 4  Orientation to Place 5 5  Registration 3 3  Attention/ Calculation 5 5  Recall 3 3  Language- name 2 objects 2 2  Language- repeat 1 1  Language- follow 3 step command 3 3  Language- read & follow direction 1 1  Write a  sentence 1 1  Copy design 1 1  Total score 29 29    awake, alert, oriented to person, place and time  recent and remote memory intact  normal attention and concentration  language fluent, comprehension intact, naming intact,   fund of knowledge appropriate  CRANIAL NERVE:   2nd - no papilledema on fundoscopic exam  2nd, 3rd, 4th, 6th - pupils equal and reactive to light, visual fields full to confrontation, extraocular muscles intact, no nystagmus  5th - facial sensation symmetric  7th - facial strength symmetric  8th - hearing intact  9th - palate elevates symmetrically, uvula midline  11th - shoulder shrug symmetric  12th - tongue protrusion midline  MOTOR:   normal bulk and tone, full strength in the BUE, BLE; DECR LEFT FOOT DF (4)  SENSORY:   normal and symmetric to light touch, temperature, vibration  COORDINATION:   finger-nose-finger, fine finger movements normal  REFLEXES:   deep tendon reflexes present and symmetric; TRACE AT ANKLES  POSITIVE SNOUT AND MYERSONS REFLEXES  GAIT/STATION:   narrow based gait    DIAGNOSTIC DATA (LABS,  IMAGING, TESTING) - I reviewed patient records, labs, notes, testing and imaging myself where available.  Lab Results  Component Value Date   WBC 8.2 08/01/2015   HGB 11.0* 08/01/2015   HCT 34* 08/01/2015   MCV 85 07/18/2015   PLT 304 08/01/2015      Component Value Date/Time   NA 133* 09/12/2015   NA 132* 07/09/2015 0500   K 3.9 09/12/2015   CL 98 07/18/2015 0926   CO2 25 07/18/2015 0926   GLUCOSE 132* 07/18/2015 0926   GLUCOSE 130* 07/09/2015 0500   BUN 16 09/12/2015   BUN 9 07/09/2015 0500   CREATININE 1.3* 09/12/2015   CREATININE 0.92 07/18/2015 0926   CALCIUM 9.9 07/18/2015 0926   PROT 6.4 07/18/2015 0926   PROT 5.1* 07/07/2015 0530   ALBUMIN 2.9* 07/18/2015 0926   ALBUMIN 2.0* 07/07/2015 0530   AST 21 07/18/2015 0926   ALT 11 07/18/2015 0926   ALKPHOS 92 07/18/2015 0926   BILITOT 0.3 07/18/2015 0926   BILITOT 0.5 07/07/2015 0530   GFRNONAA 56* 07/18/2015 0926   GFRAA 65 07/18/2015 0926   Lab Results  Component Value Date   CHOL 264* 03/30/2015   HDL 76 03/30/2015   LDLCALC 154* 03/30/2015   TRIG 83 07/07/2015   CHOLHDL 3.5 03/30/2015   Lab Results  Component Value Date   HGBA1C 6.6* 03/30/2015   No results found for: [id number] Lab Results  Component Value Date   TSH 1.410 07/18/2015    06/22/15 MRI brain [I reviewed images myself and agree with interpretation. -VRP]  1. Moderate atrophy and white matter disease. This likely reflects the sequela of chronic microvascular ischemia. 2. No acute infarct.     ASSESSMENT AND PLAN  82 y.o. year old female here with cognitive, functional and mood decline since liver mass resection in November 4854, complicated by critical illness, infection, and prolonged hospital stay. Neurologic examination unremarkable except for positive frontal release signs, and left foot drop weakness. In retrospect patient may have been having some mild memory loss prior to surgery, but significantly worsened during the stress of  surgery and hospitalization. May represent underlying mild dementia or mild cognitive impairment with superimposed cognitive and mood decline due to her circumstance and situation. Given patient's age, medical conditions, overall status, would favor conservative approach and monitor symptoms. No further neurologic testing recommended.  Ddx: memory loss (  mild dementia vs MCI vs pseudo-dementia of depression vs paraneoplastic syndrome)  1. Memory loss      PLAN: - monitor symptoms - no further neurologic testing advised - follow up with PCP (Dr. Mariea Clonts)  Return if symptoms worsen or fail to improve, for return to PCP.    Penni Bombard, MD 3/73/6681, 59:47 AM Certified in Neurology, Neurophysiology and Neuroimaging  [facility] [address] [place], [place] [address] [phone]

## 2015-10-04 NOTE — Patient Instructions (Addendum)
Thank you for coming to see us at [facility]. I hope we have been able to provide you high quality care today.  You may receive a patient satisfaction survey over the next few weeks. We would appreciate your feedback and comments so that we may continue to improve ourselves and the health of our patients.  - continue current medications - monitor symptoms   ~~~~~~~~~~~~~~~~~~~~~~~~~~~~~~~~~~~~~~~~~~~~~~~~~~~~~~~~~~~~~~~~~  DR. 'S GUIDE TO HAPPY AND HEALTHY LIVING These are some of my general health and wellness recommendations. Some of them may apply to you better than others. Please use common sense as you try these suggestions and feel free to ask me any questions.   ACTIVITY/FITNESS Mental, social, emotional and physical stimulation are very important for brain and body health. Try learning a new activity (arts, music, language, sports, games).  Keep moving your body to the best of your abilities. You can do this at home, inside or outside, the park, community center, gym or anywhere you like. Consider a physical therapist or personal trainer to get started. Consider the app Sworkit.    NUTRITION Eat more plants: colorful vegetables and berries and fruits.  Eat less sugar, salt, preservatives and processed foods.  Avoid toxins such as cigarettes and alcohol.  Drink water when you are thirsty. Warm water with a slice of lemon is an excellent morning drink to start the day.  Consider these websites for more information The Nutrition Source (https://www.hsph.har[employer].edu/nutritionsource) Precision Nutrition (www.precisionnutrition.com/blog/infographics)   RELAXATION Consider practicing mindfulness meditation or other relaxation techniques such as deep breathing, prayer, yoga, tai chi, massage. See website mindful.org or the apps Headspace or Calm to help get started.   SLEEP Try to get at least 7-8+ hours sleep per day. Regular exercise and reduced  caffeine will help you sleep better. Practice good sleep hygeine techniques. See website sleep.org for more information.   PLANNING Prepare estate planning, living will, healthcare POA documents. Sometimes this is best planned with the help of an attorney. Theconversationproject.org and agingwithdignity.org are excellent resources. 

## 2015-10-06 DIAGNOSIS — G3184 Mild cognitive impairment, so stated: Secondary | ICD-10-CM | POA: Diagnosis not present

## 2015-10-06 DIAGNOSIS — R488 Other symbolic dysfunctions: Secondary | ICD-10-CM | POA: Diagnosis not present

## 2015-10-10 DIAGNOSIS — R488 Other symbolic dysfunctions: Secondary | ICD-10-CM | POA: Diagnosis not present

## 2015-10-10 DIAGNOSIS — G3184 Mild cognitive impairment, so stated: Secondary | ICD-10-CM | POA: Diagnosis not present

## 2015-10-11 DIAGNOSIS — G3184 Mild cognitive impairment, so stated: Secondary | ICD-10-CM | POA: Diagnosis not present

## 2015-10-11 DIAGNOSIS — R488 Other symbolic dysfunctions: Secondary | ICD-10-CM | POA: Diagnosis not present

## 2015-10-17 ENCOUNTER — Other Ambulatory Visit: Payer: Medicare Other

## 2015-10-17 DIAGNOSIS — G3184 Mild cognitive impairment, so stated: Secondary | ICD-10-CM | POA: Diagnosis not present

## 2015-10-17 DIAGNOSIS — R488 Other symbolic dysfunctions: Secondary | ICD-10-CM | POA: Diagnosis not present

## 2015-10-20 DIAGNOSIS — R739 Hyperglycemia, unspecified: Secondary | ICD-10-CM | POA: Diagnosis not present

## 2015-10-21 ENCOUNTER — Encounter: Payer: Medicare Other | Admitting: Internal Medicine

## 2015-10-25 DIAGNOSIS — G3184 Mild cognitive impairment, so stated: Secondary | ICD-10-CM | POA: Insufficient documentation

## 2015-10-25 DIAGNOSIS — R634 Abnormal weight loss: Secondary | ICD-10-CM | POA: Insufficient documentation

## 2015-10-25 DIAGNOSIS — E039 Hypothyroidism, unspecified: Secondary | ICD-10-CM | POA: Insufficient documentation

## 2015-10-25 DIAGNOSIS — E119 Type 2 diabetes mellitus without complications: Secondary | ICD-10-CM | POA: Insufficient documentation

## 2015-10-26 DIAGNOSIS — R109 Unspecified abdominal pain: Secondary | ICD-10-CM | POA: Diagnosis not present

## 2015-10-27 ENCOUNTER — Encounter: Payer: Self-pay | Admitting: Adult Health

## 2015-10-27 ENCOUNTER — Non-Acute Institutional Stay: Payer: Medicare Other | Admitting: Adult Health

## 2015-10-27 DIAGNOSIS — C229 Malignant neoplasm of liver, not specified as primary or secondary: Secondary | ICD-10-CM | POA: Diagnosis not present

## 2015-10-27 DIAGNOSIS — R188 Other ascites: Secondary | ICD-10-CM

## 2015-10-27 DIAGNOSIS — I10 Essential (primary) hypertension: Secondary | ICD-10-CM | POA: Diagnosis not present

## 2015-10-27 DIAGNOSIS — E46 Unspecified protein-calorie malnutrition: Secondary | ICD-10-CM | POA: Diagnosis not present

## 2015-10-27 LAB — BASIC METABOLIC PANEL
BUN: 21 mg/dL (ref 4–21)
CREATININE: 1.1 mg/dL (ref 0.5–1.1)
Glucose: 178 mg/dL
POTASSIUM: 4.1 mmol/L (ref 3.4–5.3)
Sodium: 134 mmol/L — AB (ref 137–147)

## 2015-10-27 LAB — CBC AND DIFFERENTIAL
HCT: 34 % — AB (ref 36–46)
Hemoglobin: 10.6 g/dL — AB (ref 12.0–16.0)
Platelets: 277 10*3/uL (ref 150–399)
WBC: 8.6 10^3/mL

## 2015-10-27 NOTE — Progress Notes (Signed)
Patient ID: Destiny Velasquez, female   DOB: 1926/06/23, 83 y.o.   MRN: [id number]  Location:  [place]:  ALF (13) Provider:   Cindi Carbon, ANP [place] [phone]   [name], Jonelle Sidle, DO  Patient Care Team: Gayland Curry, DO as PCP - General (Geriatric Medicine) [name], MD as Consulting Physician (General Surgery)  Extended Emergency Contact Information Primary Emergency Contact: Jones,[name] Address: [address]          [place], [place] [address] [place] of [place] Phone: [phone] Mobile Phone: [phone] Relation: Daughter Secondary Emergency Contact: Lorretta Harp Address: [place], [place] Phone: [phone] Relation: Daughter  Code Status:  DNR Goals of care: Advanced Directive information Advanced Directives 10/04/2015  Does patient have an advance directive? -  Type of Paramedic of [place];Out of facility DNR (pink MOST or yellow form)  Copy of advanced directive(s) in chart? -     Chief Complaint  Patient presents with  . Acute Visit    LLQ pain    HPI:  Pt is a 82 y.o. female seen today for an acute visit for LLQ pain present for 5 days.  She reports noted the pain the morning and that it subsides during the day. The staff noted a small mass in the LLQ that was tender and so an abdominal US was ordered. The ultrasound shows a fluid collection on the LLQ of 5.6 cm that may represent an abscess.  There is also a 5.6 mm kidney stone on the right.  The resident is status post open partial liver resection by Dr. Barry Dienes with [facility] surgery. She is followed by serial CT scans but according to oncology notes was not a candidate for chemo.  She is afebrile and reports that the pain has subsided now. She is eating and drinking but has had progressive weight loss while at [facility] due to poor intake and  dementia.     Past Medical History  Diagnosis Date  . Hypertension   . Hyperlipidemia   . Thyroid disease   . Subacute delirium   . Essential and other specified forms of tremor   . Unspecified hypothyroidism   . Diverticulosis of colon (without mention of hemorrhage)   . Unspecified constipation   . Anal and rectal polyp   . Disorder of bone and cartilage, unspecified   . Pneumonia X 1  . GERD (gastroesophageal reflux disease)   . Diverticulitis of large intestine without perforation or abscess without bleeding   . Malignant neoplasm of breast (female), unspecified site   . Breast cancer, right breast ([place]) 1971    "never had chemo or radiation"  . Liver cancer ([place]) 2016   Past Surgical History  Procedure Laterality Date  . Appendectomy    . Abdominal hysterectomy    . Tonsillectomy and adenoidectomy  1935  . Cholecystectomy    . Breast biopsy Right 1971  . Mastectomy Right 1971  . Tubal ligation    . Cataract extraction w/ intraocular lens  implant, bilateral Bilateral   . Rectal polypectomy  early 2000's  . Laparoscopy N/A 06/14/2015    Procedure: LAPAROSCOPY DIAGNOSTIC;  Surgeon: [name], MD;  Location: [facility];  Service: General;  Laterality: N/A;  . Open partial hepatectomy [83] N/A 06/14/2015    Procedure:  OPEN PARTIAL RIGHT HEPATECTOMY;  Surgeon: [name], MD;  Location: [facility];  Service: General;  Laterality: N/A;  converted to open @ 1447  . Liver biopsy  06/14/2015    Procedure: LIVER BIOPSY;  Surgeon: [name], MD;  Location: [facility];  Service: General;;    Allergies  Allergen Reactions  . Levaquin [Levofloxacin] Other (See Comments)    "unintelligable"      Medication List       This list is accurate as of: 10/27/15 12:08 PM.  Always use your most recent med list.               acetaminophen 325 MG tablet  Commonly known as:  TYLENOL  Take 650 mg by mouth. Take one tablet every 6 hours as needed for pain/arthritis.     aspirin 81 MG  tablet  Take 81 mg by mouth daily. Reported on 10/04/2015     feeding supplement Liqd  Take 1 Container by mouth 3 (three) times daily between meals.     levothyroxine 100 MCG tablet  Commonly known as:  SYNTHROID, LEVOTHROID  TAKE ONE TABLET BY MOUTH 30 MINUTES BEFORE BREAKFAST EVERY MORNING. SEPARATE FROM OTHER MEDICATIONS FOR THYROID.     LORazepam 0.5 MG tablet  Commonly known as:  ATIVAN  Take 0.5 mg by mouth. Take one every 6 hours as needed for anxiety     losartan 50 MG tablet  Commonly known as:  COZAAR  Take one tablet by mouth once daily for blood pressure     mirtazapine 7.5 MG tablet  Commonly known as:  REMERON  Take 7.5 mg by mouth at bedtime.     ondansetron 4 MG tablet  Commonly known as:  ZOFRAN  Take 1 tablet (4 mg total) by mouth every 8 (eight) hours as needed for nausea or vomiting.     OVER THE COUNTER MEDICATION  Take 1 Container by mouth. One Boost plus twice daily     pantoprazole 40 MG tablet  Commonly known as:  PROTONIX  Take 1 tablet (40 mg total) by mouth daily.     polyethylene glycol packet  Commonly known as:  MIRALAX / GLYCOLAX  Take 17 g by mouth daily.     PROBIOTIC DAILY PO  Take 1 tablet by mouth daily.     shark liver oil-cocoa butter 0.25-3-85.5 % suppository  Commonly known as:  PREPARATION H  Place 1 suppository rectally as needed for hemorrhoids.     simethicone 125 MG chewable tablet  Commonly known as:  MYLICON  Chew 1 tablet (125 mg total) by mouth 3 (three) times daily before meals.     spironolactone 25 MG tablet  Commonly known as:  ALDACTONE  Take 25 mg by mouth daily.        Review of Systems  Constitutional: Positive for appetite change and unexpected weight change. Negative for fever, chills, diaphoresis, activity change and fatigue.  Respiratory: Negative for cough and shortness of breath.   Cardiovascular: Negative for chest pain, palpitations and leg swelling.  Gastrointestinal: Positive for abdominal  pain. Negative for nausea, vomiting, diarrhea, constipation, blood in stool, abdominal distention, anal bleeding and rectal pain.  Genitourinary: Negative for dysuria and difficulty urinating.  Musculoskeletal: Positive for gait problem. Negative for back pain and arthralgias.  Skin: Negative for rash and wound.  Neurological: Negative for dizziness, facial asymmetry and headaches.  Psychiatric/Behavioral: Positive for confusion. Negative for agitation.    Immunization History  Administered Date(s) Administered  . Influenza,inj,Quad PF,36+ Mos 07/18/2015  . Influenza-Unspecified 05/27/2013, 05/26/2014  .  Pneumococcal Conjugate-13 10/01/2014  . Pneumococcal Polysaccharide-23 08/14/2011   Pertinent  Health Maintenance Due  Topic Date Due  . FOOT EXAM  03/13/1936  . OPHTHALMOLOGY EXAM  03[birth year]13/1936  . HEMOGLOBIN A1C  09/30/2015  . INFLUENZA VACCINE  03/13/2016  . DEXA SCAN  Completed  . PNA vac Low Risk Adult  Completed   Fall Risk  10/04/2015 04/14/2015 03/18/2014 02/16/2013 01/26/2013  Falls in the past year? Yes No No No No   Functional Status Survey:    Filed Vitals:   10/27/15 1200  BP: 110/62  Pulse: 76  Temp: 98.2 F (36.8 C)  Resp: 20  SpO2: 98%   There is no weight on file to calculate BMI. Physical Exam  Constitutional: She is oriented to person, place, and time. No distress.  HENT:  Head: Normocephalic and atraumatic.  Neck: No JVD present.  Cardiovascular: Normal rate and regular rhythm.   No murmur heard. Pulmonary/Chest: Effort normal and breath sounds normal. No respiratory distress.  Abdominal: Soft. Bowel sounds are normal. She exhibits no distension. There is tenderness (LLQ).  Firm area noted to the LLQ, difficult to palpate size or fluctuance  Neurological: She is alert and oriented to person, place, and time.  Skin: Skin is warm and dry. She is not diaphoretic.  Psychiatric: She has a normal mood and affect.    Labs reviewed:  Recent Labs   06/15/15 0510  07/07/15 0530 07/08/15 0425 07/09/15 0500 07/18/15 0926 08/01/15 09/12/15  NA 134*  < > 138 136 132* 138 137 133*  K 4.6  < > 3.3* 3.7 3.6 3.5 3.1* 3.9  CL 103  < > 108 105 101 98  --   --   CO2 24  < > '26 25 26 25  '$ --   --   GLUCOSE 258*  < > 136* 128* 130* 132*  --   --   BUN 19  < > '8 9 9 '$ 6*  --  16  CREATININE 1.54*  < > 0.74 0.88 0.85 0.92 0.9 1.3*  CALCIUM 9.0  < > 8.6* 8.7* 8.6* 9.9  --   --   MG 1.5*  --  1.5* 1.9  --   --   --   --   PHOS 3.7  --  1.8* 2.4* 2.6  --   --   --   < > = values in this interval not displayed.  Recent Labs  07/02/15 0026 07/07/15 0530 07/18/15 0926  AST 24 14* 21  ALT 17 13* 11  ALKPHOS 84 66 92  BILITOT 0.6 0.5 0.3  PROT 6.9 5.1* 6.4  ALBUMIN 3.0* 2.0* 2.9*    Recent Labs  07/02/15 0026  07/07/15 0530 07/09/15 0500 07/18/15 0926 08/01/15  WBC 14.5*  < > 12.6* 11.1* 8.9 8.2  NEUTROABS 10.8*  --  9.0*  --  6.6  --   HGB 11.4*  < > 9.5* 10.1*  --  11.0*  HCT 33.9*  < > 28.7* 30.7* 33.3* 34*  MCV 84.5  < > 84.2 84.3 85  --   PLT 386  < > 219 215 306 304  < > = values in this interval not displayed. Lab Results  Component Value Date   TSH 1.410 07/18/2015   Lab Results  Component Value Date   HGBA1C 6.6* 03/30/2015   Lab Results  Component Value Date   CHOL 264* 03/30/2015   HDL 76 03/30/2015   LDLCALC 154* 03/30/2015   TRIG 83  07/07/2015   CHOLHDL 3.5 03/30/2015    Significant Diagnostic Results in last 30 days:  No results found.  Assessment/Plan 1. Adenocarcinoma of liver s/p partial right hepatic lobectomy 06/14/2015 -serial CT scans for surveillance per onc/surgery -no right sided pain, losing weight  2. Abdominal fluid collection -noted on the left with a hx of diverticulitis -no fever, no elevated WBC -remains with mild to moderate pain and tenderness on exam -I spoke with [facility] [place] [facility] and Dr. Harlow Asa was covering for Dr. Barry Dienes.  They did not feel it was necessary to perform a CT  scan at this time to eval further this fluid collection. They moved her apt up to 11/15/14.  They felt it was unlikely to be due to her previous surgery.  Given her tenderness and previous hx I have decided to place her on Cipro 500 mg BID for 10 days with Flagyl 500 mg TID for 10 days. She should avoid alcohol. I have ordered another CBC in 1 week to monitor her white count and VS qshift.  I instructed the staff that if she has increased pain, GI upset, or fever to please notify us. I will appreciate any further input surgery.  At this point she remains stable, ambulatory, and eating/drinking well. We will continue to monitor her situation.     Family/ staff Communication: discussed with staff and daughter, [name].   Labs/tests ordered:  CBC, BMP   Cindi Carbon, [place] [phone]

## 2015-11-03 DIAGNOSIS — I10 Essential (primary) hypertension: Secondary | ICD-10-CM | POA: Diagnosis not present

## 2015-11-03 LAB — CBC AND DIFFERENTIAL
HEMATOCRIT: 34 % — AB (ref 36–46)
Hemoglobin: 11 g/dL — AB (ref 12.0–16.0)
Platelets: 287 10*3/uL (ref 150–399)
WBC: 9 10^3/mL

## 2015-11-09 ENCOUNTER — Encounter: Payer: Self-pay | Admitting: Internal Medicine

## 2015-11-09 ENCOUNTER — Non-Acute Institutional Stay: Payer: Medicare Other | Admitting: Internal Medicine

## 2015-11-09 VITALS — BP 110/64 | HR 82 | Temp 98.5°F | Ht 63.0 in | Wt 136.0 lb

## 2015-11-09 DIAGNOSIS — I10 Essential (primary) hypertension: Secondary | ICD-10-CM | POA: Diagnosis not present

## 2015-11-09 DIAGNOSIS — K5720 Diverticulitis of large intestine with perforation and abscess without bleeding: Secondary | ICD-10-CM

## 2015-11-09 DIAGNOSIS — E119 Type 2 diabetes mellitus without complications: Secondary | ICD-10-CM

## 2015-11-09 DIAGNOSIS — G3184 Mild cognitive impairment, so stated: Secondary | ICD-10-CM

## 2015-11-09 DIAGNOSIS — C229 Malignant neoplasm of liver, not specified as primary or secondary: Secondary | ICD-10-CM

## 2015-11-09 MED ORDER — CIPROFLOXACIN HCL 500 MG PO TABS: 500.0000 mg | ORAL_TABLET | Freq: Two times a day (BID) | ORAL | Status: DC

## 2015-11-09 MED ORDER — METRONIDAZOLE 500 MG PO TABS: 500.0000 mg | ORAL_TABLET | Freq: Three times a day (TID) | ORAL | Status: DC

## 2015-11-09 NOTE — Progress Notes (Signed)
Patient ID: Destiny Velasquez, female   DOB: 03/07/1926, 82 y.o.   MRN: [id number]   Location:  [facility] Room Number: 151 Place of Service:  Clinic (12)  Provider: Hoorain Kozakiewicz L. Mariea Clonts, D.O., C.M.D.  Code Status: DNR Goals of Care:  Advanced Directives 11/09/2015  Does patient have an advance directive? Yes  Type of Paramedic of [place];Out of facility DNR (pink MOST or yellow form)  Copy of advanced directive(s) in chart? Yes  Pre-existing out of facility DNR order (yellow form or pink MOST form) Yellow form placed in chart (order not valid for inpatient use)     Chief Complaint  Patient presents with  . Medical Management of Chronic Issues    medication management blood pressure, CKD. Moving to [facility]. Here with daughter Eustaquio Maize    HPI: Patient is a 82 y.o. female seen today for medical management of chronic diseases, to sign FL2 form for her to go to [facility] with her husband from [place].  Also f/u on her abdominal pain and terribly explosive diarrhea.  Has localized in LLQ behind her left hip.  Only one loose stool today.    Pain has improved.  She had initially been treated for a new fluid collection presumed to be diverticular abscess when a fluid collection was identified on abdominal ultrasound.  She's remained afebrile with also otherwise normal vitals.  Her husband has been determined to have had an amoebic diarrhea.  Unclear how he got that.  Past Medical History  Diagnosis Date  . Hypertension   . Hyperlipidemia   . Thyroid disease   . Subacute delirium   . Essential and other specified forms of tremor   . Unspecified hypothyroidism   . Diverticulosis of colon (without mention of hemorrhage)   . Unspecified constipation   . Anal and rectal polyp   . Disorder of bone and cartilage, unspecified   . Pneumonia X 1  . GERD (gastroesophageal reflux disease)   . Diverticulitis of large intestine without  perforation or abscess without bleeding   . Malignant neoplasm of breast (female), unspecified site   . Breast cancer, right breast ([place]) 1971    "never had chemo or radiation"  . Liver cancer ([place]) 2016    Past Surgical History  Procedure Laterality Date  . Appendectomy    . Abdominal hysterectomy    . Tonsillectomy and adenoidectomy  1935  . Cholecystectomy    . Breast biopsy Right 1971  . Mastectomy Right 1971  . Tubal ligation    . Cataract extraction w/ intraocular lens  implant, bilateral Bilateral   . Rectal polypectomy  early 2000's  . Laparoscopy N/A 06/14/2015    Procedure: LAPAROSCOPY DIAGNOSTIC;  Surgeon: [name], MD;  Location: [facility];  Service: General;  Laterality: N/A;  . Open partial hepatectomy [83] N/A 06/14/2015    Procedure:  OPEN PARTIAL RIGHT HEPATECTOMY;  Surgeon: [name], MD;  Location: [facility];  Service: General;  Laterality: N/A;  converted to open @ 1447  . Liver biopsy  06/14/2015    Procedure: LIVER BIOPSY;  Surgeon: [name], MD;  Location: [facility];  Service: General;;    Allergies  Allergen Reactions  . Levaquin [Levofloxacin] Other (See Comments)    "unintelligable"      Medication List       This list is accurate as of: 11/09/15  4:02 PM.  Always use your most recent med list.  acetaminophen 325 MG tablet  Commonly known as:  TYLENOL  Take 650 mg by mouth. Take one tablet every 6 hours as needed for pain/arthritis. Take 2 tablets every 6 hours as needed pain up to 72 hours     aspirin 81 MG tablet  Take 81 mg by mouth daily. Reported on 10/04/2015     ciprofloxacin 500 MG tablet  Commonly known as:  CIPRO  Take 500 mg by mouth 2 (two) times daily. For 10 days     feeding supplement Liqd  Take 1 Container by mouth 3 (three) times daily between meals.     levothyroxine 100 MCG tablet  Commonly known as:  SYNTHROID, LEVOTHROID  TAKE ONE TABLET BY MOUTH 30 MINUTES BEFORE BREAKFAST EVERY MORNING. SEPARATE FROM  OTHER MEDICATIONS FOR THYROID.     LORazepam 0.5 MG tablet  Commonly known as:  ATIVAN  Take 0.5 mg by mouth. Take one twice a day for anxiety     losartan 50 MG tablet  Commonly known as:  COZAAR  Take one tablet by mouth once daily for blood pressure     metroNIDAZOLE 500 MG tablet  Commonly known as:  FLAGYL  Take 500 mg by mouth 3 (three) times daily. For 10 days     mirtazapine 7.5 MG tablet  Commonly known as:  REMERON  Take 7.5 mg by mouth at bedtime.     OVER THE COUNTER MEDICATION  Take 1 Container by mouth. One Boost plus twice daily     pantoprazole 40 MG tablet  Commonly known as:  PROTONIX  Take 1 tablet (40 mg total) by mouth daily.     polyethylene glycol packet  Commonly known as:  MIRALAX / GLYCOLAX  Take 17 g by mouth daily.     PROBIOTIC DAILY PO  Take 1 tablet by mouth daily.     ranitidine 150 MG tablet  Commonly known as:  ZANTAC  Take 150 mg by mouth. As needed for indigestion     shark liver oil-cocoa butter 0.25-3-85.5 % suppository  Commonly known as:  PREPARATION H  Place 1 suppository rectally as needed for hemorrhoids.     simethicone 125 MG chewable tablet  Commonly known as:  MYLICON  Chew 1 tablet (125 mg total) by mouth 3 (three) times daily before meals.     spironolactone 25 MG tablet  Commonly known as:  ALDACTONE  Take 25 mg by mouth daily.        Review of Systems:  Review of Systems  Constitutional: Negative for fever, chills, activity change, appetite change and fatigue.  HENT: Negative for congestion and hearing loss.   Eyes: Negative for visual disturbance.  Respiratory: Negative for shortness of breath.   Cardiovascular: Negative for chest pain and leg swelling.  Gastrointestinal: Positive for abdominal pain and diarrhea. Negative for nausea, vomiting, blood in stool, abdominal distention, anal bleeding and rectal pain.  Genitourinary: Positive for urgency. Negative for dysuria and frequency.  Musculoskeletal:  Positive for arthralgias.  Neurological: Positive for weakness. Negative for dizziness.  Hematological: Negative for adenopathy.  Psychiatric/Behavioral: Positive for confusion. Negative for hallucinations and sleep disturbance. The patient is nervous/anxious.     Health Maintenance  Topic Date Due  . FOOT EXAM  03/13/1938  . OPHTHALMOLOGY EXAM  03/13/1938  . TETANUS/TDAP  03/14/1947  . ZOSTAVAX  03/13/1988  . HEMOGLOBIN A1C  09/30/2015  . INFLUENZA VACCINE  03/13/2016  . DEXA SCAN  Completed  . PNA vac Low Risk Adult  Completed    Physical Exam: Filed Vitals:   11/09/15 1542  BP: 110/64  Pulse: 82  Temp: 98.5 F (36.9 C)  TempSrc: Oral  Height: '5\' 3"'$  (1.6 m)  Weight: 136 lb (61.689 kg)  SpO2: 99%   Body mass index is 24.1 kg/(m^2). Physical Exam  Constitutional: She is oriented to person, place, and time. She appears well-developed and well-nourished. No distress.  Cardiovascular: Normal rate, regular rhythm, normal heart sounds and intact distal pulses.   Pulmonary/Chest: Effort normal and breath sounds normal. No respiratory distress. She has no wheezes. She has no rales.  Abdominal: Soft. Bowel sounds are normal. She exhibits mass. She exhibits no distension. There is tenderness. There is no rebound and no guarding.  LLQ palpable apricot sized mass, somewhat hard, tender in that region  Musculoskeletal: Normal range of motion.  Walking with rolling walker with skis  Neurological: She is alert and oriented to person, place, and time.  Skin: Skin is warm and dry.  Psychiatric: She has a normal mood and affect.    Labs reviewed: Basic Metabolic Panel:  Recent Labs  03/30/15 0813  06/15/15 0510  07/07/15 0530 07/08/15 0425 07/09/15 0500 07/18/15 0926 08/01/15 09/12/15  NA 139  < > 134*  < > 138 136 132* 138 137 133*  K 3.9  < > 4.6  < > 3.3* 3.7 3.6 3.5 3.1* 3.9  CL 98  < > 103  < > 108 105 101 98  --   --   CO2 22  < > 24  < > '26 25 26 25  '$ --   --   GLUCOSE  119*  < > 258*  < > 136* 128* 130* 132*  --   --   BUN 18  < > 19  < > '8 9 9 '$ 6*  --  16  CREATININE 1.24*  < > 1.54*  < > 0.74 0.88 0.85 0.92 0.9 1.3*  CALCIUM 9.8  < > 9.0  < > 8.6* 8.7* 8.6* 9.9  --   --   MG  --   --  1.5*  --  1.5* 1.9  --   --   --   --   PHOS  --   < > 3.7  --  1.8* 2.4* 2.6  --   --   --   TSH 1.640  --   --   --   --   --   --  1.410  --   --   < > = values in this interval not displayed. Liver Function Tests:  Recent Labs  07/02/15 0026 07/07/15 0530 07/18/15 0926  AST 24 14* 21  ALT 17 13* 11  ALKPHOS 84 66 92  BILITOT 0.6 0.5 0.3  PROT 6.9 5.1* 6.4  ALBUMIN 3.0* 2.0* 2.9*    Recent Labs  06/18/15 1126  LIPASE 27    Recent Labs  06/18/15 1126 06/19/15 0410  AMMONIA 19 20   CBC:  Recent Labs  07/02/15 0026  07/07/15 0530 07/09/15 0500 07/18/15 0926 08/01/15  WBC 14.5*  < > 12.6* 11.1* 8.9 8.2  NEUTROABS 10.8*  --  9.0*  --  6.6  --   HGB 11.4*  < > 9.5* 10.1*  --  11.0*  HCT 33.9*  < > 28.7* 30.7* 33.3* 34*  MCV 84.5  < > 84.2 84.3 85  --   PLT 386  < > 219 215 306 304  < > = values  in this interval not displayed. Lipid Panel:  Recent Labs  03/30/15 0813 07/07/15 0545  CHOL 264*  --   HDL 76  --   LDLCALC 154*  --   TRIG 171* 83  CHOLHDL 3.5  --    Lab Results  Component Value Date   HGBA1C 6.6* 03/30/2015    Procedures since last visit: Portable US in [facility] chart reviewed. Labs reviewed. Vitals in nursing notes reviewed.  Assessment/Plan 1. Colonic diverticular abscess -suspected cause of fluid collection in LLQ -pain and diarrhea have improved, but some pain still present and also pt has palpable mass still there on examination -will renew her flagyl and cipro for another 10 days -keep appt scheduled with Dr. Barry Dienes  -cont to monitor vitals for fever (but suspect some degree of immunocompromise with recent liver ca)  -i considered a CT or f/u US, but decided that since I could feel the mass still there, this  would not be very helpful and it would likely get done at the time of her surgery appt when we could see if another 10 days of abx was effective  2. Adenocarcinoma of liver s/p partial right hepatic lobectomy 06/14/2015 -has had some improvement in appetite and intake, but still not eating three regular meals on her own w/o staff's wholehearted encouragement  3. Controlled type 2 diabetes mellitus without complication, without long-term current use of insulin ([place]) -doing just fine on diet and minimal exercise regimen -does walk with her walker some and attend activities  4. Mild cognitive impairment with memory loss -encouraged socialization, activities, to keep her mind and body active -has some early dementia truly as she has difficulty at times operating remotes, phones, etc. Which she previously was able to do; has never fully returned to baseline MCI state since her surgery/liver cancer diagnosis  5. Essential hypertension -bp is at goal with current meds, no hypotension or dizziness -cont aldactone, was not tolerating losartan well (family felt it was the cause of her increased confusion--I also lowered her ativan to bid during a home visit b/c I thought it was the cause of her worsening cognition and it had gotten gradually increased due to phone calls to other providers--next MD could try to taper off after she adjusts to her new location--seems her anxiety had a lot to do with her husband's worsening cognitive state and needs)   Labs/tests ordered: no new Next appt:  12/07/2015--will need to cancel this unless pt is still at [facility] at that point  [place]. Cejay Cambre, D.O. [facility] [address], [place] [address] Cell Phone (Mon-Fri 8am-5pm):  [phone] On Call:  [phone] & follow prompts after 5pm & weekends Office Phone:  [phone] Office Fax:  [phone]

## 2015-11-15 ENCOUNTER — Other Ambulatory Visit: Payer: Self-pay

## 2015-11-15 DIAGNOSIS — F411 Generalized anxiety disorder: Secondary | ICD-10-CM | POA: Insufficient documentation

## 2015-11-16 ENCOUNTER — Telehealth: Payer: Self-pay | Admitting: *Deleted

## 2015-11-16 NOTE — Telephone Encounter (Signed)
Received fax from [facility] at [facility] stating patient was admitted to the community yesterday and they need clarification on the [facility] form that was filled out. Given to Dr. Mariea Clonts to review and correct. Needs faxed back to [facility] Fax: [phone]

## 2015-11-17 ENCOUNTER — Other Ambulatory Visit: Payer: Self-pay | Admitting: General Surgery

## 2015-11-17 ENCOUNTER — Other Ambulatory Visit: Payer: Self-pay | Admitting: Internal Medicine

## 2015-11-17 DIAGNOSIS — C229 Malignant neoplasm of liver, not specified as primary or secondary: Secondary | ICD-10-CM

## 2015-11-17 NOTE — Addendum Note (Signed)
Addended by: Despina Hidden on: 11/17/2015 03:25 PM   Modules accepted: Orders, Medications

## 2015-11-17 NOTE — Telephone Encounter (Signed)
Corrected med list and faxed to [facility] at [facility]

## 2015-11-21 ENCOUNTER — Other Ambulatory Visit (HOSPITAL_COMMUNITY): Payer: Self-pay | Admitting: General Surgery

## 2015-11-21 DIAGNOSIS — C229 Malignant neoplasm of liver, not specified as primary or secondary: Secondary | ICD-10-CM

## 2015-11-23 ENCOUNTER — Ambulatory Visit (HOSPITAL_COMMUNITY)
Admission: RE | Admit: 2015-11-23 | Discharge: 2015-11-23 | Disposition: A | Discharge status: Home / Self Care | Payer: Medicare Other | Source: Ambulatory Visit | Attending: General Surgery | Admitting: General Surgery

## 2015-11-23 ENCOUNTER — Encounter (HOSPITAL_COMMUNITY): Payer: Self-pay

## 2015-11-23 DIAGNOSIS — R935 Abnormal findings on diagnostic imaging of other abdominal regions, including retroperitoneum: Secondary | ICD-10-CM | POA: Diagnosis not present

## 2015-11-23 DIAGNOSIS — C229 Malignant neoplasm of liver, not specified as primary or secondary: Secondary | ICD-10-CM | POA: Diagnosis not present

## 2015-11-23 DIAGNOSIS — R1032 Left lower quadrant pain: Secondary | ICD-10-CM | POA: Diagnosis not present

## 2015-11-23 MED ORDER — IOPAMIDOL (ISOVUE-300) INJECTION 61%
100.0000 mL | Freq: Once | INTRAVENOUS | Status: AC | PRN
  Administered 2015-11-23: 100 mL via INTRAVENOUS

## 2015-11-24 ENCOUNTER — Telehealth: Payer: Self-pay | Admitting: General Surgery

## 2015-11-24 NOTE — Telephone Encounter (Signed)
Discussed CT with patient's daughter.    Pt had colonoscopy with Dr. [name] back in the fall that was negative.  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    Will discuss with Dr. [name] or other GI doc.  Pt may have refractory diverticulitis or ischemic colitis.  Just finished antibiotics.

## 2015-11-25 ENCOUNTER — Other Ambulatory Visit: Payer: Medicare Other

## 2015-11-30 ENCOUNTER — Other Ambulatory Visit: Payer: Self-pay | Admitting: General Surgery

## 2015-11-30 ENCOUNTER — Telehealth: Payer: Self-pay | Admitting: Hematology and Oncology

## 2015-11-30 ENCOUNTER — Encounter: Payer: Self-pay | Admitting: Hematology and Oncology

## 2015-11-30 ENCOUNTER — Ambulatory Visit (HOSPITAL_BASED_OUTPATIENT_CLINIC_OR_DEPARTMENT_OTHER): Payer: Medicare Other | Admitting: Hematology and Oncology

## 2015-11-30 ENCOUNTER — Other Ambulatory Visit (HOSPITAL_BASED_OUTPATIENT_CLINIC_OR_DEPARTMENT_OTHER): Payer: Medicare Other

## 2015-11-30 DIAGNOSIS — C227 Other specified carcinomas of liver: Secondary | ICD-10-CM

## 2015-11-30 DIAGNOSIS — N824 Other female intestinal-genital tract fistulae: Secondary | ICD-10-CM

## 2015-11-30 DIAGNOSIS — N828 Other female genital tract fistulae: Secondary | ICD-10-CM

## 2015-11-30 DIAGNOSIS — C50919 Malignant neoplasm of unspecified site of unspecified female breast: Secondary | ICD-10-CM | POA: Diagnosis not present

## 2015-11-30 DIAGNOSIS — C229 Malignant neoplasm of liver, not specified as primary or secondary: Secondary | ICD-10-CM

## 2015-11-30 LAB — CBC WITH DIFFERENTIAL/PLATELET
BASO%: 0.8 % (ref 0.0–2.0)
BASOS ABS: 0.1 10*3/uL (ref 0.0–0.1)
EOS%: 2.4 % (ref 0.0–7.0)
Eosinophils Absolute: 0.2 10*3/uL (ref 0.0–0.5)
HEMATOCRIT: 36.2 % (ref 34.8–46.6)
HGB: 11.7 g/dL (ref 11.6–15.9)
LYMPH%: 20.6 % (ref 14.0–49.7)
MCH: 26.1 pg (ref 25.1–34.0)
MCHC: 32.3 g/dL (ref 31.5–36.0)
MCV: 80.6 fL (ref 79.5–101.0)
MONO#: 0.5 10*3/uL (ref 0.1–0.9)
MONO%: 8 % (ref 0.0–14.0)
NEUT#: 4.4 10*3/uL (ref 1.5–6.5)
NEUT%: 68.2 % (ref 38.4–76.8)
Platelets: 264 10*3/uL (ref 145–400)
RBC: 4.49 10*6/uL (ref 3.70–5.45)
RDW: 16.8 % — ABNORMAL HIGH (ref 11.2–14.5)
WBC: 6.4 10*3/uL (ref 3.9–10.3)
lymph#: 1.3 10*3/uL (ref 0.9–3.3)

## 2015-11-30 LAB — COMPREHENSIVE METABOLIC PANEL
ALBUMIN: 2.7 g/dL — AB (ref 3.5–5.0)
ALK PHOS: 66 U/L (ref 40–150)
ALT: <9 U/L (ref 0–55)
ANION GAP: 6 meq/L (ref 3–11)
AST: 14 U/L (ref 5–34)
BILIRUBIN TOTAL: 0.31 mg/dL (ref 0.20–1.20)
BUN: 13.3 mg/dL (ref 7.0–26.0)
CO2: 28 mEq/L (ref 22–29)
Calcium: 10.1 mg/dL (ref 8.4–10.4)
Chloride: 104 mEq/L (ref 98–109)
Creatinine: 1 mg/dL (ref 0.6–1.1)
EGFR: 50 mL/min/{1.73_m2} — AB (ref >90)
Glucose: 122 mg/dl (ref 70–140)
Potassium: 3.3 mEq/L — ABNORMAL LOW (ref 3.5–5.1)
Sodium: 138 mEq/L (ref 136–145)
TOTAL PROTEIN: 7.5 g/dL (ref 6.4–8.3)

## 2015-11-30 NOTE — Telephone Encounter (Signed)
appt made and avs printed °

## 2015-11-30 NOTE — Assessment & Plan Note (Signed)
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 Recent diverticulitis treated with antibiotics for 3 weeks in April 2017  Plan: 1. Follow-up in 6 months with labs. 2. Follow-up with Dr. Barry Dienes regarding the fistula. Patient has an appointment for a barium enema. I discussed the CT scan in great detail and provided the patient with a copy.

## 2015-11-30 NOTE — Progress Notes (Signed)
Patient Care Team: Gayland Curry, DO as PCP - General (Geriatric Medicine) [name], MD as Consulting Physician (General Surgery)  DIAGNOSIS: No matching staging information was found for the patient.  SUMMARY OF ONCOLOGIC HISTORY:   Adenocarcinoma of liver s/p partial right hepatic lobectomy 06/14/2015   05/03/2015 Imaging CT abdomen: Bilobed 8.1 cm mass in the liver segment 5. 3.8 cm fluid collection posterior and medial: Favoring adnexal cyst   05/04/2015 Initial Diagnosis Poorly differentiated carcinoma strongly positive for CK 8/18, CK 7 and MOC-31; HPAR faint cytoplasmic staining; (negative for ER, PR, AFP, TTF-1, WT 1, p63, CD10, CDX 2, CK 20, CK 5/6, mucicarmine)   05/17/2015 PET scan Hypermetabolic partially necrotic right liver lobe mass , hypermetabolic some within the descending/sigmoid colon junction in the region of inflammation/ diverticulitis versus cancer   06/14/2015 Surgery liver partial resection segment 7 and 8: Poorly differentiated carcinoma 8.6 cm, margins negative,positive for ck8/18, ck 7, partially positive for ck20, polyclonalCEA, cd10 and negative for HEPAR, AFP, Monoclonal CEA, WT-1, cdx-2, ER, PR, GCDFP and TTF   06/14/2015 Procedure cancer type ID: 96% pancreaticobiliary origin, 60% cholangiocarcinoma, 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 She does me that she had a profound episode of sigmoid diverticulitis for which she has been on antibiotics for the last month. She had intractable left lower quadrant abdominal pain which has improved. She does not have any current abdominal pain.  REVIEW OF SYSTEMS:   Constitutional: Denies fevers, chills or abnormal weight loss Eyes: Denies blurriness of vision Ears, nose, mouth, throat, and  face: Denies mucositis or sore throat Respiratory: Denies cough, dyspnea or wheezes Cardiovascular: Denies palpitation, chest discomfort Gastrointestinal:  Recent diverticulitis has improved. Bowels moving normally Skin: Denies abnormal skin rashes Lymphatics: Denies new lymphadenopathy or easy bruising Neurological:Denies numbness, tingling or new weaknesses Behavioral/Psych: Mood is stable, no new changes  Extremities: Generalized weakness requiring a walker Breast:  denies any pain or lumps or nodules in either breasts All other systems were reviewed with the patient and are negative.  I have reviewed the past medical history, past surgical history, social history and family history with the patient and they are unchanged from previous note.  ALLERGIES:  is allergic to levaquin.  MEDICATIONS:  Current Outpatient Prescriptions  Medication Sig Dispense Refill  . acetaminophen (TYLENOL) 325 MG tablet Take 650 mg by mouth every 6 (six) hours as needed (pain).     Marland Kitchen amLODipine (NORVASC) 5 MG tablet Take 5 mg by mouth daily.    Marland Kitchen aspirin 81 MG tablet Take 81 mg by mouth daily.     . feeding supplement (BOOST / RESOURCE BREEZE) LIQD Take 1 Container by mouth 2 (two) times daily between meals.     Marland Kitchen levothyroxine (SYNTHROID, LEVOTHROID) 100 MCG tablet Take 100 mcg by mouth daily before breakfast.    . LORazepam (ATIVAN) 0.5 MG tablet Take 0.5 mg by mouth 2 (two) times daily as needed for anxiety.     . mirtazapine (REMERON) 7.5 MG tablet Take 7.5 mg by mouth at bedtime.    . ondansetron (ZOFRAN) 4 MG tablet Take 4 mg by mouth daily as needed for nausea or vomiting.    . pantoprazole (PROTONIX) 40 MG tablet Take 40 mg by mouth 2 (two) times daily before a meal.    .  Probiotic Product (PROBIOTIC DAILY PO) Take 1 tablet by mouth daily.     . ranitidine (ZANTAC) 150 MG capsule Take 150 mg by mouth daily as needed (indigestion).    . simethicone (MYLICON) 623 MG chewable tablet Chew 125 mg by  mouth 3 (three) times daily before meals.     No current facility-administered medications for this visit.    PHYSICAL EXAMINATION: ECOG PERFORMANCE STATUS: 1 - Symptomatic but completely ambulatory  Filed Vitals:   11/30/15 1049  BP: 133/57  Pulse: 78  Temp: 97.8 F (36.6 C)  Resp: 17   Filed Weights   11/30/15 1049  Weight: 135 lb 3.2 oz (61.326 kg)    GENERAL:alert, no distress and comfortable SKIN: skin color, texture, turgor are normal, no rashes or significant lesions EYES: normal, Conjunctiva are pink and non-injected, sclera clear OROPHARYNX:no exudate, no erythema and lips, buccal mucosa, and tongue normal  NECK: supple, thyroid normal size, non-tender, without nodularity LYMPH:  no palpable lymphadenopathy in the cervical, axillary or inguinal LUNGS: clear to auscultation and percussion with normal breathing effort HEART: regular rate & rhythm and no murmurs and no lower extremity edema ABDOMEN:abdomen soft, non-tender and normal bowel sounds MUSCULOSKELETAL:no cyanosis of digits and no clubbing  NEURO: alert & oriented x 3 with fluent speech, no focal motor/sensory deficits EXTREMITIES: No lower extremity edema  LABORATORY DATA:  I have reviewed the data as listed   Chemistry      Component Value Date/Time   NA 138 11/30/2015 1033   NA 134* 10/27/2015   NA 132* 07/09/2015 0500   K 3.3* 11/30/2015 1033   K 4.1 10/27/2015   CL 98 07/18/2015 0926   CO2 28 11/30/2015 1033   CO2 25 07/18/2015 0926   BUN 13.3 11/30/2015 1033   BUN 21 10/27/2015   BUN 9 07/09/2015 0500   CREATININE 1.0 11/30/2015 1033   CREATININE 1.1 10/27/2015   CREATININE 0.92 07/18/2015 0926   GLU 178 10/27/2015      Component Value Date/Time   CALCIUM 10.1 11/30/2015 1033   CALCIUM 9.9 07/18/2015 0926   ALKPHOS 66 11/30/2015 1033   ALKPHOS 92 07/18/2015 0926   AST 14 11/30/2015 1033   AST 21 07/18/2015 0926   ALT <9 11/30/2015 1033   ALT 11 07/18/2015 0926   BILITOT 0.31  11/30/2015 1033   BILITOT 0.3 07/18/2015 0926   BILITOT 0.5 07/07/2015 0530       Lab Results  Component Value Date   WBC 6.4 11/30/2015   HGB 11.7 11/30/2015   HCT 36.2 11/30/2015   MCV 80.6 11/30/2015   PLT 264 11/30/2015   NEUTROABS 4.4 11/30/2015     ASSESSMENT & PLAN:  Adenocarcinoma of liver s/p partial right hepatic lobectomy 06/14/2015 Liver partial resection segment 7 and 8 06/14/2015: Poorly differentiated carcinoma 8.6 cm, margins negative,positive for ck8/18, ck 7, partially positive for ck20, polyclonalCEA, cd10 and negative for HEPAR, AFP, Monoclonal CEA, WT-1, cdx-2, ER, PR, GCDFP and TTF-1) Cancer type ID: 96% pancreaticobiliary origin, 60% cholangiocarcinoma, 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 Recent diverticulitis treated with antibiotics for 3 weeks in April 2017  Plan: 1. Follow-up in 6 months with labs. 2. Follow-up with Dr. Barry Dienes regarding the fistula. Patient has an appointment for a barium enema. I discussed the CT scan in great detail and provided  the patient with a copy.   Orders Placed This Encounter  Procedures  . CBC with Differential    Standing Status: Future     Number of Occurrences:      Standing Expiration Date: 11/29/2016  . Comprehensive metabolic panel    Standing Status: Future     Number of Occurrences:      Standing Expiration Date: 11/29/2016  . CA 19.9    Standing Status: Future     Number of Occurrences:      Standing Expiration Date: 11/29/2016   The patient has a good understanding of the overall plan. she agrees with it. she will call with any problems that may develop before the next visit here.   Rulon Eisenmenger, MD 11/30/2015

## 2015-11-30 NOTE — Progress Notes (Signed)
Unable to get in to exam room prior to MD.  No assessment performed.  

## 2015-12-01 LAB — CANCER ANTIGEN 19-9 (PARALLEL TESTING): CA 19 9: 34 U/mL — AB (ref <34)

## 2015-12-01 LAB — CANCER ANTIGEN 19-9: CA 19-9: 39 U/mL — ABNORMAL HIGH (ref 0–35)

## 2015-12-02 ENCOUNTER — Ambulatory Visit
Admission: RE | Admit: 2015-12-02 | Discharge: 2015-12-02 | Disposition: A | Discharge status: Home / Self Care | Payer: Medicare Other | Source: Ambulatory Visit | Attending: General Surgery | Admitting: General Surgery

## 2015-12-02 ENCOUNTER — Telehealth: Payer: Self-pay

## 2015-12-02 DIAGNOSIS — C229 Malignant neoplasm of liver, not specified as primary or secondary: Secondary | ICD-10-CM | POA: Diagnosis not present

## 2015-12-02 DIAGNOSIS — K6389 Other specified diseases of intestine: Secondary | ICD-10-CM | POA: Diagnosis not present

## 2015-12-02 DIAGNOSIS — N828 Other female genital tract fistulae: Secondary | ICD-10-CM

## 2015-12-02 DIAGNOSIS — N824 Other female intestinal-genital tract fistulae: Secondary | ICD-10-CM

## 2015-12-02 NOTE — Telephone Encounter (Signed)
Ms. Destiny Velasquez reports Dr. Lindi Adie had called her with results.  She voiced appreciation for the call.

## 2015-12-05 ENCOUNTER — Encounter (HOSPITAL_BASED_OUTPATIENT_CLINIC_OR_DEPARTMENT_OTHER): Payer: Self-pay | Admitting: *Deleted

## 2015-12-05 ENCOUNTER — Emergency Department (HOSPITAL_BASED_OUTPATIENT_CLINIC_OR_DEPARTMENT_OTHER)
Admission: EM | Admit: 2015-12-05 | Discharge: 2015-12-05 | Disposition: A | Discharge status: Home / Self Care | Payer: Medicare Other | Attending: Emergency Medicine | Admitting: Emergency Medicine

## 2015-12-05 DIAGNOSIS — R05 Cough: Secondary | ICD-10-CM | POA: Insufficient documentation

## 2015-12-05 DIAGNOSIS — I10 Essential (primary) hypertension: Secondary | ICD-10-CM | POA: Insufficient documentation

## 2015-12-05 NOTE — ED Notes (Signed)
She aspirated while taking a pill this am.

## 2015-12-07 ENCOUNTER — Encounter: Payer: Medicare Other | Admitting: Internal Medicine

## 2015-12-12 DIAGNOSIS — K5732 Diverticulitis of large intestine without perforation or abscess without bleeding: Secondary | ICD-10-CM | POA: Diagnosis not present

## 2015-12-12 DIAGNOSIS — K5909 Other constipation: Secondary | ICD-10-CM | POA: Diagnosis not present

## 2015-12-17 DIAGNOSIS — R1084 Generalized abdominal pain: Secondary | ICD-10-CM | POA: Diagnosis not present

## 2015-12-17 DIAGNOSIS — I129 Hypertensive chronic kidney disease with stage 1 through stage 4 chronic kidney disease, or unspecified chronic kidney disease: Secondary | ICD-10-CM | POA: Diagnosis not present

## 2015-12-18 DIAGNOSIS — I129 Hypertensive chronic kidney disease with stage 1 through stage 4 chronic kidney disease, or unspecified chronic kidney disease: Secondary | ICD-10-CM | POA: Diagnosis not present

## 2015-12-24 DIAGNOSIS — R262 Difficulty in walking, not elsewhere classified: Secondary | ICD-10-CM | POA: Diagnosis not present

## 2015-12-24 DIAGNOSIS — N189 Chronic kidney disease, unspecified: Secondary | ICD-10-CM | POA: Diagnosis not present

## 2015-12-24 DIAGNOSIS — E119 Type 2 diabetes mellitus without complications: Secondary | ICD-10-CM | POA: Diagnosis not present

## 2015-12-24 DIAGNOSIS — C229 Malignant neoplasm of liver, not specified as primary or secondary: Secondary | ICD-10-CM | POA: Diagnosis not present

## 2015-12-29 DIAGNOSIS — N189 Chronic kidney disease, unspecified: Secondary | ICD-10-CM | POA: Diagnosis not present

## 2015-12-29 DIAGNOSIS — E119 Type 2 diabetes mellitus without complications: Secondary | ICD-10-CM | POA: Diagnosis not present

## 2015-12-29 DIAGNOSIS — C229 Malignant neoplasm of liver, not specified as primary or secondary: Secondary | ICD-10-CM | POA: Diagnosis not present

## 2015-12-29 DIAGNOSIS — R262 Difficulty in walking, not elsewhere classified: Secondary | ICD-10-CM | POA: Diagnosis not present

## 2016-01-02 DIAGNOSIS — N189 Chronic kidney disease, unspecified: Secondary | ICD-10-CM | POA: Diagnosis not present

## 2016-01-02 DIAGNOSIS — R262 Difficulty in walking, not elsewhere classified: Secondary | ICD-10-CM | POA: Diagnosis not present

## 2016-01-02 DIAGNOSIS — E119 Type 2 diabetes mellitus without complications: Secondary | ICD-10-CM | POA: Diagnosis not present

## 2016-01-02 DIAGNOSIS — C229 Malignant neoplasm of liver, not specified as primary or secondary: Secondary | ICD-10-CM | POA: Diagnosis not present

## 2016-01-05 DIAGNOSIS — R110 Nausea: Secondary | ICD-10-CM | POA: Diagnosis not present

## 2016-01-05 DIAGNOSIS — R938 Abnormal findings on diagnostic imaging of other specified body structures: Secondary | ICD-10-CM | POA: Diagnosis not present

## 2016-01-10 DIAGNOSIS — N189 Chronic kidney disease, unspecified: Secondary | ICD-10-CM | POA: Diagnosis not present

## 2016-01-10 DIAGNOSIS — C229 Malignant neoplasm of liver, not specified as primary or secondary: Secondary | ICD-10-CM | POA: Diagnosis not present

## 2016-01-10 DIAGNOSIS — R262 Difficulty in walking, not elsewhere classified: Secondary | ICD-10-CM | POA: Diagnosis not present

## 2016-01-10 DIAGNOSIS — E119 Type 2 diabetes mellitus without complications: Secondary | ICD-10-CM | POA: Diagnosis not present

## 2016-01-12 DIAGNOSIS — E119 Type 2 diabetes mellitus without complications: Secondary | ICD-10-CM | POA: Diagnosis not present

## 2016-01-12 DIAGNOSIS — N189 Chronic kidney disease, unspecified: Secondary | ICD-10-CM | POA: Diagnosis not present

## 2016-01-12 DIAGNOSIS — C229 Malignant neoplasm of liver, not specified as primary or secondary: Secondary | ICD-10-CM | POA: Diagnosis not present

## 2016-01-12 DIAGNOSIS — R262 Difficulty in walking, not elsewhere classified: Secondary | ICD-10-CM | POA: Diagnosis not present

## 2016-01-13 DIAGNOSIS — K5732 Diverticulitis of large intestine without perforation or abscess without bleeding: Secondary | ICD-10-CM | POA: Diagnosis not present

## 2016-01-17 DIAGNOSIS — C229 Malignant neoplasm of liver, not specified as primary or secondary: Secondary | ICD-10-CM | POA: Diagnosis not present

## 2016-01-17 DIAGNOSIS — N189 Chronic kidney disease, unspecified: Secondary | ICD-10-CM | POA: Diagnosis not present

## 2016-01-17 DIAGNOSIS — E119 Type 2 diabetes mellitus without complications: Secondary | ICD-10-CM | POA: Diagnosis not present

## 2016-01-17 DIAGNOSIS — R262 Difficulty in walking, not elsewhere classified: Secondary | ICD-10-CM | POA: Diagnosis not present

## 2016-01-19 ENCOUNTER — Encounter (HOSPITAL_BASED_OUTPATIENT_CLINIC_OR_DEPARTMENT_OTHER): Payer: Self-pay | Admitting: *Deleted

## 2016-01-19 ENCOUNTER — Telehealth: Payer: Self-pay | Admitting: *Deleted

## 2016-01-19 ENCOUNTER — Emergency Department (HOSPITAL_BASED_OUTPATIENT_CLINIC_OR_DEPARTMENT_OTHER)
Admission: EM | Admit: 2016-01-19 | Discharge: 2016-01-19 | Disposition: A | Discharge status: Home / Self Care | Payer: Medicare Other | Attending: Emergency Medicine | Admitting: Emergency Medicine

## 2016-01-19 DIAGNOSIS — Y939 Activity, unspecified: Secondary | ICD-10-CM | POA: Diagnosis not present

## 2016-01-19 DIAGNOSIS — Z853 Personal history of malignant neoplasm of breast: Secondary | ICD-10-CM | POA: Diagnosis not present

## 2016-01-19 DIAGNOSIS — I10 Essential (primary) hypertension: Secondary | ICD-10-CM | POA: Diagnosis not present

## 2016-01-19 DIAGNOSIS — S0181XA Laceration without foreign body of other part of head, initial encounter: Secondary | ICD-10-CM | POA: Diagnosis not present

## 2016-01-19 DIAGNOSIS — Z7982 Long term (current) use of aspirin: Secondary | ICD-10-CM | POA: Insufficient documentation

## 2016-01-19 DIAGNOSIS — Z87891 Personal history of nicotine dependence: Secondary | ICD-10-CM | POA: Diagnosis not present

## 2016-01-19 DIAGNOSIS — E039 Hypothyroidism, unspecified: Secondary | ICD-10-CM | POA: Diagnosis not present

## 2016-01-19 DIAGNOSIS — Y999 Unspecified external cause status: Secondary | ICD-10-CM | POA: Diagnosis not present

## 2016-01-19 DIAGNOSIS — S0990XA Unspecified injury of head, initial encounter: Secondary | ICD-10-CM | POA: Diagnosis present

## 2016-01-19 DIAGNOSIS — Y929 Unspecified place or not applicable: Secondary | ICD-10-CM | POA: Diagnosis not present

## 2016-01-19 DIAGNOSIS — E785 Hyperlipidemia, unspecified: Secondary | ICD-10-CM | POA: Diagnosis not present

## 2016-01-19 DIAGNOSIS — Z23 Encounter for immunization: Secondary | ICD-10-CM | POA: Diagnosis not present

## 2016-01-19 DIAGNOSIS — Z79899 Other long term (current) drug therapy: Secondary | ICD-10-CM | POA: Insufficient documentation

## 2016-01-19 DIAGNOSIS — S0180XA Unspecified open wound of other part of head, initial encounter: Secondary | ICD-10-CM | POA: Diagnosis not present

## 2016-01-19 DIAGNOSIS — S0191XA Laceration without foreign body of unspecified part of head, initial encounter: Secondary | ICD-10-CM

## 2016-01-19 DIAGNOSIS — W01198A Fall on same level from slipping, tripping and stumbling with subsequent striking against other object, initial encounter: Secondary | ICD-10-CM | POA: Diagnosis not present

## 2016-01-19 DIAGNOSIS — Z8505 Personal history of malignant neoplasm of liver: Secondary | ICD-10-CM | POA: Diagnosis not present

## 2016-01-19 MED ORDER — TETANUS-DIPHTH-ACELL PERTUSSIS 5-2.5-18.5 LF-MCG/0.5 IM SUSP
0.5000 mL | Freq: Once | INTRAMUSCULAR | Status: AC
  Administered 2016-01-19: 0.5 mL via INTRAMUSCULAR
  Filled 2016-01-19: qty 0.5

## 2016-01-19 NOTE — ED Notes (Signed)
MD at bedside for wound closure with dermabond

## 2016-01-19 NOTE — Discharge Instructions (Signed)
Facial Laceration  A facial laceration is a cut on the face. These injuries can be painful and cause bleeding. Lacerations usually heal quickly, but they need special care to reduce scarring. DIAGNOSIS  Your health care provider will take a medical history, ask for details about how the injury occurred, and examine the wound to determine how deep the cut is. TREATMENT  Some facial lacerations may not require closure. Others may not be able to be closed because of an increased risk of infection. The risk of infection and the chance for successful closure will depend on various factors, including the amount of time since the injury occurred. The wound may be cleaned to help prevent infection. If closure is appropriate, pain medicines may be given if needed. Your health care provider will use stitches (sutures), wound glue (adhesive), or skin adhesive strips to repair the laceration. These tools bring the skin edges together to allow for faster healing and a better cosmetic outcome. If needed, you may also be given a tetanus shot. HOME CARE INSTRUCTIONS  Only take over-the-counter or prescription medicines as directed by your health care provider.  Follow your health care provider's instructions for wound care. These instructions will vary depending on the technique used for closing the wound. For Wound Adhesive:  You may briefly wet your wound in the shower or bath. Do not soak or scrub the wound. Do not swim. Avoid periods of heavy sweating until the skin adhesive has fallen off on its own. After showering or bathing, gently pat the wound dry with a clean towel.   Do not apply liquid medicine, cream medicine, ointment medicine, or makeup to your wound while the skin adhesive is in place. This may loosen the film before your wound is healed.   If a dressing is placed over the wound, be careful not to apply tape directly over the skin adhesive. This may cause the adhesive to be pulled off before the  wound is healed.   Avoid prolonged exposure to sunlight or tanning lamps while the skin adhesive is in place.  The skin adhesive will usually remain in place for 5-10 days, then naturally fall off the skin. Do not pick at the adhesive film.  After Healing: Once the wound has healed, cover the wound with sunscreen during the day for 1 full year. This can help minimize scarring. Exposure to ultraviolet light in the first year will darken the scar. It can take 1-2 years for the scar to lose its redness and to heal completely.  SEEK MEDICAL CARE IF:  You have a fever. SEEK IMMEDIATE MEDICAL CARE IF:  You have redness, pain, or swelling around the wound.   You see ayellowish-white fluid (pus) coming from the wound.    This information is not intended to replace advice given to you by your health care provider. Make sure you discuss any questions you have with your health care provider.   Document Released: 09/06/2004 Document Revised: 08/20/2014 Document Reviewed: 03/12/2013 Elsevier Interactive Patient Education 2016 [place], Adult A laceration is a cut that goes through all of the layers of the skin and into the tissue that is right under the skin. Some lacerations heal on their own. Others need to be closed with stitches (sutures), staples, skin adhesive strips, or skin glue. Proper laceration care minimizes the risk of infection and helps the laceration to heal better. HOW TO CARE FOR YOUR LACERATION If sutures or staples were used:  Keep the wound clean  and dry.  If you were given a bandage (dressing), you should change it at least one time per day or as told by your health care provider. You should also change it if it becomes wet or dirty.  Keep the wound completely dry for the first 24 hours or as told by your health care provider. After that time, you may shower or bathe. However, make sure that the wound is not soaked in water until after the sutures or  staples have been removed.  Clean the wound one time each day or as told by your health care provider:  Wash the wound with soap and water.  Rinse the wound with water to remove all soap.  Pat the wound dry with a clean towel. Do not rub the wound.  After cleaning the wound, apply a thin layer of antibiotic ointmentas told by your health care provider. This will help to prevent infection and keep the dressing from sticking to the wound.  Have the sutures or staples removed as told by your health care provider. If skin adhesive strips were used:  Keep the wound clean and dry.  If you were given a bandage (dressing), you should change it at least one time per day or as told by your health care provider. You should also change it if it becomes dirty or wet.  Do not get the skin adhesive strips wet. You may shower or bathe, but be careful to keep the wound dry.  If the wound gets wet, pat it dry with a clean towel. Do not rub the wound.  Skin adhesive strips fall off on their own. You may trim the strips as the wound heals. Do not remove skin adhesive strips that are still stuck to the wound. They will fall off in time. If skin glue was used:  Try to keep the wound dry, but you may briefly wet it in the shower or bath. Do not soak the wound in water, such as by swimming.  After you have showered or bathed, gently pat the wound dry with a clean towel. Do not rub the wound.  Do not do any activities that will make you sweat heavily until the skin glue has fallen off on its own.  Do not apply liquid, cream, or ointment medicine to the wound while the skin glue is in place. Using those may loosen the film before the wound has healed.  If you were given a bandage (dressing), you should change it at least one time per day or as told by your health care provider. You should also change it if it becomes dirty or wet.  If a dressing is placed over the wound, be careful not to apply tape  directly over the skin glue. Doing that may cause the glue to be pulled off before the wound has healed.  Do not pick at the glue. The skin glue usually remains in place for 5-10 days, then it falls off of the skin. General Instructions  Take over-the-counter and prescription medicines only as told by your health care provider.  If you were prescribed an antibiotic medicine or ointment, take or apply it as told by your doctor. Do not stop using it even if your condition improves.  To help prevent scarring, make sure to cover your wound with sunscreen whenever you are outside after stitches are removed, after adhesive strips are removed, or when glue remains in place and the wound is healed. Make sure to wear a  sunscreen of at least 30 SPF.  Do not scratch or pick at the wound.  Keep all follow-up visits as told by your health care provider. This is important.  Check your wound every day for signs of infection. Watch for:  Redness, swelling, or pain.  Fluid, blood, or pus.  Raise (elevate) the injured area above the level of your heart while you are sitting or lying down, if possible. SEEK MEDICAL CARE IF:  You received a tetanus shot and you have swelling, severe pain, redness, or bleeding at the injection site.  You have a fever.  A wound that was closed breaks open.  You notice a bad smell coming from your wound or your dressing.  You notice something coming out of the wound, such as wood or glass.  Your pain is not controlled with medicine.  You have increased redness, swelling, or pain at the site of your wound.  You have fluid, blood, or pus coming from your wound.  You notice a change in the color of your skin near your wound.  You need to change the dressing frequently due to fluid, blood, or pus draining from the wound.  You develop a new rash.  You develop numbness around the wound. SEEK IMMEDIATE MEDICAL CARE IF:  You develop severe swelling around the  wound.  Your pain suddenly increases and is severe.  You develop painful lumps near the wound or on skin that is anywhere on your body.  You have a red streak going away from your wound.  The wound is on your hand or foot and you cannot properly move a finger or toe.  The wound is on your hand or foot and you notice that your fingers or toes look pale or bluish.   This information is not intended to replace advice given to you by your health care provider. Make sure you discuss any questions you have with your health care provider.   Document Released: 07/30/2005 Document Revised: 12/14/2014 Document Reviewed: 07/26/2014 Elsevier Interactive Patient Education [employer].

## 2016-01-19 NOTE — Telephone Encounter (Signed)
.  left message to have patient's daughter return my call to see if she's a patient here?

## 2016-01-19 NOTE — ED Notes (Signed)
MD at bedside. 

## 2016-01-19 NOTE — Telephone Encounter (Signed)
Please call Mrs. Domenico's daughter and find out if they are still coming here or following with the doctor at [facility]?      ----- Message -----     From: SYSTEM     Sent: 01/19/2016  8:33 AM      To: Gayland Curry, DO

## 2016-01-19 NOTE — ED Provider Notes (Signed)
CSN: [id number]     Arrival date & time 01/19/16  3419 History   First MD Initiated Contact with Patient 01/19/16 [phone]     Chief Complaint  Patient presents with  . Head Laceration   HPI Destiny Velasquez is a 82 y.o. female  presenting with head laceration. She is not on blood thinners. She states she was trying to catch her cat this morning when she stumbled on her slippers (not fully on her feet) and tripped, hitting her forehead on the corner of a wall. She denies loss of consciousness, she denies any severe headache or pain, denies weakness. Bleeding has been controlled with a towel. She would prefer to not get stitches if it can be avoided.    (Consider location/radiation/quality/duration/timing/severity/associated sxs/prior Treatment) Patient is a 82 y.o. female presenting with scalp laceration. The history is provided by the patient.  Head Laceration This is a new problem. The current episode started today. Episode frequency: once. Pertinent negatives include no abdominal pain, chest pain, congestion, fever, headaches, neck pain, numbness, rash, visual change, vomiting or weakness. Nothing aggravates the symptoms. She has tried nothing for the symptoms.    Past Medical History  Diagnosis Date  . Hypertension   . Hyperlipidemia   . Thyroid disease   . Subacute delirium   . Essential and other specified forms of tremor   . Unspecified hypothyroidism   . Diverticulosis of colon (without mention of hemorrhage)   . Unspecified constipation   . Anal and rectal polyp   . Disorder of bone and cartilage, unspecified   . Pneumonia X 1  . GERD (gastroesophageal reflux disease)   . Diverticulitis of large intestine without perforation or abscess without bleeding   . Malignant neoplasm of breast (female), unspecified site   . Breast cancer, right breast ([place]) 1971    "never had chemo or radiation"  . Liver cancer ([place]) 2016   Past Surgical History  Procedure Laterality Date  . Appendectomy     . Abdominal hysterectomy    . Tonsillectomy and adenoidectomy  1935  . Cholecystectomy    . Breast biopsy Right 1971  . Mastectomy Right 1971  . Tubal ligation    . Cataract extraction w/ intraocular lens  implant, bilateral Bilateral   . Rectal polypectomy  early 2000's  . Laparoscopy N/A 06/14/2015    Procedure: LAPAROSCOPY DIAGNOSTIC;  Surgeon: [name], MD;  Location: [facility];  Service: General;  Laterality: N/A;  . Open partial hepatectomy [83] N/A 06/14/2015    Procedure:  OPEN PARTIAL RIGHT HEPATECTOMY;  Surgeon: [name], MD;  Location: [facility];  Service: General;  Laterality: N/A;  converted to open @ 1447  . Liver biopsy  06/14/2015    Procedure: LIVER BIOPSY;  Surgeon: [name], MD;  Location: [facility];  Service: General;;   Family History  Problem Relation Age of Onset  . Cancer Mother   . Hypertension Father    Social History  Substance Use Topics  . Smoking status: Former Smoker -- 0.50 packs/day for 40 years    Types: Cigarettes    Quit date: 08/13/2012  . Smokeless tobacco: Never Used     Comment: "quit smoking cigarettes years ago; I was probably in my 16's"  . Alcohol Use: No     Comment: once per month    OB History    No data available     Review of Systems  Constitutional: Negative for fever.  HENT: Negative for congestion and trouble swallowing.  Eyes: Negative for pain.  Respiratory: Negative for shortness of breath.   Cardiovascular: Negative for chest pain and palpitations.  Gastrointestinal: Negative for vomiting and abdominal pain.  Genitourinary: Negative for dysuria.  Musculoskeletal: Negative for neck pain.  Skin: Negative for rash.  Neurological: Negative for dizziness, syncope, speech difficulty, weakness, light-headedness, numbness and headaches.  All other systems reviewed and are negative.     Allergies  Levaquin  Home Medications   Prior to Admission medications   Medication Sig Start Date End Date Taking?  Authorizing Provider  acetaminophen (TYLENOL) 325 MG tablet Take 650 mg by mouth every 6 (six) hours as needed (pain).     Historical Provider, MD  amLODipine (NORVASC) 5 MG tablet Take 5 mg by mouth daily.    Historical Provider, MD  aspirin 81 MG tablet Take 81 mg by mouth daily.     Historical Provider, MD  feeding supplement (BOOST / RESOURCE BREEZE) LIQD Take 1 Container by mouth 2 (two) times daily between meals.     Historical Provider, MD  levothyroxine (SYNTHROID, LEVOTHROID) 100 MCG tablet Take 100 mcg by mouth daily before breakfast.    Historical Provider, MD  LORazepam (ATIVAN) 0.5 MG tablet Take 0.5 mg by mouth 2 (two) times daily as needed for anxiety.     Historical Provider, MD  mirtazapine (REMERON) 7.5 MG tablet Take 7.5 mg by mouth at bedtime.    Historical Provider, MD  ondansetron (ZOFRAN) 4 MG tablet Take 4 mg by mouth daily as needed for nausea or vomiting.    Historical Provider, MD  pantoprazole (PROTONIX) 40 MG tablet Take 40 mg by mouth 2 (two) times daily before a meal.    Historical Provider, MD  Probiotic Product (PROBIOTIC DAILY PO) Take 1 tablet by mouth daily.     Historical Provider, MD  ranitidine (ZANTAC) 150 MG capsule Take 150 mg by mouth daily as needed (indigestion).    Historical Provider, MD  simethicone (MYLICON) 408 MG chewable tablet Chew 125 mg by mouth 3 (three) times daily before meals.    Historical Provider, MD   BP 138/61 mmHg  Pulse 74  Temp(Src) 98.3 F (36.8 C) (Oral)  Resp 18  Ht '5\' 3"'$  (1.6 m)  Wt 58.968 kg  BMI 23.03 kg/m2  SpO2 99% Physical Exam  Constitutional: She is oriented to person, place, and time. She appears well-developed and well-nourished.  HENT:  Head:    Eyes: Conjunctivae and EOM are normal. Pupils are equal, round, and reactive to light.  Neck: Normal range of motion. Neck supple.  Cardiovascular: Normal rate, regular rhythm, normal heart sounds and intact distal pulses.   Pulmonary/Chest: Effort normal and  breath sounds normal. No respiratory distress.  Musculoskeletal: She exhibits no edema.  Neurological: She is alert and oriented to person, place, and time.  Skin: Skin is warm and dry.  Psychiatric: She has a normal mood and affect. Thought content normal.  Nursing note and vitals reviewed.   ED Course  Procedures (including critical care time) Labs Review Labs Reviewed - No data to display  Imaging Review No results found. I have personally reviewed and evaluated these images and lab results as part of my medical decision-making.   EKG Interpretation None     Laceration repair middle forehead 6cm vertical laceration: Cleaned with NS irrigation Dermabond applied with good approximation. Minimal bleeding <1cc  MDM   Final diagnoses:  Traumatic head injury with multiple lacerations, initial encounter    Good approximation, hemostasis with  dermabond. Neurologically intact with mechanical fall. Given wound care and return precautions.     Leone Brand, MD 01/19/16 [id number]  Leo Grosser, MD 01/19/16 [phone]

## 2016-01-19 NOTE — ED Notes (Signed)
Fell and hit head on corner of wall. Vertical lac to forehead. No LOC. Ems transport from home [facility] assisted living)

## 2016-01-19 NOTE — ED Notes (Signed)
D/c home with family to drive. [place] DNR returned to pt at discharge

## 2016-01-20 DIAGNOSIS — E119 Type 2 diabetes mellitus without complications: Secondary | ICD-10-CM | POA: Diagnosis not present

## 2016-01-20 DIAGNOSIS — N189 Chronic kidney disease, unspecified: Secondary | ICD-10-CM | POA: Diagnosis not present

## 2016-01-20 DIAGNOSIS — C229 Malignant neoplasm of liver, not specified as primary or secondary: Secondary | ICD-10-CM | POA: Diagnosis not present

## 2016-01-20 DIAGNOSIS — R262 Difficulty in walking, not elsewhere classified: Secondary | ICD-10-CM | POA: Diagnosis not present

## 2016-01-24 ENCOUNTER — Other Ambulatory Visit (HOSPITAL_COMMUNITY): Payer: Self-pay | Admitting: General Surgery

## 2016-01-24 DIAGNOSIS — R109 Unspecified abdominal pain: Secondary | ICD-10-CM

## 2016-01-26 ENCOUNTER — Encounter (HOSPITAL_COMMUNITY): Payer: Self-pay

## 2016-01-26 ENCOUNTER — Ambulatory Visit (HOSPITAL_COMMUNITY)
Admission: RE | Admit: 2016-01-26 | Discharge: 2016-01-26 | Disposition: A | Discharge status: Home / Self Care | Payer: Medicare Other | Source: Ambulatory Visit | Attending: General Surgery | Admitting: General Surgery

## 2016-01-26 DIAGNOSIS — K5730 Diverticulosis of large intestine without perforation or abscess without bleeding: Secondary | ICD-10-CM | POA: Diagnosis not present

## 2016-01-26 DIAGNOSIS — I2510 Atherosclerotic heart disease of native coronary artery without angina pectoris: Secondary | ICD-10-CM | POA: Insufficient documentation

## 2016-01-26 DIAGNOSIS — I313 Pericardial effusion (noninflammatory): Secondary | ICD-10-CM | POA: Diagnosis not present

## 2016-01-26 DIAGNOSIS — I517 Cardiomegaly: Secondary | ICD-10-CM | POA: Insufficient documentation

## 2016-01-26 DIAGNOSIS — K632 Fistula of intestine: Secondary | ICD-10-CM | POA: Insufficient documentation

## 2016-01-26 DIAGNOSIS — N200 Calculus of kidney: Secondary | ICD-10-CM | POA: Diagnosis not present

## 2016-01-26 DIAGNOSIS — M4316 Spondylolisthesis, lumbar region: Secondary | ICD-10-CM | POA: Insufficient documentation

## 2016-01-26 DIAGNOSIS — K5732 Diverticulitis of large intestine without perforation or abscess without bleeding: Secondary | ICD-10-CM | POA: Insufficient documentation

## 2016-01-26 DIAGNOSIS — K5641 Fecal impaction: Secondary | ICD-10-CM | POA: Insufficient documentation

## 2016-01-26 DIAGNOSIS — R109 Unspecified abdominal pain: Secondary | ICD-10-CM | POA: Diagnosis not present

## 2016-01-26 DIAGNOSIS — C229 Malignant neoplasm of liver, not specified as primary or secondary: Secondary | ICD-10-CM | POA: Diagnosis not present

## 2016-01-26 LAB — POCT I-STAT CREATININE: Creatinine, Ser: 0.9 mg/dL (ref 0.44–1.00)

## 2016-01-26 MED ORDER — IOPAMIDOL (ISOVUE-300) INJECTION 61%
100.0000 mL | Freq: Once | INTRAVENOUS | Status: AC | PRN
  Administered 2016-01-26: 100 mL via INTRAVENOUS

## 2016-01-27 ENCOUNTER — Other Ambulatory Visit: Payer: Self-pay | Admitting: General Surgery

## 2016-01-30 ENCOUNTER — Other Ambulatory Visit (HOSPITAL_COMMUNITY): Payer: Self-pay | Admitting: General Surgery

## 2016-01-30 DIAGNOSIS — C229 Malignant neoplasm of liver, not specified as primary or secondary: Secondary | ICD-10-CM

## 2016-02-06 ENCOUNTER — Other Ambulatory Visit: Payer: Self-pay | Admitting: Radiology

## 2016-02-07 ENCOUNTER — Other Ambulatory Visit (HOSPITAL_COMMUNITY): Payer: Self-pay | Admitting: General Surgery

## 2016-02-07 ENCOUNTER — Encounter (HOSPITAL_COMMUNITY): Payer: Self-pay

## 2016-02-07 ENCOUNTER — Ambulatory Visit (HOSPITAL_COMMUNITY)
Admission: RE | Admit: 2016-02-07 | Discharge: 2016-02-07 | Disposition: A | Discharge status: Home / Self Care | Payer: Medicare Other | Source: Ambulatory Visit | Attending: General Surgery | Admitting: General Surgery

## 2016-02-07 DIAGNOSIS — C229 Malignant neoplasm of liver, not specified as primary or secondary: Secondary | ICD-10-CM

## 2016-02-07 DIAGNOSIS — T814XXA Infection following a procedure, initial encounter: Secondary | ICD-10-CM | POA: Diagnosis not present

## 2016-02-07 DIAGNOSIS — N9489 Other specified conditions associated with female genital organs and menstrual cycle: Secondary | ICD-10-CM | POA: Diagnosis not present

## 2016-02-07 LAB — CBC WITH DIFFERENTIAL/PLATELET
BASOS ABS: 0.1 10*3/uL (ref 0.0–0.1)
Basophils Relative: 1 %
EOS ABS: 0.2 10*3/uL (ref 0.0–0.7)
EOS PCT: 3 %
HCT: 34.5 % — ABNORMAL LOW (ref 36.0–46.0)
Hemoglobin: 10.7 g/dL — ABNORMAL LOW (ref 12.0–15.0)
Lymphocytes Relative: 21 %
Lymphs Abs: 1.3 10*3/uL (ref 0.7–4.0)
MCH: 24.7 pg — AB (ref 26.0–34.0)
MCHC: 31 g/dL (ref 30.0–36.0)
MCV: 79.7 fL (ref 78.0–100.0)
Monocytes Absolute: 0.3 10*3/uL (ref 0.1–1.0)
Monocytes Relative: 6 %
Neutro Abs: 4.3 10*3/uL (ref 1.7–7.7)
Neutrophils Relative %: 69 %
PLATELETS: 236 10*3/uL (ref 150–400)
RBC: 4.33 MIL/uL (ref 3.87–5.11)
RDW: 16.4 % — AB (ref 11.5–15.5)
WBC: 6.2 10*3/uL (ref 4.0–10.5)

## 2016-02-07 LAB — COMPREHENSIVE METABOLIC PANEL
ALBUMIN: 2.9 g/dL — AB (ref 3.5–5.0)
ALK PHOS: 68 U/L (ref 38–126)
ALT: 8 U/L — AB (ref 14–54)
AST: 15 U/L (ref 15–41)
Anion gap: 7 (ref 5–15)
BILIRUBIN TOTAL: 0.4 mg/dL (ref 0.3–1.2)
BUN: 13 mg/dL (ref 6–20)
CHLORIDE: 101 mmol/L (ref 101–111)
CO2: 28 mmol/L (ref 22–32)
Calcium: 10.1 mg/dL (ref 8.9–10.3)
Creatinine, Ser: 1.13 mg/dL — ABNORMAL HIGH (ref 0.44–1.00)
GFR calc Af Amer: 49 mL/min — ABNORMAL LOW (ref >60)
GFR calc non Af Amer: 42 mL/min — ABNORMAL LOW (ref >60)
Glucose, Bld: 100 mg/dL — ABNORMAL HIGH (ref 65–99)
POTASSIUM: 3.8 mmol/L (ref 3.5–5.1)
SODIUM: 136 mmol/L (ref 135–145)
Total Protein: 7.1 g/dL (ref 6.5–8.1)

## 2016-02-07 LAB — TYPE AND SCREEN
ABO/RH(D): B POS
ANTIBODY SCREEN: NEGATIVE

## 2016-02-07 LAB — PROTIME-INR
INR: 1.18 (ref 0.00–1.49)
Prothrombin Time: 15.1 seconds (ref 11.6–15.2)

## 2016-02-07 LAB — ABO/RH: ABO/RH(D): B POS

## 2016-02-07 MED ORDER — MIDAZOLAM HCL 2 MG/2ML IJ SOLN
INTRAMUSCULAR | Status: AC | PRN
  Administered 2016-02-07: 1 mg via INTRAVENOUS

## 2016-02-07 MED ORDER — SODIUM CHLORIDE 0.9 % IV SOLN
INTRAVENOUS | Status: DC
  Administered 2016-02-07: 11:00:00 via INTRAVENOUS

## 2016-02-07 MED ORDER — FENTANYL CITRATE (PF) 100 MCG/2ML IJ SOLN
INTRAMUSCULAR | Status: AC
  Filled 2016-02-07: qty 2

## 2016-02-07 MED ORDER — LIDOCAINE HCL 1 % IJ SOLN
INTRAMUSCULAR | Status: AC
  Filled 2016-02-07: qty 20

## 2016-02-07 MED ORDER — MIDAZOLAM HCL 2 MG/2ML IJ SOLN
INTRAMUSCULAR | Status: AC
  Filled 2016-02-07: qty 2

## 2016-02-07 MED ORDER — FENTANYL CITRATE (PF) 100 MCG/2ML IJ SOLN
INTRAMUSCULAR | Status: AC | PRN
  Administered 2016-02-07: 50 ug via INTRAVENOUS

## 2016-02-07 NOTE — Procedures (Signed)
CT aspiration L pelvic collection 54m pale yellow thin fluid, sent for labs No complication No blood loss. [facility]

## 2016-02-07 NOTE — Discharge Instructions (Signed)
CALL [phone] IF ANY PROBLEMS,QUESTIONS, OR CONCERNS; CALL IF ANY BLEEDING,DRAINAGE,FEVER,PAIN,SWELLING OR REDNESS AT LEFT LOWER ABDOMEN SITE

## 2016-02-07 NOTE — H&P (Signed)
Chief Complaint: Left lower quadrant fluid collection  Referring Physician(s): Byerly,Faera  Supervising Physician: Sandi Mariscal  Patient Status: Outpatient  History of Present Illness: Destiny Velasquez is a 82 y.o. female Left lower quadrant abdominal pain for 1-2 weeks.  Remote history of breast cancer.   Primary liver cancer status post surgical resection with abscess and drainage.  CT scan shows diffuse prominent abnormal wall thickening in the proximal sigmoid colon. There is some large stool filled diverticula in this vicinity. Surrounding inflammatory stranding. Thick-walled and partially septated 4.7 by 3.8 by 5.0 cm (volume = 46.4 cc)   Fluid collection along the anterior margin of the left psoas muscle, causing some flattening of the psoas muscle, previous measurement 3.5 by 5.2 by 4.8 cm (volume = 45.4 cc).  There is complex fistula extending from the posterior margin of the proximal sigmoid colon. This seems to have 3 main components. One component extends around to the left of the sigmoid colon and tracks around the anterior abdominal wall musculature and may re- connect to the colon.   A second branching component of this fistula extends to the left vaginal cuff. A third branching component of this fistula extends to the dome of the urinary bladder.   Accordingly this complex proximal sigmoid colon fistula has colovesical and colovaginal components. These appear significantly worsened from prior.  She is for surgery by Dr. Barry Dienes on Thursday.  We are asked to perform an image guided drain placement today prior to her surgical procedure (Colectomy)  She is NPO. She does not take blood thinners. She denies fever/chills.  Past Medical History  Diagnosis Date  . Hypertension   . Hyperlipidemia   . Thyroid disease   . Subacute delirium   . Essential and other specified forms of tremor   . Unspecified hypothyroidism   . Diverticulosis of colon (without mention of  hemorrhage)   . Unspecified constipation   . Anal and rectal polyp   . Disorder of bone and cartilage, unspecified   . Pneumonia X 1  . GERD (gastroesophageal reflux disease)   . Diverticulitis of large intestine without perforation or abscess without bleeding   . Malignant neoplasm of breast (female), unspecified site   . Breast cancer, right breast ([place]) 1971    "never had chemo or radiation"  . Liver cancer ([place]) 2016    Past Surgical History  Procedure Laterality Date  . Appendectomy    . Abdominal hysterectomy    . Tonsillectomy and adenoidectomy  1935  . Cholecystectomy    . Breast biopsy Right 1971  . Mastectomy Right 1971  . Tubal ligation    . Cataract extraction w/ intraocular lens  implant, bilateral Bilateral   . Rectal polypectomy  early 2000's  . Laparoscopy N/A 06/14/2015    Procedure: LAPAROSCOPY DIAGNOSTIC;  Surgeon: [name], MD;  Location: [facility];  Service: General;  Laterality: N/A;  . Open partial hepatectomy [83] N/A 06/14/2015    Procedure:  OPEN PARTIAL RIGHT HEPATECTOMY;  Surgeon: [name], MD;  Location: [facility];  Service: General;  Laterality: N/A;  converted to open @ 1447  . Liver biopsy  06/14/2015    Procedure: LIVER BIOPSY;  Surgeon: [name], MD;  Location: [facility];  Service: General;;    Allergies: Levaquin  Medications: Prior to Admission medications   Medication Sig Start Date End Date Taking? Authorizing Provider  acetaminophen (TYLENOL) 325 MG tablet Take 650 mg by mouth every 6 (six) hours as needed (pain).  Yes Historical Provider, MD  amLODipine (NORVASC) 5 MG tablet Take 5 mg by mouth daily.   Yes Historical Provider, MD  amoxicillin-clavulanate (AUGMENTIN) 875-125 MG tablet Take 1 tablet by mouth 2 (two) times daily.   Yes Historical Provider, MD  aspirin 81 MG tablet Take 81 mg by mouth daily.    Yes Historical Provider, MD  buPROPion (WELLBUTRIN XL) 150 MG 24 hr tablet Take 150 mg by mouth daily.   Yes Historical  Provider, MD  feeding supplement (BOOST / RESOURCE BREEZE) LIQD Take 1 Container by mouth 2 (two) times daily between meals.    Yes Historical Provider, MD  HYDROcodone-acetaminophen (NORCO/VICODIN) 5-325 MG tablet Take 1 tablet by mouth every 6 (six) hours as needed for moderate pain.   Yes Historical Provider, MD  levothyroxine (SYNTHROID, LEVOTHROID) 100 MCG tablet Take 100 mcg by mouth daily before breakfast.   Yes Historical Provider, MD  ondansetron (ZOFRAN) 4 MG tablet Take 4 mg by mouth daily as needed for nausea or vomiting.   Yes Historical Provider, MD  pantoprazole (PROTONIX) 40 MG tablet Take 40 mg by mouth 2 (two) times daily before a meal.   Yes Historical Provider, MD  Probiotic Product (PROBIOTIC DAILY PO) Take 1 tablet by mouth daily.    Yes Historical Provider, MD  ranitidine (ZANTAC) 150 MG capsule Take 150 mg by mouth daily as needed (indigestion).   Yes Historical Provider, MD  saccharomyces boulardii (FLORASTOR) 250 MG capsule Take 250 mg by mouth 2 (two) times daily.   Yes Historical Provider, MD  simethicone (MYLICON) 349 MG chewable tablet Chew 125 mg by mouth 3 (three) times daily before meals.   Yes Historical Provider, MD  LORazepam (ATIVAN) 0.5 MG tablet Take 0.5 mg by mouth 2 (two) times daily as needed for anxiety.     Historical Provider, MD  polyethylene glycol (MIRALAX / GLYCOLAX) packet Take 17 g by mouth daily.    Historical Provider, MD     Family History  Problem Relation Age of Onset  . Cancer Mother   . Hypertension Father     Social History   Social History  . Marital Status: Married    Spouse Name: N/A  . Number of Children: N/A  . Years of Education: N/A   Social History Main Topics  . Smoking status: Former Smoker -- 0.50 packs/day for 40 years    Types: Cigarettes    Quit date: 08/13/2012  . Smokeless tobacco: Never Used     Comment: "quit smoking cigarettes years ago; I was probably in my 75's"  . Alcohol Use: No     Comment: once per  month   . Drug Use: No  . Sexual Activity: No   Other Topics Concern  . None   Social History Narrative    Review of Systems: A 12 point ROS discussed  Review of Systems  Constitutional: Positive for activity change and appetite change. Negative for fever and chills.  HENT: Negative.   Respiratory: Negative for cough and shortness of breath.   Cardiovascular: Negative for chest pain.  Gastrointestinal: Positive for abdominal pain. Negative for nausea and vomiting.  Genitourinary: Negative.   Musculoskeletal: Negative.   Skin: Negative.   Neurological: Negative.   Hematological: Negative.   Psychiatric/Behavioral: Negative.     Vital Signs: BP 162/76 mmHg  Pulse 69  Temp(Src) 98.3 F (36.8 C) (Oral)  Resp 16  Ht '5\' 3"'$  (1.6 m)  Wt 123 lb (55.792 kg)  BMI 21.79 kg/m2  SpO2 99%  Physical Exam  Constitutional: She is oriented to person, place, and time. She appears well-developed and well-nourished.  HENT:  Head: Normocephalic and atraumatic.  Eyes: EOM are normal.  Cardiovascular: Normal rate, regular rhythm and normal heart sounds.   No murmur heard. Pulmonary/Chest: Effort normal and breath sounds normal. No respiratory distress. She has no wheezes.  Abdominal: Soft. Bowel sounds are normal. She exhibits no distension. There is no tenderness.  Musculoskeletal: Normal range of motion.  Neurological: She is alert and oriented to person, place, and time.  Skin: Skin is warm and dry.  Psychiatric: She has a normal mood and affect. Her behavior is normal. Judgment and thought content normal.  Vitals reviewed.   Mallampati Score:  MD Evaluation Airway: WNL Heart: WNL Abdomen: WNL Chest/ Lungs: WNL ASA  Classification: 3 Mallampati/Airway Score: Two  Imaging: Ct Abdomen Pelvis W Contrast  01/26/2016  CLINICAL DATA:  Left lower quadrant abdominal pain for 1-2 weeks. Remote history of breast cancer. Primary liver cancer status post surgical resection with abscess  and drainage. EXAM: CT ABDOMEN AND PELVIS WITH CONTRAST TECHNIQUE: Multidetector CT imaging of the abdomen and pelvis was performed using the standard protocol following bolus administration of intravenous contrast. CONTRAST:  180m ISOVUE-300 IOPAMIDOL (ISOVUE-300) INJECTION 61% COMPARISON:  Multiple exams, including 11/23/2015 and 08/17/2015 FINDINGS: Lower chest: Moderate-sized pericardial effusion . Mild cardiomegaly. Circumflex coronary artery atherosclerotic calcification. Faint calcification of the aortic valve leaflets. Cylindrical bronchiectasis in the right lower lobe. Eventration of the right posterior hemidiaphragm containing adipose tissue. Hepatobiliary: Prominent lateral segment left hepatic lobe. Continued architectural distortion along the resected portion of the right hepatic lobe with high stellate hypodensity in various clips or seeds in this region. In general the soft tissue density in this area is significantly reduced, for example comparing image 20 of series 2 to prior 11/23/2015 exam image 31 series 5. There is some residual stranding tracking adjacent to the common bile duct which remains dilated at 1 cm. There is potentially some chronic portal vein thrombosis anteriorly in the right hepatic lobe but adjacent to the postoperative findings. , for example on images 16 through 19 series 2. I do not see an new mass. Pancreas: Unremarkable Spleen: Old granulomatous disease. Adrenals/Urinary Tract: 2 mm right kidney lower pole nonobstructive calculus. 2 mm left kidney upper pole nonobstructive calculus. Thickening of the urinary bladder wall anteriorly and superiorly probably due to adjacent inflammation. Stomach/Bowel: Prominent stool throughout the colon favors constipation. Fairly large amount of stool in the rectal vault. Redundant sigmoid colon. There is diffuse prominent abnormal wall thickening in the proximal sigmoid colon. There is some large stool filled diverticula in this vicinity.  Surrounding inflammatory stranding. Thick-walled and partially septated 4.7 by 3.8 by 5.0 cm (volume = 46.4 cc) Fluid collection along the anterior margin of the left psoas muscle, causing some flattening of the psoas muscle, previous measurement 3.5 by 5.2 by 4.8 cm (volume = 45.4 cc). There is complex fistula extending from the posterior margin of the proximal sigmoid colon. This seems to have 3 main components. One component extends around to the left of the sigmoid colon and tracks around the anterior abdominal wall musculature and may re- connect to the colon. A second branching component of this fistula extends to the left vaginal cuff. A third branching component of this fistula extends to the dome of the urinary bladder. Accordingly this complex proximal sigmoid colon fistula has colovesical and colovaginal components. These appear significantly worsened from prior. Vascular/Lymphatic:  Dense atherosclerotic calcification noted. Scattered small lymph nodes along the retroperitoneum and mesentery are likely reactive. Reproductive: Uterus absent. Colovaginal fistula. Right ovary normal. Left ovary is either not well seen or is somehow associated with the suspected abscess along the left psoas muscle. Other: Trace amount of perihepatic ascites. Trace amount of pelvic ascites. Low-level perirectal and presacral stranding. New Musculoskeletal: 1.2 cm of degenerative grade 2 anterolisthesis at L4-5 with prominent associated impingement. IMPRESSION: 1. Severe thickening of the proximal sigmoid colon over a fairly long segment, with a large stool filled diverticula, surrounding inflammatory stranding, and a complex fistula with components extending to the urinary bladder, left vaginal cuff, and extending around the left side of the sigmoid colon towards the anterior abdominal wall and possibly re- connecting to a more distal portion of the sigmoid colon. 2. Complex fluid collection adjacent to this part of the  sigmoid colon, essentially stable in volume. Likely an abscess. 3. Mild reduction in inflammatory findings along the postoperative site in the right hepatic lobe. 4. Prominent lateral segment left hepatic lobe suggesting cirrhosis. 5. Stable moderate pericardial effusion and mild cardiomegaly with evidence of coronary artery disease. 6. Bilateral nonobstructive nephrolithiasis. 7.  Prominent stool throughout the colon favors constipation. 8. Trace amount of pelvic and perihepatic ascites. 9. 1.2 cm grade 2 anterolisthesis at L4-5 causing prominent impingement. Electronically Signed   By: Van Clines M.D.   On: 01/26/2016 14:28    Labs:  CBC:  Recent Labs  08/01/15 10/27/15 11/03/15 11/30/15 1033  WBC 8.2 8.6 9.0 6.4  HGB 11.0* 10.6* 11.0* 11.7  HCT 34* 34* 34* 36.2  PLT 304 277 287 264    COAGS:  Recent Labs  05/04/15 1223 06/13/15 0830 06/15/15 0510 07/05/15 0415  INR 1.20 1.08  --  1.13  APTT  --   --  28  --     BMP:  Recent Labs  07/07/15 0530 07/08/15 0425 07/09/15 0500 07/18/15 0926  08/01/15 09/12/15 10/27/15 11/30/15 1033 01/26/16 1245  NA 138 136 132* 138  --  137 133* 134* 138  --   K 3.3* 3.7 3.6 3.5  --  3.1* 3.9 4.1 3.3*  --   CL 108 105 101 98  --   --   --   --   --   --   CO2 '26 25 26 25  '$ --   --   --   --  28  --   GLUCOSE 136* 128* 130* 132*  --   --   --   --  122  --   BUN '8 9 9 '$ 6*  --   --  16 21 13.3  --   CALCIUM 8.6* 8.7* 8.6* 9.9  --   --   --   --  10.1  --   CREATININE 0.74 0.88 0.85 0.92  < > 0.9 1.3* 1.1 1.0 0.90  GFRNONAA >60 57* 60* 56*  --   --   --   --   --   --   GFRAA >60 >60 >60 65  --   --   --   --   --   --   < > = values in this interval not displayed.  LIVER FUNCTION TESTS:  Recent Labs  07/02/15 0026 07/07/15 0530 07/18/15 0926 11/30/15 1033  BILITOT 0.6 0.5 0.3 0.31  AST 24 14* 21 14  ALT 17 13* 11 <9  ALKPHOS 84 66 92 66  PROT 6.9 5.1* 6.4  7.5  ALBUMIN 3.0* 2.0* 2.9* 2.7*    TUMOR MARKERS:  Recent  Labs  05/18/15 1019 11/30/15 1033  AFPTM 6.5*  --   CEA <0.5  --   CA199 175.4* 34*    Assessment and Plan:  Left lower quadrant fluid collection   Sigmoid fistula and diverticulitis.  Will proceed with CT guided drain placement today.  This could be techinically difficult to perform and patient is aware.   Risks and Benefits discussed with the patient including bleeding, infection, damage to adjacent structures, bowel perforation/fistula connection, and sepsis.  All of the patient's questions were answered, patient is agreeable to proceed. Consent signed and in chart.  Thank you for this interesting consult.  I greatly enjoyed meeting Destiny Velasquez and look forward to participating in their care.  A copy of this report was sent to the requesting provider on this date.  Electronically Signed: Murrell Redden PA-C 02/07/2016, 10:59 AM   I spent a total of  30 Minutes in face to face in clinical consultation, greater than 50% of which was counseling/coordinating care for CT guided drain placement.

## 2016-02-08 ENCOUNTER — Telehealth: Payer: Self-pay | Admitting: *Deleted

## 2016-02-08 ENCOUNTER — Encounter (HOSPITAL_COMMUNITY): Payer: Self-pay | Admitting: *Deleted

## 2016-02-08 LAB — HEMOGLOBIN A1C
Hgb A1c MFr Bld: 5.9 % — ABNORMAL HIGH (ref 4.8–5.6)
Mean Plasma Glucose: 123 mg/dL

## 2016-02-08 MED ORDER — CEFOTETAN DISODIUM-DEXTROSE 2-2.08 GM-% IV SOLR
2.0000 g | INTRAVENOUS | Status: AC
  Administered 2016-02-09: 2 g via INTRAVENOUS
  Filled 2016-02-08: qty 50

## 2016-02-08 MED ORDER — GABAPENTIN 300 MG PO CAPS
300.0000 mg | ORAL_CAPSULE | ORAL | Status: AC
  Administered 2016-02-09: 300 mg via ORAL
  Filled 2016-02-08: qty 1

## 2016-02-08 MED ORDER — ALVIMOPAN 12 MG PO CAPS
12.0000 mg | ORAL_CAPSULE | ORAL | Status: AC
  Administered 2016-02-09: 12 mg via ORAL
  Filled 2016-02-08: qty 1

## 2016-02-08 MED ORDER — SODIUM CHLORIDE 0.9 % IV SOLN
INTRAVENOUS | Status: DC
  Filled 2016-02-08: qty 6

## 2016-02-08 MED ORDER — DEXTROSE 5 % IV SOLN
2.0000 g | INTRAVENOUS | Status: DC
  Filled 2016-02-08: qty 2

## 2016-02-08 NOTE — Progress Notes (Signed)
Spoke with pt's' daughter, Geanie Kenning who is her health care power of attorney for pt's pre-op call. Pt is a resident at [facility]. Mrs. Ronnald Ramp states the nurses' at [facility] are assisting pt with her bowel prep today. I also called and spoke with Cecile Sheerer, LPN and gave her the medications that pt is able to take in the AM with a small sip of water. Also instructed her that pt is to be NPO tonight after midnight. She voiced understanding. Pt's daughter will be bringing pt to the hospital in the AM. Mrs. Ronnald Ramp denies any cardiac history for her mom, states pt had a stroke after having liver surgery last year. States she does not have any one sided weakness or paralysis.

## 2016-02-08 NOTE — Telephone Encounter (Signed)
Per daughter no longer a patient here.

## 2016-02-08 NOTE — Telephone Encounter (Signed)
Destiny Velasquez, daughter called and stated that patient is no longer our patient. Patient now resides at [facility] and is using their physicians there.

## 2016-02-09 ENCOUNTER — Encounter (HOSPITAL_COMMUNITY)
Admission: RE | Disposition: A | Discharge status: Skilled Nursing Facility | Payer: Self-pay | Source: Ambulatory Visit | Attending: General Surgery

## 2016-02-09 ENCOUNTER — Inpatient Hospital Stay (HOSPITAL_COMMUNITY): Payer: Medicare Other | Admitting: Anesthesiology

## 2016-02-09 ENCOUNTER — Inpatient Hospital Stay (HOSPITAL_COMMUNITY): Payer: Medicare Other

## 2016-02-09 ENCOUNTER — Encounter (HOSPITAL_COMMUNITY): Payer: Self-pay | Admitting: Urology

## 2016-02-09 ENCOUNTER — Inpatient Hospital Stay (HOSPITAL_COMMUNITY)
Admission: RE | Admit: 2016-02-09 | Discharge: 2016-02-14 | DRG: 330 | Disposition: A | Discharge status: Skilled Nursing Facility | Payer: Medicare Other | Source: Ambulatory Visit | Attending: General Surgery | Admitting: General Surgery

## 2016-02-09 DIAGNOSIS — K219 Gastro-esophageal reflux disease without esophagitis: Secondary | ICD-10-CM | POA: Diagnosis present

## 2016-02-09 DIAGNOSIS — E440 Moderate protein-calorie malnutrition: Secondary | ICD-10-CM | POA: Diagnosis present

## 2016-02-09 DIAGNOSIS — N321 Vesicointestinal fistula: Secondary | ICD-10-CM | POA: Diagnosis present

## 2016-02-09 DIAGNOSIS — K5792 Diverticulitis of intestine, part unspecified, without perforation or abscess without bleeding: Secondary | ICD-10-CM | POA: Diagnosis present

## 2016-02-09 DIAGNOSIS — Z6821 Body mass index (BMI) 21.0-21.9, adult: Secondary | ICD-10-CM | POA: Diagnosis not present

## 2016-02-09 DIAGNOSIS — R2681 Unsteadiness on feet: Secondary | ICD-10-CM | POA: Diagnosis not present

## 2016-02-09 DIAGNOSIS — K611 Rectal abscess: Secondary | ICD-10-CM | POA: Diagnosis present

## 2016-02-09 DIAGNOSIS — E785 Hyperlipidemia, unspecified: Secondary | ICD-10-CM | POA: Diagnosis present

## 2016-02-09 DIAGNOSIS — F329 Major depressive disorder, single episode, unspecified: Secondary | ICD-10-CM | POA: Diagnosis present

## 2016-02-09 DIAGNOSIS — Z8505 Personal history of malignant neoplasm of liver: Secondary | ICD-10-CM

## 2016-02-09 DIAGNOSIS — N189 Chronic kidney disease, unspecified: Secondary | ICD-10-CM | POA: Diagnosis present

## 2016-02-09 DIAGNOSIS — K5732 Diverticulitis of large intestine without perforation or abscess without bleeding: Secondary | ICD-10-CM | POA: Diagnosis not present

## 2016-02-09 DIAGNOSIS — K5720 Diverticulitis of large intestine with perforation and abscess without bleeding: Secondary | ICD-10-CM | POA: Diagnosis present

## 2016-02-09 DIAGNOSIS — Z87891 Personal history of nicotine dependence: Secondary | ICD-10-CM | POA: Diagnosis not present

## 2016-02-09 DIAGNOSIS — Z48815 Encounter for surgical aftercare following surgery on the digestive system: Secondary | ICD-10-CM | POA: Diagnosis not present

## 2016-02-09 DIAGNOSIS — Z961 Presence of intraocular lens: Secondary | ICD-10-CM | POA: Diagnosis present

## 2016-02-09 DIAGNOSIS — Z436 Encounter for attention to other artificial openings of urinary tract: Secondary | ICD-10-CM | POA: Diagnosis not present

## 2016-02-09 DIAGNOSIS — K5900 Constipation, unspecified: Secondary | ICD-10-CM | POA: Diagnosis present

## 2016-02-09 DIAGNOSIS — D72829 Elevated white blood cell count, unspecified: Secondary | ICD-10-CM | POA: Diagnosis not present

## 2016-02-09 DIAGNOSIS — Z419 Encounter for procedure for purposes other than remedying health state, unspecified: Secondary | ICD-10-CM

## 2016-02-09 DIAGNOSIS — E039 Hypothyroidism, unspecified: Secondary | ICD-10-CM | POA: Diagnosis present

## 2016-02-09 DIAGNOSIS — R293 Abnormal posture: Secondary | ICD-10-CM | POA: Diagnosis not present

## 2016-02-09 DIAGNOSIS — Z8673 Personal history of transient ischemic attack (TIA), and cerebral infarction without residual deficits: Secondary | ICD-10-CM | POA: Diagnosis not present

## 2016-02-09 DIAGNOSIS — M6281 Muscle weakness (generalized): Secondary | ICD-10-CM | POA: Diagnosis not present

## 2016-02-09 DIAGNOSIS — Z9842 Cataract extraction status, left eye: Secondary | ICD-10-CM | POA: Diagnosis not present

## 2016-02-09 DIAGNOSIS — I129 Hypertensive chronic kidney disease with stage 1 through stage 4 chronic kidney disease, or unspecified chronic kidney disease: Secondary | ICD-10-CM | POA: Diagnosis present

## 2016-02-09 DIAGNOSIS — Z9841 Cataract extraction status, right eye: Secondary | ICD-10-CM | POA: Diagnosis not present

## 2016-02-09 DIAGNOSIS — R1032 Left lower quadrant pain: Secondary | ICD-10-CM | POA: Diagnosis not present

## 2016-02-09 DIAGNOSIS — E1122 Type 2 diabetes mellitus with diabetic chronic kidney disease: Secondary | ICD-10-CM | POA: Diagnosis present

## 2016-02-09 DIAGNOSIS — G8918 Other acute postprocedural pain: Secondary | ICD-10-CM | POA: Diagnosis not present

## 2016-02-09 DIAGNOSIS — N823 Fistula of vagina to large intestine: Secondary | ICD-10-CM | POA: Diagnosis not present

## 2016-02-09 DIAGNOSIS — K5730 Diverticulosis of large intestine without perforation or abscess without bleeding: Secondary | ICD-10-CM | POA: Diagnosis not present

## 2016-02-09 DIAGNOSIS — Z853 Personal history of malignant neoplasm of breast: Secondary | ICD-10-CM

## 2016-02-09 HISTORY — PX: PARTIAL COLECTOMY: SHX5273

## 2016-02-09 HISTORY — DX: Family history of other specified conditions: Z84.89

## 2016-02-09 HISTORY — DX: Major depressive disorder, single episode, unspecified: F32.9

## 2016-02-09 HISTORY — PX: CYSTOSCOPY W/ URETERAL STENT PLACEMENT: SHX1429

## 2016-02-09 HISTORY — DX: Cerebral infarction, unspecified: I63.9

## 2016-02-09 HISTORY — DX: Urinary tract infection, site not specified: N39.0

## 2016-02-09 HISTORY — DX: Depression, unspecified: F32.A

## 2016-02-09 LAB — CBC
HCT: 35.7 % — ABNORMAL LOW (ref 36.0–46.0)
Hemoglobin: 11.2 g/dL — ABNORMAL LOW (ref 12.0–15.0)
MCH: 25.8 pg — ABNORMAL LOW (ref 26.0–34.0)
MCHC: 31.4 g/dL (ref 30.0–36.0)
MCV: 82.3 fL (ref 78.0–100.0)
PLATELETS: 224 10*3/uL (ref 150–400)
RBC: 4.34 MIL/uL (ref 3.87–5.11)
RDW: 16.6 % — AB (ref 11.5–15.5)
WBC: 16.7 10*3/uL — AB (ref 4.0–10.5)

## 2016-02-09 LAB — CREATININE, SERUM
Creatinine, Ser: 1.38 mg/dL — ABNORMAL HIGH (ref 0.44–1.00)
GFR calc non Af Amer: 33 mL/min — ABNORMAL LOW (ref >60)
GFR, EST AFRICAN AMERICAN: 39 mL/min — AB (ref >60)

## 2016-02-09 SURGERY — COLECTOMY, PARTIAL
Anesthesia: Epidural | Site: Groin

## 2016-02-09 MED ORDER — LIDOCAINE HCL 2 % EX GEL
CUTANEOUS | Status: DC | PRN
  Administered 2016-02-09: 1

## 2016-02-09 MED ORDER — STERILE WATER FOR IRRIGATION IR SOLN
Status: DC | PRN
  Administered 2016-02-09: 1000 mL

## 2016-02-09 MED ORDER — BISACODYL 5 MG PO TBEC: 20.0000 mg | DELAYED_RELEASE_TABLET | Freq: Once | ORAL | Status: DC

## 2016-02-09 MED ORDER — KCL IN DEXTROSE-NACL 20-5-0.45 MEQ/L-%-% IV SOLN
INTRAVENOUS | Status: DC
  Administered 2016-02-09 – 2016-02-13 (×5): via INTRAVENOUS
  Filled 2016-02-09 (×8): qty 1000

## 2016-02-09 MED ORDER — HYDROCODONE-ACETAMINOPHEN 5-325 MG PO TABS
1.0000 | ORAL_TABLET | Freq: Four times a day (QID) | ORAL | Status: DC | PRN
  Administered 2016-02-10 – 2016-02-11 (×3): 1 via ORAL
  Filled 2016-02-09 (×3): qty 1

## 2016-02-09 MED ORDER — IOPAMIDOL (ISOVUE-300) INJECTION 61%
INTRAVENOUS | Status: AC
  Filled 2016-02-09: qty 50

## 2016-02-09 MED ORDER — SUGAMMADEX SODIUM 200 MG/2ML IV SOLN
INTRAVENOUS | Status: DC | PRN
  Administered 2016-02-09: 100 mg via INTRAVENOUS

## 2016-02-09 MED ORDER — ROPIVACAINE HCL 2 MG/ML IJ SOLN
INTRAMUSCULAR | Status: DC | PRN
  Administered 2016-02-09: 6 mL/h via EPIDURAL

## 2016-02-09 MED ORDER — HYDROMORPHONE HCL 1 MG/ML IJ SOLN: 0.2500 mg | INTRAMUSCULAR | Status: DC | PRN

## 2016-02-09 MED ORDER — DEXAMETHASONE SODIUM PHOSPHATE 10 MG/ML IJ SOLN
INTRAMUSCULAR | Status: AC
  Filled 2016-02-09: qty 1

## 2016-02-09 MED ORDER — FENTANYL CITRATE (PF) 100 MCG/2ML IJ SOLN
50.0000 ug | Freq: Once | INTRAMUSCULAR | Status: AC
  Administered 2016-02-09: 50 ug via INTRAVENOUS

## 2016-02-09 MED ORDER — PHENYLEPHRINE 40 MCG/ML (10ML) SYRINGE FOR IV PUSH (FOR BLOOD PRESSURE SUPPORT)
PREFILLED_SYRINGE | INTRAVENOUS | Status: AC
  Filled 2016-02-09: qty 10

## 2016-02-09 MED ORDER — SIMETHICONE 80 MG PO CHEW: 160.0000 mg | CHEWABLE_TABLET | Freq: Four times a day (QID) | ORAL | Status: DC | PRN

## 2016-02-09 MED ORDER — NEOMYCIN SULFATE 500 MG PO TABS: 1000.0000 mg | ORAL_TABLET | ORAL | Status: DC

## 2016-02-09 MED ORDER — MEPERIDINE HCL 25 MG/ML IJ SOLN: 6.2500 mg | INTRAMUSCULAR | Status: DC | PRN

## 2016-02-09 MED ORDER — ALVIMOPAN 12 MG PO CAPS
12.0000 mg | ORAL_CAPSULE | Freq: Two times a day (BID) | ORAL | Status: DC
  Administered 2016-02-10: 12 mg via ORAL
  Filled 2016-02-09: qty 1

## 2016-02-09 MED ORDER — ROCURONIUM BROMIDE 100 MG/10ML IV SOLN
INTRAVENOUS | Status: DC | PRN
  Administered 2016-02-09: 50 mg via INTRAVENOUS

## 2016-02-09 MED ORDER — ACETAMINOPHEN 325 MG PO TABS
650.0000 mg | ORAL_TABLET | Freq: Four times a day (QID) | ORAL | Status: DC | PRN
  Administered 2016-02-11: 650 mg via ORAL
  Filled 2016-02-09: qty 2

## 2016-02-09 MED ORDER — BOOST / RESOURCE BREEZE PO LIQD
1.0000 | Freq: Two times a day (BID) | ORAL | Status: DC
  Administered 2016-02-10 – 2016-02-13 (×5): 1 via ORAL

## 2016-02-09 MED ORDER — LORAZEPAM 0.5 MG PO TABS
0.5000 mg | ORAL_TABLET | Freq: Two times a day (BID) | ORAL | Status: DC | PRN
Start: 2016-02-09 — End: 2016-02-14
  Administered 2016-02-10 – 2016-02-12 (×2): 0.5 mg via ORAL
  Filled 2016-02-09 (×2): qty 1

## 2016-02-09 MED ORDER — IOPAMIDOL (ISOVUE-300) INJECTION 61%
INTRAVENOUS | Status: DC | PRN
  Administered 2016-02-09: 50 mL via URETHRAL

## 2016-02-09 MED ORDER — ONDANSETRON HCL 4 MG/2ML IJ SOLN
4.0000 mg | Freq: Once | INTRAMUSCULAR | Status: AC
  Administered 2016-02-09: 4 mg via INTRAVENOUS

## 2016-02-09 MED ORDER — ONDANSETRON HCL 4 MG/2ML IJ SOLN
INTRAMUSCULAR | Status: AC
  Filled 2016-02-09: qty 2

## 2016-02-09 MED ORDER — SACCHAROMYCES BOULARDII 250 MG PO CAPS
250.0000 mg | ORAL_CAPSULE | Freq: Two times a day (BID) | ORAL | Status: DC
  Administered 2016-02-09 – 2016-02-14 (×10): 250 mg via ORAL
  Filled 2016-02-09 (×10): qty 1

## 2016-02-09 MED ORDER — PHENYLEPHRINE HCL 10 MG/ML IJ SOLN
10.0000 mg | INTRAMUSCULAR | Status: DC | PRN
  Administered 2016-02-09 (×2): 50 ug/min via INTRAVENOUS

## 2016-02-09 MED ORDER — 0.9 % SODIUM CHLORIDE (POUR BTL) OPTIME
TOPICAL | Status: DC | PRN
  Administered 2016-02-09 (×2): 1000 mL

## 2016-02-09 MED ORDER — BUPROPION HCL ER (XL) 150 MG PO TB24
150.0000 mg | ORAL_TABLET | Freq: Every day | ORAL | Status: DC
  Administered 2016-02-10 – 2016-02-12 (×3): 150 mg via ORAL
  Filled 2016-02-09 (×4): qty 1

## 2016-02-09 MED ORDER — HEPARIN SODIUM (PORCINE) 5000 UNIT/ML IJ SOLN: 5000.0000 [IU] | Freq: Three times a day (TID) | INTRAMUSCULAR | Status: DC

## 2016-02-09 MED ORDER — FENTANYL CITRATE (PF) 100 MCG/2ML IJ SOLN
INTRAMUSCULAR | Status: AC
  Administered 2016-02-09: 50 ug via INTRAVENOUS
  Filled 2016-02-09: qty 2

## 2016-02-09 MED ORDER — DIPHENHYDRAMINE HCL 12.5 MG/5ML PO ELIX: 12.5000 mg | ORAL_SOLUTION | Freq: Four times a day (QID) | ORAL | Status: DC | PRN

## 2016-02-09 MED ORDER — ROCURONIUM BROMIDE 50 MG/5ML IV SOLN
INTRAVENOUS | Status: AC
  Filled 2016-02-09: qty 1

## 2016-02-09 MED ORDER — ONDANSETRON HCL 4 MG PO TABS: 4.0000 mg | ORAL_TABLET | Freq: Four times a day (QID) | ORAL | Status: DC | PRN

## 2016-02-09 MED ORDER — CHLORHEXIDINE GLUCONATE 4 % EX LIQD: 60.0000 mL | Freq: Once | CUTANEOUS | Status: DC

## 2016-02-09 MED ORDER — METRONIDAZOLE 500 MG PO TABS: 1000.0000 mg | ORAL_TABLET | ORAL | Status: DC

## 2016-02-09 MED ORDER — ALUM & MAG HYDROXIDE-SIMETH 200-200-20 MG/5ML PO SUSP: 30.0000 mL | Freq: Four times a day (QID) | ORAL | Status: DC | PRN

## 2016-02-09 MED ORDER — SUGAMMADEX SODIUM 200 MG/2ML IV SOLN
INTRAVENOUS | Status: AC
  Filled 2016-02-09: qty 2

## 2016-02-09 MED ORDER — POLYETHYLENE GLYCOL 3350 17 GM/SCOOP PO POWD: 1.0000 | Freq: Once | ORAL | Status: DC

## 2016-02-09 MED ORDER — LACTATED RINGERS IV SOLN
Freq: Once | INTRAVENOUS | Status: AC
  Administered 2016-02-09: 16:00:00 via INTRAVENOUS

## 2016-02-09 MED ORDER — AMLODIPINE BESYLATE 5 MG PO TABS
5.0000 mg | ORAL_TABLET | Freq: Every day | ORAL | Status: DC
  Administered 2016-02-11 – 2016-02-14 (×4): 5 mg via ORAL
  Filled 2016-02-09 (×5): qty 1

## 2016-02-09 MED ORDER — ACETAMINOPHEN 500 MG PO TABS
1000.0000 mg | ORAL_TABLET | ORAL | Status: AC
  Administered 2016-02-09: 1000 mg via ORAL
  Filled 2016-02-09: qty 2

## 2016-02-09 MED ORDER — DIPHENHYDRAMINE HCL 50 MG/ML IJ SOLN
12.5000 mg | Freq: Four times a day (QID) | INTRAMUSCULAR | Status: DC | PRN
  Administered 2016-02-14: 12.5 mg via INTRAVENOUS
  Filled 2016-02-09: qty 1

## 2016-02-09 MED ORDER — ONDANSETRON HCL 4 MG PO TABS: 4.0000 mg | ORAL_TABLET | Freq: Every day | ORAL | Status: DC | PRN

## 2016-02-09 MED ORDER — MIDAZOLAM HCL 2 MG/2ML IJ SOLN
INTRAMUSCULAR | Status: AC
  Administered 2016-02-09: 1 mg via INTRAVENOUS
  Filled 2016-02-09: qty 2

## 2016-02-09 MED ORDER — FENTANYL CITRATE (PF) 100 MCG/2ML IJ SOLN
12.5000 ug | INTRAMUSCULAR | Status: DC | PRN
  Administered 2016-02-11: 25 ug via INTRAVENOUS
  Filled 2016-02-09: qty 2

## 2016-02-09 MED ORDER — ONDANSETRON HCL 4 MG/2ML IJ SOLN
4.0000 mg | Freq: Four times a day (QID) | INTRAMUSCULAR | Status: DC | PRN
  Administered 2016-02-09: 4 mg via INTRAVENOUS
  Filled 2016-02-09: qty 2

## 2016-02-09 MED ORDER — ONDANSETRON HCL 4 MG/2ML IJ SOLN
INTRAMUSCULAR | Status: DC | PRN
  Administered 2016-02-09: 4 mg via INTRAVENOUS

## 2016-02-09 MED ORDER — PANTOPRAZOLE SODIUM 40 MG PO TBEC
40.0000 mg | DELAYED_RELEASE_TABLET | Freq: Every day | ORAL | Status: DC
  Administered 2016-02-10 – 2016-02-14 (×5): 40 mg via ORAL
  Filled 2016-02-09 (×5): qty 1

## 2016-02-09 MED ORDER — DEXAMETHASONE SODIUM PHOSPHATE 10 MG/ML IJ SOLN
INTRAMUSCULAR | Status: DC | PRN
Start: 2016-02-09 — End: 2016-02-09
  Administered 2016-02-09: 5 mg via INTRAVENOUS

## 2016-02-09 MED ORDER — GENTAMICIN SULFATE 40 MG/ML IJ SOLN
INTRAMUSCULAR | Status: AC
  Administered 2016-02-09: 14:00:00 via INTRAPERITONEAL
  Filled 2016-02-09: qty 6

## 2016-02-09 MED ORDER — LIDOCAINE 2% (20 MG/ML) 5 ML SYRINGE
INTRAMUSCULAR | Status: AC
  Filled 2016-02-09: qty 5

## 2016-02-09 MED ORDER — LEVOTHYROXINE SODIUM 100 MCG PO TABS
100.0000 ug | ORAL_TABLET | Freq: Every day | ORAL | Status: DC
  Administered 2016-02-10 – 2016-02-14 (×5): 100 ug via ORAL
  Filled 2016-02-09 (×5): qty 1

## 2016-02-09 MED ORDER — DEXTROSE 5 % IV SOLN
2.0000 g | Freq: Two times a day (BID) | INTRAVENOUS | Status: AC
  Administered 2016-02-10 (×2): 2 g via INTRAVENOUS
  Filled 2016-02-09 (×2): qty 2

## 2016-02-09 MED ORDER — FENTANYL CITRATE (PF) 250 MCG/5ML IJ SOLN
INTRAMUSCULAR | Status: AC
  Filled 2016-02-09: qty 5

## 2016-02-09 MED ORDER — PROPOFOL 10 MG/ML IV BOLUS
INTRAVENOUS | Status: DC | PRN
  Administered 2016-02-09: 100 mg via INTRAVENOUS

## 2016-02-09 MED ORDER — FENTANYL CITRATE (PF) 100 MCG/2ML IJ SOLN
INTRAMUSCULAR | Status: DC | PRN
  Administered 2016-02-09: 50 ug via INTRAVENOUS

## 2016-02-09 MED ORDER — ROPIVACAINE HCL 2 MG/ML IJ SOLN
6.0000 mL/h | INTRAMUSCULAR | Status: DC
  Administered 2016-02-09: 5 mL/h via EPIDURAL
  Administered 2016-02-11 – 2016-02-12 (×2): 6 mL/h via EPIDURAL
  Filled 2016-02-09 (×3): qty 200

## 2016-02-09 MED ORDER — PROMETHAZINE HCL 25 MG/ML IJ SOLN: 6.2500 mg | INTRAMUSCULAR | Status: DC | PRN

## 2016-02-09 MED ORDER — PROPOFOL 10 MG/ML IV BOLUS
INTRAVENOUS | Status: AC
  Filled 2016-02-09: qty 20

## 2016-02-09 MED ORDER — LIDOCAINE HCL (CARDIAC) 20 MG/ML IV SOLN
INTRAVENOUS | Status: DC | PRN
  Administered 2016-02-09: 60 mg via INTRAVENOUS

## 2016-02-09 MED ORDER — LACTATED RINGERS IV SOLN
INTRAVENOUS | Status: DC
  Administered 2016-02-09 (×3): via INTRAVENOUS

## 2016-02-09 MED ORDER — SODIUM CHLORIDE 0.9 % IR SOLN
Status: DC | PRN
  Administered 2016-02-09: 3000 mL

## 2016-02-09 MED ORDER — MIDAZOLAM HCL 2 MG/2ML IJ SOLN
1.0000 mg | Freq: Once | INTRAMUSCULAR | Status: AC
  Administered 2016-02-09: 1 mg via INTRAVENOUS

## 2016-02-09 SURGICAL SUPPLY — 99 items
ADAPTER CATH URET PLST 4-6FR (CATHETERS) IMPLANT
ADAPTER GOLDBERG URETERAL (ADAPTER) ×4 IMPLANT
ADPR CATH 15X14FR FL DRN BG (ADAPTER) ×2
ADPR CATH URET STRL DISP 4-6FR (CATHETERS)
APL SKNCLS STERI-STRIP NONHPOA (GAUZE/BANDAGES/DRESSINGS)
BAG URINE DRAINAGE (UROLOGICAL SUPPLIES) ×4 IMPLANT
BAG URO CATCHER STRL LF (MISCELLANEOUS) ×4 IMPLANT
BENZOIN TINCTURE PRP APPL 2/3 (GAUZE/BANDAGES/DRESSINGS) IMPLANT
BLADE 10 SAFETY STRL DISP (BLADE) ×4 IMPLANT
BLADE SURG ROTATE 9660 (MISCELLANEOUS) IMPLANT
BUCKET BIOHAZARD WASTE 5 GAL (MISCELLANEOUS) ×4 IMPLANT
CANISTER SUCTION 2500CC (MISCELLANEOUS) ×4 IMPLANT
CATH FOLEY 2WAY SLVR  5CC 16FR (CATHETERS) ×2
CATH FOLEY 2WAY SLVR 5CC 16FR (CATHETERS) ×2 IMPLANT
CATH INTERMIT  6FR 70CM (CATHETERS) IMPLANT
CATH URET 5FR 28IN CONE TIP (BALLOONS) ×2
CATH URET 5FR 70CM CONE TIP (BALLOONS) ×2 IMPLANT
CHLORAPREP W/TINT 26ML (MISCELLANEOUS) ×4 IMPLANT
COVER MAYO STAND STRL (DRAPES) ×8 IMPLANT
COVER SURGICAL LIGHT HANDLE (MISCELLANEOUS) ×8 IMPLANT
DRAPE CAMERA CLOSED 9X96 (DRAPES) ×4 IMPLANT
DRAPE LAPAROSCOPIC ABDOMINAL (DRAPES) ×4 IMPLANT
DRAPE PROXIMA HALF (DRAPES) ×8 IMPLANT
DRAPE UTILITY XL STRL (DRAPES) ×20 IMPLANT
DRAPE WARM FLUID 44X44 (DRAPE) ×4 IMPLANT
DRSG OPSITE POSTOP 4X10 (GAUZE/BANDAGES/DRESSINGS) ×4 IMPLANT
DRSG OPSITE POSTOP 4X8 (GAUZE/BANDAGES/DRESSINGS) IMPLANT
ELECT BLADE 6.5 EXT (BLADE) ×4 IMPLANT
ELECT CAUTERY BLADE 6.4 (BLADE) ×8 IMPLANT
ELECT REM PT RETURN 9FT ADLT (ELECTROSURGICAL) ×4
ELECTRODE REM PT RTRN 9FT ADLT (ELECTROSURGICAL) ×2 IMPLANT
GAUZE SPONGE 4X4 16PLY XRAY LF (GAUZE/BANDAGES/DRESSINGS) ×4 IMPLANT
GLOVE BIO SURGEON STRL SZ 6 (GLOVE) ×8 IMPLANT
GLOVE BIO SURGEON STRL SZ 6.5 (GLOVE) ×6 IMPLANT
GLOVE BIO SURGEON STRL SZ7 (GLOVE) ×4 IMPLANT
GLOVE BIO SURGEONS STRL SZ 6.5 (GLOVE) ×2
GLOVE BIOGEL PI IND STRL 6.5 (GLOVE) ×6 IMPLANT
GLOVE BIOGEL PI IND STRL 7.0 (GLOVE) ×4 IMPLANT
GLOVE BIOGEL PI IND STRL 7.5 (GLOVE) ×4 IMPLANT
GLOVE BIOGEL PI INDICATOR 6.5 (GLOVE) ×6
GLOVE BIOGEL PI INDICATOR 7.0 (GLOVE) ×4
GLOVE BIOGEL PI INDICATOR 7.5 (GLOVE) ×4
GLOVE ECLIPSE 7.5 STRL STRAW (GLOVE) ×8 IMPLANT
GLOVE INDICATOR 6.5 STRL GRN (GLOVE) ×8 IMPLANT
GOWN STRL REUS W/ TWL LRG LVL3 (GOWN DISPOSABLE) ×10 IMPLANT
GOWN STRL REUS W/ TWL XL LVL3 (GOWN DISPOSABLE) IMPLANT
GOWN STRL REUS W/TWL 2XL LVL3 (GOWN DISPOSABLE) ×8 IMPLANT
GOWN STRL REUS W/TWL LRG LVL3 (GOWN DISPOSABLE) ×20
GOWN STRL REUS W/TWL XL LVL3 (GOWN DISPOSABLE)
GUIDEWIRE ANG ZIPWIRE 038X150 (WIRE) IMPLANT
GUIDEWIRE COOK  .035 (WIRE) IMPLANT
GUIDEWIRE STR DUAL SENSOR (WIRE) ×4 IMPLANT
KIT BASIN OR (CUSTOM PROCEDURE TRAY) ×4 IMPLANT
KIT ROOM TURNOVER OR (KITS) ×8 IMPLANT
LEGGING LITHOTOMY PAIR STRL (DRAPES) ×4 IMPLANT
MANIFOLD NEPTUNE II (INSTRUMENTS) IMPLANT
NS IRRIG 1000ML POUR BTL (IV SOLUTION) ×12 IMPLANT
PACK CYSTOSCOPY (CUSTOM PROCEDURE TRAY) ×4 IMPLANT
PACK GENERAL/GYN (CUSTOM PROCEDURE TRAY) ×4 IMPLANT
PAD ARMBOARD 7.5X6 YLW CONV (MISCELLANEOUS) ×8 IMPLANT
PENCIL BUTTON HOLSTER BLD 10FT (ELECTRODE) ×4 IMPLANT
PLUG CATH AND CAP STER (CATHETERS) IMPLANT
RELOAD PROXIMATE 75MM BLUE (ENDOMECHANICALS) ×8 IMPLANT
SEALER TISSUE X1 CVD JAW (INSTRUMENTS) ×4 IMPLANT
SPECIMEN JAR LARGE (MISCELLANEOUS) ×4 IMPLANT
SPONGE LAP 18X18 X RAY DECT (DISPOSABLE) IMPLANT
STAPLER GUN LINEAR PROX 60 (STAPLE) ×4 IMPLANT
STAPLER PROXIMATE 75MM BLUE (STAPLE) ×4 IMPLANT
STAPLER VISISTAT 35W (STAPLE) ×4 IMPLANT
STENT POLARIS 5FRX24 (STENTS) IMPLANT
STENT POLARIS 5FRX26 (STENTS) IMPLANT
STENT POLARIS 5FRX28 (STENTS) IMPLANT
STENT URET 6FRX24 CONTOUR (STENTS) IMPLANT
SUCTION POOLE TIP (SUCTIONS) ×4 IMPLANT
SURGILUBE 2OZ TUBE FLIPTOP (MISCELLANEOUS) IMPLANT
SUT PDS AB 1 TP1 96 (SUTURE) ×8 IMPLANT
SUT PROLENE 0 SH 30 (SUTURE) ×4 IMPLANT
SUT PROLENE 2 0 CT2 30 (SUTURE) IMPLANT
SUT PROLENE 2 0 KS (SUTURE) IMPLANT
SUT SILK 2 0 TIES 10X30 (SUTURE) ×4 IMPLANT
SUT VIC AB 2-0 SH 18 (SUTURE) ×4 IMPLANT
SUT VIC AB 3-0 SH 18 (SUTURE) ×4 IMPLANT
SUT VIC AB 3-0 SH 27 (SUTURE)
SUT VIC AB 3-0 SH 27X BRD (SUTURE) IMPLANT
SUT VIC AB 3-0 SH 8-18 (SUTURE) IMPLANT
SUT VICRYL AB 2 0 TIES (SUTURE) ×4 IMPLANT
SUT VICRYL AB 3 0 TIES (SUTURE) ×4 IMPLANT
SYR BULB IRRIGATION 50ML (SYRINGE) ×4 IMPLANT
SYR CONTROL 10ML LL (SYRINGE) ×4 IMPLANT
SYRINGE TOOMEY DISP (SYRINGE) IMPLANT
TOWEL OR 17X26 10 PK STRL BLUE (TOWEL DISPOSABLE) ×8 IMPLANT
TRAY FOLEY CATH 14FRSI W/METER (CATHETERS) IMPLANT
TRAY PROCTOSCOPIC FIBER OPTIC (SET/KITS/TRAYS/PACK) ×4 IMPLANT
TUBE CONNECTING 12'X1/4 (SUCTIONS) ×1
TUBE CONNECTING 12X1/4 (SUCTIONS) ×3 IMPLANT
UNDERPAD 30X30 INCONTINENT (UNDERPADS AND DIAPERS) ×8 IMPLANT
WATER STERILE IRR 1000ML POUR (IV SOLUTION) ×4 IMPLANT
WIRE COONS/BENSON .038X145CM (WIRE) IMPLANT
YANKAUER SUCT BULB TIP NO VENT (SUCTIONS) ×4 IMPLANT

## 2016-02-09 NOTE — Progress Notes (Signed)
Received from PACU to room 6n18, sleepy but easily arousable. Family at bedside

## 2016-02-09 NOTE — H&P (Signed)
Destiny Velasquez 01/13/2016 10:35 AM Location: [facility] Patient #: [id number] DOB: [birth year] Married / Language: Destiny Velasquez / Race: White Female   History of Present Illness Destiny Klein MD; 02/06/2016 8:43 PM) Patient words: f/u sigmoid diverticulitis.  The patient is a 82 year old female who presents with diverticulitis. PRIOR HISTORY [The patient is an 82 year old female status post partial right hepatectomy in November 2016. Her postoperative course was complicated by biloma. She has all of her drains out. Unfortunately, she started developing left lower quadrant pain and underwent a scan demonstrating diverticulitis in January. This never got followed up. She did take a course of antibiotics back in January but developed recurrent symptoms and early April/late March. We restarted her antibiotics and got a repeat CT scan. Repeat CT scan was concerning for persistent masslike thickening of the sigmoid colon. There also were persistent inflammatory changes adjacent to the colon with the potential sinus tract between the sigmoid colon and the left ovary. A barium enema was attempted but was not satisfactory because of the difficulty in holding the contrast in the rectum. The patient is currently feeling relatively well. She does still feel constipated, but is not having any nausea, vomiting, or bloating. She is off antibiotics. She denies fevers and chills. She has been taking occasional MiraLAX for constipation. She does still feel like she has difficulty getting all of her stool evacuated. Of note, Dr. Cristina Velasquez did a colonoscopy on the patient in the fall prior to her liver surgery. She had no evidence of cancer in her sigmoid colon.]   Patient continues to be miserable. She has LLQ pain and stool variability. She has denied vaginal drainage. We got another CT that was suspicious for possible fistula to the ovary. She does not have fever/chills. She denies  nausea/vomiting. However, her appetite and energy level continues to be down.   CT IMPRESSION: 1. Status post removal of percutaneous drainage catheter from the previously noted right hepatic lobe but biloma. There is a mass-like area of architectural distortion in the right upper lobe extending into the adjacent. Height soft tissues. Whether or not this represents postoperative/post inflammatory scarring or residual tumor is uncertain on today's examination. Continued attention on followup studies is recommended. No residual biloma is noted. 2. Persistent mass-like thickening of the sigmoid colon remains concerning for colonic neoplasm. Conceivably, this could simply be related to chronic inflammation in the setting of extensive diverticular disease. However, correlation with colonoscopy is recommended if not recently performed. 3. Notably, there appears to be persistent inflammatory changes adjacent to the proximal sigmoid colon, with what appears to be developing sinus tract medial to the proximal sigmoid colon which appears to extend towards and potentially communicate with the left ovary. The previously noted complex cystic structures in the left adnexal region are again noted and appear slightly larger than prior examinations. This could represent a fistulous connection between the colon and the left ovary, with chronic ovarian abscess (which may intermittently decompress into the colon). 4. Additional findings, as above, similar prior studies.    Allergies Destiny Velasquez, CMA; 01/13/2016 10:37 AM) No Known Drug Allergies10/14/2016  Medication History Destiny Velasquez, CMA; 01/13/2016 10:37 AM) Zofran ('8MG'$  Tablet, Oral prn) Active. Aspirin ('81MG'$  Tablet, Oral daily) Active. Losartan Potassium ('50MG'$  Tablet, Oral) Active. Synthroid (100MCG Tablet, Oral) Active. Ativan (0.'5MG'$  Tablet, Oral) Active. Probiotic Product (Oral) Active. Premarin (0.'3MG'$  Tablet, Oral every other  day) Active. Cozaar ('50MG'$  Tablet, Oral) Active. Simvastatin ('40MG'$  Tablet, Oral daily) Active. Spironolactone-HCTZ (25-'25MG'$  Tablet, Oral)  Active. Vitamin D (2000UNIT Tablet, Oral) Active. Beta Carotene ('15MG'$  Capsule, Oral) Active. Niacin ('500MG'$  Tablet, Oral) Active. Ensure (Oral) Active. Remeron ('15MG'$  Tablet, Oral qhs) Active. Mirtazapine ('15MG'$  Tablet, Oral 1/2 tablet qhs) Active. Pantoprazole Sodium ('40MG'$  Tablet DR, Oral daily) Active. Simethicone ('125MG'$  Tablet Chewable, Oral tid) Active. Medications Reconciled    Review of Systems Destiny Klein MD; 02/06/2016 8:43 PM) All other systems negative  Vitals Destiny Velasquez CMA; 01/13/2016 10:37 AM) 01/13/2016 10:37 AM Weight: 126 lb Height: 63in Body Surface Area: 1.59 m Body Mass Index: 22.32 kg/m  Temp.: 98.25F(Oral)  Pulse: 62 (Regular)  BP: 118/60 (Sitting, Left Arm, Standard)       Physical Exam Destiny Klein MD; 02/06/2016 8:44 PM) General Mental Status-Alert. General Appearance-Consistent with stated age. Hydration-Well hydrated. Voice-Normal.  Head and Neck Head-normocephalic, atraumatic with no lesions or palpable masses.  Eye Sclera/Conjunctiva - Bilateral-No scleral icterus.  Chest and Lung Exam Chest and lung exam reveals -quiet, even and easy respiratory effort with no use of accessory muscles and on auscultation, normal breath sounds, no adventitious sounds and normal vocal resonance. Inspection Chest Wall - Normal. Back - normal.  Breast - Did not examine.  Cardiovascular Cardiovascular examination reveals -normal pedal pulses bilaterally. Note: regular rate and rhythm  Abdomen Inspection-Inspection Normal. Palpation/Percussion Palpation and Percussion of the abdomen reveal - Soft, Non Tender, No Rebound tenderness, No Rigidity (guarding) and No hepatosplenomegaly. Note: soft, non distended, larger mass like area in the LLQ. Tender in the LLQ.   Peripheral  Vascular Upper Extremity Inspection - Bilateral - Normal - No Clubbing, No Cyanosis, No Edema, Pulses Intact. Lower Extremity Palpation - Edema - Bilateral - No edema.  Neurologic Neurologic evaluation reveals -alert and oriented x 3 with no impairment of recent or remote memory. Mental Status-Normal.  Musculoskeletal Global Assessment -Note: no gross deformities.  Normal Exam - Left-Upper Extremity Strength Normal and Lower Extremity Strength Normal. Normal Exam - Right-Upper Extremity Strength Normal and Lower Extremity Strength Normal.  Lymphatic Head & Neck  General Head & Neck Lymphatics: Bilateral - Description - Normal. Axillary  General Axillary Region: Bilateral - Description - Normal. Tenderness - Non Tender.    Assessment & Plan Destiny Klein MD; 02/06/2016 8:53 PM) SIGMOID DIVERTICULITIS (K57.32) Impression: The patient is now not doing well. I think we are at the point where resection of this area of her colon is less risky than leaving it. She may or may not have an ostomy. It would depend intraoperatively on how inflamed this area was. I may need urology involved.  I discussed the surgery including diagrams of anatomy, plan for the hospitalization, and risks of surgery. Risks include bleeding, infection, damage to adjacent structures, ostomy, heart or lung problems, abscess, possible need for additional procedures or surgeries, blood clot, and more.  40 min spent in evaluation, examination, counseling, and coordination of care. >50% spent in counseling. Current Plans Started Augmentin 875-'125MG'$ , 1 (one) Tablet two times daily, #42, 01/13/2016, No Refill. Started Florastor '250MG'$ , 1 (one) Capsule bid, #42, 01/13/2016, No Refill. CT ABDOMEN AND PELVIS W CONTRAST [phone]) (abdominal pain, history of colon stricture/diverticulitis) Follow up with [place] in the office in 1 month.  Call us sooner as needed.    Signed by Destiny Klein, MD (02/06/2016 8:54 PM)

## 2016-02-09 NOTE — Transfer of Care (Signed)
Immediate Anesthesia Transfer of Care Note  Patient: Destiny Velasquez  Procedure(s) Performed: Procedure(s): OPEN SIGMOID  COLECTOMY WITH REPAIR COLOVESICAL AND COLOVAGINAL FISTULA (N/A) CYSTOSCOPY WITH RETROGRADE PYELOGRAM/ BILATERAL TEMPORARY INDWELLING URETERAL STENT PLACEMENT (Bilateral)  Patient Location: PACU  Anesthesia Type:General  Level of Consciousness:  sedated, patient cooperative and responds to stimulation  Airway & Oxygen Therapy:Patient Spontanous Breathing and Patient connected to face mask oxgen  Post-op Assessment:  Report given to PACU RN and Post -op Vital signs reviewed and stable  Post vital signs:  Reviewed and stable  Last Vitals:  Filed Vitals:   02/09/16 1117 02/09/16 1122  BP: 96/29 87/27  Pulse: 79 81  Temp:    Resp: 12 17    Complications: No apparent anesthesia complications

## 2016-02-09 NOTE — Consult Note (Addendum)
WOC requested for preoperative stoma site marking.  Pt plans for surgery within the next half hour and is in presurgical area on a stretcher with limited mobility.  Discussed surgical procedure and stoma creation with patient and family.  Explained role of the [facility] nurse team.  Provided the patient with educational booklet and demonstrated  samples of pouching options.  Answered patient and family questions.   Examined patient lying, and sitting upright in order to place the marking in the patient's visual field, away from any creases or abdominal contour issues and within the rectus muscle.  Mark placed above the patient's belt line to avoid significant skin folds which occur in the lower areas.   Marked for colostomy in the LLQ  _8___ cm to the left of the umbilicus and __7__KU above the umbilicus.  Marked for ileostomy in the RLQ  _8___cm to the right of the umbilicus and  __5__ cm below the umbilicus.  Patient's abdomen cleansed with CHG wipes at site markings, allowed to air dry prior to marking. [facility] team will follow up with patient after surgery for continue ostomy care and teaching. Please re-consult if further assistance is needed.  Gae Dry MSN, RN, [place], [place], Secretary.

## 2016-02-09 NOTE — Anesthesia Preprocedure Evaluation (Addendum)
Anesthesia Evaluation  Patient identified by MRN, date of birth, ID band Patient awake    Reviewed: Allergy & Precautions, NPO status , Patient's Chart, lab work & pertinent test results  Airway Mallampati: II  TM Distance: >3 FB Neck ROM: Full    Dental no notable dental hx.    Pulmonary pneumonia, former smoker,    Pulmonary exam normal breath sounds clear to auscultation       Cardiovascular hypertension, Pt. on medications Normal cardiovascular exam Rhythm:Regular Rate:Normal     Neuro/Psych PSYCHIATRIC DISORDERS Depression CVA    GI/Hepatic Neg liver ROS, GERD  Medicated,  Endo/Other  diabetesHypothyroidism   Renal/GU Renal disease     Musculoskeletal negative musculoskeletal ROS (+)   Abdominal   Peds  Hematology negative hematology ROS (+)   Anesthesia Other Findings   Reproductive/Obstetrics negative OB ROS                            Anesthesia Physical  Anesthesia Plan  ASA: III  Anesthesia Plan: General and Epidural   Post-op Pain Management:    Induction: Intravenous  Airway Management Planned: Oral ETT  Additional Equipment:   Intra-op Plan:   Post-operative Plan: Extubation in OR  Informed Consent: I have reviewed the patients History and Physical, chart, labs and discussed the procedure including the risks, benefits and alternatives for the proposed anesthesia with the patient or authorized representative who has indicated his/her understanding and acceptance.   Dental advisory given  Plan Discussed with: CRNA  Anesthesia Plan Comments:         Anesthesia Quick Evaluation

## 2016-02-09 NOTE — H&P (Signed)
I have been asked to see the patient by Dr. [name], for evaluation and management of intraoperative stents.  History of present illness: A 82 year old female with a history of sigmoid diverticulitis which complicated by  Perirectal abscess and have colovesical fistula.  The patient is  Set to undergo sigmoidectomy and as per the procedure  General surgery like to have ureteral stents placed for guidance during the dissection.  Review of systems: A 12 point comprehensive review of systems was obtained and is negative unless otherwise stated in the history of present illness.  Patient Active Problem List   Diagnosis Date Noted  . Loss of weight 10/25/2015  . Thyroid activity decreased 10/25/2015  . Mild cognitive impairment with memory loss 10/25/2015  . Controlled type 2 diabetes mellitus without complication, without long-term current use of insulin ([place]) 10/25/2015  . Biloma following surgery   . Abdominal abscess ([place])   . Abscess   . Pyelonephritis 07/02/2015  . Dysphagia 07/02/2015  . Leukocytosis 07/02/2015  . Acute-on-chronic kidney injury ([place]) 07/02/2015  . Hyponatremia 07/02/2015  . Urinary tract infection, site not specified 07/02/2015  . Subacute delirium   . GERD (gastroesophageal reflux disease)   . Adenocarcinoma of liver s/p partial right hepatic lobectomy 06/14/2015 06/14/2015  . At risk for colon cancer 05/17/2015  . Unintentional weight loss 05/06/2015  . Hypothyroidism 05/03/2015  . Essential hypertension 05/03/2015  . Chronic constipation 04/15/2014  . Arthralgia of right knee 01/26/2013  . Arthralgia of right lower leg 01/26/2013  . Hyperlipidemia   . Thyroid disease   . Memory loss   . Disorder of bone and cartilage, unspecified     No current facility-administered medications on file prior to encounter.   Current Outpatient Prescriptions on File Prior to Encounter  Medication Sig Dispense Refill  . amLODipine (NORVASC) 5 MG tablet Take 5 mg by  mouth daily.    Marland Kitchen amoxicillin-clavulanate (AUGMENTIN) 875-125 MG tablet Take 1 tablet by mouth 2 (two) times daily.    Marland Kitchen aspirin 81 MG tablet Take 81 mg by mouth daily.     Marland Kitchen buPROPion (WELLBUTRIN XL) 150 MG 24 hr tablet Take 150 mg by mouth daily.    . feeding supplement (BOOST / RESOURCE BREEZE) LIQD Take 1 Container by mouth 2 (two) times daily between meals.     Marland Kitchen levothyroxine (SYNTHROID, LEVOTHROID) 100 MCG tablet Take 100 mcg by mouth daily before breakfast.    . ondansetron (ZOFRAN) 4 MG tablet Take 4 mg by mouth daily as needed for nausea or vomiting.    . pantoprazole (PROTONIX) 40 MG tablet Take 40 mg by mouth 2 (two) times daily before a meal.    . polyethylene glycol (MIRALAX / GLYCOLAX) packet Take 17 g by mouth daily.    . Probiotic Product (PROBIOTIC DAILY PO) Take 1 tablet by mouth daily.     Marland Kitchen saccharomyces boulardii (FLORASTOR) 250 MG capsule Take 250 mg by mouth 2 (two) times daily.    . simethicone (MYLICON) 789 MG chewable tablet Chew 125 mg by mouth 3 (three) times daily before meals.    Marland Kitchen acetaminophen (TYLENOL) 325 MG tablet Take 650 mg by mouth every 6 (six) hours as needed (pain).     Marland Kitchen HYDROcodone-acetaminophen (NORCO/VICODIN) 5-325 MG tablet Take 1 tablet by mouth every 6 (six) hours as needed for moderate pain.    Marland Kitchen LORazepam (ATIVAN) 0.5 MG tablet Take 0.5 mg by mouth 2 (two) times daily as needed for anxiety.     Marland Kitchen  ranitidine (ZANTAC) 150 MG capsule Take 150 mg by mouth daily as needed (indigestion).      Past Medical History  Diagnosis Date  . Hypertension   . Hyperlipidemia   . Thyroid disease   . Subacute delirium   . Essential and other specified forms of tremor   . Unspecified hypothyroidism   . Diverticulosis of colon (without mention of hemorrhage)   . Unspecified constipation   . Anal and rectal polyp   . Disorder of bone and cartilage, unspecified   . Pneumonia X 1  . GERD (gastroesophageal reflux disease)   . Diverticulitis of large intestine  without perforation or abscess without bleeding   . Malignant neoplasm of breast (female), unspecified site   . Breast cancer, right breast ([place]) 1971    "never had chemo or radiation"  . Liver cancer ([place]) 2016  . Stroke [facility])     after liver surgery - in 2016 no lasting weakness. does walk with walker  . Depression   . Frequent UTI   . Family history of adverse reaction to anesthesia     daughter and grandson are slow to wake up    Past Surgical History  Procedure Laterality Date  . Appendectomy    . Abdominal hysterectomy    . Tonsillectomy and adenoidectomy  1935  . Cholecystectomy    . Breast biopsy Right 1971  . Mastectomy Right 1971  . Tubal ligation    . Cataract extraction w/ intraocular lens  implant, bilateral Bilateral   . Rectal polypectomy  early 2000's  . Laparoscopy N/A 06/14/2015    Procedure: LAPAROSCOPY DIAGNOSTIC;  Surgeon: [name], MD;  Location: [facility]S;  Service: General;  Laterality: N/A;  . Open partial hepatectomy [83] N/A 06/14/2015    Procedure:  OPEN PARTIAL RIGHT HEPATECTOMY;  Surgeon: [name], MD;  Location: [facility];  Service: General;  Laterality: N/A;  converted to open @ 1447  . Liver biopsy  06/14/2015    Procedure: LIVER BIOPSY;  Surgeon: [name], MD;  Location: [facility];  Service: General;;    Social History  Substance Use Topics  . Smoking status: Former Smoker -- 0.50 packs/day for 40 years    Types: Cigarettes    Quit date: 08/13/2012  . Smokeless tobacco: Never Used     Comment: "quit smoking cigarettes years ago; I was probably in my 39's"  . Alcohol Use: Yes     Comment: once per month  - none in a year    Family History  Problem Relation Age of Onset  . Cancer Mother   . Hypertension Father     PE: Filed Vitals:   02/09/16 1112 02/09/16 1114 02/09/16 1117 02/09/16 1122  BP: '87/27 93/29 96/29 '$ 87/27  Pulse: 82 85 79 81  Temp:      TempSrc:      Resp: '13 18 12 17  '$ Height:      Weight:      SpO2: 98% 99% 98%  99%   Patient appears to be in no acute distress  patient is alert and oriented x3 Atraumatic normocephalic head No cervical or supraclavicular lymphadenopathy appreciated No increased work of breathing, no audible wheezes/rhonchi Regular sinus rhythm/rate Abdomen is soft, nontender, nondistended, no CVA or suprapubic tenderness Lower extremities are symmetric without appreciable edema Grossly neurologically intact No identifiable skin lesions   Recent Labs  02/07/16 1125  WBC 6.2  HGB 10.7*  HCT 34.5*    Recent Labs  02/07/16 1038  NA 136  K 3.8  CL 101  CO2 28  GLUCOSE 100*  BUN 13  CREATININE 1.13*  CALCIUM 10.1    Recent Labs  02/07/16 1125  INR 1.18   No results for input(s): LABURIN in the last 72 hours. Results for orders placed or performed during the hospital encounter of 02/07/16  Aerobic/Anaerobic Culture (surgical/deep wound)     Status: None (Preliminary result)   Collection Time: 02/07/16  1:42 PM  Result Value Ref Range Status   Specimen Description ABSCESS PELVIS  Final   Special Requests NONE  Final   Gram Stain   Final    MODERATE WBC PRESENT,BOTH PMN AND MONONUCLEAR NO ORGANISMS SEEN    Culture   Final    NO GROWTH 2 DAYS NO ANAEROBES ISOLATED; CULTURE IN PROGRESS FOR 5 DAYS   Report Status PENDING  Incomplete    Imaging: I dependent V the patient's CT scan which demonstrates phlegmon between theectum and the bladder with definite fluid collection in the right lower quadrant. There is air as well as bowel contents that appeared be in the vagina. This consistent with a colovaginal fistula.  Imp: The patient has a perforated diverticulitis with associated pericolonic abscess with fistula to the vagina..  Recommendations: As part of the procedurewwe will plan to place an per indwelling ureteral stents. I have spoken to the patient about the procedure including the risks and the benefits and she has agreed to proceed. Once the stents are  no longer needed and will likely be removed.   Louis Meckel W

## 2016-02-09 NOTE — Anesthesia Postprocedure Evaluation (Signed)
Anesthesia Post Note  Patient: Destiny Velasquez  Procedure(s) Performed: Procedure(s) (LRB): OPEN SIGMOID  COLECTOMY WITH REPAIR COLOVESICAL AND COLOVAGINAL FISTULA (N/A) CYSTOSCOPY WITH RETROGRADE PYELOGRAM/ BILATERAL TEMPORARY INDWELLING URETERAL STENT PLACEMENT (Bilateral)  Patient location during evaluation: PACU Anesthesia Type: General and Epidural Level of consciousness: sedated and patient cooperative Pain management: pain level controlled Vital Signs Assessment: post-procedure vital signs reviewed and stable Respiratory status: spontaneous breathing Cardiovascular status: stable Anesthetic complications: no    Last Vitals:  Filed Vitals:   02/09/16 1828 02/09/16 2053  BP: 110/42 100/50  Pulse: 64 66  Temp:  36.4 C  Resp:  15    Last Pain:  Filed Vitals:   02/09/16 2054  PainSc: 2                  Nolon Nations

## 2016-02-09 NOTE — Op Note (Signed)
PRE-OPERATIVE DIAGNOSIS: diverticulitis with possible colovaginal fistula  POST-OPERATIVE DIAGNOSIS:  Diverticulitis with chronic inflammation and chronic abscess cavity  PROCEDURE:  Procedure(s): Open partial colectomy with mobilization of splenic flexure  SURGEON:  Surgeon(s): [name], MD  Assistant:   Candis Musa, RNFA  ANESTHESIA:   epidural and general  DRAINS: none   LOCAL MEDICATIONS USED:  NONE  SPECIMEN:  Source of Specimen:  descending colon/proximal sigmoid colon  DISPOSITION OF SPECIMEN:  PATHOLOGY  COUNTS:  YES  DICTATION: .Dragon Dictation  PLAN OF CARE: Admit to inpatient   PATIENT DISPOSITION:  PACU - hemodynamically stable.  FINDINGS:  Chronic inflammation of descending colon/proximal sigmoid colon with what looks like prior extraperitoneal abscess cavity.  No purulent drainage.    EBL: <50 mL  PROCEDURE:  Patient was identified in the holding area and taken operating room where she was placed supine on the operating room table. General endotracheal anesthesia was induced. She was placed into the low lithotomy position. Dr. Louis Meckel of urology placed bilateral temporary ureteral stents.  The abdomen was then prepped and draped.  A timeout was performed according to the surgical safety checklist. When all was correct, we continued. A lower midline incision was made going to just above the umbilicus. The fascia was divided with the cautery. The abdomen was entered sharply and then the fascial incision was carried out the length of the skin incision. The Bookwalter retractor was set up for assistance with visualization.  The small bowel was packed into the right upper quadrant along with the cecum. The junction of the descending colon and sigmoid colon was seen to be very inflamed. This was taken off the pelvic sidewall with combination of cautery and blunt dissection. There was no purulent drainage seen. The chronic woody inflammation was taken down. The  sigmoid was divided in the midportion. There were not significant numbers of diverticulitis seen distal to this. The Enseal was then used to divide the mesentery close to the bowel. This was felt not to represent a malignancy since she had had a recent colonoscopy. The splenic flexure was then mobilized.  The chronic inflammation and the retroperitoneum was examined. There was no evidence of purulent fluid collections in the back. The sigmoid was not adherent to the bladder or the vagina.   Once adequate mobilization had been performed, the midportion of the descending colon was divided with the 75 mm GIA stapler. The 2 ends of the colon were placed together and a peristaltic side-to-side functional end-to-end anastomosis. The extra air was evacuated. No bleeding was seen. The staple line was oversewn with a layer of Vicryl. Abdomen was irrigated with saline and on gentamicin. The: Protocol was followed and new drapes, suction, and cautery were placed.    The specimen was sent for frozen section.  This returned as inflammatory.  The fascia was then closed using #1 running looped PDS suture. Skin was irrigated and closed with staples. A honeycomb dressing was placed. The patient was allowed to emerge from anesthesia and was taken the PACU in stable condition. Needle, sponge, and instrument counts were correct 2.

## 2016-02-09 NOTE — Interval H&P Note (Signed)
History and Physical Interval Note:  02/09/2016 11:33 AM  Destiny Velasquez  has presented today for surgery, with the diagnosis of diverticulitis with fistula to bladder and vagina  The various methods of treatment have been discussed with the patient and family. After consideration of risks, benefits and other options for treatment, the patient has consented to  Procedure(s): OPEN SIGMOID  COLECTOMY WITH REPAIR COLOVESICAL AND COLOVAGINAL FISTULA (N/A) CYSTOSCOPY WITH RETROGRADE PYELOGRAM/ BILATERAL TEMPORARY INDWELLING URETERAL STENT PLACEMENT (Bilateral) as a surgical intervention .  The patient's history has been reviewed, patient examined, no change in status, stable for surgery.  I have reviewed the patient's chart and labs.  Questions were answered to the patient's satisfaction.     Destiny Velasquez

## 2016-02-09 NOTE — Anesthesia Procedure Notes (Addendum)
Epidural   Staffing Anesthesiologist: Nolon Nations  Preanesthetic Checklist Completed: patient identified, surgical consent, pre-op evaluation, timeout performed, IV checked, risks and benefits discussed and monitors and equipment checked  Epidural Prep: ChloraPrep and site prepped and draped Patient monitoring: heart rate, cardiac monitor, continuous pulse ox and blood pressure Location: thoracic (1-12) (T8-9) Injection technique: LOR air and LOR saline  Needle:  Needle type: Tuohy  Needle gauge: 17 G Needle length: 9 cm Needle insertion depth: 5 (5.5) cm Catheter type: closed end flexible Catheter size: 19 Gauge Catheter at skin depth: 12 cm Test dose: negative and 1.5% lidocaine with Epi 1:200 K  Assessment Events: blood not aspirated, injection not painful, no injection resistance, negative IV test and no paresthesia  Additional Notes Loss of cold sensation to T6 level  Procedure Name: Intubation Date/Time: 02/09/2016 12:37 PM Performed by: Freddie Breech Pre-anesthesia Checklist: Patient identified, Emergency Drugs available, Suction available, Patient being monitored and Timeout performed Patient Re-evaluated:Patient Re-evaluated prior to inductionOxygen Delivery Method: Circle system utilized Preoxygenation: Pre-oxygenation with 100% oxygen Intubation Type: IV induction Ventilation: Mask ventilation without difficulty and Oral airway inserted - appropriate to patient size Laryngoscope Size: 3 and Glidescope Grade View: Grade I Tube size: 7.0 mm Number of attempts: 1 Airway Equipment and Method: Patient positioned with wedge pillow and Video-laryngoscopy Placement Confirmation: positive ETCO2,  CO2 detector and breath sounds checked- equal and bilateral Secured at: 21 cm Tube secured with: Tape Dental Injury: Teeth and Oropharynx as per pre-operative assessment

## 2016-02-09 NOTE — Op Note (Signed)
Preoperative diagnosis:  1. Perforated sigmoid diverticulitis with colovaginal fistula   Postoperative diagnosis:  1. Same   Procedure:  1. Cystoscopy 2. bilateral temporary ureteral stent placement 3. bilateral retrograde pyelography with interpretation   Surgeon: Ardis Hughs, MD  Anesthesia: General  Complications: None  Intraoperative findings:  Bilateral retrograde pyelography demonstrated a no filling defect within the bilateral ureter nor any other abnormalities within either collecting system.  EBL: Minimal  Specimens: None  Indication: Destiny Velasquez is a 82 y.o. patient with history of perforated diverticulitis with a colovaginal fistula. General surgery requested that ureteral stents be placed prior to starting the pelvic dissection to help identify the ureters bilaterally. After reviewing the management options for treatment, he elected to proceed with the above surgical procedure(s). We have discussed the potential benefits and risks of the procedure, side effects of the proposed treatment, the likelihood of the patient achieving the goals of the procedure, and any potential problems that might occur during the procedure or recuperation. Informed consent has been obtained.  Description of procedure:  The patient was taken to the operating room and general anesthesia was induced.  The patient was placed in the dorsal lithotomy position, prepped and draped in the usual sterile fashion, and preoperative antibiotics were administered. A preoperative time-out was performed.   Cystourethroscopy was performed.  The patient's urethra was examined and was normal.  The bladder mucosa was inflamed but otherwise normal. There were no mucosal abnormalities including bullous edema, evidence of fistula, or tumors. The ureteral orifice was orthotopic bilaterally. I then cannulated the patient's left ureteral orifice with a 5 [place] open ureteral catheter and injected 10 mL Omnipaque  contrast performing a retrograde pyelogram which demonstrated a normal color ureter without evidence of filling defects without hydronephrosis. The catheter was then advanced a 22 cm which was at the level of the patient's left UPJ. I then inserted a second open-ended ureteral catheter through the second port of the cystoscope and was able to simultaneously advanced that through the right ureteral orifice. I again injected 10 mL [place] contrast into the catheter performing a right retrograde pyelogram which was normal-normal caliber ureter without evidence of filling defect. I then advanced this up to 22 cm which was a level of the patient's right U PEJ. This point I then gently broke down the scope and removed the cystoscopic sheath carefully to ensure that the stents remained in good position which I was able to confirm with fluoroscopy. I then cut the right stent at a right angle and placed a Foley catheter, 16 [place]. I then used a "Goldberg" adapter for the Foley catheter which I was able to insert the stents into. The stents were then tied to the Foley catheter with a 2-0 silk tie.  At this point, the patient was turned over to Dr. Barry Dienes.  Dispo: the stents were removed at the end of the case.   Ardis Hughs, M.D.

## 2016-02-10 ENCOUNTER — Encounter (HOSPITAL_COMMUNITY): Payer: Self-pay | Admitting: General Surgery

## 2016-02-10 LAB — CBC
HEMATOCRIT: 32.9 % — AB (ref 36.0–46.0)
HEMOGLOBIN: 10.5 g/dL — AB (ref 12.0–15.0)
MCH: 25.8 pg — AB (ref 26.0–34.0)
MCHC: 31.9 g/dL (ref 30.0–36.0)
MCV: 80.8 fL (ref 78.0–100.0)
Platelets: 218 10*3/uL (ref 150–400)
RBC: 4.07 MIL/uL (ref 3.87–5.11)
RDW: 16.7 % — ABNORMAL HIGH (ref 11.5–15.5)
WBC: 14.7 10*3/uL — ABNORMAL HIGH (ref 4.0–10.5)

## 2016-02-10 LAB — BASIC METABOLIC PANEL
ANION GAP: 8 (ref 5–15)
BUN: 20 mg/dL (ref 6–20)
CALCIUM: 8.6 mg/dL — AB (ref 8.9–10.3)
CHLORIDE: 99 mmol/L — AB (ref 101–111)
CO2: 24 mmol/L (ref 22–32)
Creatinine, Ser: 1.19 mg/dL — ABNORMAL HIGH (ref 0.44–1.00)
GFR calc non Af Amer: 40 mL/min — ABNORMAL LOW (ref >60)
GFR, EST AFRICAN AMERICAN: 46 mL/min — AB (ref >60)
GLUCOSE: 284 mg/dL — AB (ref 65–99)
Potassium: 4.4 mmol/L (ref 3.5–5.1)
Sodium: 131 mmol/L — ABNORMAL LOW (ref 135–145)

## 2016-02-10 LAB — MAGNESIUM: MAGNESIUM: 1.7 mg/dL (ref 1.7–2.4)

## 2016-02-10 LAB — PROTIME-INR
INR: 1.37 (ref 0.00–1.49)
Prothrombin Time: 17 seconds — ABNORMAL HIGH (ref 11.6–15.2)

## 2016-02-10 LAB — PHOSPHORUS: Phosphorus: 2.6 mg/dL (ref 2.5–4.6)

## 2016-02-10 MED ORDER — WHITE PETROLATUM GEL
Status: AC
  Administered 2016-02-10: 1
  Filled 2016-02-10: qty 1

## 2016-02-10 NOTE — Progress Notes (Signed)
1 Day Post-Op  Subjective: NO flatus, but liquidy stool.  Sore.    Objective: Vital signs in last 24 hours: Temp:  [97 F (36.1 C)-98.3 F (36.8 C)] 98.3 F (36.8 C) (06/30 1232) Pulse Rate:  [59-72] 70 (06/30 1232) Resp:  [9-19] 18 (06/30 1232) BP: (88-119)/(41-88) 119/48 mmHg (06/30 1232) SpO2:  [95 %-100 %] 99 % (06/30 1232) Last BM Date: 02/10/16  Intake/Output from previous day: 06/29 0701 - 06/30 0700 In: 2619.8 [P.O.:360; I.V.:2191; IV Piggyback:50] Out: 3557 [Urine:900; Stool:600; Blood:150] Intake/Output this shift: Total I/O In: 240 [P.O.:240] Out: 400 [Urine:400]  General appearance: alert, cooperative and no distress Resp: breathing comfortably GI: soft, non distended, dressing d/c/i.    Lab Results:   Recent Labs  02/09/16 1923 02/10/16 0608  WBC 16.7* 14.7*  HGB 11.2* 10.5*  HCT 35.7* 32.9*  PLT 224 218   BMET  Recent Labs  02/09/16 1923 02/10/16 0608  NA  --  131*  K  --  4.4  CL  --  99*  CO2  --  24  GLUCOSE  --  284*  BUN  --  20  CREATININE 1.38* 1.19*  CALCIUM  --  8.6*   PT/INR  Recent Labs  02/10/16 0608  LABPROT 17.0*  INR 1.37   ABG No results for input(s): PHART, HCO3 in the last 72 hours.  Invalid input(s): PCO2, PO2  Studies/Results: Dg Cystogram  02/09/2016  CLINICAL DATA:  Cystoscopy with retrograde pyelogram. 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 EXAM: CYSTOGRAM TECHNIQUE: Three fluoroscopic images from intraoperative retrograde pyelogram are presented for interpretation. FLUOROSCOPY TIME:  45 seconds. COMPARISON:  CT of the abdomen 02/07/2016 FINDINGS: Fluoroscopic images of the abdomen and pelvis demonstrate retrograde filling of bilateral ureters which demonstrate normal caliber. Ureteral stents are seen. Filling of bilateral renal collecting systems is also noted, without evidence of hydronephrosis. IMPRESSION: Retrograde filling of normal in caliber ureters and renal collecting systems  bilaterally. Ureteral stents were placed. Electronically Signed   By: Fidela Salisbury M.D.   On: 02/09/2016 13:54    Anti-infectives: Anti-infectives    Start     Dose/Rate Route Frequency Ordered Stop   02/10/16 0100  cefoTEtan (CEFOTAN) 2 g in dextrose 5 % 50 mL IVPB     2 g 100 mL/hr over 30 Minutes Intravenous Every 12 hours 02/09/16 1827 02/10/16 0850   02/09/16 1445  clindamycin (CLEOCIN) 900 mg, gentamicin (GARAMYCIN) 240 mg in sodium chloride 0.9 % 1,000 mL for intraperitoneal lavage      Intraperitoneal To Surgery 02/09/16 1441 02/09/16 1417   02/09/16 1200  cefoTEtan (CEFOTAN) 2 g in dextrose 5 % 50 mL IVPB  Status:  Discontinued     2 g 100 mL/hr over 30 Minutes Intravenous To [facility]Stay [facility] 02/08/16 1036 02/08/16 1038   02/09/16 1200  clindamycin (CLEOCIN) 900 mg, gentamicin (GARAMYCIN) 240 mg in sodium chloride 0.9 % 1,000 mL for intraperitoneal lavage  Status:  Discontinued      Intraperitoneal To Surgery 02/08/16 1034 02/09/16 1441   02/09/16 1200  cefoTEtan in Dextrose 5% (CEFOTAN) IVPB 2 g     2 g Intravenous To [facility]Stay Surgical 02/08/16 1040 02/09/16 1230   02/09/16 0854  neomycin (MYCIFRADIN) tablet 1,000 mg  Status:  Discontinued     1,000 mg Oral 3 times per day 02/09/16 0854 02/09/16 1809   02/09/16 0854  metroNIDAZOLE (FLAGYL) tablet 1,000 mg  Status:  Discontinued     1,000 mg Oral 3  times per day 02/09/16 0854 02/09/16 1809      Assessment/Plan: s/p Procedure(s): OPEN SIGMOID  COLECTOMY WITH REPAIR COLOVESICAL AND COLOVAGINAL FISTULA (N/A) CYSTOSCOPY WITH RETROGRADE PYELOGRAM/ BILATERAL TEMPORARY INDWELLING URETERAL STENT PLACEMENT (Bilateral) Continue foley due to urinary output monitoring flexiseal for at least another 24 hours due to copious post obstructive loose stool.  Clears.   PT c/s.   LOS: 1 day    [facility] 02/10/2016

## 2016-02-10 NOTE — Evaluation (Signed)
Physical Therapy Evaluation Patient Details Name: Samra Pesch MRN: [id number] DOB: 1925/12/20 Today's Date: 02/10/2016   History of Present Illness  pt presents post Open Sigmoid colectomy with Repair of Colovesical and Colovaginal Fistula, and bil Temporary Uretral Stents.  pt with hx of DM, HTN, CVA, Breast CA with Mastectomy, Liver CA, and Depression.    Clinical Impression  Pt indicates feeling fatigued and weak during mobility and was only able to get up to the chair.  Pt is current resident of [facility] and pending progress may be able to return there with increased A, however at this time feel pt may need to consider short SNF rehab prior to returning to her apartment.  Will continue to follow.      Follow Up Recommendations SNF    Equipment Recommendations  None recommended by PT    Recommendations for Other Services       Precautions / Restrictions Precautions Precautions: Fall Precaution Comments: Abdominal Incisions and Epidural Restrictions Weight Bearing Restrictions: No      Mobility  Bed Mobility Overal bed mobility: Needs Assistance Bed Mobility: Supine to Sit     Supine to sit: Supervision;HOB elevated     General bed mobility comments: pt utilizes bed rails, but was able to complete without physical A.    Transfers Overall transfer level: Needs assistance Equipment used: Rolling walker (2 wheeled) Transfers: Sit to/from Stand Sit to Stand: Min guard         General transfer comment: cues for UE use and pt endorses feeling unsteady upon standing.    Ambulation/Gait Ambulation/Gait assistance: Min guard Ambulation Distance (Feet): 3 Feet (steps) Assistive device: Rolling walker (2 wheeled) Gait Pattern/deviations: Step-through pattern;Decreased stride length;Trunk flexed;Shuffle     General Gait Details: pt attempted to take 3 steps forwards, but indicated feeling fatigued and wanted to sit in recliner.    Stairs             Wheelchair Mobility    Modified Rankin (Stroke Patients Only)       Balance Overall balance assessment: Needs assistance Sitting-balance support: Single extremity supported;No upper extremity supported;Feet supported Sitting balance-Leahy Scale: Fair     Standing balance support: Bilateral upper extremity supported;During functional activity Standing balance-Leahy Scale: Poor                               Pertinent Vitals/Pain Pain Assessment: No/denies pain    Home Living Family/patient expects to be discharged to:: Skilled nursing facility                 Additional Comments: pt lives at [facility], but can go to a SNF bed if needed.    Prior Function Level of Independence: Needs assistance   Gait / Transfers Assistance Needed: Used RW and only ambulated short distances.  Inher apartment she often did not use RW.    ADL's / Homemaking Assistance Needed: pt had A x2 per week for full shower, otherwise performed her own ADLs.  pt goes to dining room for meals and staff perform cleaning chores.          Hand Dominance        Extremity/Trunk Assessment   Upper Extremity Assessment: Overall WFL for tasks assessed           Lower Extremity Assessment: Generalized weakness      Cervical / Trunk Assessment: Kyphotic  Communication   Communication: No difficulties  Cognition Arousal/Alertness: Awake/alert Behavior During Therapy: WFL for tasks assessed/performed Overall Cognitive Status: Within Functional Limits for tasks assessed (Needed questions repeated at times.)                      General Comments      Exercises        Assessment/Plan    PT Assessment Patient needs continued PT services  PT Diagnosis Difficulty walking;Generalized weakness   PT Problem List Decreased strength;Decreased activity tolerance;Decreased balance;Decreased mobility;Decreased knowledge of use of DME  PT Treatment Interventions DME  instruction;Gait training;Functional mobility training;Therapeutic activities;Therapeutic exercise;Balance training;Patient/family education   PT Goals (Current goals can be found in the Care Plan section) Acute Rehab PT Goals Patient Stated Goal: Go home. PT Goal Formulation: With patient Time For Goal Achievement: 02/17/16 Potential to Achieve Goals: Good    Frequency Min 3X/week   Barriers to discharge        Co-evaluation               End of Session Equipment Utilized During Treatment: Gait belt Activity Tolerance: Patient limited by fatigue Patient left: in chair;with call bell/phone within reach Nurse Communication: Mobility status         Time: 6237-6283 PT Time Calculation (min) (ACUTE ONLY): 20 min   Charges:   PT Evaluation $PT Eval Moderate Complexity: 1 Procedure     PT G CodesCatarina Hartshorn, [place] 02/10/2016, 9:39 AM

## 2016-02-10 NOTE — Anesthesia Post-op Follow-up Note (Signed)
  Anesthesia Pain Follow-up Note  Patient: Destiny Velasquez  Post op day 1, catheter day 2  Date of Follow-up: 02/10/2016 Time: 5:19 PM  Last Vitals:  Filed Vitals:   02/10/16 0653 02/10/16 1232  BP: 113/55 119/48  Pulse: 72 70  Temp: 36.8 C 36.8 C  Resp: 19 18    Level of Consciousness: alert  Pain: mild , gas pain. 3/10 at worst. Got a "pain pill" and says she has no pain now.  Side Effects:None  Catheter Site Exam:clean, dry, no drainage  Epidural / Intrathecal    Start     Dose/Rate Route Frequency Ordered Stop   02/09/16 1530  ropivacaine (PF) 2 mg/ml (0.2%) (NAROPIN) epidural     6 mL/hr 6 mL/hr  Epidural Continuous 02/09/16 1511        T8-9 epidural with Ropivacaine 0.2% @ 8m/hr. Pt very comfortable and ambulating well. Good PO intake as well.    Plan: Continue current therapy Consider increasing infusion rate if pt continues to have break through pain.   JNolon Nations

## 2016-02-11 LAB — CBC
HEMATOCRIT: 30 % — AB (ref 36.0–46.0)
HEMOGLOBIN: 9.3 g/dL — AB (ref 12.0–15.0)
MCH: 25.5 pg — ABNORMAL LOW (ref 26.0–34.0)
MCHC: 31 g/dL (ref 30.0–36.0)
MCV: 82.2 fL (ref 78.0–100.0)
PLATELETS: 193 10*3/uL (ref 150–400)
RBC: 3.65 MIL/uL — AB (ref 3.87–5.11)
RDW: 17 % — AB (ref 11.5–15.5)
WBC: 11.3 10*3/uL — AB (ref 4.0–10.5)

## 2016-02-11 LAB — BASIC METABOLIC PANEL
Anion gap: 4 — ABNORMAL LOW (ref 5–15)
BUN: 10 mg/dL (ref 6–20)
CALCIUM: 8.5 mg/dL — AB (ref 8.9–10.3)
CHLORIDE: 104 mmol/L (ref 101–111)
CO2: 26 mmol/L (ref 22–32)
CREATININE: 1.01 mg/dL — AB (ref 0.44–1.00)
GFR, EST AFRICAN AMERICAN: 56 mL/min — AB (ref >60)
GFR, EST NON AFRICAN AMERICAN: 49 mL/min — AB (ref >60)
Glucose, Bld: 202 mg/dL — ABNORMAL HIGH (ref 65–99)
Potassium: 4.2 mmol/L (ref 3.5–5.1)
SODIUM: 134 mmol/L — AB (ref 135–145)

## 2016-02-11 MED ORDER — HYDROCORTISONE 2.5 % RE CREA
1.0000 "application " | TOPICAL_CREAM | Freq: Four times a day (QID) | RECTAL | Status: DC | PRN
  Filled 2016-02-11 (×2): qty 28.35

## 2016-02-11 MED ORDER — SIMETHICONE 80 MG PO CHEW
80.0000 mg | CHEWABLE_TABLET | Freq: Three times a day (TID) | ORAL | Status: DC
  Administered 2016-02-11 – 2016-02-14 (×10): 80 mg via ORAL
  Filled 2016-02-11 (×10): qty 1

## 2016-02-11 MED ORDER — HYDROCODONE-ACETAMINOPHEN 5-325 MG PO TABS
1.0000 | ORAL_TABLET | Freq: Four times a day (QID) | ORAL | Status: DC | PRN
  Administered 2016-02-11 – 2016-02-13 (×3): 1 via ORAL
  Filled 2016-02-11 (×3): qty 1

## 2016-02-11 NOTE — Anesthesia Post-op Follow-up Note (Signed)
Patient afebrile, vital signs stable.  Adequate analgesia. Site clean, dry, and intact. Patients questions answered.  Plan continue current infusion Will discontinue per surgeons request Anesthesia Pain Follow-up Note  Patient: Destiny Velasquez  Day   Date of Follow-up: 02/11/2016 Time: 10:42 AM  Last Vitals:  Filed Vitals:   02/10/16 1232 02/10/16 2033  BP: 119/48 115/51  Pulse: 70 70  Temp: 36.8 C 36.9 C  Resp: 18 19    Level of Consciousness: alert  Pain: mild   Side Effects:None  Catheter Site Exam:clean  Epidural / Intrathecal    Start     Dose/Rate Route Frequency Ordered Stop   02/09/16 1530  ropivacaine (PF) 2 mg/ml (0.2%) (NAROPIN) epidural     6 mL/hr 6 mL/hr  Epidural Continuous 02/09/16 1511         Plan: Continue current therapy  Annaleah Arata S

## 2016-02-11 NOTE — NC FL2 (Signed)
[place] LEVEL OF CARE SCREENING TOOL     IDENTIFICATION  Patient Name: Lania Zawistowski Birthdate: 02-01-1926 Sex: female Admission Date (Current Location): 02/09/2016  [facility] and [place] Number:  Herbalist and Address:  [facility], [place] [address], [place], [place] [address]      Provider Number: [id number]  Attending Physician Name and Address:  [name], MD  Relative Name and Phone Number:       Current Level of Care: Hospital Recommended Level of Care: [place] Prior Approval Number:    Date Approved/Denied:   PASRR Number:  [id number])  Discharge Plan: SNF    Current Diagnoses: Patient Active Problem List   Diagnosis Date Noted  . Diverticulitis 02/09/2016  . Loss of weight 10/25/2015  . Thyroid activity decreased 10/25/2015  . Mild cognitive impairment with memory loss 10/25/2015  . Controlled type 2 diabetes mellitus without complication, without long-term current use of insulin ([place]) 10/25/2015  . Biloma following surgery   . Abdominal abscess ([place])   . Abscess   . Pyelonephritis 07/02/2015  . Dysphagia 07/02/2015  . Leukocytosis 07/02/2015  . Acute-on-chronic kidney injury ([place]) 07/02/2015  . Hyponatremia 07/02/2015  . Urinary tract infection, site not specified 07/02/2015  . Subacute delirium   . GERD (gastroesophageal reflux disease)   . Adenocarcinoma of liver s/p partial right hepatic lobectomy 06/14/2015 06/14/2015  . At risk for colon cancer 05/17/2015  . Unintentional weight loss 05/06/2015  . Hypothyroidism 05/03/2015  . Essential hypertension 05/03/2015  . Chronic constipation 04/15/2014  . Arthralgia of right knee 01/26/2013  . Arthralgia of right lower leg 01/26/2013  . Hyperlipidemia   . Thyroid disease   . Memory loss   . Disorder of bone and cartilage, unspecified     Orientation RESPIRATION BLADDER Height & Weight     Self, Time, Situation, Place  Normal  Incontinent Weight: 123 lb (55.792 kg) Height:  '5\' 3"'$  (160 cm)  BEHAVIORAL SYMPTOMS/MOOD NEUROLOGICAL BOWEL NUTRITION STATUS   (none)  (Pt had stroke 2016) Continent Diet (Full liquid)  AMBULATORY STATUS COMMUNICATION OF NEEDS Skin   Extensive Assist Verbally Surgical wounds                       Personal Care Assistance Level of Assistance  Bathing, Feeding, Dressing Bathing Assistance: Limited assistance Feeding assistance: Independent Dressing Assistance: Limited assistance     Functional Limitations Info  Sight, Hearing, Speech Sight Info: Adequate Hearing Info: Adequate Speech Info: Adequate    SPECIAL CARE FACTORS FREQUENCY  PT (By licensed PT)     PT Frequency:  (5x/week)              Contractures Contractures Info: Not present    Additional Factors Info  Code Status, Allergies Code Status Info:  (full) Allergies Info:  (Levaquin)           Current Medications (02/11/2016):  This is the current hospital active medication list Current Facility-Administered Medications  Medication Dose Route Frequency Provider Last Rate Last Dose  . acetaminophen (TYLENOL) tablet 650 mg  650 mg Oral Q6H PRN [name], MD   650 mg at 02/11/16 0332  . alum & mag hydroxide-simeth (MAALOX/MYLANTA) 200-200-20 MG/5ML suspension 30 mL  30 mL Oral Q6H PRN [name], MD      . amLODipine (NORVASC) tablet 5 mg  5 mg Oral Daily [name], MD   5 mg at 02/11/16 0945  .  buPROPion (WELLBUTRIN XL) 24 hr tablet 150 mg  150 mg Oral Daily [name], MD   150 mg at 02/11/16 0944  . dextrose 5 % and 0.45 % NaCl with KCl 20 mEq/L infusion   Intravenous Continuous Armandina Gemma, MD 100 mL/hr at 02/11/16 0355    . diphenhydrAMINE (BENADRYL) 12.5 MG/5ML elixir 12.5 mg  12.5 mg Oral Q6H PRN [name], MD       Or  . diphenhydrAMINE (BENADRYL) injection 12.5 mg  12.5 mg Intravenous Q6H PRN [name], MD      . feeding supplement (BOOST / RESOURCE BREEZE) liquid 1 Container  1  Container Oral BID BM [name], MD   1 Container at 02/11/16 0946  . hydrocortisone (ANUSOL-HC) 2.5 % rectal cream 1 application  1 application Topical QID PRN [name], MD      . levothyroxine (SYNTHROID, LEVOTHROID) tablet 100 mcg  100 mcg Oral QAC breakfast [name], MD   100 mcg at 02/11/16 0759  . LORazepam (ATIVAN) tablet 0.5 mg  0.5 mg Oral BID PRN [name], MD   0.5 mg at 02/10/16 2318  . ondansetron (ZOFRAN) tablet 4 mg  4 mg Oral Q6H PRN [name], MD       Or  . ondansetron (ZOFRAN) injection 4 mg  4 mg Intravenous Q6H PRN [name], MD   4 mg at 02/09/16 2152  . pantoprazole (PROTONIX) EC tablet 40 mg  40 mg Oral Q1200 [name], MD   40 mg at 02/10/16 0811  . ropivacaine (PF) 2 mg/ml (0.2%) (NAROPIN) epidural  6 mL/hr Epidural Continuous Nolon Nations, MD 6 mL/hr at 02/11/16 0702 6 mL/hr at 02/11/16 0702  . saccharomyces boulardii (FLORASTOR) capsule 250 mg  250 mg Oral BID [name], MD   250 mg at 02/11/16 0945  . simethicone (MYLICON) chewable tablet 80 mg  80 mg Oral TID AC Armandina Gemma, MD         Discharge Medications: Please see discharge summary for a list of discharge medications.  Relevant Imaging Results:  Relevant Lab Results:   Additional Information SS#:[id number]  Junie Spencer, LCSW

## 2016-02-11 NOTE — Progress Notes (Signed)
Patient ID: Destiny Velasquez, female   DOB: June 16, 1926, 82 y.o.   MRN: [id number]  [facility]  Subjective: POD#2 - patient up in chair, daughter at bedside.  Complains of hemorrhoid pain.  Tolerating full liquid diet.  Does not want to take Vicodin - makes her "loopy".  Objective: Vital signs in last 24 hours: Temp:  [98.3 F (36.8 C)-98.5 F (36.9 C)] 98.5 F (36.9 C) (06/30 2033) Pulse Rate:  [70] 70 (06/30 2033) Resp:  [18-19] 19 (06/30 2033) BP: (115-119)/(48-51) 115/51 mmHg (06/30 2033) SpO2:  [99 %] 99 % (06/30 2033) Last BM Date: 02/10/16  Intake/Output from previous day: 06/30 0701 - 07/01 0700 In: 2953.3 [P.O.:740; I.V.:2213.3] Out: 2750 [Urine:2750] Intake/Output this shift: Total I/O In: 460 [P.O.:460] Out: 200 [Urine:200]  Physical Exam: HEENT - sclerae clear, mucous membranes moist Abdomen - soft, mild tenderness; dressing intact Ext - no edema, non-tender Neuro - alert & oriented, no focal deficits  Lab Results:   Recent Labs  02/10/16 0608 02/11/16 0643  WBC 14.7* 11.3*  HGB 10.5* 9.3*  HCT 32.9* 30.0*  PLT 218 193   BMET  Recent Labs  02/10/16 0608 02/11/16 0643  NA 131* 134*  K 4.4 4.2  CL 99* 104  CO2 24 26  GLUCOSE 284* 202*  BUN 20 10  CREATININE 1.19* 1.01*  CALCIUM 8.6* 8.5*   PT/INR  Recent Labs  02/10/16 0608  LABPROT 17.0*  INR 1.37   Comprehensive Metabolic Panel:    Component Value Date/Time   NA 134* 02/11/2016 0643   NA 131* 02/10/2016 0608   NA 138 11/30/2015 1033   NA 134* 10/27/2015   NA 133* 09/12/2015   K 4.2 02/11/2016 0643   K 4.4 02/10/2016 0608   K 3.3* 11/30/2015 1033   CL 104 02/11/2016 0643   CL 99* 02/10/2016 0608   CO2 26 02/11/2016 0643   CO2 24 02/10/2016 0608   CO2 28 11/30/2015 1033   BUN 10 02/11/2016 0643   BUN 20 02/10/2016 0608   BUN 13.3 11/30/2015 1033   BUN 21 10/27/2015   BUN 16 09/12/2015   CREATININE 1.01* 02/11/2016 0643   CREATININE 1.19*  02/10/2016 0608   CREATININE 1.0 11/30/2015 1033   CREATININE 1.1 10/27/2015   CREATININE 1.3* 09/12/2015   GLUCOSE 202* 02/11/2016 0643   GLUCOSE 284* 02/10/2016 0608   GLUCOSE 122 11/30/2015 1033   GLUCOSE 132* 07/18/2015 0926   GLUCOSE 119* 03/30/2015 0813   CALCIUM 8.5* 02/11/2016 0643   CALCIUM 8.6* 02/10/2016 0608   CALCIUM 10.1 11/30/2015 1033   AST 15 02/07/2016 1038   AST 14 11/30/2015 1033   AST 21 07/18/2015 0926   ALT 8* 02/07/2016 1038   ALT <9 11/30/2015 1033   ALT 11 07/18/2015 0926   ALKPHOS 68 02/07/2016 1038   ALKPHOS 66 11/30/2015 1033   ALKPHOS 92 07/18/2015 0926   BILITOT 0.4 02/07/2016 1038   BILITOT 0.31 11/30/2015 1033   BILITOT 0.3 07/18/2015 0926   BILITOT 0.5 07/07/2015 0530   BILITOT 0.3 09/20/2014 0816   PROT 7.1 02/07/2016 1038   PROT 7.5 11/30/2015 1033   PROT 6.4 07/18/2015 0926   PROT 5.1* 07/07/2015 0530   PROT 6.6 09/20/2014 0816   ALBUMIN 2.9* 02/07/2016 1038   ALBUMIN 2.7* 11/30/2015 1033   ALBUMIN 2.9* 07/18/2015 0926   ALBUMIN 2.0* 07/07/2015 0530   ALBUMIN 3.9 09/20/2014 0816    Studies/Results: Dg Cystogram  02/09/2016  CLINICAL DATA:  Cystoscopy with retrograde pyelogram. 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 EXAM: CYSTOGRAM TECHNIQUE: Three fluoroscopic images from intraoperative retrograde pyelogram are presented for interpretation. FLUOROSCOPY TIME:  45 seconds. COMPARISON:  CT of the abdomen 02/07/2016 FINDINGS: Fluoroscopic images of the abdomen and pelvis demonstrate retrograde filling of bilateral ureters which demonstrate normal caliber. Ureteral stents are seen. Filling of bilateral renal collecting systems is also noted, without evidence of hydronephrosis. IMPRESSION: Retrograde filling of normal in caliber ureters and renal collecting systems bilaterally. Ureteral stents were placed. Electronically Signed   By: Fidela Salisbury M.D.   On: 02/09/2016 13:54    Assessment & Plans: s/p  Procedure(s): OPEN SIGMOID COLECTOMY WITH REPAIR COLOVESICAL AND COLOVAGINAL FISTULA (N/A) CYSTOSCOPY WITH RETROGRADE PYELOGRAM/ BILATERAL TEMPORARY INDWELLING URETERAL STENT PLACEMENT (Bilateral)  Discontinue Foley  Discontinue rectal tube  Discontinue Vicodin  Simethicone with meals per patient request  OOB, ambulate with assist  Continue full liquid diet  Epidural for pain with oral Tylenol  Earnstine Regal, MD, [facility] Office: [place] 02/11/2016

## 2016-02-11 NOTE — Progress Notes (Signed)
Physical Therapy Treatment Patient Details Name: Destiny Velasquez MRN: [id number] DOB: 1925-09-27 Today's Date: 02/11/2016    History of Present Illness pt presents post Open Sigmoid colectomy with Repair of Colovesical and Colovaginal Fistula, and bil Temporary Uretral Stents.  pt with hx of DM, HTN, CVA, Breast CA with Mastectomy, Liver CA, and Depression.      PT Comments    Pt progressing towards physical therapy goals. Pt reports significant functional decline compared to her baseline. Although she has made gains with mobility and tolerance for functional activity compared to initial evaluation, continue to feel that SNF may be the best option for her at d/c due to increased frequency of therapy she will receive. Will continue to follow and progress as able per POC.   Follow Up Recommendations  SNF     Equipment Recommendations  None recommended by PT    Recommendations for Other Services       Precautions / Restrictions Precautions Precautions: Fall Precaution Comments: Abdominal Incisions and Epidural Restrictions Weight Bearing Restrictions: No    Mobility  Bed Mobility Overal bed mobility: Needs Assistance Bed Mobility: Supine to Sit     Supine to sit: Supervision;HOB elevated (slightly)     General bed mobility comments: Min use of bed rails. Pt was able to transition to EOB without assistance.   Transfers Overall transfer level: Needs assistance Equipment used: Rolling walker (2 wheeled) Transfers: Sit to/from Stand Sit to Stand: Min guard         General transfer comment: Pt demonstrated proper hand placement and safety awareness. Increased time to complete. No physical assist required however hands-on guarding utilized for safety.   Ambulation/Gait Ambulation/Gait assistance: Min guard;Min assist Ambulation Distance (Feet): 115 Feet Assistive device: Rolling walker (2 wheeled) Gait Pattern/deviations: Step-through pattern;Decreased stride length;Drifts  right/left;Trunk flexed Gait velocity: Decreased Gait velocity interpretation: Below normal speed for age/gender General Gait Details: Pt moving quickly at first and was able to greatly improve gait distance overall. Pt with gross L lateral lean and required cues for improved posture and to step inside the walker. Occasional assist required for walker management. Towards end of gait training pt took several short standing rest breaks due to fatigue.    Stairs            Wheelchair Mobility    Modified Rankin (Stroke Patients Only)       Balance Overall balance assessment: Needs assistance Sitting-balance support: Feet supported;No upper extremity supported Sitting balance-Leahy Scale: Fair     Standing balance support: Bilateral upper extremity supported;During functional activity Standing balance-Leahy Scale: Poor                      Cognition Arousal/Alertness: Lethargic;Suspect due to medications Behavior During Therapy: [facility] for tasks assessed/performed Overall Cognitive Status: Within Functional Limits for tasks assessed                      Exercises      General Comments        Pertinent Vitals/Pain Pain Assessment: No/denies pain    Home Living                      Prior Function            PT Goals (current goals can now be found in the care plan section) Acute Rehab PT Goals Patient Stated Goal: Go home. PT Goal Formulation: With patient Time For Goal Achievement: 02/17/16 Potential  to Achieve Goals: Good Progress towards PT goals: Progressing toward goals    Frequency  Min 3X/week    PT Plan Current plan remains appropriate    Co-evaluation             End of Session Equipment Utilized During Treatment: Gait belt Activity Tolerance: Patient limited by fatigue Patient left: in chair;with call bell/phone within reach     Time: 0748-0810 PT Time Calculation (min) (ACUTE ONLY): 22 min  Charges:  $Gait  Training: 8-22 mins                    G Codes:      Rolinda Roan 03-04-2016, 8:20 AM   Rolinda Roan, PT, DPT Acute Rehabilitation Services Pager: [phone]

## 2016-02-11 NOTE — Clinical Social Work Placement (Signed)
   CLINICAL SOCIAL WORK PLACEMENT  NOTE  Date:  02/11/2016  Patient Details  Name: Destiny Velasquez MRN: [id number] Date of Birth: 1926/06/14  Clinical Social Work is seeking post-discharge placement for this patient at the [place] level of care (*CSW will initial, date and re-position this form in  chart as items are completed):  Yes   Patient/family provided with [facility] Work Department's list of facilities offering this level of care within the geographic area requested by the patient (or if unable, by the patient's family).  Yes   Patient/family informed of their freedom to choose among providers that offer the needed level of care, that participate in Medicare, Medicaid or managed care program needed by the patient, have an available bed and are willing to accept the patient.  Yes   Patient/family informed of [facility]'s ownership interest in [facility] and [facility], as well as of the fact that they are under no obligation to receive care at these facilities.  PASRR submitted to EDS on       PASRR number received on       Existing PASRR number confirmed on 02/11/16     FL2 transmitted to all facilities in geographic area requested by pt/family on 02/11/16     FL2 transmitted to all facilities within larger geographic area on       Patient informed that his/her managed care company has contracts with or will negotiate with certain facilities, including the following:            Patient/family informed of bed offers received.  Patient chooses bed at       Physician recommends and patient chooses bed at      Patient to be transferred to   on  .  Patient to be transferred to facility by       Patient family notified on   of transfer.  Name of family member notified:        PHYSICIAN Please prepare priority discharge summary, including medications, Please prepare prescriptions, Please sign FL2     Additional Comment:     _______________________________________________ Junie Spencer, LCSW 02/11/2016, 11:11 AM

## 2016-02-11 NOTE — Clinical Social Work Note (Signed)
Clinical Social Work Assessment  Patient Details  Name: Destiny Velasquez MRN: [id number] Date of Birth: [birth year]  Date of referral:  02/11/16               Reason for consult:  Discharge Planning                Permission sought to share information with:  Case Manager, Facility Sport and exercise psychologist, Family Supports Permission granted to share information::  No  Name::        Agency::   (SNF)  Relationship::     Contact Information:     Housing/Transportation Living arrangements for the past 2 months:  [place] of Information:  Patient, Medical Team Patient Interpreter Needed:  None Criminal Activity/Legal Involvement Pertinent to Current Situation/Hospitalization:  No - Comment as needed Significant Relationships:  Adult Children Lives with:  Facility Resident Do you feel safe going back to the place where you live?  Yes Need for family participation in patient care:  Yes (Comment)  Care giving concerns: Pt does not have any care giving concerns at this time. Pt stated she wants to return to SNF at [facility].    Social Worker assessment / plan: Holiday representative met with patient to discuss CSW role with discharge planning. Pt informed of PT recommendation for SNF for short term rehab. Pt informed CSW that she is from [facility] and plans to return to this SNF when medically stable for discharge.   Employment status:  Retired Forensic scientist:  Medicare PT Recommendations:  [place] / Referral to community resources:  [place]  Patient/Family's Response to care: Pt agreeable to SNF for higher level of care. Pt also happy with care she is receiving at [facility].   Patient/Family's Understanding of and Emotional Response to Diagnosis, Current Treatment, and Prognosis:  Pt appears to understand PT recommendation for SNF. Pt also understands pt care plan.   Emotional Assessment Appearance:  Appears  stated age Attitude/Demeanor/Rapport:   (Pleasant) Affect (typically observed):  Accepting, Pleasant, Hopeful Orientation:  Oriented to Self, Oriented to Place, Oriented to  Time, Oriented to Situation Alcohol / Substance use:  Not Applicable Psych involvement (Current and /or in the community):  No (Comment)  Discharge Needs  Concerns to be addressed:  Discharge Planning Concerns Readmission within the last 30 days:  No Current discharge risk:  None Barriers to Discharge:  Continued Medical Work up   [employer], LCSW 02/11/2016, 10:51 AM

## 2016-02-12 ENCOUNTER — Encounter (HOSPITAL_COMMUNITY): Payer: Self-pay | Admitting: Anesthesiology

## 2016-02-12 ENCOUNTER — Inpatient Hospital Stay (HOSPITAL_COMMUNITY): Payer: Medicare Other

## 2016-02-12 LAB — CBC
HCT: 31 % — ABNORMAL LOW (ref 36.0–46.0)
HEMOGLOBIN: 9.8 g/dL — AB (ref 12.0–15.0)
MCH: 25.7 pg — ABNORMAL LOW (ref 26.0–34.0)
MCHC: 31.6 g/dL (ref 30.0–36.0)
MCV: 81.4 fL (ref 78.0–100.0)
Platelets: 226 10*3/uL (ref 150–400)
RBC: 3.81 MIL/uL — AB (ref 3.87–5.11)
RDW: 16.9 % — ABNORMAL HIGH (ref 11.5–15.5)
WBC: 12.3 10*3/uL — ABNORMAL HIGH (ref 4.0–10.5)

## 2016-02-12 LAB — BASIC METABOLIC PANEL
Anion gap: 7 (ref 5–15)
BUN: 6 mg/dL (ref 6–20)
CHLORIDE: 102 mmol/L (ref 101–111)
CO2: 23 mmol/L (ref 22–32)
CREATININE: 0.8 mg/dL (ref 0.44–1.00)
Calcium: 8.9 mg/dL (ref 8.9–10.3)
GFR calc Af Amer: >60 mL/min (ref >60)
GFR calc non Af Amer: >60 mL/min (ref >60)
GLUCOSE: 152 mg/dL — AB (ref 65–99)
Potassium: 4.1 mmol/L (ref 3.5–5.1)
SODIUM: 132 mmol/L — AB (ref 135–145)

## 2016-02-12 LAB — AEROBIC/ANAEROBIC CULTURE W GRAM STAIN (SURGICAL/DEEP WOUND): Culture: NO GROWTH

## 2016-02-12 LAB — AEROBIC/ANAEROBIC CULTURE (SURGICAL/DEEP WOUND)

## 2016-02-12 NOTE — Progress Notes (Signed)
Patient ID: Destiny Velasquez, female   DOB: Oct 02, 1925, 82 y.o.   MRN: [id number]  [facility]  Subjective: POD#3 - patient up in chair.  Good with IS use.  Tolerating full liquid diet.  Passing flatus.  Objective: Vital signs in last 24 hours: Temp:  [97.8 F (36.6 C)-98.7 F (37.1 C)] 98.7 F (37.1 C) (07/02 0544) Pulse Rate:  [71-77] 77 (07/02 0544) Resp:  [17-18] 18 (07/02 0544) BP: (122-132)/(46-56) 122/46 mmHg (07/02 0544) SpO2:  [97 %-99 %] 99 % (07/02 0544) Last BM Date: 02/10/16  Intake/Output from previous day: 07/01 0701 - 07/02 0700 In: 1415 [P.O.:820; I.V.:595] Out: 1250 [Urine:1250] Intake/Output this shift:    Physical Exam: HEENT - sclerae clear, mucous membranes moist Neck - soft Chest - clear bilaterally Cor - RRR Abdomen - soft, dressing intact; BS present Ext - no edema, non-tender Neuro - alert & oriented, no focal deficits  Lab Results:   Recent Labs  02/11/16 0643 02/12/16 0610  WBC 11.3* 12.3*  HGB 9.3* 9.8*  HCT 30.0* 31.0*  PLT 193 226   BMET  Recent Labs  02/11/16 0643 02/12/16 0610  NA 134* 132*  K 4.2 4.1  CL 104 102  CO2 26 23  GLUCOSE 202* 152*  BUN 10 6  CREATININE 1.01* 0.80  CALCIUM 8.5* 8.9   PT/INR  Recent Labs  02/10/16 0608  LABPROT 17.0*  INR 1.37   Comprehensive Metabolic Panel:    Component Value Date/Time   NA 132* 02/12/2016 0610   NA 134* 02/11/2016 0643   NA 138 11/30/2015 1033   NA 134* 10/27/2015   NA 133* 09/12/2015   K 4.1 02/12/2016 0610   K 4.2 02/11/2016 0643   K 3.3* 11/30/2015 1033   CL 102 02/12/2016 0610   CL 104 02/11/2016 0643   CO2 23 02/12/2016 0610   CO2 26 02/11/2016 0643   CO2 28 11/30/2015 1033   BUN 6 02/12/2016 0610   BUN 10 02/11/2016 0643   BUN 13.3 11/30/2015 1033   BUN 21 10/27/2015   BUN 16 09/12/2015   CREATININE 0.80 02/12/2016 0610   CREATININE 1.01* 02/11/2016 0643   CREATININE 1.0 11/30/2015 1033   CREATININE 1.1  10/27/2015   CREATININE 1.3* 09/12/2015   GLUCOSE 152* 02/12/2016 0610   GLUCOSE 202* 02/11/2016 0643   GLUCOSE 122 11/30/2015 1033   GLUCOSE 132* 07/18/2015 0926   GLUCOSE 119* 03/30/2015 0813   CALCIUM 8.9 02/12/2016 0610   CALCIUM 8.5* 02/11/2016 0643   CALCIUM 10.1 11/30/2015 1033   AST 15 02/07/2016 1038   AST 14 11/30/2015 1033   AST 21 07/18/2015 0926   ALT 8* 02/07/2016 1038   ALT <9 11/30/2015 1033   ALT 11 07/18/2015 0926   ALKPHOS 68 02/07/2016 1038   ALKPHOS 66 11/30/2015 1033   ALKPHOS 92 07/18/2015 0926   BILITOT 0.4 02/07/2016 1038   BILITOT 0.31 11/30/2015 1033   BILITOT 0.3 07/18/2015 0926   BILITOT 0.5 07/07/2015 0530   BILITOT 0.3 09/20/2014 0816   PROT 7.1 02/07/2016 1038   PROT 7.5 11/30/2015 1033   PROT 6.4 07/18/2015 0926   PROT 5.1* 07/07/2015 0530   PROT 6.6 09/20/2014 0816   ALBUMIN 2.9* 02/07/2016 1038   ALBUMIN 2.7* 11/30/2015 1033   ALBUMIN 2.9* 07/18/2015 0926   ALBUMIN 2.0* 07/07/2015 0530   ALBUMIN 3.9 09/20/2014 0816    Studies/Results: No results found.  Assessment & Plans: s/p Procedure(s): OPEN SIGMOID COLECTOMY WITH  REPAIR COLOVESICAL AND COLOVAGINAL FISTULA (N/A) CYSTOSCOPY WITH RETROGRADE PYELOGRAM/ BILATERAL TEMPORARY INDWELLING URETERAL STENT PLACEMENT (Bilateral) OOB, ambulate with assist Advance to regular diet Epidural for pain with oral Tylenol  WBC up slightly, pulse ox decreased on RA - will check CXR this AM  Discussed with patient and daughter  Earnstine Regal, MD, [facility] Office: [place] 02/12/2016

## 2016-02-12 NOTE — Anesthesia Post-op Follow-up Note (Signed)
  Anesthesia Pain Follow-up Note  Patient: Destiny Velasquez  Day #: 3 (post op)  Date of Follow-up: 02/12/2016 Time: 10:55 AM  Last Vitals:  Filed Vitals:   02/12/16 0544 02/12/16 0918  BP: 122/46 139/59  Pulse: 77 70  Temp: 37.1 C   Resp: 18     Level of Consciousness: alert  Pain: none   Side Effects:slight weakness in right quad, slight numbness right leg  Catheter Site Exam:clean, dry, no drainage  Epidural / Intrathecal    Start     Dose/Rate Route Frequency Ordered Stop   02/09/16 1530  ropivacaine (PF) 2 mg/ml (0.2%) (NAROPIN) epidural     6 mL/hr 6 mL/hr  Epidural Continuous 02/09/16 1511         Plan: Modify therapy to reduce side effects.  Infusion reduced from 6 to 3.  Pt reassured that weakness should resolve, but pain may increase.  Discussed with nursing.  Danyetta Gillham DANIEL

## 2016-02-12 NOTE — Clinical Social Work Note (Signed)
CSW attempted to speak with social worker at  [facility] to confirm pt returning to SNF when medically stable for discharge.

## 2016-02-13 LAB — BASIC METABOLIC PANEL
Anion gap: 17 — ABNORMAL HIGH (ref 5–15)
BUN: 6 mg/dL (ref 6–20)
CALCIUM: 10.3 mg/dL (ref 8.9–10.3)
CO2: 23 mmol/L (ref 22–32)
Chloride: 97 mmol/L — ABNORMAL LOW (ref 101–111)
Creatinine, Ser: 0.82 mg/dL (ref 0.44–1.00)
GFR calc Af Amer: >60 mL/min (ref >60)
GLUCOSE: 172 mg/dL — AB (ref 65–99)
POTASSIUM: 4 mmol/L (ref 3.5–5.1)
Sodium: 137 mmol/L (ref 135–145)

## 2016-02-13 LAB — CBC
HCT: 33.7 % — ABNORMAL LOW (ref 36.0–46.0)
Hemoglobin: 10.5 g/dL — ABNORMAL LOW (ref 12.0–15.0)
MCH: 26 pg (ref 26.0–34.0)
MCHC: 31.2 g/dL (ref 30.0–36.0)
MCV: 83.4 fL (ref 78.0–100.0)
PLATELETS: 278 10*3/uL (ref 150–400)
RBC: 4.04 MIL/uL (ref 3.87–5.11)
RDW: 16.8 % — AB (ref 11.5–15.5)
WBC: 15.7 10*3/uL — ABNORMAL HIGH (ref 4.0–10.5)

## 2016-02-13 LAB — URINALYSIS, ROUTINE W REFLEX MICROSCOPIC
BILIRUBIN URINE: NEGATIVE
GLUCOSE, UA: NEGATIVE mg/dL
Hgb urine dipstick: NEGATIVE
KETONES UR: NEGATIVE mg/dL
Leukocytes, UA: NEGATIVE
Nitrite: NEGATIVE
PH: 6.5 (ref 5.0–8.0)
Protein, ur: NEGATIVE mg/dL
SPECIFIC GRAVITY, URINE: 1.01 (ref 1.005–1.030)

## 2016-02-13 MED ORDER — MORPHINE SULFATE (PF) 2 MG/ML IV SOLN: 2.0000 mg | INTRAVENOUS | Status: DC | PRN

## 2016-02-13 MED ORDER — HYDROCORTISONE 2.5 % RE CREA: 1.0000 "application " | TOPICAL_CREAM | Freq: Four times a day (QID) | RECTAL | Status: DC | PRN

## 2016-02-13 MED ORDER — MORPHINE SULFATE (PF) 2 MG/ML IV SOLN
2.0000 mg | INTRAVENOUS | Status: DC | PRN
  Administered 2016-02-13: 2 mg via INTRAVENOUS
  Filled 2016-02-13: qty 1

## 2016-02-13 MED ORDER — HYDROCODONE-ACETAMINOPHEN 5-325 MG PO TABS
1.0000 | ORAL_TABLET | Freq: Four times a day (QID) | ORAL | Status: DC | PRN
  Administered 2016-02-13 – 2016-02-14 (×4): 2 via ORAL
  Filled 2016-02-13 (×4): qty 2

## 2016-02-13 MED ORDER — HYDROCODONE-ACETAMINOPHEN 5-325 MG PO TABS: 1.0000 | ORAL_TABLET | Freq: Four times a day (QID) | ORAL | Status: DC | PRN

## 2016-02-13 NOTE — Anesthesia Post-op Follow-up Note (Signed)
  Anesthesia Pain Follow-up Note  Patient: [name]  Day #: 4  Date of Follow-up: 02/13/2016 Time: 8:40 AM  Pt had epidural cath become disconnected this morning and has been uncomfortable since. Morphine started by surgical team. I discussed with Dr. Barry Dienes that today would be the day to remove the catheter anyway, so I would just pull it out.   Last Vitals:  Filed Vitals:   02/12/16 2103 02/13/16 0551  BP:  146/69  Pulse:  69  Temp:  36.8 C  Resp: 18     Level of Consciousness: alert  Pain: moderate   Side Effects:None  Catheter Site Exam:clean, dry, no drainage  Epidural / Intrathecal    Start     Dose/Rate Route Frequency Ordered Stop   02/09/16 1530  ropivacaine (PF) 2 mg/ml (0.2%) (NAROPIN) epidural     6 mL/hr 6 mL/hr  Epidural Continuous 02/09/16 1511         Plan: Catheter removed/tip intact  Saylee Sherrill,W. EDMOND

## 2016-02-13 NOTE — Care Management Important Message (Signed)
Important Message  Patient Details  Name: Destiny Velasquez MRN: [id number] Date of Birth: August 02, 1926   Medicare Important Message Given:  Yes    Lucion Dilger Abena 02/13/2016, 12:03 PM

## 2016-02-13 NOTE — Progress Notes (Signed)
Patient ID: Destiny Velasquez, female   DOB: 20-Mar-1926, 82 y.o.   MRN: [id number]  [facility]  Subjective: POD#4 -regular diet started.  Epidural came disconnected.    Objective: Vital signs in last 24 hours: Temp:  [97.8 F (36.6 C)-98.4 F (36.9 C)] 98.3 F (36.8 C) (07/03 0551) Pulse Rate:  [69-70] 69 (07/03 0551) Resp:  [18] 18 (07/02 2103) BP: (139-146)/(59-83) 146/69 mmHg (07/03 0551) SpO2:  [96 %-99 %] 96 % (07/03 0551) Last BM Date: 02/10/16  Intake/Output from previous day: 07/02 0701 - 07/03 0700 In: 650 [I.V.:650] Out: 2400 [Urine:2400] Intake/Output this shift: Total I/O In: -  Out: 350 [Urine:350]  Physical Exam: HEENT - sclerae clear, mucous membranes moist Neck - soft Chest -breathing comfortably Abdomen - soft, dressing intact; BS present Ext - no edema, non-tender Neuro - alert & oriented, no focal deficits  Lab Results:   Recent Labs  02/12/16 0610 02/13/16 0430  WBC 12.3* 15.7*  HGB 9.8* 10.5*  HCT 31.0* 33.7*  PLT 226 278   BMET  Recent Labs  02/12/16 0610 02/13/16 0430  NA 132* 137  K 4.1 4.0  CL 102 97*  CO2 23 23  GLUCOSE 152* 172*  BUN 6 6  CREATININE 0.80 0.82  CALCIUM 8.9 10.3   PT/INR No results for input(s): LABPROT, INR in the last 72 hours. Comprehensive Metabolic Panel:    Component Value Date/Time   NA 137 02/13/2016 0430   NA 132* 02/12/2016 0610   NA 138 11/30/2015 1033   NA 134* 10/27/2015   NA 133* 09/12/2015   K 4.0 02/13/2016 0430   K 4.1 02/12/2016 0610   K 3.3* 11/30/2015 1033   CL 97* 02/13/2016 0430   CL 102 02/12/2016 0610   CO2 23 02/13/2016 0430   CO2 23 02/12/2016 0610   CO2 28 11/30/2015 1033   BUN 6 02/13/2016 0430   BUN 6 02/12/2016 0610   BUN 13.3 11/30/2015 1033   BUN 21 10/27/2015   BUN 16 09/12/2015   CREATININE 0.82 02/13/2016 0430   CREATININE 0.80 02/12/2016 0610   CREATININE 1.0 11/30/2015 1033   CREATININE 1.1 10/27/2015   CREATININE 1.3*  09/12/2015   GLUCOSE 172* 02/13/2016 0430   GLUCOSE 152* 02/12/2016 0610   GLUCOSE 122 11/30/2015 1033   GLUCOSE 132* 07/18/2015 0926   GLUCOSE 119* 03/30/2015 0813   CALCIUM 10.3 02/13/2016 0430   CALCIUM 8.9 02/12/2016 0610   CALCIUM 10.1 11/30/2015 1033   AST 15 02/07/2016 1038   AST 14 11/30/2015 1033   AST 21 07/18/2015 0926   ALT 8* 02/07/2016 1038   ALT <9 11/30/2015 1033   ALT 11 07/18/2015 0926   ALKPHOS 68 02/07/2016 1038   ALKPHOS 66 11/30/2015 1033   ALKPHOS 92 07/18/2015 0926   BILITOT 0.4 02/07/2016 1038   BILITOT 0.31 11/30/2015 1033   BILITOT 0.3 07/18/2015 0926   BILITOT 0.5 07/07/2015 0530   BILITOT 0.3 09/20/2014 0816   PROT 7.1 02/07/2016 1038   PROT 7.5 11/30/2015 1033   PROT 6.4 07/18/2015 0926   PROT 5.1* 07/07/2015 0530   PROT 6.6 09/20/2014 0816   ALBUMIN 2.9* 02/07/2016 1038   ALBUMIN 2.7* 11/30/2015 1033   ALBUMIN 2.9* 07/18/2015 0926   ALBUMIN 2.0* 07/07/2015 0530   ALBUMIN 3.9 09/20/2014 0816    Studies/Results: Dg Chest Port 1 View  02/12/2016  CLINICAL DATA:  Leukocytosis EXAM: PORTABLE CHEST 1 VIEW COMPARISON:  07/02/2015 FINDINGS: Cardiac shadow is  mildly enlarged. The lungs are well aerated bilaterally. No focal infiltrate or sizable effusion is seen. No bony abnormality is noted. IMPRESSION: No active disease. Electronically Signed   By: Inez Catalina M.D.   On: 02/12/2016 10:29    Assessment & Plans: s/p Procedure(s): OPEN SIGMOID COLECTOMY  CYSTOSCOPY WITH RETROGRADE PYELOGRAM/ BILATERAL TEMPORARY INDWELLING URETERAL STENT PLACEMENT (Bilateral) OOB, ambulate with assist  regular diet Pain control  WBC up again.  Check u/a.  Chest xray ok.     [facility] Office: [place] 02/13/2016

## 2016-02-13 NOTE — Discharge Instructions (Signed)
[facility]      [facility] °[phone] ° °ABDOMINAL SURGERY: POST OP INSTRUCTIONS ° °Always review your discharge instruction sheet given to you by the facility where your surgery was performed. ° °IF YOU HAVE DISABILITY OR FAMILY LEAVE FORMS, YOU MUST BRING THEM TO THE OFFICE FOR PROCESSING.  PLEASE DO NOT GIVE THEM TO YOUR DOCTOR. ° °1. A prescription for pain medication may be given to you upon discharge.  Take your pain medication as prescribed, if needed.  If narcotic pain medicine is not needed, then you may take acetaminophen (Tylenol) or ibuprofen (Advil) as needed. °2. Take your usually prescribed medications unless otherwise directed. °3. If you need a refill on your pain medication, please contact your pharmacy. They will contact our office to request authorization.  Prescriptions will not be filled after 5pm or on week-ends. °4. You should follow a light diet the first few days after arrival home, such as soup and crackers, pudding, etc.unless your doctor has advised otherwise. A high-fiber, low fat diet can be resumed as tolerated.   Be sure to include lots of fluids daily. Most patients will experience some swelling and bruising on the chest and neck area.  Ice packs will help.  Swelling and bruising can take several days to resolve °5. Most patients will experience some swelling and bruising in the area of the incision. Ice pack will help. Swelling and bruising can take several days to resolve..  °6. It is common to experience some constipation if taking pain medication after surgery.  Increasing fluid intake and taking a stool softener will usually help or prevent this problem from occurring.  A mild laxative (Milk of Magnesia or Miralax) should be taken according to package directions if there are no bowel movements after 48 hours. °7.  You may have steri-strips (small skin tapes) in place directly over the incision.  These strips should be left on the skin for 10-14 days.  If your  surgeon used skin glue on the incision, you may shower in 48 hours.  The glue will flake off over the next 2-3 weeks.  Any sutures or staples will be removed at the office during your follow-up visit. You may find that a light gauze bandage over your incision may keep your staples from being rubbed or pulled. You may shower and replace the bandage daily. °8. ACTIVITIES:  You may resume regular (light) daily activities beginning the next day--such as daily self-care, walking, climbing stairs--gradually increasing activities as tolerated.  You may have sexual intercourse when it is comfortable.  Refrain from any heavy lifting or straining until approved by your doctor. °a. You may drive when you no longer are taking prescription pain medication, you can comfortably wear a seatbelt, and you can safely maneuver your car and apply brakes °b. Return to Work: __________8 weeks if applicable_________________________ °9. You should see your doctor in the office for a follow-up appointment approximately two weeks after your surgery.  Make sure that you call for this appointment within a day or two after you arrive home to insure a convenient appointment time. °OTHER INSTRUCTIONS:  °_____________________________________________________________ °_____________________________________________________________ ° °WHEN TO CALL YOUR DOCTOR: °1. Fever over 101.0 °2. Inability to urinate °3. Nausea and/or vomiting °4. Extreme swelling or bruising °5. Continued bleeding from incision. °6. Increased pain, redness, or drainage from the incision. °7. Difficulty swallowing or breathing °8. Muscle cramping or spasms. °9. Numbness or tingling in hands or feet or around lips. ° °The clinic staff is   available to answer your questions during regular business hours.  Please don’t hesitate to call and ask to speak to one of the nurses if you have concerns. ° °For further questions, please visit [email or web address] ° ° ° °

## 2016-02-13 NOTE — Discharge Summary (Signed)
Physician Discharge Summary  Patient ID: Destiny Velasquez MRN: [id number] DOB/AGE: [birth year] 82 y.o.  Admit date: 02/09/2016 Discharge date: 02/14/2016  Admission Diagnoses:  Principal dx is diverticulitis with chronic stricture  Patient Active Problem List   Diagnosis Date Noted  . Diverticulitis 02/09/2016  . Loss of weight 10/25/2015  . Thyroid activity decreased 10/25/2015  . Mild cognitive impairment with memory loss 10/25/2015  . Controlled type 2 diabetes mellitus without complication, without long-term current use of insulin ([place]) 10/25/2015  . Biloma following surgery   . Abdominal abscess ([place])   . Abscess   . Pyelonephritis 07/02/2015  . Dysphagia 07/02/2015  . Leukocytosis 07/02/2015  . Acute-on-chronic kidney injury ([place]) 07/02/2015  . Hyponatremia 07/02/2015  . Urinary tract infection, site not specified 07/02/2015  . Subacute delirium   . GERD (gastroesophageal reflux disease)   . Adenocarcinoma of liver s/p partial right hepatic lobectomy 06/14/2015 06/14/2015  . At risk for colon cancer 05/17/2015  . Unintentional weight loss 05/06/2015  . Hypothyroidism 05/03/2015  . Essential hypertension 05/03/2015  . Chronic constipation 04/15/2014  . Arthralgia of right knee 01/26/2013  . Arthralgia of right lower leg 01/26/2013  . Hyperlipidemia   . Thyroid disease   . Memory loss   . Disorder of bone and cartilage, unspecified     Discharge Diagnoses:  Active Problems:   Diverticulitis Deconditioning Malnutrition  Discharged Condition: stable  Hospital Course:  The patient was admitted to the floor following an open sigmoid colectomy with reanastomosis.  She had an epidural for pain control which worked very well.  She originally had postobstructive loose stools.  These resolved.  She had her epidural become disconnected on postop day 4.  This was the day was to be removed so as not really started.  She was able to advance her diet to a regular diet.  She was working  with physical therapy.  She was able to void with the Foley removed.  She is still frail but making improvements. On POD#4 her white blood cell count went up a small amount, urinalysis with signs of possible UTI in a patient with PMH UTI's after foley catheter placement. She was placed on oral abx. She had no drainage or erythema from her wound.  She is stable for discharge to skilled nursing facility.  Consults: ANESTHESIA  Significant Diagnostic Studies: labs: WBCs 15.7, HCT 33.7, Cr 0.82  Treatments: surgery: see above  Discharge Exam: Blood pressure 182/62, pulse 61, temperature 97.8 F (36.6 C), temperature source Oral, resp. rate 16, height '5\' 3"'$  (1.6 m), weight 55.792 kg (123 lb), SpO2 99 %. General appearance: alert, cooperative and mild distress Resp: Breathing comfortably GI: soft< non distended< non tender Extremities: extremities normal, atraumatic, no cyanosis or edema  Disposition: 01-Home or Self Care     Medication List    STOP taking these medications        amoxicillin-clavulanate 875-125 MG tablet  Commonly known as:  AUGMENTIN     metroNIDAZOLE 500 MG tablet  Commonly known as:  FLAGYL      TAKE these medications        acetaminophen 325 MG tablet  Commonly known as:  TYLENOL  Take 650 mg by mouth every 6 (six) hours as needed (pain).     amLODipine 5 MG tablet  Commonly known as:  NORVASC  Take 5 mg by mouth daily.     aspirin 81 MG tablet  Take 81 mg by mouth daily.     bisacodyl 5  MG EC tablet  Commonly known as:  DULCOLAX  Take 5 mg by mouth once. Took 4 times for bowel prep     buPROPion 150 MG 24 hr tablet  Commonly known as:  WELLBUTRIN XL  Take 150 mg by mouth daily.     feeding supplement Liqd  Take 1 Container by mouth 2 (two) times daily between meals.     HYDROcodone-acetaminophen 5-325 MG tablet  Commonly known as:  NORCO/VICODIN  Take 1-2 tablets by mouth every 6 (six) hours as needed for moderate pain.     hydrocortisone  2.5 % rectal cream  Commonly known as:  ANUSOL-HC  Apply 1 application topically 4 (four) times daily as needed for hemorrhoids.     levothyroxine 100 MCG tablet  Commonly known as:  SYNTHROID, LEVOTHROID  Take 100 mcg by mouth daily before breakfast.     LORazepam 0.5 MG tablet  Commonly known as:  ATIVAN  Take 0.5 mg by mouth 2 (two) times daily as needed for anxiety.     nitrofurantoin (macrocrystal-monohydrate) 100 MG capsule  Commonly known as:  MACROBID  Take 1 capsule (100 mg total) by mouth 2 (two) times daily.     ondansetron 4 MG tablet  Commonly known as:  ZOFRAN  Take 4 mg by mouth daily as needed for nausea or vomiting.     pantoprazole 40 MG tablet  Commonly known as:  PROTONIX  Take 40 mg by mouth 2 (two) times daily before a meal.     polyethylene glycol packet  Commonly known as:  MIRALAX / GLYCOLAX  Take 17 g by mouth daily.     PROBIOTIC DAILY PO  Take 1 tablet by mouth daily.     ranitidine 150 MG capsule  Commonly known as:  ZANTAC  Take 150 mg by mouth daily as needed (indigestion).     saccharomyces boulardii 250 MG capsule  Commonly known as:  FLORASTOR  Take 250 mg by mouth 2 (two) times daily.     simethicone 125 MG chewable tablet  Commonly known as:  MYLICON  Chew 149 mg by mouth 3 (three) times daily before meals.     UNABLE TO FIND  Med Name: neomycin        Signed: Obie Dredge, [facility] Pager: [phone] Consults: [phone] Mon-Fri 7:00 am-4:30 pm Sat-Sun 7:00 am-11:30 am

## 2016-02-13 NOTE — Progress Notes (Signed)
Patient's epidural catheter noted to be leaking and pulled out from hub connecting to medicine, pt in a lot of pain, hydrocodone 5/325 1 po given. Notified on call anesthesiologist for leaking epidural catheter and ordered to address this to on call doctor .Notified Dr. Grandville Silos and  will talk to anesthesiologist. New order for morphine prn IV until epidural restarted.

## 2016-02-13 NOTE — Progress Notes (Signed)
Physical Therapy Treatment Patient Details Name: Destiny Velasquez MRN: [id number] DOB: [birth year] Today's Date: 02/13/2016    History of Present Illness pt presents post Open Sigmoid colectomy with Repair of Colovesical and Colovaginal Fistula, and bil Temporary Uretral Stents.  pt with hx of DM, HTN, CVA, Breast CA with Mastectomy, Liver CA, and Depression.      PT Comments    Pt received in bed, awake and ready for treatment session. Patient reported increased pain in bilateral abdomen secondary to d/c of epidural. However, during ambulation patient did not report an increase in pain level from 3/10 at beginning of session. Patient continues to make good progress towards goals, and would benefit from d/c to SNF with continued therapy services.  Follow Up Recommendations  SNF     Equipment Recommendations  None recommended by PT    Recommendations for Other Services       Precautions / Restrictions Precautions Precautions: Fall Precaution Comments: Abdominal Incisions Restrictions Weight Bearing Restrictions: No    Mobility  Bed Mobility Overal bed mobility: Needs Assistance Bed Mobility: Rolling;Sidelying to Sit Rolling: Supervision Sidelying to sit: Min guard       General bed mobility comments: Pt utilized bed rails with HOB elevated and required increased time.  Transfers Overall transfer level: Needs assistance Equipment used: Rolling walker (2 wheeled) (gait belt) Transfers: Sit to/from Stand Sit to Stand: Min guard         General transfer comment: Pt required verbal cues to utilize upper extremities on bed to assist with transfer. She also required increased time to perform transfer with min guard for safety.  Ambulation/Gait Ambulation/Gait assistance: Min guard (with gait belt) Ambulation Distance (Feet): 200 Feet Assistive device: Rolling walker (2 wheeled) Gait Pattern/deviations: Decreased step length - right;Decreased step length - left;Step-through  pattern;Trunk flexed Gait velocity: Decreased Gait velocity interpretation: Below normal speed for age/gender     Stairs            Wheelchair Mobility    Modified Rankin (Stroke Patients Only)       Balance Overall balance assessment: Needs assistance Sitting-balance support: Feet supported;Bilateral upper extremity supported Sitting balance-Leahy Scale: Fair     Standing balance support: Single extremity supported Standing balance-Leahy Scale: Poor                      Cognition Arousal/Alertness: Awake/alert Behavior During Therapy: WFL for tasks assessed/performed Overall Cognitive Status: Within Functional Limits for tasks assessed                      Exercises      General Comments        Pertinent Vitals/Pain Pain Assessment: 0-10 Pain Score: 3  Pain Location: abdomen, bilaterally Pain Descriptors / Indicators: Discomfort;Grimacing Pain Intervention(s): Limited activity within patient's tolerance;Monitored during session    Home Living                      Prior Function            PT Goals (current goals can now be found in the care plan section) Acute Rehab PT Goals Patient Stated Goal: Go home. PT Goal Formulation: With patient Time For Goal Achievement: 02/17/16 Potential to Achieve Goals: Good Progress towards PT goals: Progressing toward goals    Frequency  Min 3X/week    PT Plan Current plan remains appropriate    Co-evaluation  End of Session Equipment Utilized During Treatment: Gait belt Activity Tolerance: Patient limited by fatigue Patient left: in chair;with call bell/phone within reach;with nursing/sitter in room     Time: 1322-1341 PT Time Calculation (min) (ACUTE ONLY): 19 min  Charges:  $Gait Training: 8-22 mins                    G CodesClearnce Sorrel Zebadiah Velasquez 02-24-16, 2:02 PM

## 2016-02-14 DIAGNOSIS — E559 Vitamin D deficiency, unspecified: Secondary | ICD-10-CM | POA: Diagnosis not present

## 2016-02-14 DIAGNOSIS — K5720 Diverticulitis of large intestine with perforation and abscess without bleeding: Secondary | ICD-10-CM | POA: Diagnosis not present

## 2016-02-14 DIAGNOSIS — M6281 Muscle weakness (generalized): Secondary | ICD-10-CM | POA: Diagnosis not present

## 2016-02-14 DIAGNOSIS — N189 Chronic kidney disease, unspecified: Secondary | ICD-10-CM | POA: Diagnosis not present

## 2016-02-14 DIAGNOSIS — R2681 Unsteadiness on feet: Secondary | ICD-10-CM | POA: Diagnosis not present

## 2016-02-14 DIAGNOSIS — R293 Abnormal posture: Secondary | ICD-10-CM | POA: Diagnosis not present

## 2016-02-14 DIAGNOSIS — K632 Fistula of intestine: Secondary | ICD-10-CM | POA: Diagnosis not present

## 2016-02-14 DIAGNOSIS — K5792 Diverticulitis of intestine, part unspecified, without perforation or abscess without bleeding: Secondary | ICD-10-CM | POA: Diagnosis not present

## 2016-02-14 DIAGNOSIS — Z48815 Encounter for surgical aftercare following surgery on the digestive system: Secondary | ICD-10-CM | POA: Diagnosis not present

## 2016-02-14 DIAGNOSIS — C229 Malignant neoplasm of liver, not specified as primary or secondary: Secondary | ICD-10-CM | POA: Diagnosis not present

## 2016-02-14 DIAGNOSIS — E039 Hypothyroidism, unspecified: Secondary | ICD-10-CM | POA: Diagnosis not present

## 2016-02-14 DIAGNOSIS — I129 Hypertensive chronic kidney disease with stage 1 through stage 4 chronic kidney disease, or unspecified chronic kidney disease: Secondary | ICD-10-CM | POA: Diagnosis not present

## 2016-02-14 DIAGNOSIS — E785 Hyperlipidemia, unspecified: Secondary | ICD-10-CM | POA: Diagnosis not present

## 2016-02-14 DIAGNOSIS — E119 Type 2 diabetes mellitus without complications: Secondary | ICD-10-CM | POA: Diagnosis not present

## 2016-02-14 LAB — BASIC METABOLIC PANEL
ANION GAP: 6 (ref 5–15)
BUN: 6 mg/dL (ref 6–20)
CO2: 24 mmol/L (ref 22–32)
Calcium: 9.1 mg/dL (ref 8.9–10.3)
Chloride: 103 mmol/L (ref 101–111)
Creatinine, Ser: 0.9 mg/dL (ref 0.44–1.00)
GFR calc non Af Amer: 56 mL/min — ABNORMAL LOW (ref >60)
Glucose, Bld: 106 mg/dL — ABNORMAL HIGH (ref 65–99)
Potassium: 3.7 mmol/L (ref 3.5–5.1)
SODIUM: 133 mmol/L — AB (ref 135–145)

## 2016-02-14 LAB — CBC
HEMATOCRIT: 34.4 % — AB (ref 36.0–46.0)
HEMOGLOBIN: 11.1 g/dL — AB (ref 12.0–15.0)
MCH: 26 pg (ref 26.0–34.0)
MCHC: 32.3 g/dL (ref 30.0–36.0)
MCV: 80.6 fL (ref 78.0–100.0)
Platelets: 286 10*3/uL (ref 150–400)
RBC: 4.27 MIL/uL (ref 3.87–5.11)
RDW: 16.9 % — AB (ref 11.5–15.5)
WBC: 14.7 10*3/uL — AB (ref 4.0–10.5)

## 2016-02-14 MED ORDER — NITROFURANTOIN MONOHYD MACRO 100 MG PO CAPS: 100.0000 mg | ORAL_CAPSULE | Freq: Two times a day (BID) | ORAL | Status: AC

## 2016-02-14 NOTE — Progress Notes (Signed)
Pt discharged to [facility].  Discharge instructions explained to pt and daughter.  They have no questions at the time of discharge.  Pt states she has all belongings.  IV removed.  Pt taken off unit via volunteer services by wheelchair.  Pt to be transported by daughter in private vehicle to [facility].   Report called and given to Juliann Pulse, Therapist, sports.

## 2016-02-14 NOTE — Clinical Social Work Note (Signed)
CSW called Admission Coordinator Candice. She reported patient can return to [facility] for [employer].   Per MD patient is ready to discharge to [facility]. RN, patient, patient's family, and facility notified of discharge. RN given phone number for report and transport packet is on patient's chart. Ambulance transport requested.  Please call report to [phone].   CSW signing off.  [employer], LCSW [phone]

## 2016-02-15 DIAGNOSIS — K5720 Diverticulitis of large intestine with perforation and abscess without bleeding: Secondary | ICD-10-CM | POA: Diagnosis not present

## 2016-02-15 DIAGNOSIS — K632 Fistula of intestine: Secondary | ICD-10-CM | POA: Diagnosis not present

## 2016-02-15 DIAGNOSIS — E039 Hypothyroidism, unspecified: Secondary | ICD-10-CM | POA: Diagnosis not present

## 2016-02-15 DIAGNOSIS — Z48815 Encounter for surgical aftercare following surgery on the digestive system: Secondary | ICD-10-CM | POA: Diagnosis not present

## 2016-02-17 DIAGNOSIS — E559 Vitamin D deficiency, unspecified: Secondary | ICD-10-CM | POA: Diagnosis not present

## 2016-02-17 DIAGNOSIS — E039 Hypothyroidism, unspecified: Secondary | ICD-10-CM | POA: Diagnosis not present

## 2016-02-21 DIAGNOSIS — N390 Urinary tract infection, site not specified: Secondary | ICD-10-CM | POA: Diagnosis not present

## 2016-02-24 DIAGNOSIS — R2681 Unsteadiness on feet: Secondary | ICD-10-CM | POA: Diagnosis not present

## 2016-02-24 DIAGNOSIS — R278 Other lack of coordination: Secondary | ICD-10-CM | POA: Diagnosis not present

## 2016-02-24 DIAGNOSIS — Z48815 Encounter for surgical aftercare following surgery on the digestive system: Secondary | ICD-10-CM | POA: Diagnosis not present

## 2016-02-24 DIAGNOSIS — K5792 Diverticulitis of intestine, part unspecified, without perforation or abscess without bleeding: Secondary | ICD-10-CM | POA: Diagnosis not present

## 2016-02-28 DIAGNOSIS — R2681 Unsteadiness on feet: Secondary | ICD-10-CM | POA: Diagnosis not present

## 2016-02-28 DIAGNOSIS — K5792 Diverticulitis of intestine, part unspecified, without perforation or abscess without bleeding: Secondary | ICD-10-CM | POA: Diagnosis not present

## 2016-02-28 DIAGNOSIS — R278 Other lack of coordination: Secondary | ICD-10-CM | POA: Diagnosis not present

## 2016-02-28 DIAGNOSIS — Z48815 Encounter for surgical aftercare following surgery on the digestive system: Secondary | ICD-10-CM | POA: Diagnosis not present

## 2016-03-02 DIAGNOSIS — R278 Other lack of coordination: Secondary | ICD-10-CM | POA: Diagnosis not present

## 2016-03-02 DIAGNOSIS — K5792 Diverticulitis of intestine, part unspecified, without perforation or abscess without bleeding: Secondary | ICD-10-CM | POA: Diagnosis not present

## 2016-03-02 DIAGNOSIS — R2681 Unsteadiness on feet: Secondary | ICD-10-CM | POA: Diagnosis not present

## 2016-03-02 DIAGNOSIS — Z48815 Encounter for surgical aftercare following surgery on the digestive system: Secondary | ICD-10-CM | POA: Diagnosis not present

## 2016-03-07 DIAGNOSIS — R2681 Unsteadiness on feet: Secondary | ICD-10-CM | POA: Diagnosis not present

## 2016-03-07 DIAGNOSIS — Z48815 Encounter for surgical aftercare following surgery on the digestive system: Secondary | ICD-10-CM | POA: Diagnosis not present

## 2016-03-07 DIAGNOSIS — R278 Other lack of coordination: Secondary | ICD-10-CM | POA: Diagnosis not present

## 2016-03-07 DIAGNOSIS — K5792 Diverticulitis of intestine, part unspecified, without perforation or abscess without bleeding: Secondary | ICD-10-CM | POA: Diagnosis not present

## 2016-03-09 DIAGNOSIS — R2681 Unsteadiness on feet: Secondary | ICD-10-CM | POA: Diagnosis not present

## 2016-03-09 DIAGNOSIS — Z48815 Encounter for surgical aftercare following surgery on the digestive system: Secondary | ICD-10-CM | POA: Diagnosis not present

## 2016-03-09 DIAGNOSIS — R278 Other lack of coordination: Secondary | ICD-10-CM | POA: Diagnosis not present

## 2016-03-09 DIAGNOSIS — K5792 Diverticulitis of intestine, part unspecified, without perforation or abscess without bleeding: Secondary | ICD-10-CM | POA: Diagnosis not present

## 2016-03-11 DIAGNOSIS — N390 Urinary tract infection, site not specified: Secondary | ICD-10-CM | POA: Diagnosis not present

## 2016-03-14 DIAGNOSIS — Z48815 Encounter for surgical aftercare following surgery on the digestive system: Secondary | ICD-10-CM | POA: Diagnosis not present

## 2016-03-14 DIAGNOSIS — K5792 Diverticulitis of intestine, part unspecified, without perforation or abscess without bleeding: Secondary | ICD-10-CM | POA: Diagnosis not present

## 2016-03-14 DIAGNOSIS — R278 Other lack of coordination: Secondary | ICD-10-CM | POA: Diagnosis not present

## 2016-03-14 DIAGNOSIS — R2681 Unsteadiness on feet: Secondary | ICD-10-CM | POA: Diagnosis not present

## 2016-03-16 DIAGNOSIS — Z48815 Encounter for surgical aftercare following surgery on the digestive system: Secondary | ICD-10-CM | POA: Diagnosis not present

## 2016-03-16 DIAGNOSIS — K5792 Diverticulitis of intestine, part unspecified, without perforation or abscess without bleeding: Secondary | ICD-10-CM | POA: Diagnosis not present

## 2016-03-16 DIAGNOSIS — R278 Other lack of coordination: Secondary | ICD-10-CM | POA: Diagnosis not present

## 2016-03-16 DIAGNOSIS — R2681 Unsteadiness on feet: Secondary | ICD-10-CM | POA: Diagnosis not present

## 2016-03-20 DIAGNOSIS — R2681 Unsteadiness on feet: Secondary | ICD-10-CM | POA: Diagnosis not present

## 2016-03-20 DIAGNOSIS — K5792 Diverticulitis of intestine, part unspecified, without perforation or abscess without bleeding: Secondary | ICD-10-CM | POA: Diagnosis not present

## 2016-03-20 DIAGNOSIS — Z48815 Encounter for surgical aftercare following surgery on the digestive system: Secondary | ICD-10-CM | POA: Diagnosis not present

## 2016-03-20 DIAGNOSIS — R278 Other lack of coordination: Secondary | ICD-10-CM | POA: Diagnosis not present

## 2016-03-21 DIAGNOSIS — K5792 Diverticulitis of intestine, part unspecified, without perforation or abscess without bleeding: Secondary | ICD-10-CM | POA: Diagnosis not present

## 2016-03-21 DIAGNOSIS — R2681 Unsteadiness on feet: Secondary | ICD-10-CM | POA: Diagnosis not present

## 2016-03-21 DIAGNOSIS — R278 Other lack of coordination: Secondary | ICD-10-CM | POA: Diagnosis not present

## 2016-03-21 DIAGNOSIS — Z48815 Encounter for surgical aftercare following surgery on the digestive system: Secondary | ICD-10-CM | POA: Diagnosis not present

## 2016-04-03 DIAGNOSIS — N390 Urinary tract infection, site not specified: Secondary | ICD-10-CM | POA: Diagnosis not present

## 2016-04-20 DIAGNOSIS — G3184 Mild cognitive impairment, so stated: Secondary | ICD-10-CM | POA: Diagnosis not present

## 2016-04-20 DIAGNOSIS — R2681 Unsteadiness on feet: Secondary | ICD-10-CM | POA: Diagnosis not present

## 2016-04-20 DIAGNOSIS — R262 Difficulty in walking, not elsewhere classified: Secondary | ICD-10-CM | POA: Diagnosis not present

## 2016-04-23 DIAGNOSIS — R262 Difficulty in walking, not elsewhere classified: Secondary | ICD-10-CM | POA: Diagnosis not present

## 2016-04-23 DIAGNOSIS — R2681 Unsteadiness on feet: Secondary | ICD-10-CM | POA: Diagnosis not present

## 2016-04-23 DIAGNOSIS — G3184 Mild cognitive impairment, so stated: Secondary | ICD-10-CM | POA: Diagnosis not present

## 2016-05-01 DIAGNOSIS — R351 Nocturia: Secondary | ICD-10-CM | POA: Diagnosis not present

## 2016-05-01 DIAGNOSIS — R8271 Bacteriuria: Secondary | ICD-10-CM | POA: Diagnosis not present

## 2016-05-01 DIAGNOSIS — R350 Frequency of micturition: Secondary | ICD-10-CM | POA: Diagnosis not present

## 2016-05-16 DIAGNOSIS — E785 Hyperlipidemia, unspecified: Secondary | ICD-10-CM | POA: Diagnosis not present

## 2016-05-16 DIAGNOSIS — C229 Malignant neoplasm of liver, not specified as primary or secondary: Secondary | ICD-10-CM | POA: Diagnosis not present

## 2016-05-16 DIAGNOSIS — E46 Unspecified protein-calorie malnutrition: Secondary | ICD-10-CM | POA: Diagnosis not present

## 2016-05-16 DIAGNOSIS — E039 Hypothyroidism, unspecified: Secondary | ICD-10-CM | POA: Diagnosis not present

## 2016-05-16 DIAGNOSIS — I129 Hypertensive chronic kidney disease with stage 1 through stage 4 chronic kidney disease, or unspecified chronic kidney disease: Secondary | ICD-10-CM | POA: Diagnosis not present

## 2016-05-16 DIAGNOSIS — E559 Vitamin D deficiency, unspecified: Secondary | ICD-10-CM | POA: Diagnosis not present

## 2016-05-16 DIAGNOSIS — E119 Type 2 diabetes mellitus without complications: Secondary | ICD-10-CM | POA: Diagnosis not present

## 2016-05-31 ENCOUNTER — Other Ambulatory Visit (HOSPITAL_BASED_OUTPATIENT_CLINIC_OR_DEPARTMENT_OTHER): Payer: Medicare Other

## 2016-05-31 ENCOUNTER — Encounter: Payer: Self-pay | Admitting: Hematology and Oncology

## 2016-05-31 ENCOUNTER — Ambulatory Visit (HOSPITAL_BASED_OUTPATIENT_CLINIC_OR_DEPARTMENT_OTHER): Payer: Medicare Other | Admitting: Hematology and Oncology

## 2016-05-31 DIAGNOSIS — E785 Hyperlipidemia, unspecified: Secondary | ICD-10-CM | POA: Diagnosis not present

## 2016-05-31 DIAGNOSIS — C249 Malignant neoplasm of biliary tract, unspecified: Secondary | ICD-10-CM | POA: Diagnosis not present

## 2016-05-31 DIAGNOSIS — R32 Unspecified urinary incontinence: Secondary | ICD-10-CM

## 2016-05-31 DIAGNOSIS — C229 Malignant neoplasm of liver, not specified as primary or secondary: Secondary | ICD-10-CM

## 2016-05-31 DIAGNOSIS — C50919 Malignant neoplasm of unspecified site of unspecified female breast: Secondary | ICD-10-CM | POA: Diagnosis not present

## 2016-05-31 LAB — CBC WITH DIFFERENTIAL/PLATELET
BASO%: 1.2 % (ref 0.0–2.0)
Basophils Absolute: 0.1 10*3/uL (ref 0.0–0.1)
EOS%: 2.5 % (ref 0.0–7.0)
Eosinophils Absolute: 0.2 10*3/uL (ref 0.0–0.5)
HEMATOCRIT: 35.4 % (ref 34.8–46.6)
HEMOGLOBIN: 11.3 g/dL — AB (ref 11.6–15.9)
LYMPH#: 1.4 10*3/uL (ref 0.9–3.3)
LYMPH%: 18.4 % (ref 14.0–49.7)
MCH: 25.4 pg (ref 25.1–34.0)
MCHC: 32 g/dL (ref 31.5–36.0)
MCV: 79.7 fL (ref 79.5–101.0)
MONO#: 0.6 10*3/uL (ref 0.1–0.9)
MONO%: 8.5 % (ref 0.0–14.0)
NEUT%: 69.4 % (ref 38.4–76.8)
NEUTROS ABS: 5.2 10*3/uL (ref 1.5–6.5)
PLATELETS: 208 10*3/uL (ref 145–400)
RBC: 4.45 10*6/uL (ref 3.70–5.45)
RDW: 15 % — ABNORMAL HIGH (ref 11.2–14.5)
WBC: 7.5 10*3/uL (ref 3.9–10.3)

## 2016-05-31 LAB — COMPREHENSIVE METABOLIC PANEL
ALK PHOS: 117 U/L (ref 40–150)
ALT: 8 U/L (ref 0–55)
AST: 14 U/L (ref 5–34)
Albumin: 3.3 g/dL — ABNORMAL LOW (ref 3.5–5.0)
Anion Gap: 8 mEq/L (ref 3–11)
BUN: 22.3 mg/dL (ref 7.0–26.0)
CHLORIDE: 105 meq/L (ref 98–109)
CO2: 27 mEq/L (ref 22–29)
Calcium: 10.3 mg/dL (ref 8.4–10.4)
Creatinine: 1.3 mg/dL — ABNORMAL HIGH (ref 0.6–1.1)
EGFR: 38 mL/min/{1.73_m2} — AB (ref >90)
GLUCOSE: 86 mg/dL (ref 70–140)
POTASSIUM: 4.3 meq/L (ref 3.5–5.1)
SODIUM: 139 meq/L (ref 136–145)
Total Bilirubin: 0.26 mg/dL (ref 0.20–1.20)
Total Protein: 7.4 g/dL (ref 6.4–8.3)

## 2016-05-31 NOTE — Progress Notes (Signed)
Patient Care Team: [name], MD as Consulting Physician (General Surgery)  DIAGNOSIS:  Encounter Diagnosis  Name Primary?  . Adenocarcinoma of liver s/p partial right hepatic lobectomy 06/14/2015     SUMMARY OF ONCOLOGIC HISTORY:   Adenocarcinoma of liver s/p partial right hepatic lobectomy 06/14/2015   05/03/2015 Imaging    CT abdomen: Bilobed 8.1 cm mass in the liver segment 5. 3.8 cm fluid collection posterior and medial: Favoring adnexal cyst      05/04/2015 Initial Diagnosis    Poorly differentiated carcinoma strongly positive for CK 8/18, CK 7 and MOC-31; HPAR faint cytoplasmic staining; (negative for ER, PR, AFP, TTF-1, WT 1, p63, CD10, CDX 2, CK 20, CK 5/6, mucicarmine)      05/17/2015 PET scan    Hypermetabolic partially necrotic right liver lobe mass , hypermetabolic some within the descending/sigmoid colon junction in the region of inflammation/ diverticulitis versus cancer      06/14/2015 Surgery    liver partial resection segment 7 and 8: Poorly differentiated carcinoma 8.6 cm, margins negative,positive for ck8/18, ck 7, partially positive for ck20, polyclonalCEA, cd10 and negative for HEPAR, AFP, Monoclonal CEA, WT-1, cdx-2, ER, PR, GCDFP and TTF      06/14/2015 Procedure    cancer type ID: 96% pancreaticobiliary origin, 60% cholangiocarcinoma, 36% gallbladder adenocarcinoma      02/09/2016 Surgery    Sigmoid colectomy for recurrent diverticulitis with chronic stricture; no malignancy 4 lymph nodes negative       CHIEF COMPLIANT: 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 She underwent a recent sigmoid colectomy for a fistula. She is doing so much better. She does not have any more leakage. Her only complaints today are related to urinary  incontinence issues. She is eating very well at the nursing home it appears to be having good spirits. Complains of hair thinning  REVIEW OF SYSTEMS:   Constitutional: Denies fevers, chills or abnormal weight loss Eyes: Denies blurriness of vision Ears, nose, mouth, throat, and face: Denies mucositis or sore throat Respiratory: Denies cough, dyspnea or wheezes Cardiovascular: Denies palpitation, chest discomfort Gastrointestinal:  Denies nausea, heartburn or change in bowel habits Skin: Denies abnormal skin rashes Lymphatics: Denies new lymphadenopathy or easy bruising Neurological:Denies numbness, tingling or new weaknesses Behavioral/Psych: Mood is stable, no new changes  Extremities: No lower extremity edema  All other systems were reviewed with the patient and are negative.  I have reviewed the past medical history, past surgical history, social history and family history with the patient and they are unchanged from previous note.  ALLERGIES:  is allergic to levaquin [levofloxacin].  MEDICATIONS:  Current Outpatient Prescriptions  Medication Sig Dispense Refill  . acetaminophen (TYLENOL) 325 MG tablet Take 650 mg by mouth every 6 (six) hours as needed (pain).     Marland Kitchen amLODipine (NORVASC) 5 MG tablet Take 5 mg by mouth daily.    Marland Kitchen aspirin 81 MG tablet Take 81 mg by mouth daily.     . bisacodyl (DULCOLAX) 5 MG EC tablet Take 5 mg by mouth once. Took 4 times for bowel prep    . buPROPion (WELLBUTRIN XL) 150 MG 24 hr tablet Take 150 mg by mouth daily.    . feeding supplement (BOOST / RESOURCE BREEZE) LIQD Take 1 Container by mouth 2 (two) times daily between meals.     Marland Kitchen  HYDROcodone-acetaminophen (NORCO/VICODIN) 5-325 MG tablet Take 1-2 tablets by mouth every 6 (six) hours as needed for moderate pain. 30 tablet 0  . hydrocortisone (ANUSOL-HC) 2.5 % rectal cream Apply 1 application topically 4 (four) times daily as needed for hemorrhoids. 30 g 0  . levothyroxine (SYNTHROID, LEVOTHROID)  100 MCG tablet Take 100 mcg by mouth daily before breakfast.    . LORazepam (ATIVAN) 0.5 MG tablet Take 0.5 mg by mouth 2 (two) times daily as needed for anxiety.     . ondansetron (ZOFRAN) 4 MG tablet Take 4 mg by mouth daily as needed for nausea or vomiting.    . pantoprazole (PROTONIX) 40 MG tablet Take 40 mg by mouth 2 (two) times daily before a meal.    . polyethylene glycol (MIRALAX / GLYCOLAX) packet Take 17 g by mouth daily.    . Probiotic Product (PROBIOTIC DAILY PO) Take 1 tablet by mouth daily.     . ranitidine (ZANTAC) 150 MG capsule Take 150 mg by mouth daily as needed (indigestion).    . saccharomyces boulardii (FLORASTOR) 250 MG capsule Take 250 mg by mouth 2 (two) times daily.    . simethicone (MYLICON) 527 MG chewable tablet Chew 125 mg by mouth 3 (three) times daily before meals.    Marland Kitchen UNABLE TO FIND Med Name: neomycin     No current facility-administered medications for this visit.     PHYSICAL EXAMINATION: ECOG PERFORMANCE STATUS: 1 - Symptomatic but completely ambulatory  Vitals:   05/31/16 1124  BP: (!) 135/50  Pulse: 70  Resp: 18  Temp: 98.1 F (36.7 C)   Filed Weights   05/31/16 1124  Weight: 133 lb 8 oz (60.6 kg)    GENERAL:alert, no distress and comfortable SKIN: skin color, texture, turgor are normal, no rashes or significant lesions EYES: normal, Conjunctiva are pink and non-injected, sclera clear OROPHARYNX:no exudate, no erythema and lips, buccal mucosa, and tongue normal  NECK: supple, thyroid normal size, non-tender, without nodularity LYMPH:  no palpable lymphadenopathy in the cervical, axillary or inguinal LUNGS: clear to auscultation and percussion with normal breathing effort HEART: regular rate & rhythm and no murmurs and no lower extremity edema ABDOMEN:abdomen soft, non-tender and normal bowel sounds MUSCULOSKELETAL:no cyanosis of digits and no clubbing  NEURO: alert & oriented x 3 with fluent speech, no focal motor/sensory  deficits EXTREMITIES: No lower extremity edema  LABORATORY DATA:  I have reviewed the data as listed   Chemistry      Component Value Date/Time   NA 133 (L) 02/14/2016 0414   NA 138 11/30/2015 1033   K 3.7 02/14/2016 0414   K 3.3 (L) 11/30/2015 1033   CL 103 02/14/2016 0414   CO2 24 02/14/2016 0414   CO2 28 11/30/2015 1033   BUN 6 02/14/2016 0414   BUN 13.3 11/30/2015 1033   CREATININE 0.90 02/14/2016 0414   CREATININE 1.0 11/30/2015 1033   GLU 178 10/27/2015      Component Value Date/Time   CALCIUM 9.1 02/14/2016 0414   CALCIUM 10.1 11/30/2015 1033   ALKPHOS 68 02/07/2016 1038   ALKPHOS 66 11/30/2015 1033   AST 15 02/07/2016 1038   AST 14 11/30/2015 1033   ALT 8 (L) 02/07/2016 1038   ALT <9 11/30/2015 1033   BILITOT 0.4 02/07/2016 1038   BILITOT 0.31 11/30/2015 1033       Lab Results  Component Value Date   WBC 7.5 05/31/2016   HGB 11.3 (L) 05/31/2016   HCT 35.4 05/31/2016  MCV 79.7 05/31/2016   PLT 208 05/31/2016   NEUTROABS 5.2 05/31/2016     ASSESSMENT & PLAN:  Adenocarcinoma of liver s/p partial right hepatic lobectomy 06/14/2015 Liver partial resection segment 7 and 8 06/14/2015: Poorly differentiated carcinoma 8.6 cm, margins negative,positive for ck8/18, ck 7, partially positive for ck20, polyclonalCEA, cd10 and negative for HEPAR, AFP, CEA, WT-1, cdx-2, ER, PR, GCDFP and TTF-1) Cancer type ID: 96% pancreaticobiliary origin, 60% cholangiocarcinoma, 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  Sigmoid colectomy 02/09/2016 for recurrent diverticulitis with chronic strictures, 4 lymph nodes negative  Plan: Follow-up in 6 months with labs and CT scans.  Urinary incontinence: I discussed with her about detrusor instability. She will discuss with her  primary care physician if medications might help that.   No orders of the defined types were placed in this encounter.  The patient has a good understanding of the overall plan. she agrees with it. she will call with any problems that may develop before the next visit here.   Rulon Eisenmenger, MD 05/31/16

## 2016-05-31 NOTE — Assessment & Plan Note (Signed)
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  Sigmoid colectomy 02/09/2016 for recurrent diverticulitis with chronic strictures, 4 lymph nodes negative  Plan: Follow-up in 6 months with labs and CT scans.

## 2016-06-01 LAB — CANCER ANTIGEN 19-9 (PARALLEL TESTING): CA 19 9: 22 U/mL (ref <34)

## 2016-06-01 LAB — CANCER ANTIGEN 19-9: CA 19-9: 28 U/mL (ref 0–35)

## 2016-06-05 DIAGNOSIS — N281 Cyst of kidney, acquired: Secondary | ICD-10-CM | POA: Diagnosis not present

## 2016-06-05 DIAGNOSIS — N3000 Acute cystitis without hematuria: Secondary | ICD-10-CM | POA: Diagnosis not present

## 2016-06-07 DIAGNOSIS — Z23 Encounter for immunization: Secondary | ICD-10-CM | POA: Diagnosis not present

## 2016-08-09 DIAGNOSIS — N390 Urinary tract infection, site not specified: Secondary | ICD-10-CM | POA: Diagnosis not present

## 2016-08-28 DIAGNOSIS — R1084 Generalized abdominal pain: Secondary | ICD-10-CM | POA: Diagnosis not present

## 2016-09-09 DIAGNOSIS — R350 Frequency of micturition: Secondary | ICD-10-CM | POA: Diagnosis not present

## 2016-09-19 ENCOUNTER — Other Ambulatory Visit: Payer: Self-pay | Admitting: Hematology and Oncology

## 2016-09-19 DIAGNOSIS — Z1231 Encounter for screening mammogram for malignant neoplasm of breast: Secondary | ICD-10-CM

## 2016-09-24 ENCOUNTER — Ambulatory Visit
Admission: RE | Admit: 2016-09-24 | Discharge: 2016-09-24 | Disposition: A | Discharge status: Home / Self Care | Payer: Medicare Other | Source: Ambulatory Visit | Attending: Hematology and Oncology | Admitting: Hematology and Oncology

## 2016-09-24 DIAGNOSIS — Z1231 Encounter for screening mammogram for malignant neoplasm of breast: Secondary | ICD-10-CM

## 2016-10-16 DIAGNOSIS — N3091 Cystitis, unspecified with hematuria: Secondary | ICD-10-CM | POA: Diagnosis not present

## 2016-10-16 DIAGNOSIS — N3020 Other chronic cystitis without hematuria: Secondary | ICD-10-CM | POA: Diagnosis not present

## 2016-10-24 DIAGNOSIS — R350 Frequency of micturition: Secondary | ICD-10-CM | POA: Diagnosis not present

## 2016-10-24 DIAGNOSIS — N390 Urinary tract infection, site not specified: Secondary | ICD-10-CM | POA: Diagnosis not present

## 2016-10-24 DIAGNOSIS — K5909 Other constipation: Secondary | ICD-10-CM | POA: Diagnosis not present

## 2016-10-24 DIAGNOSIS — C229 Malignant neoplasm of liver, not specified as primary or secondary: Secondary | ICD-10-CM | POA: Diagnosis not present

## 2016-11-08 IMAGING — CT CT GUIDANCE NEEDLE PLACEMENT
1 of 2 series · 14 of 32 positions shown, 18 images · non-contrast
Comparison: none

CLINICAL DATA: Left lower quadrant pain x2 weeks. History of
partial hepatectomy. Recent CT demonstrates sigmoid diverticulitis
with suspicion of colovesical a colovaginal fistula. 5 cm loculated
fluid collection anterior to proximal left external iliac vessels.

[Series 2: i-spiral 5.0 b40f · axial · 0.96mm/px · z∈[+728,+927]mm · 14 of 63 slices shown, 18 images]
[im 3/63  soft-tissue]
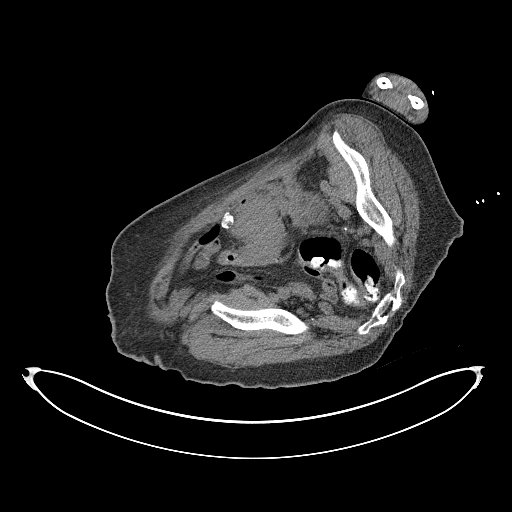
[im 3/63  bone]
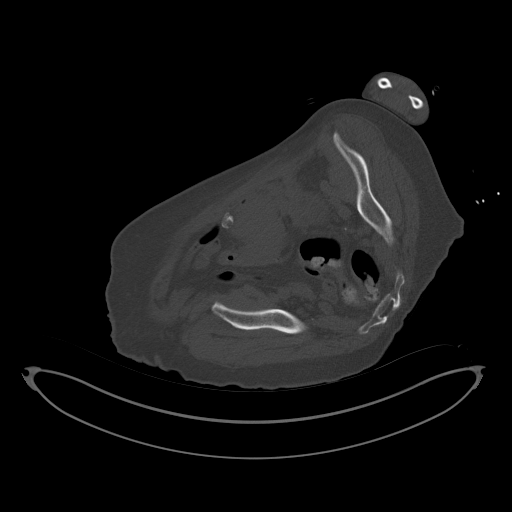
[im 9/63  soft-tissue]
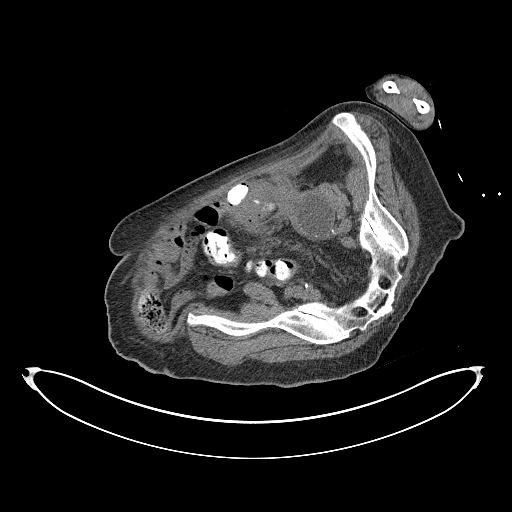
[im 14/63  soft-tissue]
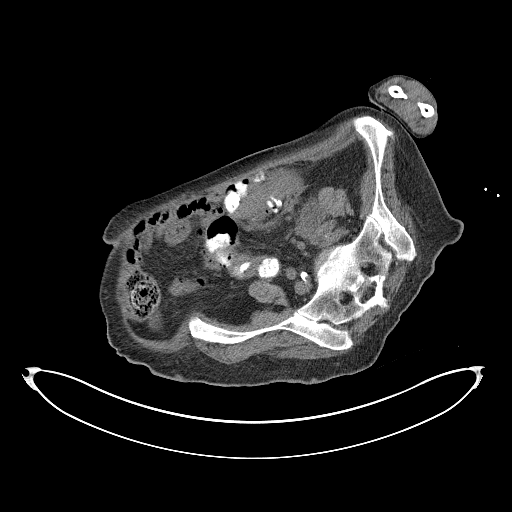
[im 19/63  soft-tissue]
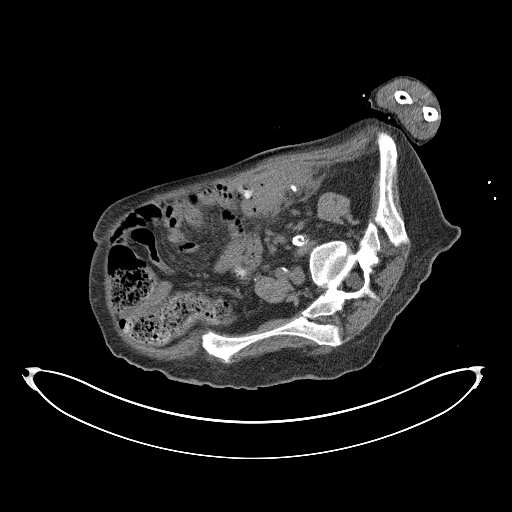
[im 25/63  soft-tissue]
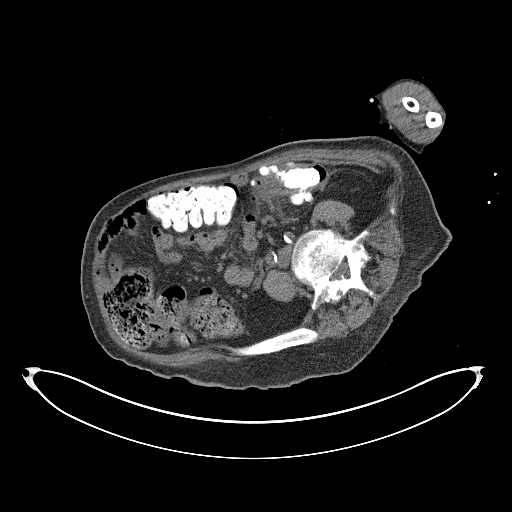
[im 30/63  soft-tissue]
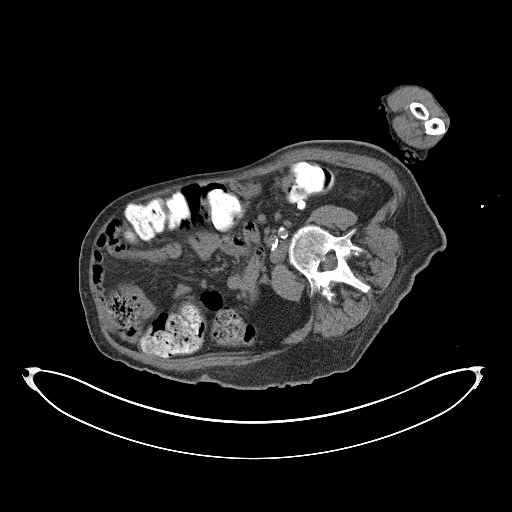
[im 33/63  soft-tissue]
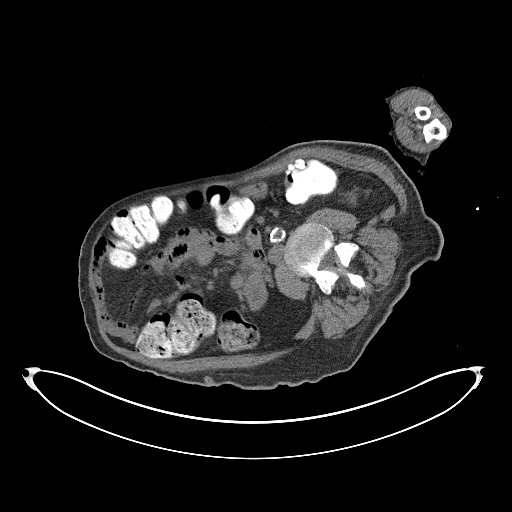
[im 38/63  soft-tissue]
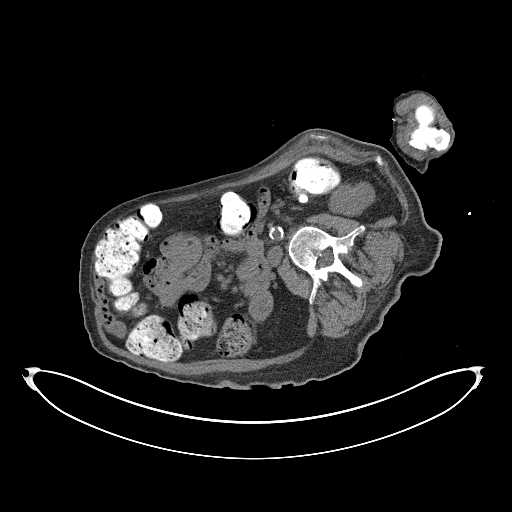
[im 44/63  soft-tissue]
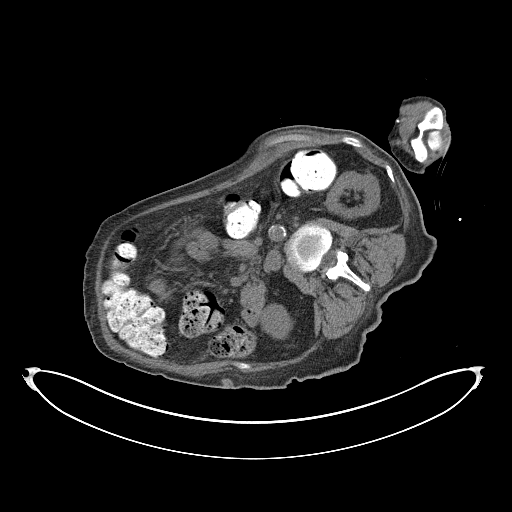
[im 44/63  bone]
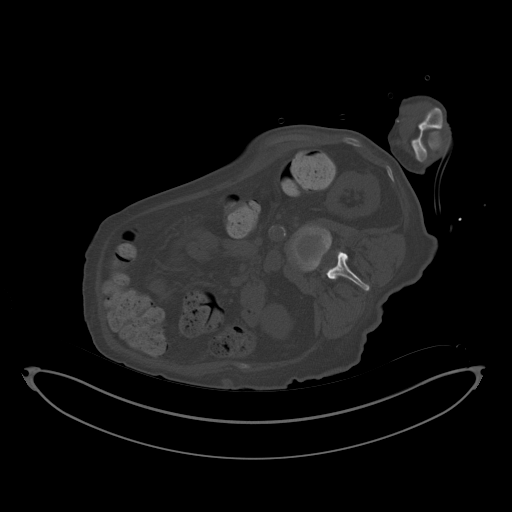
[im 49/63  soft-tissue]
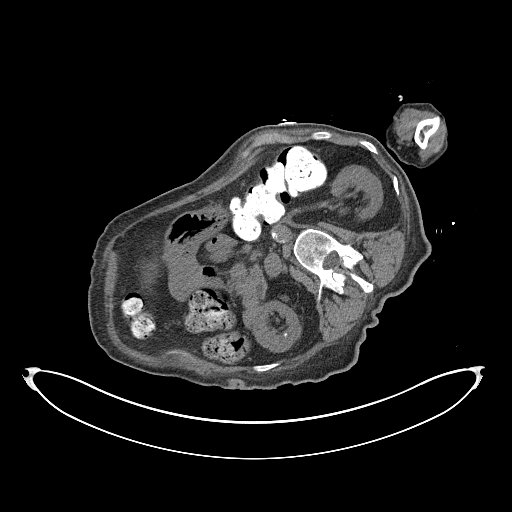
[im 52/63  lung]
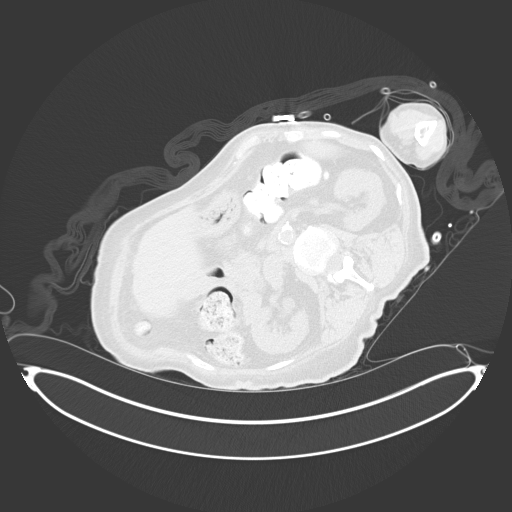
[im 54/63  soft-tissue]
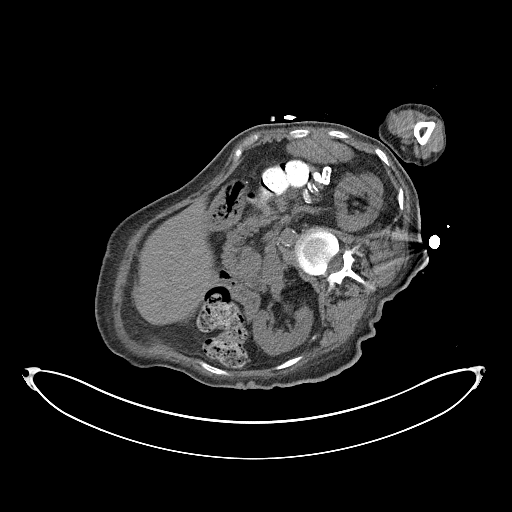
[im 54/63  lung]
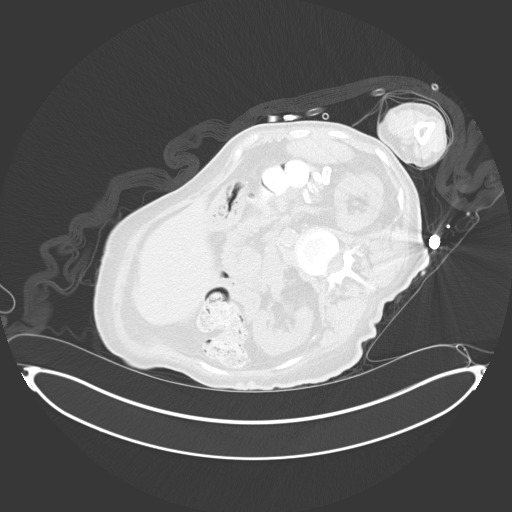
[im 57/63  lung]
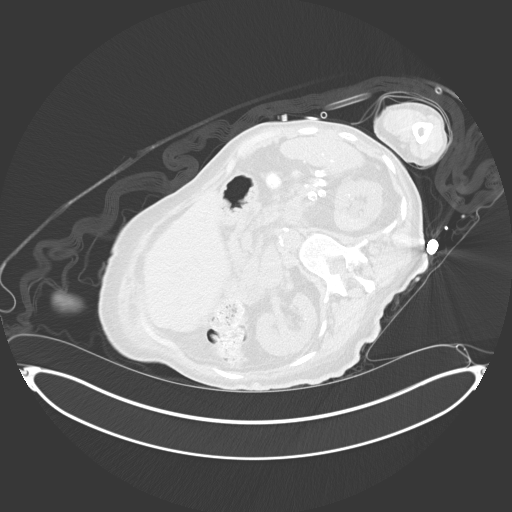
[im 60/63  soft-tissue]
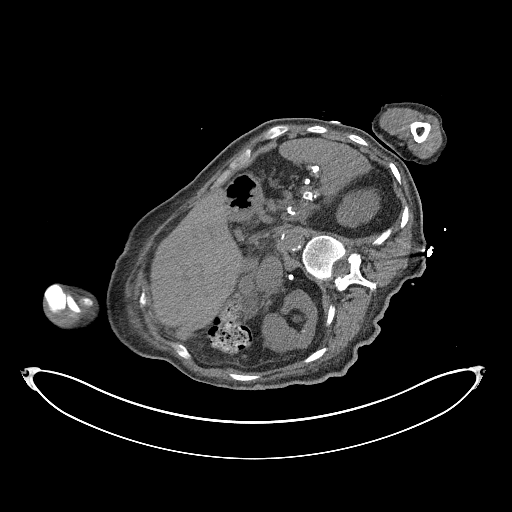
[im 60/63  lung]
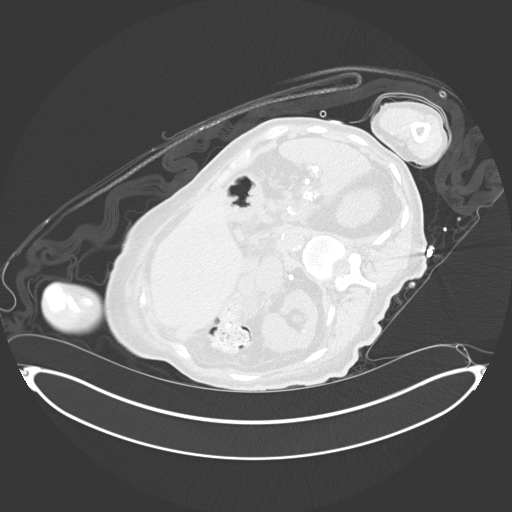

[14 of 32 positions shown; findings below may reference images not displayed]

EXAM:
CT GUIDED ASPIRATION BIOPSY OF LEFT PELVIC COLLECTION

ANESTHESIA/SEDATION:
Intravenous Fentanyl and Versed were administered as conscious
sedation during continuous monitoring of the patient's level of
consciousness and physiological / cardiorespiratory status by the
radiology RN, with a total moderate sedation time of 12 minutes.

PROCEDURE:
The procedure risks, benefits, and alternatives were explained to
the patient. Questions regarding the procedure were encouraged and
answered. The patient understands and consents to the procedure.

Patient placed in left anterior oblique position. Select axial scans
through the pelvis were obtained. The collection was localized and
an appropriate skin entry site determined and marked.

The operative field was prepped with chlorhexidinein a sterile
fashion, and a sterile drape was applied covering the operative
field. A sterile gown and sterile gloves were used for the
procedure. Local anesthesia was provided with 1% Lidocaine.

Under CT fluoroscopic guidance, an 18 gauge trocar needle was
advanced into the collection. 30 mL of thin pale yellow clear fluid
were aspirated. This resulted in virtually complete decompression of
the collection. A sample was sent for Gram stain, culture,
creatinine, and cytology. Postprocedure scans show no hemorrhage or
other apparent complication. No residual undrained fluid component.
The patient tolerated the procedure well.

COMPLICATIONS:
None immediate
FINDINGS: Thick walled Fluid collection anterior to the proximal left external
iliac vessels again localized. 30 mL of thin pale yellow fluid
aspirated as above.
IMPRESSION: 1. Technically successful CT-guided aspiration of left pelvic fluid
collection.
Critical Value/emergent results were called by telephone at the time
of interpretation on 02/07/2016 at [DATE] to Dr. MHOLO ALKASTER , who
verbally acknowledged these results.

## 2016-11-14 DIAGNOSIS — E039 Hypothyroidism, unspecified: Secondary | ICD-10-CM | POA: Diagnosis not present

## 2016-11-14 DIAGNOSIS — G3184 Mild cognitive impairment, so stated: Secondary | ICD-10-CM | POA: Diagnosis not present

## 2016-11-14 DIAGNOSIS — E559 Vitamin D deficiency, unspecified: Secondary | ICD-10-CM | POA: Diagnosis not present

## 2016-11-14 DIAGNOSIS — C229 Malignant neoplasm of liver, not specified as primary or secondary: Secondary | ICD-10-CM | POA: Diagnosis not present

## 2016-11-14 DIAGNOSIS — N189 Chronic kidney disease, unspecified: Secondary | ICD-10-CM | POA: Diagnosis not present

## 2016-11-14 DIAGNOSIS — E46 Unspecified protein-calorie malnutrition: Secondary | ICD-10-CM | POA: Diagnosis not present

## 2016-11-14 DIAGNOSIS — E119 Type 2 diabetes mellitus without complications: Secondary | ICD-10-CM | POA: Diagnosis not present

## 2016-11-14 DIAGNOSIS — I129 Hypertensive chronic kidney disease with stage 1 through stage 4 chronic kidney disease, or unspecified chronic kidney disease: Secondary | ICD-10-CM | POA: Diagnosis not present

## 2016-11-15 DIAGNOSIS — D649 Anemia, unspecified: Secondary | ICD-10-CM | POA: Diagnosis not present

## 2016-11-17 DIAGNOSIS — E559 Vitamin D deficiency, unspecified: Secondary | ICD-10-CM | POA: Diagnosis not present

## 2016-11-17 DIAGNOSIS — D649 Anemia, unspecified: Secondary | ICD-10-CM | POA: Diagnosis not present

## 2016-11-21 ENCOUNTER — Other Ambulatory Visit: Payer: Self-pay

## 2016-11-21 DIAGNOSIS — C229 Malignant neoplasm of liver, not specified as primary or secondary: Secondary | ICD-10-CM

## 2016-11-22 DIAGNOSIS — I10 Essential (primary) hypertension: Secondary | ICD-10-CM | POA: Diagnosis not present

## 2016-11-22 DIAGNOSIS — K219 Gastro-esophageal reflux disease without esophagitis: Secondary | ICD-10-CM | POA: Diagnosis not present

## 2016-11-22 DIAGNOSIS — N390 Urinary tract infection, site not specified: Secondary | ICD-10-CM | POA: Diagnosis not present

## 2016-11-22 DIAGNOSIS — E039 Hypothyroidism, unspecified: Secondary | ICD-10-CM | POA: Diagnosis not present

## 2016-11-22 DIAGNOSIS — C227 Other specified carcinomas of liver: Secondary | ICD-10-CM | POA: Diagnosis not present

## 2016-11-26 DIAGNOSIS — D649 Anemia, unspecified: Secondary | ICD-10-CM | POA: Diagnosis not present

## 2016-11-26 DIAGNOSIS — I129 Hypertensive chronic kidney disease with stage 1 through stage 4 chronic kidney disease, or unspecified chronic kidney disease: Secondary | ICD-10-CM | POA: Diagnosis not present

## 2016-11-28 ENCOUNTER — Ambulatory Visit (HOSPITAL_COMMUNITY)
Admission: RE | Admit: 2016-11-28 | Discharge: 2016-11-28 | Disposition: A | Discharge status: Home / Self Care | Payer: Medicare Other | Source: Ambulatory Visit | Attending: Hematology and Oncology | Admitting: Hematology and Oncology

## 2016-11-28 ENCOUNTER — Ambulatory Visit (HOSPITAL_COMMUNITY): Payer: Medicare Other

## 2016-11-28 ENCOUNTER — Encounter (HOSPITAL_COMMUNITY): Payer: Self-pay

## 2016-11-28 DIAGNOSIS — K838 Other specified diseases of biliary tract: Secondary | ICD-10-CM | POA: Insufficient documentation

## 2016-11-28 DIAGNOSIS — I2510 Atherosclerotic heart disease of native coronary artery without angina pectoris: Secondary | ICD-10-CM | POA: Insufficient documentation

## 2016-11-28 DIAGNOSIS — C229 Malignant neoplasm of liver, not specified as primary or secondary: Secondary | ICD-10-CM | POA: Insufficient documentation

## 2016-11-28 DIAGNOSIS — N200 Calculus of kidney: Secondary | ICD-10-CM | POA: Diagnosis not present

## 2016-11-28 DIAGNOSIS — R140 Abdominal distension (gaseous): Secondary | ICD-10-CM | POA: Diagnosis not present

## 2016-11-28 DIAGNOSIS — K769 Liver disease, unspecified: Secondary | ICD-10-CM | POA: Diagnosis not present

## 2016-11-28 DIAGNOSIS — K7689 Other specified diseases of liver: Secondary | ICD-10-CM | POA: Diagnosis not present

## 2016-11-28 DIAGNOSIS — I700 Atherosclerosis of aorta: Secondary | ICD-10-CM | POA: Insufficient documentation

## 2016-11-28 MED ORDER — IOPAMIDOL (ISOVUE-300) INJECTION 61%
INTRAVENOUS | Status: AC
  Administered 2016-11-28: 100 mL
  Filled 2016-11-28: qty 100

## 2016-11-29 ENCOUNTER — Ambulatory Visit (HOSPITAL_BASED_OUTPATIENT_CLINIC_OR_DEPARTMENT_OTHER): Payer: Medicare Other | Admitting: Hematology and Oncology

## 2016-11-29 ENCOUNTER — Encounter: Payer: Self-pay | Admitting: Hematology and Oncology

## 2016-11-29 DIAGNOSIS — K769 Liver disease, unspecified: Secondary | ICD-10-CM | POA: Diagnosis not present

## 2016-11-29 DIAGNOSIS — C229 Malignant neoplasm of liver, not specified as primary or secondary: Secondary | ICD-10-CM

## 2016-11-29 DIAGNOSIS — C227 Other specified carcinomas of liver: Secondary | ICD-10-CM

## 2016-11-29 NOTE — Progress Notes (Signed)
Patient Care Team: [name] Patient as PCP - General (General Practice) [name], MD as Consulting Physician (General Surgery)  DIAGNOSIS:  Encounter Diagnosis  Name Primary?  . Adenocarcinoma of liver s/p partial right hepatic lobectomy 06/14/2015     SUMMARY OF ONCOLOGIC HISTORY:   Adenocarcinoma of liver s/p partial right hepatic lobectomy 06/14/2015   05/03/2015 Imaging    CT abdomen: Bilobed 8.1 cm mass in the liver segment 5. 3.8 cm fluid collection posterior and medial: Favoring adnexal cyst      05/04/2015 Initial Diagnosis    Poorly differentiated carcinoma strongly positive for CK 8/18, CK 7 and MOC-31; HPAR faint cytoplasmic staining; (negative for ER, PR, AFP, TTF-1, WT 1, p63, CD10, CDX 2, CK 20, CK 5/6, mucicarmine)      05/17/2015 PET scan    Hypermetabolic partially necrotic right liver lobe mass , hypermetabolic some within the descending/sigmoid colon junction in the region of inflammation/ diverticulitis versus cancer      06/14/2015 Surgery    liver partial resection segment 7 and 8: Poorly differentiated carcinoma 8.6 cm, margins negative,positive for ck8/18, ck 7, partially positive for ck20, polyclonalCEA, cd10 and negative for HEPAR, AFP, Monoclonal CEA, WT-1, cdx-2, ER, PR, GCDFP and TTF      06/14/2015 Procedure    cancer type ID: 96% pancreaticobiliary origin, 60% cholangiocarcinoma, 36% gallbladder adenocarcinoma      02/09/2016 Surgery    Sigmoid colectomy for recurrent diverticulitis with chronic stricture; no malignancy 4 lymph nodes negative      11/28/2016 Relapse/Recurrence    New right hepatic lobe lesion worrisome for recurrent tumor 4 cm       CHIEF COMPLIANT: Follow-up to review the recent CT scans  INTERVAL HISTORY: Destiny Velasquez is a 83 year old lady with the above-mentioned history of liver resection for poorly differentiated carcinoma who also had sigmoid colectomy in June 2017 for recurrent diverticulitis. She had a recent CT  scan is here to discuss the results. She clinically has no new symptoms or concerns. She has recovered very well from the prior surgeries. She looks to be in excellent shape. Recent CT scan 11/28/2016 revealed a new right hepatic lesion measuring 4 cm worsening for malignancy.  REVIEW OF SYSTEMS:   Constitutional: Denies fevers, chills or abnormal weight loss Eyes: Denies blurriness of vision Ears, nose, mouth, throat, and face: Denies mucositis or sore throat Respiratory: Denies cough, dyspnea or wheezes Cardiovascular: Denies palpitation, chest discomfort Gastrointestinal:  Denies nausea, heartburn or change in bowel habits Skin: Denies abnormal skin rashes Lymphatics: Denies new lymphadenopathy or easy bruising Neurological:Denies numbness, tingling or new weaknesses Behavioral/Psych: Mood is stable, no new changes  Extremities: No lower extremity edema Breast:  denies any pain or lumps or nodules in either breasts All other systems were reviewed with the patient and are negative.  I have reviewed the past medical history, past surgical history, social history and family history with the patient and they are unchanged from previous note.  ALLERGIES:  is allergic to levaquin [levofloxacin].  MEDICATIONS:  Current Outpatient Prescriptions  Medication Sig Dispense Refill  . acetaminophen (TYLENOL) 325 MG tablet Take 650 mg by mouth every 6 (six) hours as needed (pain).     Marland Kitchen amLODipine (NORVASC) 5 MG tablet Take 5 mg by mouth daily.    Marland Kitchen aspirin 81 MG tablet Take 81 mg by mouth daily.     . bisacodyl (DULCOLAX) 5 MG EC tablet Take 5 mg by mouth once. Took 4 times for bowel  prep    . buPROPion (WELLBUTRIN XL) 150 MG 24 hr tablet Take 150 mg by mouth daily.    . feeding supplement (BOOST / RESOURCE BREEZE) LIQD Take 1 Container by mouth 2 (two) times daily between meals.     Marland Kitchen HYDROcodone-acetaminophen (NORCO/VICODIN) 5-325 MG tablet Take 1-2 tablets by mouth every 6 (six) hours as needed  for moderate pain. 30 tablet 0  . hydrocortisone (ANUSOL-HC) 2.5 % rectal cream Apply 1 application topically 4 (four) times daily as needed for hemorrhoids. 30 g 0  . levothyroxine (SYNTHROID, LEVOTHROID) 100 MCG tablet Take 100 mcg by mouth daily before breakfast.    . LORazepam (ATIVAN) 0.5 MG tablet Take 0.5 mg by mouth 2 (two) times daily as needed for anxiety.     . ondansetron (ZOFRAN) 4 MG tablet Take 4 mg by mouth daily as needed for nausea or vomiting.    . pantoprazole (PROTONIX) 40 MG tablet Take 40 mg by mouth 2 (two) times daily before a meal.    . polyethylene glycol (MIRALAX / GLYCOLAX) packet Take 17 g by mouth daily.    . Probiotic Product (PROBIOTIC DAILY PO) Take 1 tablet by mouth daily.     . ranitidine (ZANTAC) 150 MG capsule Take 150 mg by mouth daily as needed (indigestion).    . saccharomyces boulardii (FLORASTOR) 250 MG capsule Take 250 mg by mouth 2 (two) times daily.    . simethicone (MYLICON) 536 MG chewable tablet Chew 125 mg by mouth 3 (three) times daily before meals.    Marland Kitchen UNABLE TO FIND Med Name: neomycin     No current facility-administered medications for this visit.     PHYSICAL EXAMINATION: ECOG PERFORMANCE STATUS: 1 - Symptomatic but completely ambulatory  Vitals:   11/29/16 0950  BP: (!) 139/45  Pulse: 72  Resp: 18  Temp: 97.8 F (36.6 C)   Filed Weights   11/29/16 0950  Weight: 149 lb 11.2 oz (67.9 kg)    GENERAL:alert, no distress and comfortable SKIN: skin color, texture, turgor are normal, no rashes or significant lesions EYES: normal, Conjunctiva are pink and non-injected, sclera clear OROPHARYNX:no exudate, no erythema and lips, buccal mucosa, and tongue normal  NECK: supple, thyroid normal size, non-tender, without nodularity LYMPH:  no palpable lymphadenopathy in the cervical, axillary or inguinal LUNGS: clear to auscultation and percussion with normal breathing effort HEART: regular rate & rhythm and no murmurs and no lower  extremity edema ABDOMEN:abdomen soft, non-tender and normal bowel sounds MUSCULOSKELETAL:no cyanosis of digits and no clubbing  NEURO: alert & oriented x 3 with fluent speech, no focal motor/sensory deficits EXTREMITIES: No lower extremity edema  LABORATORY DATA:  I have reviewed the data as listed   Chemistry      Component Value Date/Time   NA 139 05/31/2016 1109   K 4.3 05/31/2016 1109   CL 103 02/14/2016 0414   CO2 27 05/31/2016 1109   BUN 22.3 05/31/2016 1109   CREATININE 1.3 (H) 05/31/2016 1109   GLU 178 10/27/2015      Component Value Date/Time   CALCIUM 10.3 05/31/2016 1109   ALKPHOS 117 05/31/2016 1109   AST 14 05/31/2016 1109   ALT 8 05/31/2016 1109   BILITOT 0.26 05/31/2016 1109       Lab Results  Component Value Date   WBC 7.5 05/31/2016   HGB 11.3 (L) 05/31/2016   HCT 35.4 05/31/2016   MCV 79.7 05/31/2016   PLT 208 05/31/2016   NEUTROABS 5.2 05/31/2016  ASSESSMENT & PLAN:  Adenocarcinoma of liver s/p partial right hepatic lobectomy 06/14/2015 Liver partial resection segment 7 and 8 06/14/2015: Poorly differentiated carcinoma 8.6 cm, margins negative,positive for ck8/18, ck 7, partially positive for ck20, polyclonalCEA, cd10 and negative for HEPAR, AFP, CEA, WT-1, cdx-2, ER, PR, GCDFP and TTF-1) Cancer type ID: 96% pancreaticobiliary origin, 60% cholangiocarcinoma, 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  Sigmoid colectomy 02/09/2016 for recurrent diverticulitis with chronic strictures, 4 lymph nodes negative  CT abdomen 11/28/2016: New Right hepatic lobe liver lesion worrisome for recurrent tumor or new metastatic disease measuring 4 x 3 x 2.4 cm  Radiology review: I discussed with the patient the results of the CT scan which showed a new  lesion in the right lobe of the liver. I discussed different options including the resection versus biopsy versus observation.Patient is keen on having surgery. I will discuss with Dr. Barry Dienes and will get back to her.  Return to clinic after surgery. I spent 25 minutes talking to the patient of which more than half was spent in counseling and coordination of care.  No orders of the defined types were placed in this encounter.  The patient has a good understanding of the overall plan. she agrees with it. she will call with any problems that may develop before the next visit here.   Rulon Eisenmenger, MD 11/29/16

## 2016-11-29 NOTE — Assessment & Plan Note (Signed)
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  Sigmoid colectomy 02/09/2016 for recurrent diverticulitis with chronic strictures, 4 lymph nodes negative  CT abdomen 11/28/2016: New Right hepatic lobe liver lesion worrisome for recurrent tumor or new metastatic disease measuring 4 x 3 x 2.4 cm  Radiology review: I discussed with the patient the results of the CT scan which shows no metastatic disease. Was only a year ago that she had liver resection. I discussed different options including the resection versus biopsy versus observation.

## 2016-12-03 ENCOUNTER — Other Ambulatory Visit: Payer: Self-pay | Admitting: Hematology and Oncology

## 2016-12-03 DIAGNOSIS — C229 Malignant neoplasm of liver, not specified as primary or secondary: Secondary | ICD-10-CM

## 2016-12-03 DIAGNOSIS — R160 Hepatomegaly, not elsewhere classified: Secondary | ICD-10-CM

## 2016-12-04 ENCOUNTER — Telehealth: Payer: Self-pay

## 2016-12-04 ENCOUNTER — Other Ambulatory Visit: Payer: Self-pay

## 2016-12-04 DIAGNOSIS — C229 Malignant neoplasm of liver, not specified as primary or secondary: Secondary | ICD-10-CM

## 2016-12-04 NOTE — Telephone Encounter (Signed)
Called pt daughter back to confirm MRI of liver at [facility] on 4/30 at 3pm. Pt to be npo for 4hrs prior to mri. Pt daughter verbalized understanding and has no further concerns at this time. Pt is scheduled with Dr.Byerly 5/2.

## 2016-12-04 NOTE — Telephone Encounter (Signed)
Called pt daughter to return her vm. Notified Destiny Velasquez that Dr.Gudena did get a hold of Dr.Byerly yesterday and pt will need to have an MRI of the liver done first. Sent pre auth to [facility] and will schedule pt for this test as soon as possible. Will notify daughter of updates. Pt daughter states that they are very anxious and need to know what the next step would be. Told daughter that will make plan when results of MRI available. Destiny Velasquez verbalized understanding.

## 2016-12-10 ENCOUNTER — Ambulatory Visit (HOSPITAL_COMMUNITY)
Admission: RE | Admit: 2016-12-10 | Discharge: 2016-12-10 | Disposition: A | Discharge status: Home / Self Care | Payer: Medicare Other | Source: Ambulatory Visit | Attending: Hematology and Oncology | Admitting: Hematology and Oncology

## 2016-12-10 DIAGNOSIS — I313 Pericardial effusion (noninflammatory): Secondary | ICD-10-CM | POA: Diagnosis not present

## 2016-12-10 DIAGNOSIS — I700 Atherosclerosis of aorta: Secondary | ICD-10-CM | POA: Insufficient documentation

## 2016-12-10 DIAGNOSIS — I517 Cardiomegaly: Secondary | ICD-10-CM | POA: Diagnosis not present

## 2016-12-10 DIAGNOSIS — R590 Localized enlarged lymph nodes: Secondary | ICD-10-CM | POA: Diagnosis not present

## 2016-12-10 DIAGNOSIS — R160 Hepatomegaly, not elsewhere classified: Secondary | ICD-10-CM | POA: Diagnosis not present

## 2016-12-10 DIAGNOSIS — M47816 Spondylosis without myelopathy or radiculopathy, lumbar region: Secondary | ICD-10-CM | POA: Insufficient documentation

## 2016-12-10 DIAGNOSIS — C229 Malignant neoplasm of liver, not specified as primary or secondary: Secondary | ICD-10-CM | POA: Diagnosis not present

## 2016-12-10 DIAGNOSIS — M5136 Other intervertebral disc degeneration, lumbar region: Secondary | ICD-10-CM | POA: Diagnosis not present

## 2016-12-10 LAB — POCT I-STAT CREATININE: CREATININE: 1.4 mg/dL — AB (ref 0.44–1.00)

## 2016-12-10 MED ORDER — GADOBENATE DIMEGLUMINE 529 MG/ML IV SOLN
15.0000 mL | Freq: Once | INTRAVENOUS | Status: AC | PRN
  Administered 2016-12-10: 7 mL via INTRAVENOUS

## 2016-12-11 DIAGNOSIS — C229 Malignant neoplasm of liver, not specified as primary or secondary: Secondary | ICD-10-CM | POA: Diagnosis not present

## 2016-12-13 ENCOUNTER — Encounter: Payer: Self-pay | Admitting: Radiation Oncology

## 2016-12-17 NOTE — Progress Notes (Signed)
GI Location of Tumor / Histology: Liver Adenocarcinoma recurrence  [name] presentedmonths ago with symptoms of:   Biopsies of  (if applicable) revealed: Diagnosis 06/14/2015: 1. Liver, biopsy, left hepatic lobe nodule HYALINIZED CALCIFIED NODULES AND HEMANGIOMA NEGATIVE FOR MALIGNANCY 2. Liver, partial resection, segments seven and eight POORLY DIFFERENTIATED CARCINOMA (8.6 CM). SEE COMMENT MARGINS OF RESECTION ARE NEGATIVE FOR TUMOR  Past/Anticipated interventions by surgeon, if any: s/p partial Right Hepatectomy 06/14/2015  Past/Anticipated interventions by medical oncology, if any: Dr. Lindi Adie, MD seen 11/29/16, return  To se Dr. Lindi Adie after surgery  MRI LIVER 4/30/218:  IMPRESSION: 1. Recurrent tumor posteriorly in the liver near the resection site, the new masses centrally necrotic has a volume of about 21 cubic cm. Questionable second focus of tumor just medial to the adjacent vascular clips on image 52/901, although this focal arterial phase enhancement in the secondary site could be due to fibrosis rather than a second focus of tumor. 2. Mildly prominent pericardial lymph node at 1.2 cm in short axis. 3. Mild cardiomegaly and small pericardial effusion. 4.  Aortic Atherosclerosis (ICD10-I70.0). 5. Suspected impingement at L4-5 due to spondylosis and degenerative disc disease.  Sigmoid colectomy 02/09/2016 for recurrent diverticulitis with chronic strictures, 4lymph nodes negative  CT abdomen 11/28/2016: New Right hepatic lobe liver lesion worrisome for recurrent tumor or new metastatic disease measuring 4 x 3 x 2.4 cm  Weight changes, if any: lost 20 lnbs from 1st surgery to 114 lb now back up to 147lb  Bowel/Bladder complaints, if any: UTI"S,  On antibiotic now, normal soft formed stools on Milarax dialy  Nausea / Vomiting, if any: No  Pain issues, if any:  No  Any blood per rectum:  No   SAFETY ISSUES: Yes, uses walker   Prior radiation?   NO  Pacemaker/ICD?  NO Is the patient on methotrexate?  NO  Current Complaints/Details:12/12/2016 appt with Dr. Ralene Muskrat, her note referral  For either stereotactic radiaotherapy or microwave abalation if a candidate Hx previous breast cancer 50 years ago,  Resides at [facility] assisted living facility. BP (!) 141/54 (BP Location: Left Arm, Patient Position: Sitting, Cuff Size: Normal)   Pulse 72   Temp 98.3 F (36.8 C) (Oral)   Resp 18   Ht '5\' 3"'$  (1.6 m)   Wt 147 lb 12.8 oz (67 kg)   SpO2 100% Comment: room air  BMI 26.18 kg/m   Wt Readings from Last 3 Encounters:  12/19/16 147 lb 12.8 oz (67 kg)  11/29/16 149 lb 11.2 oz (67.9 kg)  05/31/16 133 lb 8 oz (60.6 kg)   Allergies: Levaquin

## 2016-12-18 NOTE — Assessment & Plan Note (Signed)
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  Sigmoid colectomy 02/09/2016 for recurrent diverticulitis with chronic strictures, 4lymph nodes negative  CT abdomen 11/28/2016: New Right hepatic lobe liver lesion worrisome for recurrent tumor or new metastatic disease measuring 4 x 3 x 2.4 cm

## 2016-12-19 ENCOUNTER — Ambulatory Visit
Admission: RE | Admit: 2016-12-19 | Discharge: 2016-12-19 | Disposition: A | Discharge status: Home / Self Care | Payer: Medicare Other | Source: Ambulatory Visit | Attending: Radiation Oncology | Admitting: Radiation Oncology

## 2016-12-19 ENCOUNTER — Encounter: Payer: Self-pay | Admitting: Radiation Oncology

## 2016-12-19 ENCOUNTER — Ambulatory Visit (HOSPITAL_BASED_OUTPATIENT_CLINIC_OR_DEPARTMENT_OTHER): Payer: Medicare Other | Admitting: Hematology and Oncology

## 2016-12-19 ENCOUNTER — Encounter: Payer: Self-pay | Admitting: Hematology and Oncology

## 2016-12-19 DIAGNOSIS — C787 Secondary malignant neoplasm of liver and intrahepatic bile duct: Secondary | ICD-10-CM | POA: Insufficient documentation

## 2016-12-19 DIAGNOSIS — C229 Malignant neoplasm of liver, not specified as primary or secondary: Secondary | ICD-10-CM | POA: Diagnosis not present

## 2016-12-19 DIAGNOSIS — Z8673 Personal history of transient ischemic attack (TIA), and cerebral infarction without residual deficits: Secondary | ICD-10-CM | POA: Diagnosis not present

## 2016-12-19 DIAGNOSIS — Z510 Encounter for antineoplastic radiation therapy: Secondary | ICD-10-CM | POA: Insufficient documentation

## 2016-12-19 DIAGNOSIS — Z79899 Other long term (current) drug therapy: Secondary | ICD-10-CM | POA: Insufficient documentation

## 2016-12-19 DIAGNOSIS — Z853 Personal history of malignant neoplasm of breast: Secondary | ICD-10-CM | POA: Insufficient documentation

## 2016-12-19 DIAGNOSIS — G250 Essential tremor: Secondary | ICD-10-CM | POA: Diagnosis not present

## 2016-12-19 DIAGNOSIS — E785 Hyperlipidemia, unspecified: Secondary | ICD-10-CM | POA: Diagnosis not present

## 2016-12-19 DIAGNOSIS — C220 Liver cell carcinoma: Secondary | ICD-10-CM

## 2016-12-19 DIAGNOSIS — Z9011 Acquired absence of right breast and nipple: Secondary | ICD-10-CM | POA: Diagnosis not present

## 2016-12-19 DIAGNOSIS — F329 Major depressive disorder, single episode, unspecified: Secondary | ICD-10-CM | POA: Insufficient documentation

## 2016-12-19 DIAGNOSIS — E039 Hypothyroidism, unspecified: Secondary | ICD-10-CM | POA: Insufficient documentation

## 2016-12-19 DIAGNOSIS — M8580 Other specified disorders of bone density and structure, unspecified site: Secondary | ICD-10-CM | POA: Diagnosis not present

## 2016-12-19 DIAGNOSIS — Z9049 Acquired absence of other specified parts of digestive tract: Secondary | ICD-10-CM | POA: Insufficient documentation

## 2016-12-19 DIAGNOSIS — Z8249 Family history of ischemic heart disease and other diseases of the circulatory system: Secondary | ICD-10-CM | POA: Diagnosis not present

## 2016-12-19 DIAGNOSIS — Z87891 Personal history of nicotine dependence: Secondary | ICD-10-CM | POA: Diagnosis not present

## 2016-12-19 DIAGNOSIS — Z8509 Personal history of malignant neoplasm of other digestive organs: Secondary | ICD-10-CM | POA: Insufficient documentation

## 2016-12-19 DIAGNOSIS — K219 Gastro-esophageal reflux disease without esophagitis: Secondary | ICD-10-CM | POA: Diagnosis not present

## 2016-12-19 DIAGNOSIS — I10 Essential (primary) hypertension: Secondary | ICD-10-CM | POA: Insufficient documentation

## 2016-12-19 DIAGNOSIS — Z7951 Long term (current) use of inhaled steroids: Secondary | ICD-10-CM | POA: Diagnosis not present

## 2016-12-19 DIAGNOSIS — Z881 Allergy status to other antibiotic agents status: Secondary | ICD-10-CM | POA: Insufficient documentation

## 2016-12-19 DIAGNOSIS — Z7982 Long term (current) use of aspirin: Secondary | ICD-10-CM | POA: Insufficient documentation

## 2016-12-19 DIAGNOSIS — C228 Malignant neoplasm of liver, primary, unspecified as to type: Secondary | ICD-10-CM | POA: Diagnosis not present

## 2016-12-19 DIAGNOSIS — C227 Other specified carcinomas of liver: Secondary | ICD-10-CM

## 2016-12-19 NOTE — Progress Notes (Signed)
Patient Care Team: Patient, [name] as PCP - General (General Practice) Destiny Klein, MD as Consulting Physician (General Surgery)  DIAGNOSIS:  Encounter Diagnosis  Name Primary?  . Adenocarcinoma of liver s/p partial right hepatic lobectomy 06/14/2015     SUMMARY OF ONCOLOGIC HISTORY:   Adenocarcinoma of liver s/p partial right hepatic lobectomy 06/14/2015   05/03/2015 Imaging    CT abdomen: Bilobed 8.1 cm mass in the liver segment 5. 3.8 cm fluid collection posterior and medial: Favoring adnexal cyst      05/04/2015 Initial Diagnosis    Poorly differentiated carcinoma strongly positive for CK 8/18, CK 7 and MOC-31; HPAR faint cytoplasmic staining; (negative for ER, PR, AFP, TTF-1, WT 1, p63, CD10, CDX 2, CK 20, CK 5/6, mucicarmine)      05/17/2015 PET scan    Hypermetabolic partially necrotic right liver lobe mass , hypermetabolic some within the descending/sigmoid colon junction in the region of inflammation/ diverticulitis versus cancer      06/14/2015 Surgery    liver partial resection segment 7 and 8: Poorly differentiated carcinoma 8.6 cm, margins negative,positive for ck8/18, ck 7, partially positive for ck20, polyclonalCEA, cd10 and negative for HEPAR, AFP, Monoclonal CEA, WT-1, cdx-2, ER, PR, GCDFP and TTF      06/14/2015 Procedure    cancer type ID: 96% pancreaticobiliary origin, 60% cholangiocarcinoma, 36% gallbladder adenocarcinoma      02/09/2016 Surgery    Sigmoid colectomy for recurrent diverticulitis with chronic stricture; no malignancy 4 lymph nodes negative      11/28/2016 Relapse/Recurrence    New right hepatic lobe lesion worrisome for recurrent tumor 4 cm       CHIEF COMPLIANT: Patient is here to discuss the results of MRI and treatment plan  INTERVAL HISTORY: Destiny Velasquez is a 83 year old with above-mentioned history of adenocarcinoma who underwent liver resection in November 2016 who presented with recurrence of this lesion in April 2018. We  obtained a liver MRI on 12/10/2016 which revealed a recurrent tumors measuring 21 cm volume. There are multiple masses with central necrosis. Dr. Barry Velasquez evaluated her and determined that she should be seen by radiation for stereotactic radiosurgery. She is not a good candidate for abdominal surgery based on her previous experience with developing a TIA after the prior abdominal surgery for the liver resection. She is here today to discuss the treatment plan. She is asymptomatic from the liver lesion point of view.  REVIEW OF SYSTEMS:   Constitutional: Denies fevers, chills or abnormal weight loss Eyes: Denies blurriness of vision Ears, nose, mouth, throat, and face: Denies mucositis or sore throat Respiratory: Denies cough, dyspnea or wheezes Cardiovascular: Denies palpitation, chest discomfort Gastrointestinal:  Denies nausea, heartburn or change in bowel habits Skin: Denies abnormal skin rashes Lymphatics: Denies new lymphadenopathy or easy bruising Neurological:Denies numbness, tingling or new weaknesses Behavioral/Psych: Mood is stable, no new changes  Extremities: No lower extremity edema  All other systems were reviewed with the patient and are negative.  I have reviewed the past medical history, past surgical history, social history and family history with the patient and they are unchanged from previous note.  ALLERGIES:  is allergic to levaquin [levofloxacin].  MEDICATIONS:  Current Outpatient Prescriptions  Medication Sig Dispense Refill  . acetaminophen (TYLENOL) 325 MG tablet Take 650 mg by mouth every 6 (six) hours as needed (pain).     Marland Kitchen amLODipine (NORVASC) 5 MG tablet Take 5 mg by mouth daily.    Marland Kitchen aspirin 81 MG tablet Take 81  mg by mouth daily.     . bisacodyl (DULCOLAX) 5 MG EC tablet Take 5 mg by mouth once. Took 4 times for bowel prep    . buPROPion (WELLBUTRIN XL) 150 MG 24 hr tablet Take 150 mg by mouth daily.    . Cranberry (ELLURA) 200 MG CAPS Take 200 mg by  mouth daily.    . feeding supplement (BOOST / RESOURCE BREEZE) LIQD Take 1 Container by mouth 2 (two) times daily between meals.     . fluticasone (FLONASE) 50 MCG/ACT nasal spray Place 2 sprays into both nostrils daily.    Marland Kitchen HYDROcodone-acetaminophen (NORCO/VICODIN) 5-325 MG tablet Take 1-2 tablets by mouth every 6 (six) hours as needed for moderate pain. (Patient not taking: Reported on 12/19/2016) 30 tablet 0  . hydrocortisone (ANUSOL-HC) 2.5 % rectal cream Apply 1 application topically 4 (four) times daily as needed for hemorrhoids. (Patient not taking: Reported on 12/19/2016) 30 g 0  . iron polysaccharides (NIFEREX) 150 MG capsule Take 150 mg by mouth daily.    . Lactobacillus-Inulin (Stanley) CAPS Take 1 capsule by mouth daily.    Marland Kitchen levothyroxine (SYNTHROID, LEVOTHROID) 100 MCG tablet Take 100 mcg by mouth daily before breakfast.    . loperamide (IMODIUM A-D) 2 MG tablet Take 2 mg by mouth as needed for diarrhea or loose stools.    Marland Kitchen LORazepam (ATIVAN) 0.5 MG tablet Take 0.5 mg by mouth 2 (two) times daily as needed for anxiety.     . Magnesium 100 MG CAPS Take 100 mg by mouth daily.    . ondansetron (ZOFRAN) 4 MG tablet Take 4 mg by mouth daily as needed for nausea or vomiting.    . pantoprazole (PROTONIX) 40 MG tablet Take 40 mg by mouth 2 (two) times daily before a meal.    . polyethylene glycol (MIRALAX / GLYCOLAX) packet Take 17 g by mouth daily.    . Probiotic Product (PROBIOTIC DAILY PO) Take 1 tablet by mouth daily.     . ranitidine (ZANTAC) 150 MG capsule Take 150 mg by mouth daily as needed (indigestion).    . saccharomyces boulardii (FLORASTOR) 250 MG capsule Take 250 mg by mouth 2 (two) times daily.    . simethicone (MYLICON) 643 MG chewable tablet Chew 125 mg by mouth 3 (three) times daily before meals.    Marland Kitchen trimethoprim (TRIMPEX) 100 MG tablet Take 100 mg by mouth daily.    Marland Kitchen UNABLE TO FIND Med Name: neomycin    . Vitamin D, Ergocalciferol, (DRISDOL) 50000  units CAPS capsule Take 50,000 Units by mouth every 7 (seven) days.     No current facility-administered medications for this visit.     PHYSICAL EXAMINATION: ECOG PERFORMANCE STATUS: 1 - Symptomatic but completely ambulatory  Vitals:   12/19/16 1402  BP: (!) 148/54  Pulse: 75  Resp: 18  Temp: 98.4 F (36.9 C)   Filed Weights   12/19/16 1402  Weight: 150 lb 1.6 oz (68.1 kg)    GENERAL:alert, no distress and comfortable SKIN: skin color, texture, turgor are normal, no rashes or significant lesions EYES: normal, Conjunctiva are pink and non-injected, sclera clear OROPHARYNX:no exudate, no erythema and lips, buccal mucosa, and tongue normal  NECK: supple, thyroid normal size, non-tender, without nodularity LYMPH:  no palpable lymphadenopathy in the cervical, axillary or inguinal LUNGS: clear to auscultation and percussion with normal breathing effort HEART: regular rate & rhythm and no murmurs and no lower extremity edema ABDOMEN:abdomen soft, non-tender and normal bowel  sounds MUSCULOSKELETAL:no cyanosis of digits and no clubbing  NEURO: alert & oriented x 3 with fluent speech, no focal motor/sensory deficits EXTREMITIES: No lower extremity edema  LABORATORY DATA:  I have reviewed the data as listed   Chemistry      Component Value Date/Time   NA 139 05/31/2016 1109   K 4.3 05/31/2016 1109   CL 103 02/14/2016 0414   CO2 27 05/31/2016 1109   BUN 22.3 05/31/2016 1109   CREATININE 1.40 (H) 12/10/2016 1519   CREATININE 1.3 (H) 05/31/2016 1109   GLU 178 10/27/2015      Component Value Date/Time   CALCIUM 10.3 05/31/2016 1109   ALKPHOS 117 05/31/2016 1109   AST 14 05/31/2016 1109   ALT 8 05/31/2016 1109   BILITOT 0.26 05/31/2016 1109       Lab Results  Component Value Date   WBC 7.5 05/31/2016   HGB 11.3 (L) 05/31/2016   HCT 35.4 05/31/2016   MCV 79.7 05/31/2016   PLT 208 05/31/2016   NEUTROABS 5.2 05/31/2016    ASSESSMENT & PLAN:  Adenocarcinoma of liver  s/p partial right hepatic lobectomy 06/14/2015 Liver partial resection segment 7 and 8 06/14/2015: Poorly differentiated carcinoma 8.6 cm, margins negative,positive for ck8/18, ck 7, partially positive for ck20, polyclonalCEA, cd10 and negative for HEPAR, AFP, CEA, WT-1, cdx-2, ER, PR, GCDFP and TTF-1) Cancer type ID: 96% pancreaticobiliary origin, 60% cholangiocarcinoma, 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  Sigmoid colectomy 02/09/2016 for recurrent diverticulitis with chronic strictures, 4lymph nodes negative  CT abdomen 11/28/2016: New Right hepatic lobe liver lesion worrisome for recurrent tumor or new metastatic disease measuring 4 x 3 x 2.4 cm Liver MRI 12/10/2016: Recurrent tumor posterior to the liver resection new masses with central necrosis 21 cm Based on extensive discussions with Dr. Barry Velasquez, the recommendation is not to perform surgery but to consider treating her with stereotactic radiosurgery. The radiation is likely to start on 01/01/2017  Subsequently we will watch and monitor her disease with the periodic scans. Patient hates undergoing CT scans because she does not like to drink the contrast.  Return to clinic in 3 months for follow-up  I spent 25 minutes talking to the patient of which more than half was spent in counseling and coordination of care.  No orders of the defined types were placed in this encounter.  The patient has a good understanding of the overall plan. she agrees with it. she will call with any problems that may develop before the next visit here.   Rulon Eisenmenger, MD 12/19/16

## 2016-12-19 NOTE — Progress Notes (Signed)
Please see the Nurse Progress Note in the MD Initial Consult Encounter for this patient. 

## 2016-12-19 NOTE — Progress Notes (Signed)
Radiation Oncology         [phone]) [phone] ________________________________  Name: Destiny Velasquez MRN: [id number]  Date: 12/19/2016  DOB: 10-Mar-1926  OI:NOMVEHM, [name]  [name], MD     REFERRING PHYSICIAN: [name], MD   DIAGNOSIS: The encounter diagnosis was Adenocarcinoma of liver ([place]).   HISTORY OF PRESENT ILLNESS: Destiny Velasquez is a 83 y.o. female seen at the request of Dr. Barry Dienes. Patient presented to the ED on 05/03/15 for a 2 week history of lower abdominal pain. CT scan of the abdomen showed a bilobed 8.1 cm mass in the liver with 3.8 cm of fluid collection on 05/03/15. Initial diagnosis was poorly differentiated carcinoma. PET scan on 05/17/15 showed a hypermetabolic partially necrotic right liver lobe mass, and hypermetabolic within the descending/sigmoid colon junction in the region of inflammation/ diverticulitis versus cancer. Partial liver resection of segment 7 and 8 on 06/14/15 showed poorly differentiated carcinoma, 8.6 cm, with negative margins. She underwent a sigmoid colectomy for recurrent diverticulitis with chronic stricture on 02/09/16. There was no evidence of malignancy and 4 lymph nodes were negative. She has been followed ins surveillance, but started having trouble with symptoms of diverticulitis. She underwent repeat staging scan on 11/28/16 revealed a new right hepatic lesion measuring 4 x 3 x 2.4 cm, worrisome for malignancy. MRI of the liver on 12/10/16 showed recurrent tumor posteriorly in the liver near the resection site with new masses centrally necrotic with a 21 cubic cm volume. She was seen by Dr. Barry Dienes on 12/12/16 and was counseled on the role of radiotherapy to treat this after discussing her case in GI conference.  PREVIOUS RADIATION THERAPY: No   PAST MEDICAL HISTORY:  Past Medical History:  Diagnosis Date  . Anal and rectal polyp   . Breast cancer, right breast ([place]) 1971   "never had chemo or radiation"  . Depression   . Disorder of  bone and cartilage, unspecified   . Diverticulitis of large intestine without perforation or abscess without bleeding   . Diverticulosis of colon (without mention of hemorrhage)   . Essential and other specified forms of tremor   . Family history of adverse reaction to anesthesia    daughter and grandson are slow to wake up  . Frequent UTI   . GERD (gastroesophageal reflux disease)   . Hyperlipidemia   . Hypertension   . Liver cancer ([place]) 2016  . Malignant neoplasm of breast (female), unspecified site   . Pneumonia X 1  . Stroke [facility])    after liver surgery - in 2016 no lasting weakness. does walk with walker  . Subacute delirium   . Thyroid disease   . Unspecified constipation   . Unspecified hypothyroidism        PAST SURGICAL HISTORY: Past Surgical History:  Procedure Laterality Date  . ABDOMINAL HYSTERECTOMY    . APPENDECTOMY    . BREAST BIOPSY Right 1971  . CATARACT EXTRACTION W/ INTRAOCULAR LENS  IMPLANT, BILATERAL Bilateral   . CHOLECYSTECTOMY    . CYSTOSCOPY W/ URETERAL STENT PLACEMENT Bilateral 02/09/2016   Procedure: CYSTOSCOPY WITH RETROGRADE PYELOGRAM/ BILATERAL TEMPORARY INDWELLING URETERAL STENT PLACEMENT;  Surgeon: Ardis Hughs, MD;  Location: [facility];  Service: Urology;  Laterality: Bilateral;  . LAPAROSCOPY N/A 06/14/2015   Procedure: LAPAROSCOPY DIAGNOSTIC;  Surgeon: [name], MD;  Location: [facility];  Service: General;  Laterality: N/A;  . LIVER BIOPSY  06/14/2015   Procedure: LIVER BIOPSY;  Surgeon: [name], MD;  Location: [facility];  Service: General;;  . MASTECTOMY Right 1971  . OPEN PARTIAL HEPATECTOMY  N/A 06/14/2015   Procedure:  OPEN PARTIAL RIGHT HEPATECTOMY;  Surgeon: [name], MD;  Location: [facility];  Service: General;  Laterality: N/A;  converted to open @ 1447  . PARTIAL COLECTOMY N/A 02/09/2016   Procedure: OPEN SIGMOID  COLECTOMY WITH REPAIR COLOVESICAL AND COLOVAGINAL FISTULA;  Surgeon: [name], MD;  Location: [facility];  Service:  General;  Laterality: N/A;  . RECTAL POLYPECTOMY  early 2000's  . TONSILLECTOMY AND ADENOIDECTOMY  1935  . TUBAL LIGATION       FAMILY HISTORY:  Family History  Problem Relation Age of Onset  . Cancer Mother   . Hypertension Father      SOCIAL HISTORY:  reports that she quit smoking about 4 years ago. Her smoking use included Cigarettes. She has a 20.00 pack-year smoking history. She has never used smokeless tobacco. She reports that she drinks alcohol. She reports that she does not use drugs. The patient is married and lives in a retirement community. She is originally from [place]. She is active and exercises multiple times per week.   ALLERGIES: Levaquin [levofloxacin]   MEDICATIONS:  Current Outpatient Prescriptions  Medication Sig Dispense Refill  . acetaminophen (TYLENOL) 325 MG tablet Take 650 mg by mouth every 6 (six) hours as needed (pain).     Marland Kitchen amLODipine (NORVASC) 5 MG tablet Take 5 mg by mouth daily.    . Cranberry (ELLURA) 200 MG CAPS Take 200 mg by mouth daily.    . fluticasone (FLONASE) 50 MCG/ACT nasal spray Place 2 sprays into both nostrils daily.    . iron polysaccharides (NIFEREX) 150 MG capsule Take 150 mg by mouth daily.    . Lactobacillus-Inulin ([place]) CAPS Take 1 capsule by mouth daily.    Marland Kitchen levothyroxine (SYNTHROID, LEVOTHROID) 100 MCG tablet Take 100 mcg by mouth daily before breakfast.    . loperamide (IMODIUM A-D) 2 MG tablet Take 2 mg by mouth as needed for diarrhea or loose stools.    . Magnesium 100 MG CAPS Take 100 mg by mouth daily.    . pantoprazole (PROTONIX) 40 MG tablet Take 40 mg by mouth 2 (two) times daily before a meal.    . polyethylene glycol (MIRALAX / GLYCOLAX) packet Take 17 g by mouth daily.    . Probiotic Product (PROBIOTIC DAILY PO) Take 1 tablet by mouth daily.     . ranitidine (ZANTAC) 150 MG capsule Take 150 mg by mouth daily as needed (indigestion).    . saccharomyces boulardii (FLORASTOR) 250 MG capsule  Take 250 mg by mouth 2 (two) times daily.    . simethicone (MYLICON) 376 MG chewable tablet Chew 125 mg by mouth 3 (three) times daily before meals.    Marland Kitchen trimethoprim (TRIMPEX) 100 MG tablet Take 100 mg by mouth daily.    . Vitamin D, Ergocalciferol, (DRISDOL) 50000 units CAPS capsule Take 50,000 Units by mouth every 7 (seven) days.    Marland Kitchen aspirin 81 MG tablet Take 81 mg by mouth daily.     . bisacodyl (DULCOLAX) 5 MG EC tablet Take 5 mg by mouth once. Took 4 times for bowel prep    . buPROPion (WELLBUTRIN XL) 150 MG 24 hr tablet Take 150 mg by mouth daily.    . feeding supplement (BOOST / RESOURCE BREEZE) LIQD Take 1 Container by mouth 2 (two) times daily between meals.     Marland Kitchen HYDROcodone-acetaminophen (NORCO/VICODIN) 5-325  MG tablet Take 1-2 tablets by mouth every 6 (six) hours as needed for moderate pain. (Patient not taking: Reported on 12/19/2016) 30 tablet 0  . hydrocortisone (ANUSOL-HC) 2.5 % rectal cream Apply 1 application topically 4 (four) times daily as needed for hemorrhoids. (Patient not taking: Reported on 12/19/2016) 30 g 0  . LORazepam (ATIVAN) 0.5 MG tablet Take 0.5 mg by mouth 2 (two) times daily as needed for anxiety.     . ondansetron (ZOFRAN) 4 MG tablet Take 4 mg by mouth daily as needed for nausea or vomiting.    Marland Kitchen UNABLE TO FIND Med Name: neomycin     No current facility-administered medications for this encounter.      REVIEW OF SYSTEMS: On review of systems, the patient reports that she is doing well overall. She denies any chest pain, shortness of breath, cough, fevers, chills, night sweats, unintended weight changes. She reports she is on an antibiotic for a UTI right now. She also notes nocturia. She manages her bowel movements with Miralax daily. She denies any abdominal pain, nausea or vomiting. She denies any new musculoskeletal or joint aches or pains. A complete review of systems is obtained and is otherwise negative.   PHYSICAL EXAM:  Wt Readings from Last 3  Encounters:  12/19/16 147 lb 12.8 oz (67 kg)  11/29/16 149 lb 11.2 oz (67.9 kg)  05/31/16 133 lb 8 oz (60.6 kg)   Temp Readings from Last 3 Encounters:  12/19/16 98.3 F (36.8 C) (Oral)  11/29/16 97.8 F (36.6 C) (Oral)  05/31/16 98.1 F (36.7 C) (Oral)   BP Readings from Last 3 Encounters:  12/19/16 (!) 141/54  11/29/16 (!) 139/45  05/31/16 (!) 135/50   Pulse Readings from Last 3 Encounters:  12/19/16 72  11/29/16 72  05/31/16 70   Pain Assessment Pain Score: 0-No pain/10  In general this is a well appearing caucasian woman in no acute distress. She is alert and oriented x4 and appropriate throughout the examination. HEENT reveals that the patient is normocephalic, atraumatic. EOMs are intact. PERRLA. Skin is intact without any evidence of gross lesions. Cardiovascular exam reveals a regular rate and rhythm, no clicks rubs or murmurs are auscultated. Chest is clear to auscultation bilaterally. Lymphatic assessment is performed and does not reveal any adenopathy in the cervical, supraclavicular, axillary, or inguinal chains. Abdomen has active bowel sounds in all quadrants and is intact. The abdomen is soft, non tender, non distended. Well-healed chevron incision in the upper abdomen is noted. Lower extremities are negative for  deep calf tenderness, cyanosis or clubbing. Trace edema is noted of her left ankle.   ECOG = 0  0 - Asymptomatic (Fully active, able to carry on all predisease activities without restriction)  1 - Symptomatic but completely ambulatory (Restricted in physically strenuous activity but ambulatory and able to carry out work of a light or sedentary nature. For example, light housework, office work)  2 - Symptomatic, <50% in bed during the day (Ambulatory and capable of all self care but unable to carry out any work activities. Up and about more than 50% of waking hours)  3 - Symptomatic, >50% in bed, but not bedbound (Capable of only limited self-care,  confined to bed or chair 50% or more of waking hours)  4 - Bedbound (Completely disabled. Cannot carry on any self-care. Totally confined to bed or chair)  5 - Death   Eustace Pen MM, Creech RH, Tormey DC, et al. [phone]). "Toxicity and response criteria of  the [facility]". [place] Oncol. 5 (6): 649-55    LABORATORY DATA:  Lab Results  Component Value Date   WBC 7.5 05/31/2016   HGB 11.3 (L) 05/31/2016   HCT 35.4 05/31/2016   MCV 79.7 05/31/2016   PLT 208 05/31/2016   Lab Results  Component Value Date   NA 139 05/31/2016   K 4.3 05/31/2016   CL 103 02/14/2016   CO2 27 05/31/2016   Lab Results  Component Value Date   ALT 8 05/31/2016   AST 14 05/31/2016   ALKPHOS 117 05/31/2016   BILITOT 0.26 05/31/2016      RADIOGRAPHY: Ct Abdomen Pelvis W Contrast  Result Date: 11/28/2016 CLINICAL DATA:  Restaging metastatic liver disease. EXAM: CT ABDOMEN AND PELVIS WITH CONTRAST TECHNIQUE: Multidetector CT imaging of the abdomen and pelvis was performed using the standard protocol following bolus administration of intravenous contrast. CONTRAST:  133m ISOVUE-300 IOPAMIDOL (ISOVUE-300) INJECTION 61% COMPARISON:  CT scan 01/26/2016 FINDINGS: Lower chest: The lung bases are clear of acute process. No worrisome pulmonary lesions. The heart is normal in size. No pericardial effusion. Small hiatal hernia. Hepatobiliary: Stable surgical changes involving the liver status post partial right hepatectomy. There is a new lesion in the right hepatic lobe located just above the staple line. This is worrisome for recurrent tumor or new metastatic disease. It measures approximately 4.0 x 3.0 x 2.4 cm. No other definite hepatic lesions. No intrahepatic biliary dilatation. Stable common bile duct dilatation. Pancreas: No mass, inflammation or ductal dilatation. Spleen: Normal size. Numerous calcified granulomas. No worrisome lesions. Heavily calcified splenic artery noted. Adrenals/Urinary  Tract: The adrenal glands and kidneys are unremarkable and stable. There are small bilateral renal calculi and small renal cysts. Mild uniform bladder wall thickening may be due to partial bladder outlet obstruction or cystitis. Stomach/Bowel: The stomach, duodenum, small bowel and colon are grossly normal. No inflammatory changes, mass lesions or obstructive findings. Large amount of stool throughout the colon suggesting constipation. The terminal ileum is normal. Vascular/Lymphatic: Stable advanced atherosclerotic calcifications involving the aorta and branch vessel ostia. No aneurysm or dissection. The major venous structures are patent. No mesenteric or retroperitoneal mass or lymphadenopathy. Stable subdiaphragmatic lymph node on image number 5 measuring 12 mm. Reproductive: The uterus is surgically absent. The right ovary is still present and appears normal. I do not see the left ovary for certain. Other: No pelvic mass or adenopathy. No free pelvic fluid collections. No inguinal mass or adenopathy. No abdominal wall hernia or subcutaneous lesions. Musculoskeletal: No significant osseous findings. Stable degenerative anterolisthesis of L4 and associated advanced degenerate disc disease at L4-5. IMPRESSION: 1. New right hepatic lobe liver lesion worrisome for recurrent tumor or new metastatic disease. 2. Stable common bile duct dilatation. 3. No acute abdominal findings or lymphadenopathy. 4. Stable advanced atherosclerotic calcifications involving the aorta and branch vessels. 5. Large amount of stool throughout the colon suggesting constipation. 6. Stable bilateral renal calculi. Electronically Signed   By: PMarijo SanesM.D.   On: 11/28/2016 12:17   Mr Liver W Wo Contrast  Result Date: 12/10/2016 CLINICAL DATA:  Right hepatic lobe poorly differentiated carcinoma, status post resection, with recurrent mass seen on CT abdomen 01/26/2016. EXAM: MRI ABDOMEN WITHOUT AND WITH CONTRAST TECHNIQUE: Multiplanar  multisequence MR imaging of the abdomen was performed both before and after the administration of intravenous contrast. CONTRAST:  732mMULTIHANCE GADOBENATE DIMEGLUMINE 529 MG/ML IV SOLN COMPARISON:  Multiple exams, including 05/03/2015 and 11/28/2016 FINDINGS: Lower  chest: Mild cardiomegaly. Small pericardial effusion. A pericardial lymph node measures 1.2 cm in short axis on image 27/903. Hepatobiliary: Prior partial hepatectomy in the right hepatic lobe. Along the posterior margin of the residual right liver, there is a 3.8 by 2.8 by 3.7 cm (volume = 21 cm^3) mass with high T2 and low T1 signal characteristics, arterial phase rim enhancement, and central necrosis, highly suspicious for recurrent malignancy. This partially abuts the right posterior hemidiaphragm and is subcapsular in location and adjacent to the resection margin. There clips node along the anterior inferior margin of the mass. Along the medial margin of the clips the resection margin of the liver, on image 52/901, there is a 1.4 by 0.7 cm focus of 2 accentuated arterial phase enhancement which could conceivably reflect some localized fibrosis, but I cannot exclude a small secondary site of tumor at this location. Pancreas:  Unremarkable Spleen:  Unremarkable Adrenals/Urinary Tract: Several small benign-appearing renal cysts are present. Adrenal glands unremarkable. Stomach/Bowel: Unremarkable Vascular/Lymphatic: Abdominal aortic atherosclerotic calcification. No definite regional adenopathy. Other:  No supplemental non-categorized findings. Musculoskeletal: Subluxation and degenerative disc disease at L4-5, likely impingement at this level. IMPRESSION: 1. Recurrent tumor posteriorly in the liver near the resection site, the new masses centrally necrotic has a volume of about 21 cubic cm. Questionable second focus of tumor just medial to the adjacent vascular clips on image 52/901, although this focal arterial phase enhancement in the secondary  site could be due to fibrosis rather than a second focus of tumor. 2. Mildly prominent pericardial lymph node at 1.2 cm in short axis. 3. Mild cardiomegaly and small pericardial effusion. 4.  Aortic Atherosclerosis (ICD10-I70.0). 5. Suspected impingement at L4-5 due to spondylosis and degenerative disc disease. Electronically Signed   By: Van Clines M.D.   On: 12/10/2016 16:00       IMPRESSION/PLAN: 1. Recurrent adenocarcinoma of the liver versus intrahepatic cholangiocarcinoma. Dr. Lisbeth Renshaw reviews the options of treatment for patients with recurrent liver tumor. Although we do not have tissue confirmation, the imaging findings of her tumor being seen along the previous resection margin is convincing for recurrence. The patient would be a good candidate for stereotactic body radiotherapy, anticipating 3-5 treatments. We discussed the course of treatment, side effects, and potential toxicities with the patient and her daughter. She appears to understand and wishes to proceed with treatment. Patient will proceed with SBRT CT simulation later this afternoon. Written consent is obtained and placed in her chart, a copy provided to the patient. She will proceed with simulation today at 1pm.   The above documentation reflects my direct findings during this shared patient visit. Please see the separate note by Dr. Lisbeth Renshaw on this date for the remainder of the patient's plan of care.    Carola Rhine, PAC This document serves as a record of services personally performed by Shona Simpson, PA-C and Kyung Rudd, MD. It was created on their behalf by Bethann Humble, a trained medical scribe. The creation of this record is based on the scribe's personal observations and the provider's statements to them. This document has been checked and approved by the attending provider.

## 2016-12-24 DIAGNOSIS — D649 Anemia, unspecified: Secondary | ICD-10-CM | POA: Diagnosis not present

## 2016-12-27 ENCOUNTER — Telehealth: Payer: Self-pay | Admitting: Radiation Oncology

## 2016-12-27 ENCOUNTER — Ambulatory Visit: Payer: Medicare Other | Admitting: Hematology and Oncology

## 2016-12-27 NOTE — Telephone Encounter (Signed)
I spoke with the patient's daughter and with the patient regarding concerns about the position of her tattoos. We discussed the plan for her treatment and she will call us with other questions.

## 2016-12-28 DIAGNOSIS — C787 Secondary malignant neoplasm of liver and intrahepatic bile duct: Secondary | ICD-10-CM | POA: Diagnosis not present

## 2016-12-28 DIAGNOSIS — Z510 Encounter for antineoplastic radiation therapy: Secondary | ICD-10-CM | POA: Diagnosis not present

## 2016-12-28 DIAGNOSIS — Z9049 Acquired absence of other specified parts of digestive tract: Secondary | ICD-10-CM | POA: Diagnosis not present

## 2016-12-28 DIAGNOSIS — C229 Malignant neoplasm of liver, not specified as primary or secondary: Secondary | ICD-10-CM | POA: Diagnosis not present

## 2016-12-28 DIAGNOSIS — F329 Major depressive disorder, single episode, unspecified: Secondary | ICD-10-CM | POA: Diagnosis not present

## 2016-12-28 DIAGNOSIS — Z8509 Personal history of malignant neoplasm of other digestive organs: Secondary | ICD-10-CM | POA: Diagnosis not present

## 2016-12-28 DIAGNOSIS — Z853 Personal history of malignant neoplasm of breast: Secondary | ICD-10-CM | POA: Diagnosis not present

## 2017-01-01 ENCOUNTER — Ambulatory Visit
Admission: RE | Admit: 2017-01-01 | Discharge: 2017-01-01 | Disposition: A | Discharge status: Home / Self Care | Payer: Medicare Other | Source: Ambulatory Visit | Attending: Radiation Oncology | Admitting: Radiation Oncology

## 2017-01-01 DIAGNOSIS — C787 Secondary malignant neoplasm of liver and intrahepatic bile duct: Secondary | ICD-10-CM | POA: Diagnosis not present

## 2017-01-01 DIAGNOSIS — Z510 Encounter for antineoplastic radiation therapy: Secondary | ICD-10-CM | POA: Diagnosis not present

## 2017-01-01 DIAGNOSIS — Z853 Personal history of malignant neoplasm of breast: Secondary | ICD-10-CM | POA: Diagnosis not present

## 2017-01-01 DIAGNOSIS — Z8509 Personal history of malignant neoplasm of other digestive organs: Secondary | ICD-10-CM | POA: Diagnosis not present

## 2017-01-01 DIAGNOSIS — Z9049 Acquired absence of other specified parts of digestive tract: Secondary | ICD-10-CM | POA: Diagnosis not present

## 2017-01-01 DIAGNOSIS — F329 Major depressive disorder, single episode, unspecified: Secondary | ICD-10-CM | POA: Diagnosis not present

## 2017-01-02 ENCOUNTER — Ambulatory Visit: Payer: Medicare Other | Admitting: Radiation Oncology

## 2017-01-03 ENCOUNTER — Ambulatory Visit
Admission: RE | Admit: 2017-01-03 | Discharge: 2017-01-03 | Disposition: A | Discharge status: Home / Self Care | Payer: Medicare Other | Source: Ambulatory Visit | Attending: Radiation Oncology | Admitting: Radiation Oncology

## 2017-01-03 DIAGNOSIS — C787 Secondary malignant neoplasm of liver and intrahepatic bile duct: Secondary | ICD-10-CM | POA: Diagnosis not present

## 2017-01-03 DIAGNOSIS — Z510 Encounter for antineoplastic radiation therapy: Secondary | ICD-10-CM | POA: Diagnosis not present

## 2017-01-03 DIAGNOSIS — Z853 Personal history of malignant neoplasm of breast: Secondary | ICD-10-CM | POA: Diagnosis not present

## 2017-01-03 DIAGNOSIS — Z8509 Personal history of malignant neoplasm of other digestive organs: Secondary | ICD-10-CM | POA: Diagnosis not present

## 2017-01-03 DIAGNOSIS — F329 Major depressive disorder, single episode, unspecified: Secondary | ICD-10-CM | POA: Diagnosis not present

## 2017-01-03 DIAGNOSIS — Z9049 Acquired absence of other specified parts of digestive tract: Secondary | ICD-10-CM | POA: Diagnosis not present

## 2017-01-04 ENCOUNTER — Ambulatory Visit: Payer: Medicare Other | Admitting: Radiation Oncology

## 2017-01-08 ENCOUNTER — Ambulatory Visit
Admission: RE | Admit: 2017-01-08 | Discharge: 2017-01-08 | Disposition: A | Discharge status: Home / Self Care | Payer: Medicare Other | Source: Ambulatory Visit | Attending: Radiation Oncology | Admitting: Radiation Oncology

## 2017-01-08 DIAGNOSIS — R339 Retention of urine, unspecified: Secondary | ICD-10-CM | POA: Diagnosis not present

## 2017-01-09 ENCOUNTER — Encounter: Payer: Self-pay | Admitting: Radiation Oncology

## 2017-01-09 ENCOUNTER — Ambulatory Visit
Admission: RE | Admit: 2017-01-09 | Discharge: 2017-01-09 | Disposition: A | Discharge status: Home / Self Care | Payer: Medicare Other | Source: Ambulatory Visit | Attending: Radiation Oncology | Admitting: Radiation Oncology

## 2017-01-09 DIAGNOSIS — Z510 Encounter for antineoplastic radiation therapy: Secondary | ICD-10-CM | POA: Diagnosis not present

## 2017-01-09 DIAGNOSIS — F329 Major depressive disorder, single episode, unspecified: Secondary | ICD-10-CM | POA: Diagnosis not present

## 2017-01-09 DIAGNOSIS — Z8509 Personal history of malignant neoplasm of other digestive organs: Secondary | ICD-10-CM | POA: Diagnosis not present

## 2017-01-09 DIAGNOSIS — C787 Secondary malignant neoplasm of liver and intrahepatic bile duct: Secondary | ICD-10-CM | POA: Diagnosis not present

## 2017-01-09 DIAGNOSIS — C229 Malignant neoplasm of liver, not specified as primary or secondary: Secondary | ICD-10-CM | POA: Diagnosis not present

## 2017-01-09 DIAGNOSIS — Z9049 Acquired absence of other specified parts of digestive tract: Secondary | ICD-10-CM | POA: Diagnosis not present

## 2017-01-09 DIAGNOSIS — Z853 Personal history of malignant neoplasm of breast: Secondary | ICD-10-CM | POA: Diagnosis not present

## 2017-01-09 NOTE — Progress Notes (Signed)
  Radiation Oncology         [phone]) [phone] ________________________________  Name: Destiny Velasquez MRN: [id number]  Date: 12/19/2016  DOB: [birth year]  RESPIRATORY MOTION MANAGEMENT SIMULATION  NARRATIVE:  In order to account for effect of respiratory motion on target structures and other organs in the planning and delivery of radiotherapy, this patient underwent respiratory motion management simulation.  To accomplish this, when the patient was brought to the CT simulation planning suite, 4D respiratoy motion management CT images were obtained.  The CT images were loaded into the planning software.  Then, using a variety of tools including Cine, MIP, and standard views, the target volume and planning target volumes (PTV) were delineated.  Avoidance structures were contoured.  Treatment planning then occurred.  Dose volume histograms were generated and reviewed for each of the requested structure.  The resulting plan was carefully reviewed and approved today.   ------------------------------------------------  Jodelle Gross, MD, PhD

## 2017-01-09 NOTE — Progress Notes (Signed)
[facility] Simulation and Treatment Planning Note   Name:  Destiny Velasquez MRN: [id number]   Date: 01/09/2017  DOB: July 23, 1926  Status:outpatient    DIAGNOSIS:    ICD-9-CM ICD-10-CM   1. Liver metastasis ([place]) 197.7 C78.7      CONSENT VERIFIED:yes   SET UP: Patient is setup supine   IMMOBILIZATION: The patient was immobilized using a Vac Loc bag.   NARRATIVE:The patient was brought to the [facility].  Identity was confirmed.  All relevant records and images related to the planned course of therapy were reviewed.  Then, the patient was positioned in a stable reproducible clinical set-up for radiation therapy. Abdominal compression was applied by me.  4D CT images were obtained and reproducible breathing pattern was confirmed. Free breathing CT images were obtained.  Skin markings were placed.  The CT images were loaded into the planning software where the target and avoidance structures were contoured.  The radiation prescription was entered and confirmed.    TREATMENT PLANNING NOTE:  Treatment planning then occurred. I have requested : MLC's, isodose plan, basic dose calculation.  3 dimensional simulation is performed and dose volume histogram of the gross tumor volume, planning tumor volume and criticial normal structures including the spinal cord and lungs were analyzed and requested.  Special treatment procedure was performed due to high dose per fraction.  The patient will be monitored for increased risk of toxicity.  Daily imaging using cone beam CT will be used for target localization.  I anticipate that the patient will receive 54 Gy in 3 fractions to target volume. Further adjustments will be made based on the planning process is necessary.  ------------------------------------------------  Jodelle Gross, MD, PhD

## 2017-01-10 ENCOUNTER — Ambulatory Visit: Payer: Medicare Other | Admitting: Radiation Oncology

## 2017-01-15 ENCOUNTER — Ambulatory Visit: Payer: Medicare Other | Admitting: Radiation Oncology

## 2017-01-15 NOTE — Progress Notes (Signed)
°  Radiation Oncology         [phone]) [phone] ________________________________  Name: Destiny Velasquez MRN: [id number]  Date: 01/09/2017  DOB: [birth year]  End of Treatment Note  Diagnosis:   Recurrent adenocarcinoma of the liver versus intrahepatic cholangiocarcinoma  Indication for treatment:  Curative  Radiation treatment dates:   01/01/17 - 01/09/17  Site/dose:  Liver: 54 Gy in 3 fractions  Beams/energy:   SBRT/SRT-3D // 10X FFF Photon  Narrative: The patient tolerated radiation treatment relatively well. The patient's only complaint was a 4 day history of a sore throat.  Plan: The patient has completed radiation treatment. The patient will return to radiation oncology clinic for routine followup in one month. I advised them to call or return sooner if they have any questions or concerns related to their recovery or treatment.  ------------------------------------------------  Jodelle Gross, MD, PhD  This document serves as a record of services personally performed by Kyung Rudd, MD. It was created on his behalf by Darcus Austin, a trained medical scribe. The creation of this record is based on the scribe's personal observations and the provider's statements to them. This document has been checked and approved by the attending provider.

## 2017-01-28 DIAGNOSIS — N3020 Other chronic cystitis without hematuria: Secondary | ICD-10-CM | POA: Diagnosis not present

## 2017-01-29 ENCOUNTER — Ambulatory Visit
Admission: RE | Admit: 2017-01-29 | Discharge: 2017-01-29 | Disposition: A | Discharge status: Home / Self Care | Payer: Medicare Other | Source: Ambulatory Visit | Attending: Radiation Oncology | Admitting: Radiation Oncology

## 2017-01-29 VITALS — BP 140/57 | HR 80 | Temp 98.5°F | Resp 20 | Ht 63.0 in | Wt 147.8 lb

## 2017-01-29 DIAGNOSIS — Z9049 Acquired absence of other specified parts of digestive tract: Secondary | ICD-10-CM | POA: Diagnosis not present

## 2017-01-29 DIAGNOSIS — C787 Secondary malignant neoplasm of liver and intrahepatic bile duct: Secondary | ICD-10-CM

## 2017-01-29 DIAGNOSIS — Z853 Personal history of malignant neoplasm of breast: Secondary | ICD-10-CM | POA: Diagnosis not present

## 2017-01-29 DIAGNOSIS — Z8509 Personal history of malignant neoplasm of other digestive organs: Secondary | ICD-10-CM | POA: Diagnosis not present

## 2017-01-29 DIAGNOSIS — F329 Major depressive disorder, single episode, unspecified: Secondary | ICD-10-CM | POA: Diagnosis not present

## 2017-01-29 DIAGNOSIS — Z510 Encounter for antineoplastic radiation therapy: Secondary | ICD-10-CM | POA: Diagnosis not present

## 2017-01-29 DIAGNOSIS — C220 Liver cell carcinoma: Secondary | ICD-10-CM

## 2017-01-29 NOTE — Addendum Note (Signed)
Encounter addended by: Doreen Beam, RN on: 01/29/2017 10:20 AM<BR>    Actions taken: Charge Capture section accepted

## 2017-01-29 NOTE — Progress Notes (Signed)
Follow up SBRT Liver 01/01/17-01/09/17 No c/o pain or nausea, bowels normal, no blader issues,  Residing at [facility], , A^ O x 3 weight and vitasl stablwe 8:38 AM BP (!) 140/57   Pulse 80   Temp 98.5 F (36.9 C) (Oral)   Resp 20   Ht 5\' 3"  (1.6 m)   Wt 147 lb 12.8 oz (67 kg)   SpO2 98% Comment: room air  BMI 26.18 kg/m   Wt Readings from Last 3 Encounters:  01/29/17 147 lb 12.8 oz (67 kg)  12/19/16 147 lb 12.8 oz (67 kg)  12/19/16 150 lb 1.6 oz (68.1 kg)

## 2017-01-29 NOTE — Progress Notes (Signed)
Radiation Oncology         [phone]) [phone] ________________________________  Name: Destiny Velasquez MRN: [id number]  Date: 01/29/2017  DOB: 05-29-1926  Post Treatment Note  CC: Patient, [name]  [name], MD  Diagnosis:   Recurrent adenocarcinoma of the liver versus intrahepatic cholangiocarcinoma   Interval Since Last Radiation:  3 weeks   01/01/17 - 01/09/17 SBRT Treatment: Liver lesion was treated to 54 Gy in 3 fractions   Narrative:  The patient returns today for routine follow-up. She tolerated radiotherapy quite well. The first treatment she noticed weakness and felt slightly jittery. This did not continue for the remainder of treatment.                            On review of systems, the patient states she feels great. She denies any difficulty with abdominal pain, bowel dysfunction, skin changes, or nausea. No other complaints are noted.  ALLERGIES:  is allergic to levaquin [levofloxacin].  Meds: Current Outpatient Prescriptions  Medication Sig Dispense Refill  . acetaminophen (TYLENOL) 325 MG tablet Take 650 mg by mouth every 6 (six) hours as needed (pain).     Marland Kitchen amLODipine (NORVASC) 5 MG tablet Take 5 mg by mouth daily.    Marland Kitchen aspirin 81 MG tablet Take 81 mg by mouth daily.     Marland Kitchen buPROPion (WELLBUTRIN XL) 150 MG 24 hr tablet Take 150 mg by mouth daily.    . Cranberry (ELLURA) 200 MG CAPS Take 200 mg by mouth daily.    . iron polysaccharides (NIFEREX) 150 MG capsule Take 150 mg by mouth daily.    . Lactobacillus-Inulin (Cascade) CAPS Take 1 capsule by mouth daily.    Marland Kitchen levothyroxine (SYNTHROID, LEVOTHROID) 100 MCG tablet Take 100 mcg by mouth daily before breakfast.    . loperamide (IMODIUM A-D) 2 MG tablet Take 2 mg by mouth as needed for diarrhea or loose stools.    Marland Kitchen LORazepam (ATIVAN) 0.5 MG tablet Take 0.5 mg by mouth 2 (two) times daily as needed for anxiety.     . Magnesium 100 MG CAPS Take 100 mg by mouth daily.    . ondansetron (ZOFRAN) 4 MG  tablet Take 4 mg by mouth daily as needed for nausea or vomiting.    . pantoprazole (PROTONIX) 40 MG tablet Take 40 mg by mouth 2 (two) times daily before a meal.    . polyethylene glycol (MIRALAX / GLYCOLAX) packet Take 17 g by mouth daily.    . Probiotic Product (PROBIOTIC DAILY PO) Take 1 tablet by mouth daily.     . ranitidine (ZANTAC) 150 MG capsule Take 150 mg by mouth daily as needed (indigestion).    . saccharomyces boulardii (FLORASTOR) 250 MG capsule Take 250 mg by mouth 2 (two) times daily.    . simethicone (MYLICON) 097 MG chewable tablet Chew 125 mg by mouth 3 (three) times daily before meals.    Marland Kitchen trimethoprim (TRIMPEX) 100 MG tablet Take 100 mg by mouth daily.    Marland Kitchen UNABLE TO FIND Med Name: neomycin    . Vitamin D, Ergocalciferol, (DRISDOL) 50000 units CAPS capsule Take 50,000 Units by mouth every 7 (seven) days.    . bisacodyl (DULCOLAX) 5 MG EC tablet Take 5 mg by mouth once. Took 4 times for bowel prep    . feeding supplement (BOOST / RESOURCE BREEZE) LIQD Take 1 Container by mouth 2 (two) times daily between meals.     Marland Kitchen  fluticasone (FLONASE) 50 MCG/ACT nasal spray Place 2 sprays into both nostrils daily.    Marland Kitchen HYDROcodone-acetaminophen (NORCO/VICODIN) 5-325 MG tablet Take 1-2 tablets by mouth every 6 (six) hours as needed for moderate pain. (Patient not taking: Reported on 01/29/2017) 30 tablet 0  . hydrocortisone (ANUSOL-HC) 2.5 % rectal cream Apply 1 application topically 4 (four) times daily as needed for hemorrhoids. (Patient not taking: Reported on 01/29/2017) 30 g 0   No current facility-administered medications for this encounter.     Physical Findings:  height is 5\' 3"  (1.6 m) and weight is 147 lb 12.8 oz (67 kg). Her oral temperature is 98.5 F (36.9 C). Her blood pressure is 140/57 (abnormal) and her pulse is 80. Her respiration is 20 and oxygen saturation is 98%.  Pain Assessment Pain Score: 0-No pain/10 In general this is a well appearing elderly caucasian female  in no acute distress. She's alert and oriented x4 and appropriate throughout the examination. Cardiopulmonary assessment is negative for acute distress and she exhibits normal effort.   Lab Findings: Lab Results  Component Value Date   WBC 7.5 05/31/2016   HGB 11.3 (L) 05/31/2016   HCT 35.4 05/31/2016   MCV 79.7 05/31/2016   PLT 208 05/31/2016     Radiographic Findings: No results found.  Impression/Plan: 1. Recurrent adenocarcinoma of the liver versus intrahepatic cholangiocarcinoma. The patient appears to be doing well following treatment. She's scheduled to see Dr. Lindi Adie in August. I will contact Dr. Barry Dienes and Dr. Lindi Adie regarding her care. We will see her back as needed moving forward, but she will call if she has questions or concerns regarding her previous treatment. 2. Hypertension. The patient has not taken her medication this morning, but will do so when returning home and have this rechecked at her residence.     Carola Rhine, PAC

## 2017-01-31 ENCOUNTER — Inpatient Hospital Stay: Admission: RE | Admit: 2017-01-31 | Payer: Self-pay | Source: Ambulatory Visit | Admitting: Radiation Oncology

## 2017-02-12 ENCOUNTER — Other Ambulatory Visit: Payer: Self-pay | Admitting: Hematology and Oncology

## 2017-02-12 ENCOUNTER — Other Ambulatory Visit: Payer: Self-pay

## 2017-02-12 DIAGNOSIS — C787 Secondary malignant neoplasm of liver and intrahepatic bile duct: Secondary | ICD-10-CM

## 2017-02-12 NOTE — Progress Notes (Signed)
Spoke with daughter regarding pt follow up 3 month scan of liver. Dr.Gudena wants to recheck mri liver in august prior to next ov. Will call pt this week to confirm appt of mri. Daughter is poa and will be going out of the country in the next week and would like to have this mri test set up before she leaves. No further questions/concerns at this time.

## 2017-02-14 ENCOUNTER — Other Ambulatory Visit: Payer: Self-pay

## 2017-02-14 ENCOUNTER — Telehealth: Payer: Self-pay

## 2017-02-14 DIAGNOSIS — C787 Secondary malignant neoplasm of liver and intrahepatic bile duct: Secondary | ICD-10-CM

## 2017-02-14 NOTE — Telephone Encounter (Signed)
Called and spoke with daughter   Destiny Velasquez) to confirm appt for mri of the liver in august prior to seeing Dr.Gudena. Pt will need to be NPO 4hrs prior to exam. Pt daughter confirmed time/date and instructions.

## 2017-02-14 NOTE — Telephone Encounter (Signed)
Spoke with pt daughter to let her know that the MRI of liver was cancelled and Dr.Gudena prefers to have a CT chest, abdomen pelvis instead. Pt to be npo 4 hrs prior to CT on 03/18/17 9:30. Take first bottle of contrast 730am and 2nd bottle at 830am. Pt daughter confirmed time/date over the phone. Pt will need to come at the cancer center and pick up her oral contrast. Pt daughter verbalized understanding.

## 2017-02-26 ENCOUNTER — Other Ambulatory Visit: Payer: Self-pay

## 2017-02-26 DIAGNOSIS — C787 Secondary malignant neoplasm of liver and intrahepatic bile duct: Secondary | ICD-10-CM

## 2017-03-01 ENCOUNTER — Telehealth: Payer: Self-pay | Admitting: Hematology and Oncology

## 2017-03-01 NOTE — Telephone Encounter (Signed)
lvm to inform pt of lab only appt 8/2 at 1130 per sch msg

## 2017-03-06 DIAGNOSIS — E039 Hypothyroidism, unspecified: Secondary | ICD-10-CM | POA: Diagnosis not present

## 2017-03-08 ENCOUNTER — Other Ambulatory Visit: Payer: Medicare Other

## 2017-03-14 ENCOUNTER — Other Ambulatory Visit: Payer: Medicare Other

## 2017-03-15 ENCOUNTER — Other Ambulatory Visit: Payer: Self-pay | Admitting: Adult Health

## 2017-03-15 DIAGNOSIS — C787 Secondary malignant neoplasm of liver and intrahepatic bile duct: Secondary | ICD-10-CM

## 2017-03-17 ENCOUNTER — Telehealth: Payer: Self-pay

## 2017-03-17 NOTE — Telephone Encounter (Signed)
Spoke with patients daughter and she is awqre of the new appt time due to call day  Blaine Hari

## 2017-03-18 ENCOUNTER — Ambulatory Visit (HOSPITAL_COMMUNITY)
Admission: RE | Admit: 2017-03-18 | Discharge: 2017-03-18 | Disposition: A | Discharge status: Home / Self Care | Payer: Medicare Other | Source: Ambulatory Visit | Attending: Hematology and Oncology | Admitting: Hematology and Oncology

## 2017-03-18 ENCOUNTER — Ambulatory Visit (HOSPITAL_COMMUNITY): Payer: Medicare Other

## 2017-03-18 ENCOUNTER — Encounter (HOSPITAL_COMMUNITY): Payer: Self-pay

## 2017-03-18 ENCOUNTER — Other Ambulatory Visit (HOSPITAL_BASED_OUTPATIENT_CLINIC_OR_DEPARTMENT_OTHER): Payer: Medicare Other

## 2017-03-18 DIAGNOSIS — C787 Secondary malignant neoplasm of liver and intrahepatic bile duct: Secondary | ICD-10-CM

## 2017-03-18 DIAGNOSIS — C227 Other specified carcinomas of liver: Secondary | ICD-10-CM | POA: Diagnosis present

## 2017-03-18 DIAGNOSIS — J90 Pleural effusion, not elsewhere classified: Secondary | ICD-10-CM | POA: Insufficient documentation

## 2017-03-18 DIAGNOSIS — I313 Pericardial effusion (noninflammatory): Secondary | ICD-10-CM | POA: Diagnosis not present

## 2017-03-18 DIAGNOSIS — R160 Hepatomegaly, not elsewhere classified: Secondary | ICD-10-CM | POA: Insufficient documentation

## 2017-03-18 DIAGNOSIS — I700 Atherosclerosis of aorta: Secondary | ICD-10-CM | POA: Insufficient documentation

## 2017-03-18 LAB — COMPREHENSIVE METABOLIC PANEL
ALK PHOS: 134 U/L (ref 40–150)
ALT: 17 U/L (ref 0–55)
AST: 22 U/L (ref 5–34)
Albumin: 3.2 g/dL — ABNORMAL LOW (ref 3.5–5.0)
Anion Gap: 7 mEq/L (ref 3–11)
BUN: 18.4 mg/dL (ref 7.0–26.0)
CHLORIDE: 104 meq/L (ref 98–109)
CO2: 27 mEq/L (ref 22–29)
Calcium: 11 mg/dL — ABNORMAL HIGH (ref 8.4–10.4)
Creatinine: 1.3 mg/dL — ABNORMAL HIGH (ref 0.6–1.1)
EGFR: 37 mL/min/{1.73_m2} — AB (ref >90)
GLUCOSE: 112 mg/dL (ref 70–140)
POTASSIUM: 4.3 meq/L (ref 3.5–5.1)
SODIUM: 137 meq/L (ref 136–145)
Total Bilirubin: 0.4 mg/dL (ref 0.20–1.20)
Total Protein: 7.6 g/dL (ref 6.4–8.3)

## 2017-03-18 LAB — CBC WITH DIFFERENTIAL/PLATELET
BASO%: 1.4 % (ref 0.0–2.0)
BASOS ABS: 0.1 10*3/uL (ref 0.0–0.1)
EOS ABS: 0.2 10*3/uL (ref 0.0–0.5)
EOS%: 2.8 % (ref 0.0–7.0)
HCT: 40.5 % (ref 34.8–46.6)
HGB: 13 g/dL (ref 11.6–15.9)
LYMPH%: 13.4 % — AB (ref 14.0–49.7)
MCH: 25.2 pg (ref 25.1–34.0)
MCHC: 32.1 g/dL (ref 31.5–36.0)
MCV: 78.4 fL — AB (ref 79.5–101.0)
MONO#: 0.6 10*3/uL (ref 0.1–0.9)
MONO%: 9.3 % (ref 0.0–14.0)
NEUT#: 4.7 10*3/uL (ref 1.5–6.5)
NEUT%: 73.1 % (ref 38.4–76.8)
Platelets: 170 10*3/uL (ref 145–400)
RBC: 5.17 10*6/uL (ref 3.70–5.45)
RDW: 19.1 % — ABNORMAL HIGH (ref 11.2–14.5)
WBC: 6.5 10*3/uL (ref 3.9–10.3)
lymph#: 0.9 10*3/uL (ref 0.9–3.3)

## 2017-03-18 MED ORDER — IOPAMIDOL (ISOVUE-300) INJECTION 61%
INTRAVENOUS | Status: AC
  Filled 2017-03-18: qty 100

## 2017-03-18 MED ORDER — IOPAMIDOL (ISOVUE-300) INJECTION 61%
100.0000 mL | Freq: Once | INTRAVENOUS | Status: AC | PRN
  Administered 2017-03-18: 80 mL via INTRAVENOUS

## 2017-03-21 ENCOUNTER — Encounter: Payer: Self-pay | Admitting: Hematology and Oncology

## 2017-03-21 ENCOUNTER — Ambulatory Visit (HOSPITAL_BASED_OUTPATIENT_CLINIC_OR_DEPARTMENT_OTHER): Payer: Medicare Other | Admitting: Hematology and Oncology

## 2017-03-21 DIAGNOSIS — C801 Malignant (primary) neoplasm, unspecified: Secondary | ICD-10-CM | POA: Diagnosis present

## 2017-03-21 DIAGNOSIS — C787 Secondary malignant neoplasm of liver and intrahepatic bile duct: Secondary | ICD-10-CM | POA: Diagnosis not present

## 2017-03-21 NOTE — Assessment & Plan Note (Signed)
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  Radiology review: No evidence of progression.  Repeat Ct in 6 months and follow up after with labs

## 2017-03-21 NOTE — Progress Notes (Signed)
Patient Care Team: Patient, [name] as PCP - General (General Practice) [name], MD as Consulting Physician (General Surgery)  DIAGNOSIS:  Encounter Diagnosis  Name Primary?  . Liver metastasis ([place])     SUMMARY OF ONCOLOGIC HISTORY:   Liver metastasis ([place])   05/03/2015 Imaging    CT abdomen: Bilobed 8.1 cm mass in the liver segment 5. 3.8 cm fluid collection posterior and medial: Favoring adnexal cyst      05/04/2015 Initial Diagnosis    Poorly differentiated carcinoma strongly positive for CK 8/18, CK 7 and MOC-31; HPAR faint cytoplasmic staining; (negative for ER, PR, AFP, TTF-1, WT 1, p63, CD10, CDX 2, CK 20, CK 5/6, mucicarmine)      05/17/2015 PET scan    Hypermetabolic partially necrotic right liver lobe mass , hypermetabolic some within the descending/sigmoid colon junction in the region of inflammation/ diverticulitis versus cancer      06/14/2015 Surgery    liver partial resection segment 7 and 8: Poorly differentiated carcinoma 8.6 cm, margins negative,positive for ck8/18, ck 7, partially positive for ck20, polyclonalCEA, cd10 and negative for HEPAR, AFP, Monoclonal CEA, WT-1, cdx-2, ER, PR, GCDFP and TTF      06/14/2015 Procedure    cancer type ID: 96% pancreaticobiliary origin, 60% cholangiocarcinoma, 36% gallbladder adenocarcinoma      02/09/2016 Surgery    Sigmoid colectomy for recurrent diverticulitis with chronic stricture; no malignancy 4 lymph nodes negative      11/28/2016 Relapse/Recurrence    New right hepatic lobe lesion worrisome for recurrent tumor 4 cm      01/01/2017 - 01/09/2017 Radiation Therapy    SBRT to liver      03/18/2017 Imaging    Hypodense liver lesion smaller (was 3.1 cm, now 2.8 cm), No other new metastatic lesions noted.        CHIEF COMPLIANT: Follow-up after recent CT scans  INTERVAL HISTORY: Destiny Velasquez is a 83 year old with above-mentioned history of liver metastatic disease we'll underwent a resection and had  recurrence. She then received SBRT to the liver. She had a recent CT scan and is here to discuss the results. She reports that her health has been great. She denies any major pains or discomfort. She does what she wants and has sufficient energy to do so. She does not require help with activities of daily living.  REVIEW OF SYSTEMS:   Constitutional: Denies fevers, chills or abnormal weight loss Eyes: Denies blurriness of vision Ears, nose, mouth, throat, and face: Denies mucositis or sore throat Respiratory: Denies cough, dyspnea or wheezes Cardiovascular: Denies palpitation, chest discomfort Gastrointestinal:  Denies nausea, heartburn or change in bowel habits Skin: Denies abnormal skin rashes Lymphatics: Denies new lymphadenopathy or easy bruising Neurological:Denies numbness, tingling or new weaknesses Behavioral/Psych: Mood is stable, no new changes  Extremities: No lower extremity edema Breast:  denies any pain or lumps or nodules in either breasts All other systems were reviewed with the patient and are negative.  I have reviewed the past medical history, past surgical history, social history and family history with the patient and they are unchanged from previous note.  ALLERGIES:  is allergic to levaquin [levofloxacin].  MEDICATIONS:  Current Outpatient Prescriptions  Medication Sig Dispense Refill  . acetaminophen (TYLENOL) 325 MG tablet Take 650 mg by mouth every 6 (six) hours as needed (pain).     Marland Kitchen amLODipine (NORVASC) 5 MG tablet Take 5 mg by mouth daily.    Marland Kitchen aspirin 81 MG tablet Take 81 mg  by mouth daily.     . bisacodyl (DULCOLAX) 5 MG EC tablet Take 5 mg by mouth once. Took 4 times for bowel prep    . Cranberry (ELLURA) 200 MG CAPS Take 200 mg by mouth daily.    . feeding supplement (BOOST / RESOURCE BREEZE) LIQD Take 1 Container by mouth 2 (two) times daily between meals.     . fluticasone (FLONASE) 50 MCG/ACT nasal spray Place 2 sprays into both nostrils daily.    Marland Kitchen  HYDROcodone-acetaminophen (NORCO/VICODIN) 5-325 MG tablet Take 1-2 tablets by mouth every 6 (six) hours as needed for moderate pain. 30 tablet 0  . hydrocortisone (ANUSOL-HC) 2.5 % rectal cream Apply 1 application topically 4 (four) times daily as needed for hemorrhoids. 30 g 0  . iron polysaccharides (NIFEREX) 150 MG capsule Take 150 mg by mouth daily.    Marland Kitchen levothyroxine (SYNTHROID, LEVOTHROID) 100 MCG tablet Take 100 mcg by mouth daily before breakfast.    . loperamide (IMODIUM A-D) 2 MG tablet Take 2 mg by mouth as needed for diarrhea or loose stools.    Marland Kitchen LORazepam (ATIVAN) 0.5 MG tablet Take 0.5 mg by mouth 2 (two) times daily as needed for anxiety.     . Magnesium 100 MG CAPS Take 100 mg by mouth daily.    Marland Kitchen omeprazole (PRILOSEC) 20 MG capsule Take 20 mg by mouth daily.    . ondansetron (ZOFRAN) 4 MG tablet Take 4 mg by mouth daily as needed for nausea or vomiting.    . polyethylene glycol (MIRALAX / GLYCOLAX) packet Take 17 g by mouth daily.    . Probiotic Product (PROBIOTIC DAILY PO) Take 1 tablet by mouth daily.     . ranitidine (ZANTAC) 150 MG capsule Take 150 mg by mouth daily as needed (indigestion).    . saccharomyces boulardii (FLORASTOR) 250 MG capsule Take 250 mg by mouth 2 (two) times daily.    . simethicone (MYLICON) 580 MG chewable tablet Chew 125 mg by mouth 3 (three) times daily before meals.    Marland Kitchen UNABLE TO FIND Med Name: neomycin    . Vitamin D, Ergocalciferol, (DRISDOL) 50000 units CAPS capsule Take 50,000 Units by mouth every 7 (seven) days.    Marland Kitchen buPROPion (WELLBUTRIN XL) 150 MG 24 hr tablet Take 150 mg by mouth daily.    . Lactobacillus-Inulin (Ecru) CAPS Take 1 capsule by mouth daily.    Marland Kitchen trimethoprim (TRIMPEX) 100 MG tablet Take 100 mg by mouth daily.     No current facility-administered medications for this visit.     PHYSICAL EXAMINATION: ECOG PERFORMANCE STATUS: 1 - Symptomatic but completely ambulatory  Vitals:   03/21/17 0939  BP:  (!) 148/64  Pulse: 75  Resp: 17  Temp: 98 F (36.7 C)  SpO2: 98%   Filed Weights   03/21/17 0939  Weight: 152 lb 9.6 oz (69.2 kg)    GENERAL:alert, no distress and comfortable SKIN: skin color, texture, turgor are normal, no rashes or significant lesions EYES: normal, Conjunctiva are pink and non-injected, sclera clear OROPHARYNX:no exudate, no erythema and lips, buccal mucosa, and tongue normal  NECK: supple, thyroid normal size, non-tender, without nodularity LYMPH:  no palpable lymphadenopathy in the cervical, axillary or inguinal LUNGS: clear to auscultation and percussion with normal breathing effort HEART: regular rate & rhythm and no murmurs and no lower extremity edema ABDOMEN:abdomen soft, non-tender and normal bowel sounds MUSCULOSKELETAL:no cyanosis of digits and no clubbing  NEURO: alert & oriented x 3 with  fluent speech, no focal motor/sensory deficits EXTREMITIES: No lower extremity edema  LABORATORY DATA:  I have reviewed the data as listed   Chemistry      Component Value Date/Time   NA 137 03/18/2017 0846   K 4.3 03/18/2017 0846   CL 103 02/14/2016 0414   CO2 27 03/18/2017 0846   BUN 18.4 03/18/2017 0846   CREATININE 1.3 (H) 03/18/2017 0846   GLU 178 10/27/2015      Component Value Date/Time   CALCIUM 11.0 (H) 03/18/2017 0846   ALKPHOS 134 03/18/2017 0846   AST 22 03/18/2017 0846   ALT 17 03/18/2017 0846   BILITOT 0.40 03/18/2017 0846       Lab Results  Component Value Date   WBC 6.5 03/18/2017   HGB 13.0 03/18/2017   HCT 40.5 03/18/2017   MCV 78.4 (L) 03/18/2017   PLT 170 03/18/2017   NEUTROABS 4.7 03/18/2017    ASSESSMENT & PLAN:  Liver metastasis ([place]) Liver partial resection segment 7 and 8 06/14/2015: Poorly differentiated carcinoma 8.6 cm, margins negative,positive for ck8/18, ck 7, partially positive for ck20, polyclonalCEA, cd10 and negative for HEPAR, AFP, CEA, WT-1, cdx-2, ER, PR, GCDFP and TTF-1) Cancer type ID: 96%  pancreaticobiliary origin, 60% cholangiocarcinoma, 36% gallbladder adenocarcinoma  Sigmoid colectomy 02/09/2016 for recurrent diverticulitis with chronic strictures, 4lymph nodes negative  03/18/17: CT CAP: Hypodense liver lesion smaller (was 3.1 cm, now 2.8 cm), No other new metastatic lesions noted.  Radiology review: No evidence of progression.  Repeat Ct in 6 months and follow up after with labs Patient does not like the oral contrast. So we will do a CT abdomen with IV contrast only. We will not give the oral contrast.  I spent 25 minutes talking to the patient of which more than half was spent in counseling and coordination of care.  Orders Placed This Encounter  Procedures  . CT Abdomen W Contrast    No oral contrast    Standing Status:   Future    Standing Expiration Date:   03/21/2018    Order Specific Question:   If indicated for the ordered procedure, I authorize the administration of contrast media per Radiology protocol    Answer:   Yes    Order Specific Question:   Preferred imaging location?    Answer:   [facility]    Order Specific Question:   Radiology Contrast Protocol - do NOT remove file path    Answer:   [email or web address]    Order Specific Question:   Reason for Exam additional comments    Answer:   Liver metastasis reevaluation   The patient has a good understanding of the overall plan. she agrees with it. she will call with any problems that may develop before the next visit here.   Rulon Eisenmenger, MD 03/21/17

## 2017-04-29 DIAGNOSIS — C229 Malignant neoplasm of liver, not specified as primary or secondary: Secondary | ICD-10-CM | POA: Diagnosis not present

## 2017-04-30 DIAGNOSIS — N3020 Other chronic cystitis without hematuria: Secondary | ICD-10-CM | POA: Diagnosis not present

## 2017-05-15 DIAGNOSIS — C229 Malignant neoplasm of liver, not specified as primary or secondary: Secondary | ICD-10-CM | POA: Diagnosis not present

## 2017-05-15 DIAGNOSIS — N189 Chronic kidney disease, unspecified: Secondary | ICD-10-CM | POA: Diagnosis not present

## 2017-05-15 DIAGNOSIS — E039 Hypothyroidism, unspecified: Secondary | ICD-10-CM | POA: Diagnosis not present

## 2017-05-15 DIAGNOSIS — E785 Hyperlipidemia, unspecified: Secondary | ICD-10-CM | POA: Diagnosis not present

## 2017-05-15 DIAGNOSIS — E119 Type 2 diabetes mellitus without complications: Secondary | ICD-10-CM | POA: Diagnosis not present

## 2017-05-21 DIAGNOSIS — Z23 Encounter for immunization: Secondary | ICD-10-CM | POA: Diagnosis not present

## 2017-06-01 DIAGNOSIS — R05 Cough: Secondary | ICD-10-CM | POA: Diagnosis not present

## 2017-06-27 DIAGNOSIS — M25511 Pain in right shoulder: Secondary | ICD-10-CM | POA: Diagnosis not present

## 2017-07-29 DIAGNOSIS — E039 Hypothyroidism, unspecified: Secondary | ICD-10-CM | POA: Diagnosis not present

## 2017-08-14 ENCOUNTER — Telehealth: Payer: Self-pay

## 2017-08-14 NOTE — Telephone Encounter (Signed)
Pt daughter called to confirm CT scheduled on 02/04 and appt with Dr. Lindi Adie 02/05. Concerned that scan is with oral contrast. Informed her the order put in is with iv contrast and states no oral contrast. No other questions at this time.  [name] RN

## 2017-09-16 ENCOUNTER — Inpatient Hospital Stay: Payer: Medicare Other | Attending: Hematology and Oncology

## 2017-09-16 ENCOUNTER — Encounter (HOSPITAL_COMMUNITY): Payer: Self-pay

## 2017-09-16 ENCOUNTER — Ambulatory Visit (HOSPITAL_COMMUNITY)
Admission: RE | Admit: 2017-09-16 | Discharge: 2017-09-16 | Disposition: A | Discharge status: Home / Self Care | Payer: Medicare Other | Source: Ambulatory Visit | Attending: Hematology and Oncology | Admitting: Hematology and Oncology

## 2017-09-16 DIAGNOSIS — C787 Secondary malignant neoplasm of liver and intrahepatic bile duct: Secondary | ICD-10-CM

## 2017-09-16 DIAGNOSIS — C801 Malignant (primary) neoplasm, unspecified: Secondary | ICD-10-CM | POA: Insufficient documentation

## 2017-09-16 DIAGNOSIS — E039 Hypothyroidism, unspecified: Secondary | ICD-10-CM | POA: Diagnosis not present

## 2017-09-16 DIAGNOSIS — I700 Atherosclerosis of aorta: Secondary | ICD-10-CM | POA: Insufficient documentation

## 2017-09-16 DIAGNOSIS — J90 Pleural effusion, not elsewhere classified: Secondary | ICD-10-CM | POA: Insufficient documentation

## 2017-09-16 LAB — CBC WITH DIFFERENTIAL/PLATELET
BASOS PCT: 1 %
Basophils Absolute: 0.1 10*3/uL (ref 0.0–0.1)
EOS ABS: 0.1 10*3/uL (ref 0.0–0.5)
EOS PCT: 2 %
HCT: 41.7 % (ref 34.8–46.6)
HEMOGLOBIN: 13.7 g/dL (ref 11.6–15.9)
Lymphocytes Relative: 19 %
Lymphs Abs: 1.3 10*3/uL (ref 0.9–3.3)
MCH: 28.5 pg (ref 25.1–34.0)
MCHC: 32.9 g/dL (ref 31.5–36.0)
MCV: 86.9 fL (ref 79.5–101.0)
MONO ABS: 0.7 10*3/uL (ref 0.1–0.9)
MONOS PCT: 11 %
NEUTROS PCT: 67 %
Neutro Abs: 4.4 10*3/uL (ref 1.5–6.5)
Platelets: 144 10*3/uL — ABNORMAL LOW (ref 145–400)
RBC: 4.8 MIL/uL (ref 3.70–5.45)
RDW: 14.7 % — AB (ref 11.2–14.5)
WBC: 6.6 10*3/uL (ref 3.9–10.3)

## 2017-09-16 LAB — COMPREHENSIVE METABOLIC PANEL
ALT: 11 U/L (ref 0–55)
ANION GAP: 7 (ref 3–11)
AST: 16 U/L (ref 5–34)
Albumin: 3.6 g/dL (ref 3.5–5.0)
Alkaline Phosphatase: 113 U/L (ref 40–150)
BUN: 22 mg/dL (ref 7–26)
CHLORIDE: 104 mmol/L (ref 98–109)
CO2: 28 mmol/L (ref 22–29)
Calcium: 10.6 mg/dL — ABNORMAL HIGH (ref 8.4–10.4)
Creatinine, Ser: 1.33 mg/dL — ABNORMAL HIGH (ref 0.60–1.10)
GFR calc Af Amer: 40 mL/min — ABNORMAL LOW (ref >60)
GFR, EST NON AFRICAN AMERICAN: 34 mL/min — AB (ref >60)
Glucose, Bld: 67 mg/dL — ABNORMAL LOW (ref 70–140)
POTASSIUM: 4.3 mmol/L (ref 3.5–5.1)
Sodium: 139 mmol/L (ref 136–145)
TOTAL PROTEIN: 7.5 g/dL (ref 6.4–8.3)
Total Bilirubin: 0.4 mg/dL (ref 0.2–1.2)

## 2017-09-16 MED ORDER — IOPAMIDOL (ISOVUE-300) INJECTION 61%
75.0000 mL | Freq: Once | INTRAVENOUS | Status: AC | PRN
  Administered 2017-09-16: 75 mL via INTRAVENOUS

## 2017-09-16 MED ORDER — IOPAMIDOL (ISOVUE-300) INJECTION 61%
INTRAVENOUS | Status: AC
  Filled 2017-09-16: qty 75

## 2017-09-17 ENCOUNTER — Inpatient Hospital Stay (HOSPITAL_BASED_OUTPATIENT_CLINIC_OR_DEPARTMENT_OTHER): Payer: Medicare Other | Admitting: Hematology and Oncology

## 2017-09-17 ENCOUNTER — Telehealth: Payer: Self-pay | Admitting: Hematology and Oncology

## 2017-09-17 DIAGNOSIS — C787 Secondary malignant neoplasm of liver and intrahepatic bile duct: Secondary | ICD-10-CM

## 2017-09-17 DIAGNOSIS — C801 Malignant (primary) neoplasm, unspecified: Secondary | ICD-10-CM | POA: Diagnosis not present

## 2017-09-17 NOTE — Telephone Encounter (Signed)
Gave patient AVs and calendar of upcoming February 2020 appointments.

## 2017-09-17 NOTE — Progress Notes (Signed)
Patient Care Team: Patient, [name] as PCP - General (General Practice) [name], MD as Consulting Physician (General Surgery)  DIAGNOSIS:  Encounter Diagnosis  Name Primary?  . Liver metastasis ([place])     SUMMARY OF ONCOLOGIC HISTORY:   Liver metastasis ([place])   05/03/2015 Imaging    CT abdomen: Bilobed 8.1 cm mass in the liver segment 5. 3.8 cm fluid collection posterior and medial: Favoring adnexal cyst      05/04/2015 Initial Diagnosis    Poorly differentiated carcinoma strongly positive for CK 8/18, CK 7 and MOC-31; HPAR faint cytoplasmic staining; (negative for ER, PR, AFP, TTF-1, WT 1, p63, CD10, CDX 2, CK 20, CK 5/6, mucicarmine)      05/17/2015 PET scan    Hypermetabolic partially necrotic right liver lobe mass , hypermetabolic some within the descending/sigmoid colon junction in the region of inflammation/ diverticulitis versus cancer      06/14/2015 Surgery    liver partial resection segment 7 and 8: Poorly differentiated carcinoma 8.6 cm, margins negative,positive for ck8/18, ck 7, partially positive for ck20, polyclonalCEA, cd10 and negative for HEPAR, AFP, Monoclonal CEA, WT-1, cdx-2, ER, PR, GCDFP and TTF      06/14/2015 Procedure    cancer type ID: 96% pancreaticobiliary origin, 60% cholangiocarcinoma, 36% gallbladder adenocarcinoma      02/09/2016 Surgery    Sigmoid colectomy for recurrent diverticulitis with chronic stricture; no malignancy 4 lymph nodes negative      11/28/2016 Relapse/Recurrence    New right hepatic lobe lesion worrisome for recurrent tumor 4 cm      01/01/2017 - 01/09/2017 Radiation Therapy    SBRT to liver      03/18/2017 Imaging    Hypodense liver lesion smaller (was 3.1 cm, now 2.8 cm), No other new metastatic lesions noted.        CHIEF COMPLIANT: Follow-up after recent CT scans  INTERVAL HISTORY: Destiny Velasquez is a 84 year old with poorly differentiated carcinoma involving the liver who underwent liver resection and we  could not find the primary source of her cancer.  She had a recurrence of this lesion in April 2018 and underwent SB RT to the liver.  Since then she has been monitored with serial CT scans.  She had a recent CT scan and is here today to discuss the results.  The CT scan shows further regression of the tumor size.  She has been doing quite well physically.  She denies any new pain or discomfort.  REVIEW OF SYSTEMS:   Constitutional: Denies fevers, chills or abnormal weight loss Eyes: Denies blurriness of vision Ears, nose, mouth, throat, and face: Denies mucositis or sore throat Respiratory: Denies cough, dyspnea or wheezes Cardiovascular: Denies palpitation, chest discomfort Gastrointestinal:  Denies nausea, heartburn or change in bowel habits Skin: Denies abnormal skin rashes Lymphatics: Denies new lymphadenopathy or easy bruising Neurological:Denies numbness, tingling or new weaknesses Behavioral/Psych: Mood is stable, no new changes  Extremities: No lower extremity edema  All other systems were reviewed with the patient and are negative.  I have reviewed the past medical history, past surgical history, social history and family history with the patient and they are unchanged from previous note.  ALLERGIES:  is allergic to levaquin [levofloxacin].  MEDICATIONS:  Current Outpatient Medications  Medication Sig Dispense Refill  . acetaminophen (TYLENOL) 325 MG tablet Take 650 mg by mouth every 6 (six) hours as needed (pain).     Marland Kitchen amLODipine (NORVASC) 5 MG tablet Take 5 mg by mouth daily.    Marland Kitchen  aspirin 81 MG tablet Take 81 mg by mouth daily.     . bisacodyl (DULCOLAX) 5 MG EC tablet Take 5 mg by mouth once. Took 4 times for bowel prep    . buPROPion (WELLBUTRIN XL) 150 MG 24 hr tablet Take 150 mg by mouth daily.    . Cranberry (ELLURA) 200 MG CAPS Take 200 mg by mouth daily.    . feeding supplement (BOOST / RESOURCE BREEZE) LIQD Take 1 Container by mouth 2 (two) times daily between  meals.     . fluticasone (FLONASE) 50 MCG/ACT nasal spray Place 2 sprays into both nostrils daily.    Marland Kitchen HYDROcodone-acetaminophen (NORCO/VICODIN) 5-325 MG tablet Take 1-2 tablets by mouth every 6 (six) hours as needed for moderate pain. 30 tablet 0  . hydrocortisone (ANUSOL-HC) 2.5 % rectal cream Apply 1 application topically 4 (four) times daily as needed for hemorrhoids. 30 g 0  . iron polysaccharides (NIFEREX) 150 MG capsule Take 150 mg by mouth daily.    . Lactobacillus-Inulin ([place]) CAPS Take 1 capsule by mouth daily.    Marland Kitchen levothyroxine (SYNTHROID, LEVOTHROID) 100 MCG tablet Take 100 mcg by mouth daily before breakfast.    . loperamide (IMODIUM A-D) 2 MG tablet Take 2 mg by mouth as needed for diarrhea or loose stools.    Marland Kitchen LORazepam (ATIVAN) 0.5 MG tablet Take 0.5 mg by mouth 2 (two) times daily as needed for anxiety.     . Magnesium 100 MG CAPS Take 100 mg by mouth daily.    Marland Kitchen omeprazole (PRILOSEC) 20 MG capsule Take 20 mg by mouth daily.    . ondansetron (ZOFRAN) 4 MG tablet Take 4 mg by mouth daily as needed for nausea or vomiting.    . polyethylene glycol (MIRALAX / GLYCOLAX) packet Take 17 g by mouth daily.    . Probiotic Product (PROBIOTIC DAILY PO) Take 1 tablet by mouth daily.     . ranitidine (ZANTAC) 150 MG capsule Take 150 mg by mouth daily as needed (indigestion).    . saccharomyces boulardii (FLORASTOR) 250 MG capsule Take 250 mg by mouth 2 (two) times daily.    . simethicone (MYLICON) 828 MG chewable tablet Chew 125 mg by mouth 3 (three) times daily before meals.    Marland Kitchen trimethoprim (TRIMPEX) 100 MG tablet Take 100 mg by mouth daily.    Marland Kitchen UNABLE TO FIND Med Name: neomycin    . Vitamin D, Ergocalciferol, (DRISDOL) 50000 units CAPS capsule Take 50,000 Units by mouth every 7 (seven) days.     No current facility-administered medications for this visit.     PHYSICAL EXAMINATION: ECOG PERFORMANCE STATUS: 1 - Symptomatic but completely ambulatory  Vitals:    09/17/17 1000  BP: (!) 141/67  Pulse: 65  Resp: 17  Temp: 98.4 F (36.9 C)  SpO2: 99%   Filed Weights   09/17/17 1000  Weight: 154 lb 6.4 oz (70 kg)    GENERAL:alert, no distress and comfortable SKIN: skin color, texture, turgor are normal, no rashes or significant lesions EYES: normal, Conjunctiva are pink and non-injected, sclera clear OROPHARYNX:no exudate, no erythema and lips, buccal mucosa, and tongue normal  NECK: supple, thyroid normal size, non-tender, without nodularity LYMPH:  no palpable lymphadenopathy in the cervical, axillary or inguinal LUNGS: clear to auscultation and percussion with normal breathing effort HEART: regular rate & rhythm and no murmurs and no lower extremity edema ABDOMEN:abdomen soft, non-tender and normal bowel sounds MUSCULOSKELETAL:no cyanosis of digits and no clubbing  NEURO: alert & oriented x 3 with fluent speech, no focal motor/sensory deficits EXTREMITIES: No lower extremity edema  LABORATORY DATA:  I have reviewed the data as listed CMP Latest Ref Rng & Units 09/16/2017 03/18/2017 12/10/2016  Glucose 70 - 140 mg/dL 67(L) 112 -  BUN 7 - 26 mg/dL 22 18.4 -  Creatinine 0.60 - 1.10 mg/dL 1.33(H) 1.3(H) 1.40(H)  Sodium 136 - 145 mmol/L 139 137 -  Potassium 3.5 - 5.1 mmol/L 4.3 4.3 -  Chloride 98 - 109 mmol/L 104 - -  CO2 22 - 29 mmol/L 28 27 -  Calcium 8.4 - 10.4 mg/dL 10.6(H) 11.0(H) -  Total Protein 6.4 - 8.3 g/dL 7.5 7.6 -  Total Bilirubin 0.2 - 1.2 mg/dL 0.4 0.40 -  Alkaline Phos 40 - 150 U/L 113 134 -  AST 5 - 34 U/L 16 22 -  ALT 0 - 55 U/L 11 17 -    Lab Results  Component Value Date   WBC 6.6 09/16/2017   HGB 13.7 09/16/2017   HCT 41.7 09/16/2017   MCV 86.9 09/16/2017   PLT 144 (L) 09/16/2017   NEUTROABS 4.4 09/16/2017    ASSESSMENT & PLAN:  Liver metastasis ([facility]) Liver partial resection segment 7 and 8 06/14/2015: Poorly differentiated carcinoma 8.6 cm, margins negative,positive for ck8/18, ck 7, partially positive for  ck20, polyclonalCEA, cd10 and negative for HEPAR, AFP, CEA, WT-1, cdx-2, ER, PR, GCDFP and TTF-1) Cancer type ID: 96% pancreaticobiliary origin, 60% cholangiocarcinoma, 36% gallbladder adenocarcinoma  Sigmoid colectomy 02/09/2016 for recurrent diverticulitis with chronic strictures, 4lymph nodes negative  03/18/17: CT CAP: Hypodense liver lesion smaller (was 3.1 cm, now 2.8 cm), No other new metastatic lesions noted. 09/16/2017: CT abdomen: Posterior right lobe liver mass decrease in size (1.7 cm was previously 2.8 cm), no new liver lesions  Radiology review: I discussed with the patient that overall there does not appear to be any progression of disease in fact there is regression of the liver lesion.  Return to clinic in 6 months with another CT abdomen and follow-up    I spent 25 minutes talking to the patient of which more than half was spent in counseling and coordination of care.  No orders of the defined types were placed in this encounter.  The patient has a good understanding of the overall plan. she agrees with it. she will call with any problems that may develop before the next visit here.   Harriette Ohara, MD 09/17/17

## 2017-09-17 NOTE — Assessment & Plan Note (Signed)
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 09/16/2017: CT abdomen: Posterior right lobe liver mass decrease in size (1.7 cm was previously 2.8 cm), no new liver lesions  Radiology review: I discussed with the patient that overall there does not appear to be any progression of disease in fact there is regression of the liver lesion.  Return to clinic in 6 months with another CT abdomen and follow-up

## 2017-09-19 ENCOUNTER — Ambulatory Visit: Payer: Medicare Other | Admitting: Hematology and Oncology

## 2017-09-26 DIAGNOSIS — H353131 Nonexudative age-related macular degeneration, bilateral, early dry stage: Secondary | ICD-10-CM | POA: Diagnosis not present

## 2017-09-26 DIAGNOSIS — H04123 Dry eye syndrome of bilateral lacrimal glands: Secondary | ICD-10-CM | POA: Diagnosis not present

## 2017-09-26 DIAGNOSIS — Z961 Presence of intraocular lens: Secondary | ICD-10-CM | POA: Diagnosis not present

## 2017-10-05 DIAGNOSIS — R1084 Generalized abdominal pain: Secondary | ICD-10-CM | POA: Diagnosis not present

## 2017-10-05 DIAGNOSIS — R109 Unspecified abdominal pain: Secondary | ICD-10-CM | POA: Diagnosis not present

## 2017-10-05 DIAGNOSIS — R339 Retention of urine, unspecified: Secondary | ICD-10-CM | POA: Diagnosis not present

## 2017-10-07 DIAGNOSIS — K5793 Diverticulitis of intestine, part unspecified, without perforation or abscess with bleeding: Secondary | ICD-10-CM | POA: Diagnosis not present

## 2017-11-15 DIAGNOSIS — C229 Malignant neoplasm of liver, not specified as primary or secondary: Secondary | ICD-10-CM | POA: Diagnosis not present

## 2017-11-15 DIAGNOSIS — I129 Hypertensive chronic kidney disease with stage 1 through stage 4 chronic kidney disease, or unspecified chronic kidney disease: Secondary | ICD-10-CM | POA: Diagnosis not present

## 2017-11-15 DIAGNOSIS — Z7982 Long term (current) use of aspirin: Secondary | ICD-10-CM | POA: Diagnosis not present

## 2017-11-15 DIAGNOSIS — R6889 Other general symptoms and signs: Secondary | ICD-10-CM | POA: Diagnosis not present

## 2017-11-15 DIAGNOSIS — N183 Chronic kidney disease, stage 3 (moderate): Secondary | ICD-10-CM | POA: Diagnosis not present

## 2017-11-15 DIAGNOSIS — J129 Viral pneumonia, unspecified: Secondary | ICD-10-CM | POA: Diagnosis not present

## 2017-11-15 DIAGNOSIS — E559 Vitamin D deficiency, unspecified: Secondary | ICD-10-CM | POA: Diagnosis not present

## 2017-11-15 DIAGNOSIS — N189 Chronic kidney disease, unspecified: Secondary | ICD-10-CM | POA: Diagnosis not present

## 2017-11-15 DIAGNOSIS — E119 Type 2 diabetes mellitus without complications: Secondary | ICD-10-CM | POA: Diagnosis not present

## 2017-11-15 DIAGNOSIS — E785 Hyperlipidemia, unspecified: Secondary | ICD-10-CM | POA: Diagnosis not present

## 2017-11-15 DIAGNOSIS — M810 Age-related osteoporosis without current pathological fracture: Secondary | ICD-10-CM | POA: Diagnosis not present

## 2017-11-15 DIAGNOSIS — E039 Hypothyroidism, unspecified: Secondary | ICD-10-CM | POA: Diagnosis not present

## 2017-11-21 DIAGNOSIS — C227 Other specified carcinomas of liver: Secondary | ICD-10-CM | POA: Diagnosis not present

## 2017-11-21 DIAGNOSIS — E039 Hypothyroidism, unspecified: Secondary | ICD-10-CM | POA: Diagnosis not present

## 2017-11-21 DIAGNOSIS — I10 Essential (primary) hypertension: Secondary | ICD-10-CM | POA: Diagnosis not present

## 2017-11-21 DIAGNOSIS — K5900 Constipation, unspecified: Secondary | ICD-10-CM | POA: Diagnosis not present

## 2017-11-21 DIAGNOSIS — K219 Gastro-esophageal reflux disease without esophagitis: Secondary | ICD-10-CM | POA: Diagnosis not present

## 2017-12-11 DIAGNOSIS — E039 Hypothyroidism, unspecified: Secondary | ICD-10-CM | POA: Diagnosis not present

## 2017-12-27 DIAGNOSIS — C229 Malignant neoplasm of liver, not specified as primary or secondary: Secondary | ICD-10-CM | POA: Diagnosis not present

## 2017-12-27 DIAGNOSIS — Z1231 Encounter for screening mammogram for malignant neoplasm of breast: Secondary | ICD-10-CM | POA: Diagnosis not present

## 2018-01-01 ENCOUNTER — Other Ambulatory Visit: Payer: Self-pay | Admitting: General Surgery

## 2018-01-01 DIAGNOSIS — Z1231 Encounter for screening mammogram for malignant neoplasm of breast: Secondary | ICD-10-CM

## 2018-03-18 DIAGNOSIS — I129 Hypertensive chronic kidney disease with stage 1 through stage 4 chronic kidney disease, or unspecified chronic kidney disease: Secondary | ICD-10-CM | POA: Diagnosis not present

## 2018-03-18 DIAGNOSIS — Z7982 Long term (current) use of aspirin: Secondary | ICD-10-CM | POA: Diagnosis not present

## 2018-03-18 DIAGNOSIS — K5720 Diverticulitis of large intestine with perforation and abscess without bleeding: Secondary | ICD-10-CM | POA: Diagnosis not present

## 2018-04-18 DIAGNOSIS — D1801 Hemangioma of skin and subcutaneous tissue: Secondary | ICD-10-CM | POA: Diagnosis not present

## 2018-04-18 DIAGNOSIS — L821 Other seborrheic keratosis: Secondary | ICD-10-CM | POA: Diagnosis not present

## 2018-04-18 DIAGNOSIS — L814 Other melanin hyperpigmentation: Secondary | ICD-10-CM | POA: Diagnosis not present

## 2018-05-14 DIAGNOSIS — K5720 Diverticulitis of large intestine with perforation and abscess without bleeding: Secondary | ICD-10-CM | POA: Diagnosis not present

## 2018-05-14 DIAGNOSIS — M810 Age-related osteoporosis without current pathological fracture: Secondary | ICD-10-CM | POA: Diagnosis not present

## 2018-05-14 DIAGNOSIS — E039 Hypothyroidism, unspecified: Secondary | ICD-10-CM | POA: Diagnosis not present

## 2018-05-14 DIAGNOSIS — D649 Anemia, unspecified: Secondary | ICD-10-CM | POA: Diagnosis not present

## 2018-05-14 DIAGNOSIS — E119 Type 2 diabetes mellitus without complications: Secondary | ICD-10-CM | POA: Diagnosis not present

## 2018-05-14 DIAGNOSIS — I129 Hypertensive chronic kidney disease with stage 1 through stage 4 chronic kidney disease, or unspecified chronic kidney disease: Secondary | ICD-10-CM | POA: Diagnosis not present

## 2018-05-14 DIAGNOSIS — N189 Chronic kidney disease, unspecified: Secondary | ICD-10-CM | POA: Diagnosis not present

## 2018-05-14 DIAGNOSIS — R6889 Other general symptoms and signs: Secondary | ICD-10-CM | POA: Diagnosis not present

## 2018-05-14 DIAGNOSIS — C229 Malignant neoplasm of liver, not specified as primary or secondary: Secondary | ICD-10-CM | POA: Diagnosis not present

## 2018-05-14 DIAGNOSIS — E559 Vitamin D deficiency, unspecified: Secondary | ICD-10-CM | POA: Diagnosis not present

## 2018-05-23 DIAGNOSIS — D1801 Hemangioma of skin and subcutaneous tissue: Secondary | ICD-10-CM | POA: Diagnosis not present

## 2018-05-23 DIAGNOSIS — L814 Other melanin hyperpigmentation: Secondary | ICD-10-CM | POA: Diagnosis not present

## 2018-05-23 DIAGNOSIS — L821 Other seborrheic keratosis: Secondary | ICD-10-CM | POA: Diagnosis not present

## 2018-05-23 DIAGNOSIS — L820 Inflamed seborrheic keratosis: Secondary | ICD-10-CM | POA: Diagnosis not present

## 2018-06-12 DIAGNOSIS — K219 Gastro-esophageal reflux disease without esophagitis: Secondary | ICD-10-CM | POA: Diagnosis not present

## 2018-06-12 DIAGNOSIS — N189 Chronic kidney disease, unspecified: Secondary | ICD-10-CM | POA: Diagnosis not present

## 2018-06-12 DIAGNOSIS — E039 Hypothyroidism, unspecified: Secondary | ICD-10-CM | POA: Diagnosis not present

## 2018-06-12 DIAGNOSIS — I129 Hypertensive chronic kidney disease with stage 1 through stage 4 chronic kidney disease, or unspecified chronic kidney disease: Secondary | ICD-10-CM | POA: Diagnosis not present

## 2018-06-12 DIAGNOSIS — K5900 Constipation, unspecified: Secondary | ICD-10-CM | POA: Diagnosis not present

## 2018-07-01 ENCOUNTER — Other Ambulatory Visit: Payer: Self-pay

## 2018-09-11 ENCOUNTER — Telehealth: Payer: Self-pay | Admitting: Hematology and Oncology

## 2018-09-11 NOTE — Telephone Encounter (Signed)
VG PAL 2/4 - moved f/u to 2/5. Spoke with patient dtr.

## 2018-09-15 ENCOUNTER — Ambulatory Visit (HOSPITAL_COMMUNITY)
Admission: RE | Admit: 2018-09-15 | Discharge: 2018-09-15 | Disposition: A | Discharge status: Home / Self Care | Payer: Medicare Other | Source: Ambulatory Visit | Attending: Hematology and Oncology | Admitting: Hematology and Oncology

## 2018-09-15 ENCOUNTER — Inpatient Hospital Stay: Payer: Medicare Other | Attending: Hematology and Oncology

## 2018-09-15 DIAGNOSIS — C787 Secondary malignant neoplasm of liver and intrahepatic bile duct: Secondary | ICD-10-CM | POA: Diagnosis not present

## 2018-09-15 DIAGNOSIS — N281 Cyst of kidney, acquired: Secondary | ICD-10-CM | POA: Diagnosis not present

## 2018-09-15 DIAGNOSIS — Z8505 Personal history of malignant neoplasm of liver: Secondary | ICD-10-CM | POA: Diagnosis not present

## 2018-09-15 LAB — COMPREHENSIVE METABOLIC PANEL
ALT: 13 U/L (ref 0–44)
AST: 13 U/L — ABNORMAL LOW (ref 15–41)
Albumin: 3.6 g/dL (ref 3.5–5.0)
Alkaline Phosphatase: 78 U/L (ref 38–126)
Anion gap: 8 (ref 5–15)
BUN: 26 mg/dL — ABNORMAL HIGH (ref 8–23)
CHLORIDE: 105 mmol/L (ref 98–111)
CO2: 26 mmol/L (ref 22–32)
Calcium: 9.9 mg/dL (ref 8.9–10.3)
Creatinine, Ser: 1.52 mg/dL — ABNORMAL HIGH (ref 0.44–1.00)
GFR calc non Af Amer: 30 mL/min — ABNORMAL LOW (ref >60)
GFR, EST AFRICAN AMERICAN: 35 mL/min — AB (ref >60)
Glucose, Bld: 147 mg/dL — ABNORMAL HIGH (ref 70–99)
Potassium: 4.2 mmol/L (ref 3.5–5.1)
SODIUM: 139 mmol/L (ref 135–145)
Total Bilirubin: 0.4 mg/dL (ref 0.3–1.2)
Total Protein: 7.1 g/dL (ref 6.5–8.1)

## 2018-09-15 LAB — CBC WITH DIFFERENTIAL/PLATELET
Abs Immature Granulocytes: 0.02 10*3/uL (ref 0.00–0.07)
BASOS ABS: 0.1 10*3/uL (ref 0.0–0.1)
Basophils Relative: 1 %
EOS ABS: 0.1 10*3/uL (ref 0.0–0.5)
Eosinophils Relative: 2 %
HEMATOCRIT: 41.8 % (ref 36.0–46.0)
Hemoglobin: 13.7 g/dL (ref 12.0–15.0)
IMMATURE GRANULOCYTES: 0 %
LYMPHS ABS: 1.2 10*3/uL (ref 0.7–4.0)
Lymphocytes Relative: 18 %
MCH: 29.4 pg (ref 26.0–34.0)
MCHC: 32.8 g/dL (ref 30.0–36.0)
MCV: 89.7 fL (ref 80.0–100.0)
Monocytes Absolute: 0.5 10*3/uL (ref 0.1–1.0)
Monocytes Relative: 8 %
NEUTROS PCT: 71 %
NRBC: 0 % (ref 0.0–0.2)
Neutro Abs: 4.8 10*3/uL (ref 1.7–7.7)
Platelets: 126 10*3/uL — ABNORMAL LOW (ref 150–400)
RBC: 4.66 MIL/uL (ref 3.87–5.11)
RDW: 13.2 % (ref 11.5–15.5)
WBC: 6.7 10*3/uL (ref 4.0–10.5)

## 2018-09-15 MED ORDER — SODIUM CHLORIDE (PF) 0.9 % IJ SOLN
INTRAMUSCULAR | Status: AC
  Filled 2018-09-15: qty 50

## 2018-09-15 MED ORDER — IOHEXOL 300 MG/ML  SOLN
75.0000 mL | Freq: Once | INTRAMUSCULAR | Status: AC | PRN
  Administered 2018-09-15: 75 mL via INTRAVENOUS

## 2018-09-16 ENCOUNTER — Ambulatory Visit: Payer: Medicare Other | Admitting: Hematology and Oncology

## 2018-09-16 NOTE — Progress Notes (Signed)
Patient Care Team: Patient, [name] as PCP - General (General Practice) [name], MD as Consulting Physician (General Surgery)  DIAGNOSIS:    ICD-10-CM   1. Liver metastasis ([place]) C78.7   2. Liver disease K76.9 CT Abdomen Pelvis W Contrast    SUMMARY OF ONCOLOGIC HISTORY:   Liver metastasis ([place])   05/03/2015 Imaging    CT abdomen: Bilobed 8.1 cm mass in the liver segment 5. 3.8 cm fluid collection posterior and medial: Favoring adnexal cyst    05/04/2015 Initial Diagnosis    Poorly differentiated carcinoma strongly positive for CK 8/18, CK 7 and MOC-31; HPAR faint cytoplasmic staining; (negative for ER, PR, AFP, TTF-1, WT 1, p63, CD10, CDX 2, CK 20, CK 5/6, mucicarmine)    05/17/2015 PET scan    Hypermetabolic partially necrotic right liver lobe mass , hypermetabolic some within the descending/sigmoid colon junction in the region of inflammation/ diverticulitis versus cancer    06/14/2015 Surgery    liver partial resection segment 7 and 8: Poorly differentiated carcinoma 8.6 cm, margins negative,positive for ck8/18, ck 7, partially positive for ck20, polyclonalCEA, cd10 and negative for HEPAR, AFP, Monoclonal CEA, WT-1, cdx-2, ER, PR, GCDFP and TTF    06/14/2015 Procedure    cancer type ID: 96% pancreaticobiliary origin, 60% cholangiocarcinoma, 36% gallbladder adenocarcinoma    02/09/2016 Surgery    Sigmoid colectomy for recurrent diverticulitis with chronic stricture; no malignancy 4 lymph nodes negative    11/28/2016 Relapse/Recurrence    New right hepatic lobe lesion worrisome for recurrent tumor 4 cm    01/01/2017 - 01/09/2017 Radiation Therapy    SBRT to liver    03/18/2017 Imaging    Hypodense liver lesion smaller (was 3.1 cm, now 2.8 cm), No other new metastatic lesions noted.     09/15/2018 Imaging    CT abdomen: Continued decrease in the size of the posterior right lobe liver mass measuring 1.5 x 1.2 x 0.9 cm, previously it was 1.7 x 1.5 x 1.7 cm      CHIEF  COMPLIANT: Follow-up of liver metastasis and recent CT scan  INTERVAL HISTORY: Destiny Velasquez is a 85 y.o. with above-mentioned history of poorly differentiated carcinoma involving the liver who underwent liver resection and we could not find the primary source of her cancer. She had a recurrence of this lesion in April 2018 and underwent SB RT to the liver. I last saw the patient one year ago. A CT CAP from 09/15/18 showed continued regression of the liver mass. She presents to the clinic today with her daughter and is doing well. She denies any abdominal pain or nausea. She notes a hard nodule that occasionally appears in the middle of her abdomen in the morning and after she eats that is not painful or bothersome. She has a history of hernia in that area. She uses a walker to ambulate. She reviewed her medication list with me.    REVIEW OF SYSTEMS:   Constitutional: Denies fevers, chills or abnormal weight loss Eyes: Denies blurriness of vision Ears, nose, mouth, throat, and face: Denies mucositis or sore throat Respiratory: Denies cough, dyspnea or wheezes Cardiovascular: Denies palpitation, chest discomfort Gastrointestinal: Denies nausea, heartburn or change in bowel habits (+) abdominal hernia Skin: Denies abnormal skin rashes Lymphatics: Denies new lymphadenopathy or easy bruising Neurological: Denies numbness, tingling or new weaknesses Behavioral/Psych: Mood is stable, no new changes  Extremities: No lower extremity edema Breast: denies any pain or lumps or nodules in either breasts All other systems  were reviewed with the patient and are negative.  I have reviewed the past medical history, past surgical history, social history and family history with the patient and they are unchanged from previous note.  ALLERGIES:  is allergic to levaquin [levofloxacin].  MEDICATIONS:  Current Outpatient Medications  Medication Sig Dispense Refill  . acetaminophen (TYLENOL) 325 MG tablet Take 650 mg  by mouth every 6 (six) hours as needed (pain).     Marland Kitchen amLODipine (NORVASC) 5 MG tablet Take 5 mg by mouth daily.    Marland Kitchen aspirin 81 MG tablet Take 81 mg by mouth daily.     . bisacodyl (DULCOLAX) 5 MG EC tablet Take 5 mg by mouth once. Took 4 times for bowel prep    . buPROPion (WELLBUTRIN XL) 150 MG 24 hr tablet Take 150 mg by mouth daily.    . Cranberry (ELLURA) 200 MG CAPS Take 200 mg by mouth daily.    . feeding supplement (BOOST / RESOURCE BREEZE) LIQD Take 1 Container by mouth 2 (two) times daily between meals.     . fluticasone (FLONASE) 50 MCG/ACT nasal spray Place 2 sprays into both nostrils daily.    . iron polysaccharides (NIFEREX) 150 MG capsule Take 150 mg by mouth daily.    . Lactobacillus-Inulin ([place]) CAPS Take 1 capsule by mouth daily.    Marland Kitchen levothyroxine (SYNTHROID, LEVOTHROID) 100 MCG tablet Take 100 mcg by mouth daily before breakfast.    . loperamide (IMODIUM A-D) 2 MG tablet Take 2 mg by mouth as needed for diarrhea or loose stools.    . Magnesium 100 MG CAPS Take 100 mg by mouth daily.    Marland Kitchen omeprazole (PRILOSEC) 20 MG capsule Take 20 mg by mouth daily.    . ondansetron (ZOFRAN) 4 MG tablet Take 4 mg by mouth daily as needed for nausea or vomiting.    . polyethylene glycol (MIRALAX / GLYCOLAX) packet Take 17 g by mouth daily.    . Probiotic Product (PROBIOTIC DAILY PO) Take 1 tablet by mouth daily.     . ranitidine (ZANTAC) 150 MG capsule Take 150 mg by mouth daily as needed (indigestion).    . saccharomyces boulardii (FLORASTOR) 250 MG capsule Take 250 mg by mouth 2 (two) times daily.    . simethicone (MYLICON) 295 MG chewable tablet Chew 125 mg by mouth 3 (three) times daily before meals.    Marland Kitchen trimethoprim (TRIMPEX) 100 MG tablet Take 100 mg by mouth daily.    Marland Kitchen UNABLE TO FIND Med Name: neomycin    . Vitamin D, Ergocalciferol, (DRISDOL) 50000 units CAPS capsule Take 50,000 Units by mouth every 7 (seven) days.     No current facility-administered  medications for this visit.     PHYSICAL EXAMINATION: ECOG PERFORMANCE STATUS: 1 - Symptomatic but completely ambulatory  Vitals:   09/17/18 0920  BP: (!) 145/57  Pulse: 66  Resp: 17  Temp: 97.9 F (36.6 C)  SpO2: 99%   Filed Weights   09/17/18 0920  Weight: 72 kg    GENERAL: alert, no distress and comfortable SKIN: skin color, texture, turgor are normal, no rashes or significant lesions EYES: normal, Conjunctiva are pink and non-injected, sclera clear OROPHARYNX: no exudate, no erythema and lips, buccal mucosa, and tongue normal  NECK: supple, thyroid normal size, non-tender, without nodularity LYMPH: no palpable lymphadenopathy in the cervical, axillary or inguinal LUNGS: clear to auscultation and percussion with normal breathing effort HEART: regular rate & rhythm and no murmurs and  no lower extremity edema ABDOMEN: abdomen soft, non-tender and normal bowel sounds, central ventral hernia MUSCULOSKELETAL: no cyanosis of digits and no clubbing  NEURO: alert & oriented x 3 with fluent speech, uses a walker to get around. EXTREMITIES: No lower extremity edema  LABORATORY DATA:  I have reviewed the data as listed CMP Latest Ref Rng & Units 09/15/2018 09/16/2017 03/18/2017  Glucose 70 - 99 mg/dL 147(H) 67(L) 112  BUN 8 - 23 mg/dL 26(H) 22 18.4  Creatinine 0.44 - 1.00 mg/dL 1.52(H) 1.33(H) 1.3(H)  Sodium 135 - 145 mmol/L 139 139 137  Potassium 3.5 - 5.1 mmol/L 4.2 4.3 4.3  Chloride 98 - 111 mmol/L 105 104 -  CO2 22 - 32 mmol/L 26 28 27   Calcium 8.9 - 10.3 mg/dL 9.9 10.6(H) 11.0(H)  Total Protein 6.5 - 8.1 g/dL 7.1 7.5 7.6  Total Bilirubin 0.3 - 1.2 mg/dL 0.4 0.4 0.40  Alkaline Phos 38 - 126 U/L 78 113 134  AST 15 - 41 U/L 13(L) 16 22  ALT 0 - 44 U/L 13 11 17     Lab Results  Component Value Date   WBC 6.7 09/15/2018   HGB 13.7 09/15/2018   HCT 41.8 09/15/2018   MCV 89.7 09/15/2018   PLT 126 (L) 09/15/2018   NEUTROABS 4.8 09/15/2018    ASSESSMENT & PLAN:  Liver  metastasis ([facility]) Liver partial resection segment 7 and 8 06/14/2015: Poorly differentiated carcinoma 8.6 cm, margins negative,positive for ck8/18, ck 7, partially positive for ck20, polyclonalCEA, cd10 and negative for HEPAR, AFP, CEA, WT-1, cdx-2, ER, PR, GCDFP and TTF-1) Cancer type ID: 96% pancreaticobiliary origin, 60% cholangiocarcinoma, 36% gallbladder adenocarcinoma  Sigmoid colectomy 02/09/2016 for recurrent diverticulitis with chronic strictures, 4lymph nodes negative -------------------------------------------------------------------------------------------------------------------------------------------------------------  CT abdomen 09/15/2018:CT abdomen: Continued decrease in the size of the posterior right lobe liver mass measuring 1.5 x 1.2 x 0.9 cm, previously it was 1.7 x 1.5 x 1.7 cm  Radiology review: Based upon the CT findings there is no indication to pursue any further treatments. Plan to see the patient once a year with CT abdomen and follow-up.    Orders Placed This Encounter  Procedures  . CT Abdomen Pelvis W Contrast    Standing Status:   Future    Standing Expiration Date:   09/17/2019    Order Specific Question:   ** REASON FOR EXAM (FREE TEXT)    Answer:   Liver mets evaluation    Order Specific Question:   If indicated for the ordered procedure, I authorize the administration of contrast media per Radiology protocol    Answer:   Yes    Order Specific Question:   Preferred imaging location?    Answer:   [facility]    Order Specific Question:   Is Oral Contrast requested for this exam?    Answer:   No oral contrast    Order Specific Question:   Reason for No Oral Contrast    Answer:   Medical necessity (Time Sensitive)    Order Specific Question:   Radiology Contrast Protocol - do NOT remove file path    Answer:   [email or web address]   The patient has a good understanding of the overall plan. she agrees with it. she will  call with any problems that may develop before the next visit here.  Nicholas Lose, MD 09/17/2018  Julious Oka Dorshimer am acting as scribe for Dr. Nicholas Lose.  I have reviewed the above documentation for accuracy and completeness, and I  agree with the above.

## 2018-09-17 ENCOUNTER — Inpatient Hospital Stay (HOSPITAL_BASED_OUTPATIENT_CLINIC_OR_DEPARTMENT_OTHER): Payer: Medicare Other | Admitting: Hematology and Oncology

## 2018-09-17 ENCOUNTER — Telehealth: Payer: Self-pay | Admitting: Hematology and Oncology

## 2018-09-17 DIAGNOSIS — Z8505 Personal history of malignant neoplasm of liver: Secondary | ICD-10-CM

## 2018-09-17 DIAGNOSIS — K769 Liver disease, unspecified: Secondary | ICD-10-CM

## 2018-09-17 DIAGNOSIS — C787 Secondary malignant neoplasm of liver and intrahepatic bile duct: Secondary | ICD-10-CM

## 2018-09-17 NOTE — Telephone Encounter (Signed)
Gave avs and calendar patient decline contrast

## 2018-09-17 NOTE — Assessment & Plan Note (Signed)
Liver partial resection segment 7 and 8 06/14/2015: Poorly differentiated carcinoma 8.6 cm, margins negative,positive for ck8/18, ck 7, partially positive for ck20, polyclonalCEA, cd10 and negative for HEPAR, AFP, CEA, WT-1, cdx-2, ER, PR, GCDFP and TTF-1) Cancer type ID: 96% pancreaticobiliary origin, 60% cholangiocarcinoma, 36% gallbladder adenocarcinoma  Sigmoid colectomy 02/09/2016 for recurrent diverticulitis with chronic strictures, 4lymph nodes negative -------------------------------------------------------------------------------------------------------------------------------------------------------------  CT abdomen 09/15/2018:CT abdomen: Continued decrease in the size of the posterior right lobe liver mass measuring 1.5 x 1.2 x 0.9 cm, previously it was 1.7 x 1.5 x 1.7 cm  Radiology review: Based upon the CT findings there is no indication to pursue any further treatments. Plan to see the patient once a year with CT abdomen and follow-up.

## 2018-11-19 DIAGNOSIS — I129 Hypertensive chronic kidney disease with stage 1 through stage 4 chronic kidney disease, or unspecified chronic kidney disease: Secondary | ICD-10-CM | POA: Diagnosis not present

## 2018-11-19 DIAGNOSIS — N189 Chronic kidney disease, unspecified: Secondary | ICD-10-CM | POA: Diagnosis not present

## 2018-11-19 DIAGNOSIS — R6889 Other general symptoms and signs: Secondary | ICD-10-CM | POA: Diagnosis not present

## 2018-11-19 DIAGNOSIS — E039 Hypothyroidism, unspecified: Secondary | ICD-10-CM | POA: Diagnosis not present

## 2018-11-19 DIAGNOSIS — E559 Vitamin D deficiency, unspecified: Secondary | ICD-10-CM | POA: Diagnosis not present

## 2018-11-20 DIAGNOSIS — E039 Hypothyroidism, unspecified: Secondary | ICD-10-CM | POA: Diagnosis not present

## 2018-11-20 DIAGNOSIS — C229 Malignant neoplasm of liver, not specified as primary or secondary: Secondary | ICD-10-CM | POA: Diagnosis not present

## 2018-11-20 DIAGNOSIS — N183 Chronic kidney disease, stage 3 (moderate): Secondary | ICD-10-CM | POA: Diagnosis not present

## 2018-11-20 DIAGNOSIS — I129 Hypertensive chronic kidney disease with stage 1 through stage 4 chronic kidney disease, or unspecified chronic kidney disease: Secondary | ICD-10-CM | POA: Diagnosis not present

## 2018-11-20 DIAGNOSIS — K219 Gastro-esophageal reflux disease without esophagitis: Secondary | ICD-10-CM | POA: Diagnosis not present

## 2018-11-20 DIAGNOSIS — D649 Anemia, unspecified: Secondary | ICD-10-CM | POA: Diagnosis not present

## 2019-01-27 DIAGNOSIS — K0889 Other specified disorders of teeth and supporting structures: Secondary | ICD-10-CM | POA: Diagnosis not present

## 2019-04-03 DIAGNOSIS — Z1231 Encounter for screening mammogram for malignant neoplasm of breast: Secondary | ICD-10-CM | POA: Diagnosis not present

## 2019-04-03 DIAGNOSIS — C229 Malignant neoplasm of liver, not specified as primary or secondary: Secondary | ICD-10-CM | POA: Diagnosis not present

## 2019-04-03 DIAGNOSIS — K432 Incisional hernia without obstruction or gangrene: Secondary | ICD-10-CM | POA: Diagnosis not present

## 2019-04-03 DIAGNOSIS — Z853 Personal history of malignant neoplasm of breast: Secondary | ICD-10-CM | POA: Diagnosis not present

## 2019-05-21 DIAGNOSIS — E119 Type 2 diabetes mellitus without complications: Secondary | ICD-10-CM | POA: Diagnosis not present

## 2019-05-21 DIAGNOSIS — E559 Vitamin D deficiency, unspecified: Secondary | ICD-10-CM | POA: Diagnosis not present

## 2019-05-21 DIAGNOSIS — I129 Hypertensive chronic kidney disease with stage 1 through stage 4 chronic kidney disease, or unspecified chronic kidney disease: Secondary | ICD-10-CM | POA: Diagnosis not present

## 2019-05-21 DIAGNOSIS — N189 Chronic kidney disease, unspecified: Secondary | ICD-10-CM | POA: Diagnosis not present

## 2019-05-21 DIAGNOSIS — Z853 Personal history of malignant neoplasm of breast: Secondary | ICD-10-CM | POA: Diagnosis not present

## 2019-05-21 DIAGNOSIS — E039 Hypothyroidism, unspecified: Secondary | ICD-10-CM | POA: Diagnosis not present

## 2019-05-21 DIAGNOSIS — E785 Hyperlipidemia, unspecified: Secondary | ICD-10-CM | POA: Diagnosis not present

## 2019-05-21 DIAGNOSIS — Z7982 Long term (current) use of aspirin: Secondary | ICD-10-CM | POA: Diagnosis not present

## 2019-05-21 DIAGNOSIS — C229 Malignant neoplasm of liver, not specified as primary or secondary: Secondary | ICD-10-CM | POA: Diagnosis not present

## 2019-05-21 DIAGNOSIS — K5720 Diverticulitis of large intestine with perforation and abscess without bleeding: Secondary | ICD-10-CM | POA: Diagnosis not present

## 2019-06-03 DIAGNOSIS — E039 Hypothyroidism, unspecified: Secondary | ICD-10-CM | POA: Diagnosis not present

## 2019-06-03 DIAGNOSIS — I129 Hypertensive chronic kidney disease with stage 1 through stage 4 chronic kidney disease, or unspecified chronic kidney disease: Secondary | ICD-10-CM | POA: Diagnosis not present

## 2019-06-03 DIAGNOSIS — C229 Malignant neoplasm of liver, not specified as primary or secondary: Secondary | ICD-10-CM | POA: Diagnosis not present

## 2019-06-03 DIAGNOSIS — E785 Hyperlipidemia, unspecified: Secondary | ICD-10-CM | POA: Diagnosis not present

## 2019-06-03 DIAGNOSIS — K219 Gastro-esophageal reflux disease without esophagitis: Secondary | ICD-10-CM | POA: Diagnosis not present

## 2019-06-03 DIAGNOSIS — D649 Anemia, unspecified: Secondary | ICD-10-CM | POA: Diagnosis not present

## 2019-08-17 ENCOUNTER — Other Ambulatory Visit: Payer: Self-pay

## 2019-08-17 DIAGNOSIS — C787 Secondary malignant neoplasm of liver and intrahepatic bile duct: Secondary | ICD-10-CM

## 2019-08-25 DIAGNOSIS — Z23 Encounter for immunization: Secondary | ICD-10-CM | POA: Diagnosis not present

## 2019-09-15 ENCOUNTER — Ambulatory Visit (HOSPITAL_COMMUNITY)
Admission: RE | Admit: 2019-09-15 | Discharge: 2019-09-15 | Disposition: A | Discharge status: Home / Self Care | Payer: Medicare Other | Source: Ambulatory Visit | Attending: Hematology and Oncology | Admitting: Hematology and Oncology

## 2019-09-15 ENCOUNTER — Other Ambulatory Visit: Payer: Self-pay

## 2019-09-15 ENCOUNTER — Inpatient Hospital Stay: Payer: Medicare Other | Attending: Hematology and Oncology

## 2019-09-15 DIAGNOSIS — Z8505 Personal history of malignant neoplasm of liver: Secondary | ICD-10-CM | POA: Diagnosis not present

## 2019-09-15 DIAGNOSIS — K769 Liver disease, unspecified: Secondary | ICD-10-CM | POA: Insufficient documentation

## 2019-09-15 DIAGNOSIS — C787 Secondary malignant neoplasm of liver and intrahepatic bile duct: Secondary | ICD-10-CM

## 2019-09-15 DIAGNOSIS — K7689 Other specified diseases of liver: Secondary | ICD-10-CM | POA: Diagnosis not present

## 2019-09-15 LAB — CBC WITH DIFFERENTIAL (CANCER CENTER ONLY)
Abs Immature Granulocytes: 0.02 10*3/uL (ref 0.00–0.07)
Basophils Absolute: 0.1 10*3/uL (ref 0.0–0.1)
Basophils Relative: 1 %
Eosinophils Absolute: 0.2 10*3/uL (ref 0.0–0.5)
Eosinophils Relative: 2 %
HCT: 43.6 % (ref 36.0–46.0)
Hemoglobin: 14 g/dL (ref 12.0–15.0)
Immature Granulocytes: 0 %
Lymphocytes Relative: 22 %
Lymphs Abs: 1.4 10*3/uL (ref 0.7–4.0)
MCH: 29.4 pg (ref 26.0–34.0)
MCHC: 32.1 g/dL (ref 30.0–36.0)
MCV: 91.6 fL (ref 80.0–100.0)
Monocytes Absolute: 0.7 10*3/uL (ref 0.1–1.0)
Monocytes Relative: 10 %
Neutro Abs: 4.2 10*3/uL (ref 1.7–7.7)
Neutrophils Relative %: 65 %
Platelet Count: 141 10*3/uL — ABNORMAL LOW (ref 150–400)
RBC: 4.76 MIL/uL (ref 3.87–5.11)
RDW: 13.1 % (ref 11.5–15.5)
WBC Count: 6.6 10*3/uL (ref 4.0–10.5)
nRBC: 0 % (ref 0.0–0.2)

## 2019-09-15 LAB — CMP (CANCER CENTER ONLY)
ALT: 13 U/L (ref 0–44)
AST: 13 U/L — ABNORMAL LOW (ref 15–41)
Albumin: 3.9 g/dL (ref 3.5–5.0)
Alkaline Phosphatase: 84 U/L (ref 38–126)
Anion gap: 7 (ref 5–15)
BUN: 26 mg/dL — ABNORMAL HIGH (ref 8–23)
CO2: 28 mmol/L (ref 22–32)
Calcium: 10.3 mg/dL (ref 8.9–10.3)
Chloride: 107 mmol/L (ref 98–111)
Creatinine: 1.48 mg/dL — ABNORMAL HIGH (ref 0.44–1.00)
GFR, Est AFR Am: 36 mL/min — ABNORMAL LOW (ref >60)
GFR, Estimated: 31 mL/min — ABNORMAL LOW (ref >60)
Glucose, Bld: 74 mg/dL (ref 70–99)
Potassium: 4.1 mmol/L (ref 3.5–5.1)
Sodium: 142 mmol/L (ref 135–145)
Total Bilirubin: 0.3 mg/dL (ref 0.3–1.2)
Total Protein: 7.7 g/dL (ref 6.5–8.1)

## 2019-09-15 MED ORDER — SODIUM CHLORIDE (PF) 0.9 % IJ SOLN
INTRAMUSCULAR | Status: AC
  Filled 2019-09-15: qty 50

## 2019-09-15 MED ORDER — IOHEXOL 300 MG/ML  SOLN
75.0000 mL | Freq: Once | INTRAMUSCULAR | Status: AC | PRN
  Administered 2019-09-15: 75 mL via INTRAVENOUS

## 2019-09-17 NOTE — Progress Notes (Signed)
Patient Care Team: Patient, [name] as PCP - General (General Practice) [name], MD as Consulting Physician (General Surgery)  DIAGNOSIS:    ICD-10-CM   1. Liver metastasis ([place])  C78.7     SUMMARY OF ONCOLOGIC HISTORY: Oncology History  Liver metastasis ([place])  05/03/2015 Imaging   CT abdomen: Bilobed 8.1 cm mass in the liver segment 5. 3.8 cm fluid collection posterior and medial: Favoring adnexal cyst   05/04/2015 Initial Diagnosis   Poorly differentiated carcinoma strongly positive for CK 8/18, CK 7 and MOC-31; HPAR faint cytoplasmic staining; (negative for ER, PR, AFP, TTF-1, WT 1, p63, CD10, CDX 2, CK 20, CK 5/6, mucicarmine)   05/17/2015 PET scan   Hypermetabolic partially necrotic right liver lobe mass , hypermetabolic some within the descending/sigmoid colon junction in the region of inflammation/ diverticulitis versus cancer   06/14/2015 Surgery   liver partial resection segment 7 and 8: Poorly differentiated carcinoma 8.6 cm, margins negative,positive for ck8/18, ck 7, partially positive for ck20, polyclonalCEA, cd10 and negative for HEPAR, AFP, Monoclonal CEA, WT-1, cdx-2, ER, PR, GCDFP and TTF   06/14/2015 Procedure   cancer type ID: 96% pancreaticobiliary origin, 60% cholangiocarcinoma, 36% gallbladder adenocarcinoma   02/09/2016 Surgery   Sigmoid colectomy for recurrent diverticulitis with chronic stricture; no malignancy 4 lymph nodes negative   11/28/2016 Relapse/Recurrence   New right hepatic lobe lesion worrisome for recurrent tumor 4 cm   01/01/2017 - 01/09/2017 Radiation Therapy   SBRT to liver   03/18/2017 Imaging   Hypodense liver lesion smaller (was 3.1 cm, now 2.8 cm), No other new metastatic lesions noted.    09/15/2018 Imaging   CT abdomen: Continued decrease in the size of the posterior right lobe liver mass measuring 1.5 x 1.2 x 0.9 cm, previously it was 1.7 x 1.5 x 1.7 cm      CHIEF COMPLIANT: Follow-up poorly differentiated carcinoma of  the liver  INTERVAL HISTORY: Destiny Velasquez is a 86 y.o. with above-mentioned history of poorly differentiated carcinoma involving the liver who underwent liver resection. Primary source of her cancer could not be identified. She had a recurrence of this lesion in April 2018 and underwent SB RT to the liver. A CT CAP from 09/15/19 showed no evidence of recurrent malignancy and stable appearance of the right hepatic lobe lesion. She presents to the clinic today for follow-up.  She lives in the nursing home and her movements have been markedly restricted because of COVID-19 pandemic.  She received her first vaccine and did very well.  She just celebrated her 73st wedding anniversary.  ALLERGIES:  is allergic to levaquin [levofloxacin].  MEDICATIONS:  Current Outpatient Medications  Medication Sig Dispense Refill  . acetaminophen (TYLENOL) 325 MG tablet Take 650 mg by mouth every 6 (six) hours as needed (pain).     Marland Kitchen amLODipine (NORVASC) 5 MG tablet Take 5 mg by mouth daily.    Marland Kitchen aspirin 81 MG tablet Take 81 mg by mouth daily.     . bisacodyl (DULCOLAX) 5 MG EC tablet Take 5 mg by mouth once. Took 4 times for bowel prep    . buPROPion (WELLBUTRIN XL) 150 MG 24 hr tablet Take 150 mg by mouth daily.    . Cranberry (ELLURA) 200 MG CAPS Take 200 mg by mouth daily.    . feeding supplement (BOOST / RESOURCE BREEZE) LIQD Take 1 Container by mouth 2 (two) times daily between meals.     . fluticasone (FLONASE) 50 MCG/ACT nasal spray  Place 2 sprays into both nostrils daily.    Marland Kitchen ibuprofen (ADVIL,MOTRIN) 400 MG tablet Take 400 mg by mouth every 6 (six) hours as needed.    . iron polysaccharides (NIFEREX) 150 MG capsule Take 150 mg by mouth daily.    . Lactobacillus-Inulin ([place]) CAPS Take 1 capsule by mouth daily.    Marland Kitchen levothyroxine (SYNTHROID, LEVOTHROID) 100 MCG tablet Take 100 mcg by mouth daily before breakfast.    . loperamide (IMODIUM A-D) 2 MG tablet Take 2 mg by mouth as needed for  diarrhea or loose stools.    . Magnesium 100 MG CAPS Take 100 mg by mouth daily.    Marland Kitchen omeprazole (PRILOSEC) 20 MG capsule Take 20 mg by mouth daily.    . ondansetron (ZOFRAN) 4 MG tablet Take 4 mg by mouth daily as needed for nausea or vomiting.    . polyethylene glycol (MIRALAX / GLYCOLAX) packet Take 17 g by mouth daily.    . Probiotic Product (PROBIOTIC DAILY PO) Take 1 tablet by mouth daily.     . ranitidine (ZANTAC) 150 MG capsule Take 150 mg by mouth daily as needed (indigestion).    . saccharomyces boulardii (FLORASTOR) 250 MG capsule Take 250 mg by mouth 2 (two) times daily.    . simethicone (MYLICON) 384 MG chewable tablet Chew 125 mg by mouth 3 (three) times daily before meals.    Marland Kitchen trimethoprim (TRIMPEX) 100 MG tablet Take 100 mg by mouth daily.    Marland Kitchen UNABLE TO FIND Med Name: neomycin    . Vitamin D, Ergocalciferol, (DRISDOL) 50000 units CAPS capsule Take 50,000 Units by mouth every 7 (seven) days.     No current facility-administered medications for this visit.    PHYSICAL EXAMINATION: ECOG PERFORMANCE STATUS: 1 - Symptomatic but completely ambulatory  Vitals:   09/18/19 0924  BP: 129/62  Pulse: 70  Resp: 17  Temp: 98.3 F (36.8 C)  SpO2: 99%   Filed Weights   09/18/19 0924  Weight: 153 lb 3.2 oz (69.5 kg)    LABORATORY DATA:  I have reviewed the data as listed CMP Latest Ref Rng & Units 09/15/2019 09/15/2018 09/16/2017  Glucose 70 - 99 mg/dL 74 147(H) 67(L)  BUN 8 - 23 mg/dL 26(H) 26(H) 22  Creatinine 0.44 - 1.00 mg/dL 1.48(H) 1.52(H) 1.33(H)  Sodium 135 - 145 mmol/L 142 139 139  Potassium 3.5 - 5.1 mmol/L 4.1 4.2 4.3  Chloride 98 - 111 mmol/L 107 105 104  CO2 22 - 32 mmol/L 28 26 28   Calcium 8.9 - 10.3 mg/dL 10.3 9.9 10.6(H)  Total Protein 6.5 - 8.1 g/dL 7.7 7.1 7.5  Total Bilirubin 0.3 - 1.2 mg/dL 0.3 0.4 0.4  Alkaline Phos 38 - 126 U/L 84 78 113  AST 15 - 41 U/L 13(L) 13(L) 16  ALT 0 - 44 U/L 13 13 11     Lab Results  Component Value Date   WBC 6.6  09/15/2019   HGB 14.0 09/15/2019   HCT 43.6 09/15/2019   MCV 91.6 09/15/2019   PLT 141 (L) 09/15/2019   NEUTROABS 4.2 09/15/2019    ASSESSMENT & PLAN:  Liver metastasis ([facility]) Liver partial resection segment 7 and 8 06/14/2015: Poorly differentiated carcinoma 8.6 cm, margins negative,positive for ck8/18, ck 7, partially positive for ck20, polyclonalCEA, cd10 and negative for HEPAR, AFP, CEA, WT-1, cdx-2, ER, PR, GCDFP and TTF-1) Cancer type ID: 96% pancreaticobiliary origin, 60% cholangiocarcinoma, 36% gallbladder adenocarcinoma  Sigmoid colectomy 02/09/2016 for recurrent diverticulitis with  chronic strictures, 4lymph nodes negative -------------------------------------------------------------------------------------------------------------------------------------------------------------  CT abdomen 09/15/2018:CT abdomen: Continued decrease in the size of the posterior right lobe liver mass measuring 1.5 x 1.2 x 0.9 cm, previously it was 1.7 x 1.5 x 1.7 cm  CT abdomen pelvis 09/15/2019: No findings of recurrent malignancy.  Stable appearance of right hepatic lobe exophytic lesion 1.5 x 1.2 cm, additional benign findings  Based on stability of these findings there is no additional treatment requirements.   We will continue to monitor her once a year with CT scans.      No orders of the defined types were placed in this encounter.  The patient has a good understanding of the overall plan. she agrees with it. she will call with any problems that may develop before the next visit here.  Total time spent: 20 mins including face to face time and time spent for planning, charting and coordination of care  Nicholas Lose, MD 09/18/2019  I, Cloyde Reams Dorshimer, am acting as scribe for Dr. Nicholas Lose.  I have reviewed the above documentation for accuracy and completeness, and I agree with the above.

## 2019-09-18 ENCOUNTER — Other Ambulatory Visit: Payer: Self-pay

## 2019-09-18 ENCOUNTER — Inpatient Hospital Stay (HOSPITAL_BASED_OUTPATIENT_CLINIC_OR_DEPARTMENT_OTHER): Payer: Medicare Other | Admitting: Hematology and Oncology

## 2019-09-18 ENCOUNTER — Telehealth: Payer: Self-pay | Admitting: Hematology and Oncology

## 2019-09-18 DIAGNOSIS — C787 Secondary malignant neoplasm of liver and intrahepatic bile duct: Secondary | ICD-10-CM

## 2019-09-18 DIAGNOSIS — K769 Liver disease, unspecified: Secondary | ICD-10-CM | POA: Diagnosis not present

## 2019-09-18 DIAGNOSIS — Z8505 Personal history of malignant neoplasm of liver: Secondary | ICD-10-CM | POA: Diagnosis not present

## 2019-09-18 NOTE — Telephone Encounter (Signed)
I talk with patient regarding schedule  

## 2019-09-18 NOTE — Assessment & Plan Note (Signed)
Liver partial resection segment 7 and 8 06/14/2015: Poorly differentiated carcinoma 8.6 cm, margins negative,positive for ck8/18, ck 7, partially positive for ck20, polyclonalCEA, cd10 and negative for HEPAR, AFP, CEA, WT-1, cdx-2, ER, PR, GCDFP and TTF-1) Cancer type ID: 96% pancreaticobiliary origin, 60% cholangiocarcinoma, 36% gallbladder adenocarcinoma  Sigmoid colectomy 02/09/2016 for recurrent diverticulitis with chronic strictures, 4lymph nodes negative -------------------------------------------------------------------------------------------------------------------------------------------------------------  CT abdomen 09/15/2018:CT abdomen: Continued decrease in the size of the posterior right lobe liver mass measuring 1.5 x 1.2 x 0.9 cm, previously it was 1.7 x 1.5 x 1.7 cm  CT abdomen pelvis 09/15/2019: No findings of recurrent malignancy.  Stable appearance of right hepatic lobe exophytic lesion 1.5 x 1.2 cm, additional benign findings  Based on stability of these findings there is no additional treatment requirements.   We will continue to monitor her once a year with CT scans.

## 2019-09-21 DIAGNOSIS — Z23 Encounter for immunization: Secondary | ICD-10-CM | POA: Diagnosis not present

## 2019-11-18 DIAGNOSIS — E46 Unspecified protein-calorie malnutrition: Secondary | ICD-10-CM | POA: Diagnosis not present

## 2019-11-18 DIAGNOSIS — G3184 Mild cognitive impairment, so stated: Secondary | ICD-10-CM | POA: Diagnosis not present

## 2019-11-18 DIAGNOSIS — K5720 Diverticulitis of large intestine with perforation and abscess without bleeding: Secondary | ICD-10-CM | POA: Diagnosis not present

## 2019-11-18 DIAGNOSIS — C229 Malignant neoplasm of liver, not specified as primary or secondary: Secondary | ICD-10-CM | POA: Diagnosis not present

## 2019-11-18 DIAGNOSIS — E559 Vitamin D deficiency, unspecified: Secondary | ICD-10-CM | POA: Diagnosis not present

## 2019-11-18 DIAGNOSIS — I129 Hypertensive chronic kidney disease with stage 1 through stage 4 chronic kidney disease, or unspecified chronic kidney disease: Secondary | ICD-10-CM | POA: Diagnosis not present

## 2019-11-18 DIAGNOSIS — N189 Chronic kidney disease, unspecified: Secondary | ICD-10-CM | POA: Diagnosis not present

## 2019-11-18 DIAGNOSIS — E039 Hypothyroidism, unspecified: Secondary | ICD-10-CM | POA: Diagnosis not present

## 2019-11-26 DIAGNOSIS — K219 Gastro-esophageal reflux disease without esophagitis: Secondary | ICD-10-CM | POA: Diagnosis not present

## 2019-11-26 DIAGNOSIS — N1832 Chronic kidney disease, stage 3b: Secondary | ICD-10-CM | POA: Diagnosis not present

## 2019-11-26 DIAGNOSIS — I129 Hypertensive chronic kidney disease with stage 1 through stage 4 chronic kidney disease, or unspecified chronic kidney disease: Secondary | ICD-10-CM | POA: Diagnosis not present

## 2019-11-26 DIAGNOSIS — E039 Hypothyroidism, unspecified: Secondary | ICD-10-CM | POA: Diagnosis not present

## 2020-02-11 DIAGNOSIS — N3020 Other chronic cystitis without hematuria: Secondary | ICD-10-CM | POA: Diagnosis not present

## 2020-03-24 DIAGNOSIS — N3020 Other chronic cystitis without hematuria: Secondary | ICD-10-CM | POA: Diagnosis not present

## 2020-05-06 DIAGNOSIS — R1110 Vomiting, unspecified: Secondary | ICD-10-CM | POA: Diagnosis not present

## 2020-05-06 DIAGNOSIS — Z853 Personal history of malignant neoplasm of breast: Secondary | ICD-10-CM | POA: Diagnosis not present

## 2020-05-06 DIAGNOSIS — K432 Incisional hernia without obstruction or gangrene: Secondary | ICD-10-CM | POA: Diagnosis not present

## 2020-05-06 DIAGNOSIS — C229 Malignant neoplasm of liver, not specified as primary or secondary: Secondary | ICD-10-CM | POA: Diagnosis not present

## 2020-05-18 DIAGNOSIS — E039 Hypothyroidism, unspecified: Secondary | ICD-10-CM | POA: Diagnosis not present

## 2020-05-18 DIAGNOSIS — Z853 Personal history of malignant neoplasm of breast: Secondary | ICD-10-CM | POA: Diagnosis not present

## 2020-05-18 DIAGNOSIS — E559 Vitamin D deficiency, unspecified: Secondary | ICD-10-CM | POA: Diagnosis not present

## 2020-05-18 DIAGNOSIS — N189 Chronic kidney disease, unspecified: Secondary | ICD-10-CM | POA: Diagnosis not present

## 2020-05-18 DIAGNOSIS — I129 Hypertensive chronic kidney disease with stage 1 through stage 4 chronic kidney disease, or unspecified chronic kidney disease: Secondary | ICD-10-CM | POA: Diagnosis not present

## 2020-05-18 DIAGNOSIS — E119 Type 2 diabetes mellitus without complications: Secondary | ICD-10-CM | POA: Diagnosis not present

## 2020-05-18 DIAGNOSIS — C229 Malignant neoplasm of liver, not specified as primary or secondary: Secondary | ICD-10-CM | POA: Diagnosis not present

## 2020-05-18 DIAGNOSIS — Z7982 Long term (current) use of aspirin: Secondary | ICD-10-CM | POA: Diagnosis not present

## 2020-05-18 DIAGNOSIS — K5720 Diverticulitis of large intestine with perforation and abscess without bleeding: Secondary | ICD-10-CM | POA: Diagnosis not present

## 2020-05-18 DIAGNOSIS — I872 Venous insufficiency (chronic) (peripheral): Secondary | ICD-10-CM | POA: Diagnosis not present

## 2020-05-18 DIAGNOSIS — E785 Hyperlipidemia, unspecified: Secondary | ICD-10-CM | POA: Diagnosis not present

## 2020-05-18 DIAGNOSIS — E46 Unspecified protein-calorie malnutrition: Secondary | ICD-10-CM | POA: Diagnosis not present

## 2020-06-07 DIAGNOSIS — K219 Gastro-esophageal reflux disease without esophagitis: Secondary | ICD-10-CM | POA: Diagnosis not present

## 2020-06-07 DIAGNOSIS — I129 Hypertensive chronic kidney disease with stage 1 through stage 4 chronic kidney disease, or unspecified chronic kidney disease: Secondary | ICD-10-CM | POA: Diagnosis not present

## 2020-06-07 DIAGNOSIS — E039 Hypothyroidism, unspecified: Secondary | ICD-10-CM | POA: Diagnosis not present

## 2020-06-07 DIAGNOSIS — N1832 Chronic kidney disease, stage 3b: Secondary | ICD-10-CM | POA: Diagnosis not present

## 2020-06-08 DIAGNOSIS — N398 Other specified disorders of urinary system: Secondary | ICD-10-CM | POA: Diagnosis not present

## 2020-06-15 DIAGNOSIS — Z23 Encounter for immunization: Secondary | ICD-10-CM | POA: Diagnosis not present

## 2020-06-15 DIAGNOSIS — R300 Dysuria: Secondary | ICD-10-CM | POA: Diagnosis not present

## 2020-06-15 DIAGNOSIS — R350 Frequency of micturition: Secondary | ICD-10-CM | POA: Diagnosis not present

## 2020-06-17 DIAGNOSIS — N3000 Acute cystitis without hematuria: Secondary | ICD-10-CM | POA: Diagnosis not present

## 2020-06-17 DIAGNOSIS — N3020 Other chronic cystitis without hematuria: Secondary | ICD-10-CM | POA: Diagnosis not present

## 2020-07-03 DIAGNOSIS — Z23 Encounter for immunization: Secondary | ICD-10-CM | POA: Diagnosis not present

## 2020-08-03 ENCOUNTER — Telehealth: Payer: Self-pay | Admitting: Hematology and Oncology

## 2020-08-03 NOTE — Telephone Encounter (Signed)
Rescheduled appointment due to provider PAL. Patient's daughter is aware of changes.

## 2020-08-26 DIAGNOSIS — R300 Dysuria: Secondary | ICD-10-CM | POA: Diagnosis not present

## 2020-09-01 DIAGNOSIS — N3281 Overactive bladder: Secondary | ICD-10-CM | POA: Diagnosis not present

## 2020-09-01 DIAGNOSIS — N3020 Other chronic cystitis without hematuria: Secondary | ICD-10-CM | POA: Diagnosis not present

## 2020-09-12 ENCOUNTER — Other Ambulatory Visit: Payer: Self-pay | Admitting: *Deleted

## 2020-09-12 ENCOUNTER — Other Ambulatory Visit: Payer: Self-pay

## 2020-09-12 DIAGNOSIS — C787 Secondary malignant neoplasm of liver and intrahepatic bile duct: Secondary | ICD-10-CM

## 2020-09-13 ENCOUNTER — Other Ambulatory Visit: Payer: Self-pay

## 2020-09-13 ENCOUNTER — Inpatient Hospital Stay: Payer: Medicare Other | Attending: Hematology and Oncology

## 2020-09-13 ENCOUNTER — Ambulatory Visit (HOSPITAL_COMMUNITY)
Admission: RE | Admit: 2020-09-13 | Discharge: 2020-09-13 | Disposition: A | Discharge status: Home / Self Care | Payer: Medicare Other | Source: Ambulatory Visit | Attending: Hematology and Oncology | Admitting: Hematology and Oncology

## 2020-09-13 DIAGNOSIS — K769 Liver disease, unspecified: Secondary | ICD-10-CM | POA: Insufficient documentation

## 2020-09-13 DIAGNOSIS — N200 Calculus of kidney: Secondary | ICD-10-CM | POA: Diagnosis not present

## 2020-09-13 DIAGNOSIS — C787 Secondary malignant neoplasm of liver and intrahepatic bile duct: Secondary | ICD-10-CM

## 2020-09-13 DIAGNOSIS — K838 Other specified diseases of biliary tract: Secondary | ICD-10-CM | POA: Diagnosis not present

## 2020-09-13 DIAGNOSIS — N3289 Other specified disorders of bladder: Secondary | ICD-10-CM | POA: Diagnosis not present

## 2020-09-13 DIAGNOSIS — N281 Cyst of kidney, acquired: Secondary | ICD-10-CM | POA: Diagnosis not present

## 2020-09-13 LAB — CBC WITH DIFFERENTIAL (CANCER CENTER ONLY)
Abs Immature Granulocytes: 0.02 10*3/uL (ref 0.00–0.07)
Basophils Absolute: 0.1 10*3/uL (ref 0.0–0.1)
Basophils Relative: 1 %
Eosinophils Absolute: 0.1 10*3/uL (ref 0.0–0.5)
Eosinophils Relative: 2 %
HCT: 41.8 % (ref 36.0–46.0)
Hemoglobin: 13.4 g/dL (ref 12.0–15.0)
Immature Granulocytes: 0 %
Lymphocytes Relative: 17 %
Lymphs Abs: 1.1 10*3/uL (ref 0.7–4.0)
MCH: 28.9 pg (ref 26.0–34.0)
MCHC: 32.1 g/dL (ref 30.0–36.0)
MCV: 90.3 fL (ref 80.0–100.0)
Monocytes Absolute: 0.5 10*3/uL (ref 0.1–1.0)
Monocytes Relative: 8 %
Neutro Abs: 4.7 10*3/uL (ref 1.7–7.7)
Neutrophils Relative %: 72 %
Platelet Count: 144 10*3/uL — ABNORMAL LOW (ref 150–400)
RBC: 4.63 MIL/uL (ref 3.87–5.11)
RDW: 13.2 % (ref 11.5–15.5)
WBC Count: 6.4 10*3/uL (ref 4.0–10.5)
nRBC: 0 % (ref 0.0–0.2)

## 2020-09-13 LAB — CMP (CANCER CENTER ONLY)
ALT: 11 U/L (ref 0–44)
AST: 13 U/L — ABNORMAL LOW (ref 15–41)
Albumin: 3.6 g/dL (ref 3.5–5.0)
Alkaline Phosphatase: 83 U/L (ref 38–126)
Anion gap: 5 (ref 5–15)
BUN: 24 mg/dL — ABNORMAL HIGH (ref 8–23)
CO2: 28 mmol/L (ref 22–32)
Calcium: 9.9 mg/dL (ref 8.9–10.3)
Chloride: 107 mmol/L (ref 98–111)
Creatinine: 1.45 mg/dL — ABNORMAL HIGH (ref 0.44–1.00)
GFR, Estimated: 34 mL/min — ABNORMAL LOW (ref >60)
Glucose, Bld: 111 mg/dL — ABNORMAL HIGH (ref 70–99)
Potassium: 4.2 mmol/L (ref 3.5–5.1)
Sodium: 140 mmol/L (ref 135–145)
Total Bilirubin: 0.5 mg/dL (ref 0.3–1.2)
Total Protein: 7.5 g/dL (ref 6.5–8.1)

## 2020-09-13 MED ORDER — IOHEXOL 300 MG/ML  SOLN
75.0000 mL | Freq: Once | INTRAMUSCULAR | Status: AC | PRN
  Administered 2020-09-13: 75 mL via INTRAVENOUS

## 2020-09-15 ENCOUNTER — Telehealth: Payer: Self-pay | Admitting: *Deleted

## 2020-09-15 NOTE — Telephone Encounter (Signed)
Per Annabelle Harman, called to make pt aware that scan shows no cancer. But does show inflammation of the bladder. Pt stated she's not having any symptoms or complaints. Advised to call if she would like to come in for UA and urine culture. Pt verbalized understanding.

## 2020-09-19 ENCOUNTER — Ambulatory Visit: Payer: Medicare Other | Admitting: Hematology and Oncology

## 2020-09-20 ENCOUNTER — Telehealth: Payer: Self-pay | Admitting: Hematology and Oncology

## 2020-09-20 ENCOUNTER — Encounter: Payer: Self-pay | Admitting: *Deleted

## 2020-09-20 NOTE — Telephone Encounter (Signed)
Rescheduled from 2/9 per provider. Called and spoke with pt, daughter,confirmed 2/18 appt

## 2020-09-20 NOTE — Progress Notes (Signed)
Received call from pt daughter Geanie Kenning requesting recent abd CT be sent to Dr. Louis Meckel with [facility] for further evaluation.  Results successfully faxed to [phone].

## 2020-09-21 ENCOUNTER — Inpatient Hospital Stay: Payer: Medicare Other | Admitting: Hematology and Oncology

## 2020-09-29 ENCOUNTER — Ambulatory Visit: Payer: Medicare Other | Admitting: Hematology and Oncology

## 2020-09-29 NOTE — Progress Notes (Signed)
Patient Care Team: Nicholas Lose, MD as PCP - General (Hematology and Oncology) [name], MD as Consulting Physician (General Surgery)  DIAGNOSIS:    ICD-10-CM   1. Liver metastasis ([place])  C78.7     SUMMARY OF ONCOLOGIC HISTORY: Oncology History  Liver metastasis ([place])  05/03/2015 Imaging   CT abdomen: Bilobed 8.1 cm mass in the liver segment 5. 3.8 cm fluid collection posterior and medial: Favoring adnexal cyst   05/04/2015 Initial Diagnosis   Poorly differentiated carcinoma strongly positive for CK 8/18, CK 7 and MOC-31; HPAR faint cytoplasmic staining; (negative for ER, PR, AFP, TTF-1, WT 1, p63, CD10, CDX 2, CK 20, CK 5/6, mucicarmine)   05/17/2015 PET scan   Hypermetabolic partially necrotic right liver lobe mass , hypermetabolic some within the descending/sigmoid colon junction in the region of inflammation/ diverticulitis versus cancer   06/14/2015 Surgery   liver partial resection segment 7 and 8: Poorly differentiated carcinoma 8.6 cm, margins negative,positive for ck8/18, ck 7, partially positive for ck20, polyclonalCEA, cd10 and negative for HEPAR, AFP, Monoclonal CEA, WT-1, cdx-2, ER, PR, GCDFP and TTF   06/14/2015 Procedure   cancer type ID: 96% pancreaticobiliary origin, 60% cholangiocarcinoma, 36% gallbladder adenocarcinoma   02/09/2016 Surgery   Sigmoid colectomy for recurrent diverticulitis with chronic stricture; no malignancy 4 lymph nodes negative   11/28/2016 Relapse/Recurrence   New right hepatic lobe lesion worrisome for recurrent tumor 4 cm   01/01/2017 - 01/09/2017 Radiation Therapy   SBRT to liver   03/18/2017 Imaging   Hypodense liver lesion smaller (was 3.1 cm, now 2.8 cm), No other new metastatic lesions noted.    09/15/2018 Imaging   CT abdomen: Continued decrease in the size of the posterior right lobe liver mass measuring 1.5 x 1.2 x 0.9 cm, previously it was 1.7 x 1.5 x 1.7 cm      CHIEF COMPLIANT: Follow-up poorly differentiated  carcinoma of the liver  INTERVAL HISTORY: Destiny Velasquez is a 87 y.o. with above-mentioned history of poorly differentiated carcinoma involving the liver who underwent liver resection and is currently on surveillance. CT abdomen/pelvis on 09/13/20 showed no evidence of recurrence or metastatic disease. She presents to the clinic today for follow-up.  She has a bruise on her right leg on the posterior aspect.  She tells me that she has had it for several years that it does not bother her.  She uses a walker to get around.  She tells me that she has been staying active.  She has noticed bilateral lower extremity edema left greater than right.  ALLERGIES:  is allergic to levaquin [levofloxacin].  MEDICATIONS:  Current Outpatient Medications  Medication Sig Dispense Refill   acetaminophen (TYLENOL) 325 MG tablet Take 650 mg by mouth every 6 (six) hours as needed (pain).      aspirin 81 MG tablet Take 81 mg by mouth daily.      bisacodyl (DULCOLAX) 5 MG EC tablet Take 5 mg by mouth once. Took 4 times for bowel prep     Cranberry (ELLURA) 200 MG CAPS Take 200 mg by mouth daily.     feeding supplement (BOOST / RESOURCE BREEZE) LIQD Take 1 Container by mouth 2 (two) times daily between meals.      ibuprofen (ADVIL,MOTRIN) 400 MG tablet Take 400 mg by mouth every 6 (six) hours as needed.     iron polysaccharides (NIFEREX) 150 MG capsule Take 150 mg by mouth daily.     levothyroxine (SYNTHROID, LEVOTHROID) 100 MCG tablet Take  100 mcg by mouth daily before breakfast.     omeprazole (PRILOSEC) 20 MG capsule Take 20 mg by mouth daily.     polyethylene glycol (MIRALAX / GLYCOLAX) packet Take 17 g by mouth daily.     simethicone (MYLICON) 628 MG chewable tablet Chew 125 mg by mouth 3 (three) times daily before meals.     UNABLE TO FIND Med Name: neomycin     Vitamin D, Ergocalciferol, (DRISDOL) 50000 units CAPS capsule Take 50,000 Units by mouth every 7 (seven) days.     No current  facility-administered medications for this visit.    PHYSICAL EXAMINATION: ECOG PERFORMANCE STATUS: 1 - Symptomatic but completely ambulatory  Vitals:   09/30/20 1131  BP: (!) 125/58  Pulse: 69  Resp: 18  Temp: 97.9 F (36.6 C)  SpO2: 98%   Filed Weights   09/30/20 1131  Weight: 154 lb 3.2 oz (69.9 kg)     LABORATORY DATA:  I have reviewed the data as listed CMP Latest Ref Rng & Units 09/13/2020 09/15/2019 09/15/2018  Glucose 70 - 99 mg/dL 111(H) 74 147(H)  BUN 8 - 23 mg/dL 24(H) 26(H) 26(H)  Creatinine 0.44 - 1.00 mg/dL 1.45(H) 1.48(H) 1.52(H)  Sodium 135 - 145 mmol/L 140 142 139  Potassium 3.5 - 5.1 mmol/L 4.2 4.1 4.2  Chloride 98 - 111 mmol/L 107 107 105  CO2 22 - 32 mmol/L 28 28 26   Calcium 8.9 - 10.3 mg/dL 9.9 10.3 9.9  Total Protein 6.5 - 8.1 g/dL 7.5 7.7 7.1  Total Bilirubin 0.3 - 1.2 mg/dL 0.5 0.3 0.4  Alkaline Phos 38 - 126 U/L 83 84 78  AST 15 - 41 U/L 13(L) 13(L) 13(L)  ALT 0 - 44 U/L 11 13 13     Lab Results  Component Value Date   WBC 6.4 09/13/2020   HGB 13.4 09/13/2020   HCT 41.8 09/13/2020   MCV 90.3 09/13/2020   PLT 144 (L) 09/13/2020   NEUTROABS 4.7 09/13/2020    ASSESSMENT & PLAN:  Liver metastasis ([facility]) Liver partial resection segment 7 and 8 06/14/2015: Poorly differentiated carcinoma 8.6 cm, margins negative,positive for ck8/18, ck 7, partially positive for ck20, polyclonalCEA, cd10 and negative for HEPAR, AFP, CEA, WT-1, cdx-2, ER, PR, GCDFP and TTF-1) Cancer type ID: 96% pancreaticobiliary origin, 60% cholangiocarcinoma, 36% gallbladder adenocarcinoma  Sigmoid colectomy 02/09/2016 for recurrent diverticulitis with chronic strictures, 4lymph nodes negative -------------------------------------------------------------------------------------------------------------------------------------------------------------  CT abdomen 09/15/2018:CT abdomen: Continued decrease in the size of the posterior right lobe liver mass measuring 1.5 x 1.2 x 0.9  cm, previously it was 1.7 x 1.5 x 1.7 cm  CT abdomen pelvis 09/15/2019: No findings of recurrent malignancy.  Stable appearance of right hepatic lobe exophytic lesion 1.5 x 1.2 cm, additional benign findings  CT abdomen pelvis 09/13/2020: Unchanged postoperative changes in the liver.  Thickening of bladder?  Cystitis tiny bilateral kidney stones  Based on stability of these findings there is no additional treatment requirements.    Bilateral lower extremity edema: I suspect slight deconditioning as a cause of this.  I do not suspect blood clots.  I recommended watchful monitoring.  She will wear elastic compression stockings.  We discussed about not getting any further CT scans. Follow-up in 1 year with labs.     No orders of the defined types were placed in this encounter.  The patient has a good understanding of the overall plan. she agrees with it. she will call with any problems that may develop before the next  visit here.  Total time spent: 20 mins including face to face time and time spent for planning, charting and coordination of care  Rulon Eisenmenger, MD, MPH 09/30/2020  I, Molly Dorshimer, am acting as scribe for Dr. Nicholas Lose.  I have reviewed the above documentation for accuracy and completeness, and I agree with the above.

## 2020-09-30 ENCOUNTER — Telehealth: Payer: Self-pay | Admitting: Hematology and Oncology

## 2020-09-30 ENCOUNTER — Inpatient Hospital Stay (HOSPITAL_BASED_OUTPATIENT_CLINIC_OR_DEPARTMENT_OTHER): Payer: Medicare Other | Admitting: Hematology and Oncology

## 2020-09-30 ENCOUNTER — Other Ambulatory Visit: Payer: Self-pay

## 2020-09-30 DIAGNOSIS — C787 Secondary malignant neoplasm of liver and intrahepatic bile duct: Secondary | ICD-10-CM | POA: Diagnosis not present

## 2020-09-30 DIAGNOSIS — C801 Malignant (primary) neoplasm, unspecified: Secondary | ICD-10-CM | POA: Insufficient documentation

## 2020-09-30 DIAGNOSIS — R600 Localized edema: Secondary | ICD-10-CM | POA: Insufficient documentation

## 2020-09-30 NOTE — Assessment & Plan Note (Signed)
Liver partial resection segment 7 and 8 06/14/2015: Poorly differentiated carcinoma 8.6 cm, margins negative,positive for ck8/18, ck 7, partially positive for ck20, polyclonalCEA, cd10 and negative for HEPAR, AFP, CEA, WT-1, cdx-2, ER, PR, GCDFP and TTF-1) Cancer type ID: 96% pancreaticobiliary origin, 60% cholangiocarcinoma, 36% gallbladder adenocarcinoma  Sigmoid colectomy 02/09/2016 for recurrent diverticulitis with chronic strictures, 4lymph nodes negative -------------------------------------------------------------------------------------------------------------------------------------------------------------  CT abdomen 09/15/2018:CT abdomen: Continued decrease in the size of the posterior right lobe liver mass measuring 1.5 x 1.2 x 0.9 cm, previously it was 1.7 x 1.5 x 1.7 cm  CT abdomen pelvis 09/15/2019: No findings of recurrent malignancy.  Stable appearance of right hepatic lobe exophytic lesion 1.5 x 1.2 cm, additional benign findings  CT abdomen pelvis 09/13/2020: Unchanged postoperative changes in the liver.  Thickening of bladder?  Cystitis tiny bilateral kidney stones  Based on stability of these findings there is no additional treatment requirements.   We will continue to monitor her once a year with CT scans.

## 2020-10-06 DIAGNOSIS — N3020 Other chronic cystitis without hematuria: Secondary | ICD-10-CM | POA: Diagnosis not present

## 2020-10-06 DIAGNOSIS — N3281 Overactive bladder: Secondary | ICD-10-CM | POA: Diagnosis not present

## 2020-10-27 DIAGNOSIS — N3020 Other chronic cystitis without hematuria: Secondary | ICD-10-CM | POA: Diagnosis not present

## 2020-11-12 ENCOUNTER — Telehealth: Payer: Self-pay | Admitting: Urology

## 2020-11-12 DIAGNOSIS — N398 Other specified disorders of urinary system: Secondary | ICD-10-CM | POA: Diagnosis not present

## 2020-11-12 DIAGNOSIS — R309 Painful micturition, unspecified: Secondary | ICD-10-CM | POA: Diagnosis not present

## 2020-11-12 MED ORDER — CEFDINIR 300 MG PO CAPS: 300.0000 mg | ORAL_CAPSULE | Freq: Two times a day (BID) | ORAL | 0 refills | Status: AC

## 2020-11-12 NOTE — Telephone Encounter (Signed)
Returned call from patient's daughter. She reports that patient was recently treated for a UTI with last abx 2 weeks ago (Ucx 3/17 NG). Starting yesterday, patient developed dysuria, suprapubic pain, and urinary frequency c/f new UTI. I asked her to please have her nursing facility collect and send a urine specimen for urine culture. I will empirically prescribe omnicef 300mg  BID x 10d per only positive urine culture in EMR (E Coli on 06/2015 in Epic). I instructed her daughter that the patient should only start the antibiotic AFTER the urine is collected for culture.

## 2020-11-16 DIAGNOSIS — E559 Vitamin D deficiency, unspecified: Secondary | ICD-10-CM | POA: Diagnosis not present

## 2020-11-16 DIAGNOSIS — E039 Hypothyroidism, unspecified: Secondary | ICD-10-CM | POA: Diagnosis not present

## 2020-11-16 DIAGNOSIS — E46 Unspecified protein-calorie malnutrition: Secondary | ICD-10-CM | POA: Diagnosis not present

## 2020-11-16 DIAGNOSIS — D649 Anemia, unspecified: Secondary | ICD-10-CM | POA: Diagnosis not present

## 2020-12-15 DIAGNOSIS — N3020 Other chronic cystitis without hematuria: Secondary | ICD-10-CM | POA: Diagnosis not present

## 2020-12-15 DIAGNOSIS — R8279 Other abnormal findings on microbiological examination of urine: Secondary | ICD-10-CM | POA: Diagnosis not present

## 2020-12-29 DIAGNOSIS — N390 Urinary tract infection, site not specified: Secondary | ICD-10-CM | POA: Diagnosis not present

## 2020-12-29 DIAGNOSIS — N1832 Chronic kidney disease, stage 3b: Secondary | ICD-10-CM | POA: Diagnosis not present

## 2020-12-29 DIAGNOSIS — E039 Hypothyroidism, unspecified: Secondary | ICD-10-CM | POA: Diagnosis not present

## 2020-12-29 DIAGNOSIS — I129 Hypertensive chronic kidney disease with stage 1 through stage 4 chronic kidney disease, or unspecified chronic kidney disease: Secondary | ICD-10-CM | POA: Diagnosis not present

## 2020-12-29 DIAGNOSIS — C229 Malignant neoplasm of liver, not specified as primary or secondary: Secondary | ICD-10-CM | POA: Diagnosis not present

## 2020-12-29 DIAGNOSIS — K219 Gastro-esophageal reflux disease without esophagitis: Secondary | ICD-10-CM | POA: Diagnosis not present

## 2020-12-30 DIAGNOSIS — I129 Hypertensive chronic kidney disease with stage 1 through stage 4 chronic kidney disease, or unspecified chronic kidney disease: Secondary | ICD-10-CM | POA: Diagnosis not present

## 2020-12-30 DIAGNOSIS — N189 Chronic kidney disease, unspecified: Secondary | ICD-10-CM | POA: Diagnosis not present

## 2021-02-01 DIAGNOSIS — E039 Hypothyroidism, unspecified: Secondary | ICD-10-CM | POA: Diagnosis not present

## 2021-03-09 DIAGNOSIS — N3281 Overactive bladder: Secondary | ICD-10-CM | POA: Diagnosis not present

## 2021-03-09 DIAGNOSIS — N3020 Other chronic cystitis without hematuria: Secondary | ICD-10-CM | POA: Diagnosis not present

## 2021-03-23 DIAGNOSIS — K648 Other hemorrhoids: Secondary | ICD-10-CM | POA: Diagnosis not present

## 2021-03-23 DIAGNOSIS — K5900 Constipation, unspecified: Secondary | ICD-10-CM | POA: Diagnosis not present

## 2021-04-12 DIAGNOSIS — K5900 Constipation, unspecified: Secondary | ICD-10-CM | POA: Diagnosis not present

## 2021-04-12 DIAGNOSIS — K5641 Fecal impaction: Secondary | ICD-10-CM | POA: Diagnosis not present

## 2021-04-12 DIAGNOSIS — K6289 Other specified diseases of anus and rectum: Secondary | ICD-10-CM | POA: Diagnosis not present

## 2021-04-13 DIAGNOSIS — D649 Anemia, unspecified: Secondary | ICD-10-CM | POA: Diagnosis not present

## 2021-05-16 DIAGNOSIS — N3020 Other chronic cystitis without hematuria: Secondary | ICD-10-CM | POA: Diagnosis not present

## 2021-05-17 DIAGNOSIS — E46 Unspecified protein-calorie malnutrition: Secondary | ICD-10-CM | POA: Diagnosis not present

## 2021-05-17 DIAGNOSIS — E039 Hypothyroidism, unspecified: Secondary | ICD-10-CM | POA: Diagnosis not present

## 2021-05-17 DIAGNOSIS — C229 Malignant neoplasm of liver, not specified as primary or secondary: Secondary | ICD-10-CM | POA: Diagnosis not present

## 2021-05-17 DIAGNOSIS — I129 Hypertensive chronic kidney disease with stage 1 through stage 4 chronic kidney disease, or unspecified chronic kidney disease: Secondary | ICD-10-CM | POA: Diagnosis not present

## 2021-05-17 DIAGNOSIS — N189 Chronic kidney disease, unspecified: Secondary | ICD-10-CM | POA: Diagnosis not present

## 2021-05-17 DIAGNOSIS — E559 Vitamin D deficiency, unspecified: Secondary | ICD-10-CM | POA: Diagnosis not present

## 2021-05-26 DIAGNOSIS — Z23 Encounter for immunization: Secondary | ICD-10-CM | POA: Diagnosis not present

## 2021-06-01 DIAGNOSIS — K5900 Constipation, unspecified: Secondary | ICD-10-CM | POA: Diagnosis not present

## 2021-06-01 DIAGNOSIS — K6289 Other specified diseases of anus and rectum: Secondary | ICD-10-CM | POA: Diagnosis not present

## 2021-06-02 DIAGNOSIS — Z23 Encounter for immunization: Secondary | ICD-10-CM | POA: Diagnosis not present

## 2021-07-10 DIAGNOSIS — E039 Hypothyroidism, unspecified: Secondary | ICD-10-CM | POA: Diagnosis not present

## 2021-07-10 DIAGNOSIS — K219 Gastro-esophageal reflux disease without esophagitis: Secondary | ICD-10-CM | POA: Diagnosis not present

## 2021-07-10 DIAGNOSIS — N390 Urinary tract infection, site not specified: Secondary | ICD-10-CM | POA: Diagnosis not present

## 2021-07-10 DIAGNOSIS — I10 Essential (primary) hypertension: Secondary | ICD-10-CM | POA: Diagnosis not present

## 2021-09-04 ENCOUNTER — Telehealth: Payer: Self-pay

## 2021-09-04 ENCOUNTER — Other Ambulatory Visit: Payer: Self-pay

## 2021-09-04 DIAGNOSIS — C787 Secondary malignant neoplasm of liver and intrahepatic bile duct: Secondary | ICD-10-CM

## 2021-09-04 NOTE — Telephone Encounter (Signed)
Called, spoke with pt daughter [name].  Advised her of upcoming appointments and NPO/prep for CT scan.  [name] verbalized understanding and thanks

## 2021-09-07 DIAGNOSIS — N3000 Acute cystitis without hematuria: Secondary | ICD-10-CM | POA: Diagnosis not present

## 2021-09-07 DIAGNOSIS — N3281 Overactive bladder: Secondary | ICD-10-CM | POA: Diagnosis not present

## 2021-09-14 DIAGNOSIS — K5900 Constipation, unspecified: Secondary | ICD-10-CM | POA: Diagnosis not present

## 2021-09-14 DIAGNOSIS — K649 Unspecified hemorrhoids: Secondary | ICD-10-CM | POA: Diagnosis not present

## 2021-09-15 ENCOUNTER — Other Ambulatory Visit: Payer: Self-pay

## 2021-09-15 ENCOUNTER — Other Ambulatory Visit: Payer: Self-pay | Admitting: *Deleted

## 2021-09-15 DIAGNOSIS — C787 Secondary malignant neoplasm of liver and intrahepatic bile duct: Secondary | ICD-10-CM

## 2021-09-17 DIAGNOSIS — R059 Cough, unspecified: Secondary | ICD-10-CM | POA: Diagnosis not present

## 2021-09-17 DIAGNOSIS — R0989 Other specified symptoms and signs involving the circulatory and respiratory systems: Secondary | ICD-10-CM | POA: Diagnosis not present

## 2021-09-18 ENCOUNTER — Telehealth: Payer: Self-pay | Admitting: Hematology and Oncology

## 2021-09-18 ENCOUNTER — Ambulatory Visit (HOSPITAL_COMMUNITY): Admission: RE | Admit: 2021-09-18 | Payer: Medicare Other | Source: Ambulatory Visit

## 2021-09-18 ENCOUNTER — Inpatient Hospital Stay: Payer: Medicare Other

## 2021-09-18 NOTE — Telephone Encounter (Signed)
[place] PER 2/6 inbasket, pt daughter aware

## 2021-09-19 ENCOUNTER — Other Ambulatory Visit: Payer: Self-pay | Admitting: *Deleted

## 2021-09-19 ENCOUNTER — Telehealth: Payer: Self-pay | Admitting: *Deleted

## 2021-09-19 NOTE — Telephone Encounter (Signed)
This RN received request to reschedule CT to 2/23- obtained and called the patient's daughter per new appt - obtained identified VM- detailed message left.

## 2021-09-28 ENCOUNTER — Other Ambulatory Visit (HOSPITAL_COMMUNITY): Payer: Medicare Other

## 2021-09-28 ENCOUNTER — Inpatient Hospital Stay: Payer: Medicare Other | Admitting: Hematology and Oncology

## 2021-09-28 ENCOUNTER — Inpatient Hospital Stay: Payer: Medicare Other

## 2021-10-02 ENCOUNTER — Other Ambulatory Visit: Payer: Medicare Other

## 2021-10-02 ENCOUNTER — Ambulatory Visit (HOSPITAL_COMMUNITY): Payer: Medicare Other

## 2021-10-04 ENCOUNTER — Ambulatory Visit: Payer: Medicare Other | Admitting: Hematology and Oncology

## 2021-10-05 ENCOUNTER — Ambulatory Visit (HOSPITAL_COMMUNITY)
Admission: RE | Admit: 2021-10-05 | Discharge: 2021-10-05 | Disposition: A | Discharge status: Home / Self Care | Payer: Medicare Other | Source: Ambulatory Visit | Attending: Hematology and Oncology | Admitting: Hematology and Oncology

## 2021-10-05 ENCOUNTER — Other Ambulatory Visit: Payer: Self-pay

## 2021-10-05 ENCOUNTER — Inpatient Hospital Stay: Payer: Medicare Other | Attending: Hematology and Oncology

## 2021-10-05 ENCOUNTER — Other Ambulatory Visit (HOSPITAL_COMMUNITY): Payer: Medicare Other

## 2021-10-05 DIAGNOSIS — C787 Secondary malignant neoplasm of liver and intrahepatic bile duct: Secondary | ICD-10-CM

## 2021-10-05 DIAGNOSIS — N83202 Unspecified ovarian cyst, left side: Secondary | ICD-10-CM | POA: Diagnosis not present

## 2021-10-05 DIAGNOSIS — N200 Calculus of kidney: Secondary | ICD-10-CM | POA: Diagnosis not present

## 2021-10-05 DIAGNOSIS — J90 Pleural effusion, not elsewhere classified: Secondary | ICD-10-CM | POA: Diagnosis not present

## 2021-10-05 DIAGNOSIS — I700 Atherosclerosis of aorta: Secondary | ICD-10-CM | POA: Diagnosis not present

## 2021-10-05 DIAGNOSIS — R590 Localized enlarged lymph nodes: Secondary | ICD-10-CM | POA: Diagnosis not present

## 2021-10-05 LAB — CBC WITH DIFFERENTIAL (CANCER CENTER ONLY)
Abs Immature Granulocytes: 0.02 10*3/uL (ref 0.00–0.07)
Basophils Absolute: 0.1 10*3/uL (ref 0.0–0.1)
Basophils Relative: 1 %
Eosinophils Absolute: 0.1 10*3/uL (ref 0.0–0.5)
Eosinophils Relative: 2 %
HCT: 38.3 % (ref 36.0–46.0)
Hemoglobin: 12.5 g/dL (ref 12.0–15.0)
Immature Granulocytes: 0 %
Lymphocytes Relative: 16 %
Lymphs Abs: 1.1 10*3/uL (ref 0.7–4.0)
MCH: 27.9 pg (ref 26.0–34.0)
MCHC: 32.6 g/dL (ref 30.0–36.0)
MCV: 85.5 fL (ref 80.0–100.0)
Monocytes Absolute: 0.5 10*3/uL (ref 0.1–1.0)
Monocytes Relative: 7 %
Neutro Abs: 5.3 10*3/uL (ref 1.7–7.7)
Neutrophils Relative %: 74 %
Platelet Count: 167 10*3/uL (ref 150–400)
RBC: 4.48 MIL/uL (ref 3.87–5.11)
RDW: 13.7 % (ref 11.5–15.5)
WBC Count: 7.1 10*3/uL (ref 4.0–10.5)
nRBC: 0 % (ref 0.0–0.2)

## 2021-10-05 LAB — CMP (CANCER CENTER ONLY)
ALT: 8 U/L (ref 0–44)
AST: 10 U/L — ABNORMAL LOW (ref 15–41)
Albumin: 3.4 g/dL — ABNORMAL LOW (ref 3.5–5.0)
Alkaline Phosphatase: 96 U/L (ref 38–126)
Anion gap: 6 (ref 5–15)
BUN: 18 mg/dL (ref 8–23)
CO2: 28 mmol/L (ref 22–32)
Calcium: 10.2 mg/dL (ref 8.9–10.3)
Chloride: 104 mmol/L (ref 98–111)
Creatinine: 1.2 mg/dL — ABNORMAL HIGH (ref 0.44–1.00)
GFR, Estimated: 42 mL/min — ABNORMAL LOW (ref >60)
Glucose, Bld: 100 mg/dL — ABNORMAL HIGH (ref 70–99)
Potassium: 4.1 mmol/L (ref 3.5–5.1)
Sodium: 138 mmol/L (ref 135–145)
Total Bilirubin: 0.6 mg/dL (ref 0.3–1.2)
Total Protein: 7.3 g/dL (ref 6.5–8.1)

## 2021-10-05 MED ORDER — IOHEXOL 300 MG/ML  SOLN
80.0000 mL | Freq: Once | INTRAMUSCULAR | Status: AC | PRN
  Administered 2021-10-05: 80 mL via INTRAVENOUS

## 2021-10-05 MED ORDER — IOHEXOL 350 MG/ML SOLN: 80.0000 mL | Freq: Once | INTRAVENOUS | Status: DC | PRN

## 2021-10-05 MED ORDER — SODIUM CHLORIDE (PF) 0.9 % IJ SOLN
INTRAMUSCULAR | Status: AC
  Filled 2021-10-05: qty 50

## 2021-10-09 ENCOUNTER — Telehealth: Payer: Self-pay | Admitting: *Deleted

## 2021-10-09 NOTE — Telephone Encounter (Signed)
Received call from pt daughter and POA [name] requesting to reschedule upcoming appt.  States she will be on vacation and will not be able to provide emotional support to pt regarding CT results.  Appt rescheduled and [name] verified appt date and time.

## 2021-10-10 ENCOUNTER — Inpatient Hospital Stay: Payer: Medicare Other | Admitting: Hematology and Oncology

## 2021-10-10 DIAGNOSIS — I872 Venous insufficiency (chronic) (peripheral): Secondary | ICD-10-CM | POA: Diagnosis not present

## 2021-10-10 DIAGNOSIS — R301 Vesical tenesmus: Secondary | ICD-10-CM | POA: Diagnosis not present

## 2021-10-10 DIAGNOSIS — E039 Hypothyroidism, unspecified: Secondary | ICD-10-CM | POA: Diagnosis not present

## 2021-10-10 DIAGNOSIS — Z0000 Encounter for general adult medical examination without abnormal findings: Secondary | ICD-10-CM | POA: Diagnosis not present

## 2021-10-10 DIAGNOSIS — M1990 Unspecified osteoarthritis, unspecified site: Secondary | ICD-10-CM | POA: Diagnosis not present

## 2021-10-10 DIAGNOSIS — Z79899 Other long term (current) drug therapy: Secondary | ICD-10-CM | POA: Diagnosis not present

## 2021-10-10 DIAGNOSIS — E785 Hyperlipidemia, unspecified: Secondary | ICD-10-CM | POA: Diagnosis not present

## 2021-10-10 DIAGNOSIS — E559 Vitamin D deficiency, unspecified: Secondary | ICD-10-CM | POA: Diagnosis not present

## 2021-10-10 DIAGNOSIS — K5900 Constipation, unspecified: Secondary | ICD-10-CM | POA: Diagnosis not present

## 2021-10-10 DIAGNOSIS — K219 Gastro-esophageal reflux disease without esophagitis: Secondary | ICD-10-CM | POA: Diagnosis not present

## 2021-10-10 DIAGNOSIS — I10 Essential (primary) hypertension: Secondary | ICD-10-CM | POA: Diagnosis not present

## 2021-10-10 DIAGNOSIS — C229 Malignant neoplasm of liver, not specified as primary or secondary: Secondary | ICD-10-CM | POA: Diagnosis not present

## 2021-10-23 NOTE — Progress Notes (Incomplete)
Patient Care Team: Destiny Fanny, DO as PCP - General (Internal Medicine) Destiny Klein, MD as Consulting Physician (General Surgery)  DIAGNOSIS: No diagnosis found.  SUMMARY OF ONCOLOGIC HISTORY: Oncology History  Liver metastasis ([place])  05/03/2015 Imaging   CT abdomen: Bilobed 8.1 cm mass in the liver segment 5. 3.8 cm fluid collection posterior and medial: Favoring adnexal cyst    05/04/2015 Initial Diagnosis   Poorly differentiated carcinoma strongly positive for CK 8/18, CK 7 and MOC-31; HPAR faint cytoplasmic staining; (negative for ER, PR, AFP, TTF-1, WT 1, p63, CD10, CDX 2, CK 20, CK 5/6, mucicarmine)    05/17/2015 PET scan   Hypermetabolic partially necrotic right liver lobe mass , hypermetabolic some within the descending/sigmoid colon junction in the region of inflammation/  diverticulitis versus cancer    06/14/2015 Surgery   liver partial resection segment 7 and 8: Poorly differentiated carcinoma 8.6 cm, margins negative,positive for ck8/18, ck 7, partially positive for ck20, polyclonalCEA, cd10 and negative for HEPAR, AFP, Monoclonal CEA, WT-1, cdx-2, ER, PR, GCDFP and TTF   06/14/2015 Procedure   cancer type ID: 96% pancreaticobiliary origin, 60% cholangiocarcinoma, 36% gallbladder adenocarcinoma   02/09/2016 Surgery   Sigmoid colectomy for recurrent diverticulitis with chronic stricture; no malignancy 4 lymph nodes negative   11/28/2016 Relapse/Recurrence   New right hepatic lobe lesion worrisome for recurrent tumor 4 cm   01/01/2017 - 01/09/2017 Radiation Therapy   SBRT to liver   03/18/2017 Imaging   Hypodense liver lesion smaller (was 3.1 cm, now 2.8 cm), No other new metastatic lesions noted.    09/15/2018 Imaging   CT abdomen: Continued decrease in the size of the posterior right lobe liver mass measuring 1.5 x 1.2 x 0.9 cm, previously it was 1.7 x 1.5 x 1.7 cm      CHIEF COMPLIANT:  Follow-up poorly differentiated carcinoma of the liver  INTERVAL HISTORY: Destiny Velasquez is a 88 y.o. with above-mentioned history of poorly differentiated carcinoma involving the liver who underwent liver resection and is currently on surveillance. CT abdomen/pelvis on 09/13/20 showed no evidence of recurrence or metastatic disease. She presents to the clinic today for follow-up.  She    ALLERGIES:  is allergic to levaquin [levofloxacin].  MEDICATIONS:  Current Outpatient Medications  Medication Sig Dispense Refill   acetaminophen (TYLENOL) 325 MG tablet Take 650 mg by mouth every 6 (six) hours as needed (pain).      aspirin 81 MG tablet Take 81 mg by mouth daily.      bisacodyl (DULCOLAX) 5 MG EC tablet Take 5 mg by mouth once. Took 4 times for bowel prep     Cranberry (ELLURA) 200 MG CAPS Take 200 mg by mouth daily.     feeding supplement (BOOST / RESOURCE BREEZE) LIQD Take 1 Container by mouth 2 (two) times daily between meals.      ibuprofen (ADVIL,MOTRIN) 400 MG tablet Take 400 mg by mouth every 6 (six) hours as needed.     iron polysaccharides (NIFEREX) 150 MG capsule Take 150 mg by mouth daily.     levothyroxine (SYNTHROID, LEVOTHROID) 100 MCG tablet Take 100 mcg by mouth daily before breakfast.     omeprazole (PRILOSEC) 20 MG capsule Take 20 mg by mouth daily.     polyethylene glycol (MIRALAX / GLYCOLAX) packet Take 17 g by mouth daily.     simethicone (MYLICON) 329 MG chewable tablet Chew 125 mg by mouth 3 (three) times daily before meals.     UNABLE TO  FIND Med Name: neomycin     Vitamin D, Ergocalciferol, (DRISDOL) 50000 units CAPS capsule Take 50,000 Units by mouth every 7 (seven) days.     No current facility-administered medications for this visit.    PHYSICAL EXAMINATION: ECOG PERFORMANCE STATUS: {CHL ONC ECOG [phone]}  There were no vitals filed for this visit. There were no vitals filed for this visit.  BREAST:*** No palpable masses or nodules in either right or left breasts. No palpable axillary supraclavicular or infraclavicular adenopathy no  breast tenderness or nipple discharge. (exam performed in the presence of a chaperone)  LABORATORY DATA:  I have reviewed the data as listed CMP Latest Ref Rng & Units 10/05/2021 09/13/2020 09/15/2019  Glucose 70 - 99 mg/dL 100(H) 111(H) 74  BUN 8 - 23 mg/dL 18 24(H) 26(H)  Creatinine 0.44 - 1.00 mg/dL 1.20(H) 1.45(H) 1.48(H)  Sodium 135 - 145 mmol/L 138 140 142  Potassium 3.5 - 5.1 mmol/L 4.1 4.2 4.1  Chloride 98 - 111 mmol/L 104 107 107  CO2 22 - 32 mmol/L 28 28 28   Calcium 8.9 - 10.3 mg/dL 10.2 9.9 10.3  Total Protein 6.5 - 8.1 g/dL 7.3 7.5 7.7  Total Bilirubin 0.3 - 1.2 mg/dL 0.6 0.5 0.3  Alkaline Phos 38 - 126 U/L 96 83 84  AST 15 - 41 U/L 10(L) 13(L) 13(L)  ALT 0 - 44 U/L 8 11 13     Lab Results  Component Value Date   WBC 7.1 10/05/2021   HGB 12.5 10/05/2021   HCT 38.3 10/05/2021   MCV 85.5 10/05/2021   PLT 167 10/05/2021   NEUTROABS 5.3 10/05/2021    ASSESSMENT & PLAN:  No problem-specific Assessment & Plan notes found for this encounter.    No orders of the defined types were placed in this encounter.  The patient has a good understanding of the overall plan. she agrees with it. she will call with any problems that may develop before the next visit here. Total time spent: 30 mins including face to face time and time spent for planning, charting and co-ordination of care   Suzzette Righter, [place] 10/23/21    I, Gardiner Coins, am acting as a scribe for Dr. Lindi Adie

## 2021-10-24 ENCOUNTER — Other Ambulatory Visit: Payer: Self-pay

## 2021-10-24 ENCOUNTER — Inpatient Hospital Stay: Payer: Medicare Other | Attending: Hematology and Oncology | Admitting: Hematology and Oncology

## 2021-10-24 DIAGNOSIS — C801 Malignant (primary) neoplasm, unspecified: Secondary | ICD-10-CM | POA: Insufficient documentation

## 2021-10-24 DIAGNOSIS — C787 Secondary malignant neoplasm of liver and intrahepatic bile duct: Secondary | ICD-10-CM | POA: Diagnosis not present

## 2021-10-24 DIAGNOSIS — Z923 Personal history of irradiation: Secondary | ICD-10-CM | POA: Diagnosis not present

## 2021-10-24 NOTE — Assessment & Plan Note (Signed)
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 ?Sigmoid colectomy 02/09/2016 for recurrent diverticulitis with chronic strictures, 4?lymph nodes negative ?------------------------------------------------------------------------------------------------------------------------------------------------------------- ?? ?CT abdomen 09/15/2018:CT abdomen: Continued decrease in the size of the posterior right lobe liver mass measuring 1.5 x 1.2 x 0.9 cm, previously it was 1.7 x 1.5 x 1.7 cm ?? ?CT abdomen pelvis 09/15/2019: No findings of recurrent malignancy. ?Stable appearance of right hepatic lobe exophytic lesion 1.5 x 1.2 cm, additional benign findings ?? ?CT abdomen pelvis 09/13/2020: Unchanged postoperative changes in the liver.  Thickening of bladder?  Cystitis tiny bilateral kidney stones ? ?CT CAP 10/06/2021: At site of resection new heterogeneously enhancing mass 4.8 cm consistent with recurrent malignancy.  Newly enlarged epicardial lymph node.  Bilateral kidney stones. ?? ?Radiology review: I discussed that the recurrence of the lesion in the liver is concerning for recurrence of her malignancy.  Given her age, we do not recommend liver resection. ?I will refer her to interventional radiology to assess if any other treatment modalities may be available for her. ?

## 2021-10-26 ENCOUNTER — Ambulatory Visit
Admission: RE | Admit: 2021-10-26 | Discharge: 2021-10-26 | Disposition: A | Discharge status: Home / Self Care | Payer: Medicare Other | Source: Ambulatory Visit | Attending: Radiation Oncology | Admitting: Radiation Oncology

## 2021-10-26 ENCOUNTER — Encounter: Payer: Self-pay | Admitting: Radiation Oncology

## 2021-10-26 ENCOUNTER — Other Ambulatory Visit: Payer: Self-pay

## 2021-10-26 VITALS — BP 130/68 | HR 83 | Temp 97.0°F | Resp 18 | Ht 63.0 in | Wt 139.5 lb

## 2021-10-26 DIAGNOSIS — R590 Localized enlarged lymph nodes: Secondary | ICD-10-CM | POA: Insufficient documentation

## 2021-10-26 DIAGNOSIS — Z87891 Personal history of nicotine dependence: Secondary | ICD-10-CM | POA: Insufficient documentation

## 2021-10-26 DIAGNOSIS — I10 Essential (primary) hypertension: Secondary | ICD-10-CM | POA: Diagnosis not present

## 2021-10-26 DIAGNOSIS — C228 Malignant neoplasm of liver, primary, unspecified as to type: Secondary | ICD-10-CM | POA: Diagnosis not present

## 2021-10-26 DIAGNOSIS — E785 Hyperlipidemia, unspecified: Secondary | ICD-10-CM | POA: Diagnosis not present

## 2021-10-26 DIAGNOSIS — C801 Malignant (primary) neoplasm, unspecified: Secondary | ICD-10-CM | POA: Diagnosis not present

## 2021-10-26 DIAGNOSIS — E039 Hypothyroidism, unspecified: Secondary | ICD-10-CM | POA: Diagnosis not present

## 2021-10-26 DIAGNOSIS — I700 Atherosclerosis of aorta: Secondary | ICD-10-CM | POA: Diagnosis not present

## 2021-10-26 DIAGNOSIS — Z8673 Personal history of transient ischemic attack (TIA), and cerebral infarction without residual deficits: Secondary | ICD-10-CM | POA: Insufficient documentation

## 2021-10-26 DIAGNOSIS — I3139 Other pericardial effusion (noninflammatory): Secondary | ICD-10-CM | POA: Diagnosis not present

## 2021-10-26 DIAGNOSIS — Z79899 Other long term (current) drug therapy: Secondary | ICD-10-CM | POA: Insufficient documentation

## 2021-10-26 DIAGNOSIS — C229 Malignant neoplasm of liver, not specified as primary or secondary: Secondary | ICD-10-CM | POA: Diagnosis not present

## 2021-10-26 DIAGNOSIS — N83209 Unspecified ovarian cyst, unspecified side: Secondary | ICD-10-CM | POA: Insufficient documentation

## 2021-10-26 DIAGNOSIS — Z8719 Personal history of other diseases of the digestive system: Secondary | ICD-10-CM | POA: Diagnosis not present

## 2021-10-26 DIAGNOSIS — K5900 Constipation, unspecified: Secondary | ICD-10-CM | POA: Diagnosis not present

## 2021-10-26 DIAGNOSIS — Z8616 Personal history of COVID-19: Secondary | ICD-10-CM | POA: Insufficient documentation

## 2021-10-26 DIAGNOSIS — C787 Secondary malignant neoplasm of liver and intrahepatic bile duct: Secondary | ICD-10-CM | POA: Diagnosis not present

## 2021-10-26 DIAGNOSIS — K219 Gastro-esophageal reflux disease without esophagitis: Secondary | ICD-10-CM | POA: Diagnosis not present

## 2021-10-26 DIAGNOSIS — J90 Pleural effusion, not elsewhere classified: Secondary | ICD-10-CM | POA: Insufficient documentation

## 2021-10-26 NOTE — Progress Notes (Signed)
GI Location of Tumor / Histology:  Recurrent Liver ? ?[name] presented for surveillance scans for Liver metastases ? ?CT CAP 10/06/2021: New heterogeneously enhancing mass 4.8 cm consistent with recurrent malignancy.  Newly enlarged epicardial lymph node.  Bilateral Kidney stones. ? ?CT Abdomen/Pelvis 09/13/2020: Unchanged postoperative appearance of the liver status post partial right hepatectomy and cholecystectomy. No evidence of recurrent or metastatic disease in the abdomen or pelvis. ? ?CT Abdomen/Pelvis 09/15/2019: No findings of recurrent malignancy. Stable appearance of the right hepatic lobe hypodense exophytic lesion along the hepatic capsule. ? ?CT Abdomen/Pelvis 09/15/2018: Continued decrease in the size of the posterior right lobe liver  mass measuring 1.5 x 1.2 x 0.9 cm, previously it was 1.7 x 1.5 x 1.7 cm.: ? ?Biopsies of   ? ?Past/Anticipated interventions by surgeon, if any:  ?Dr. Barry Dienes ?06/14/2015: Partial Liver resection segment 7 and 8. ? ?02/09/2016: Sigmoid colectomy for recurrent diverticulitis with chronic stricture; no malignancy , 4 lymph nodes negative. ? ? ?Past/Anticipated interventions by medical oncology, if any:  ?Dr. Lindi Adie 10/24/2021 ?-I discussed that the recurrence of the lesion in the liver is concerning for recurrence of her malignancy.  ?-I will discuss with radiation oncology Dr. Barry Dienes and interventional radiology regarding her treatment options. ?-I will get back to her in the next 1 to 2 days with the consensus decision. ? ? ?Weight changes, if any: Fluctuating a couple pounds. ? ?Bowel/Bladder complaints, if any: Normal bowels and no bladder changes. ? ?Nausea / Vomiting, if any: No ? ?Pain issues, if any:  No ? ?Appetite: Decreased over the past couple of years. ? ?SAFETY ISSUES: ?Prior radiation? SBRT Liver 5/22-5/30/2018 with Dr. Lisbeth Renshaw ?Pacemaker/ICD? No ?Possible current pregnancy? No ?Is the patient on methotrexate? No ? ?Current Complaints/Details: ? ?

## 2021-10-26 NOTE — Progress Notes (Signed)
?Radiation Oncology         [phone] ?________________________________ ? ?Name: Destiny Velasquez MRN: [id number]  ?Date: 10/26/2021  DOB: 07-May-1926 ? ?CC:Lim, Barbera Setters, DO  Nicholas Lose, MD    ? ?REFERRING PHYSICIAN: Nicholas Lose, MD ? ? ?DIAGNOSIS: The encounter diagnosis was Liver metastasis ([place]). ? ? ?HISTORY OF PRESENT ILLNESS: Destiny Velasquez is a 88 y.o. female with a history of poorly differentiated carcinoma diagnosed in 2016 in the liver.  She underwent a partial liver resection of segment 7 and 8 on 06/14/15 showed poorly differentiated carcinoma, 8.6 cm, with negative margins. She underwent a sigmoid colectomy for recurrent diverticulitis with chronic stricture on 02/09/16. There was no evidence of malignancy and 4 lymph nodes were negative.  She was being followed in surveillance until repeat staging scan on 11/28/16 revealed a new right hepatic lesion measuring 4 x 3 x 2.4 cm, worrisome for malignancy. MRI of the liver on 12/10/16 showed recurrent tumor posteriorly in the liver near the resection site with new masses centrally necrotic with a 21 cubic cm volume.  She subsequently was treated with stereotactic body radiotherapy in May 2018.  She did have a good response to this therapy and steady improvement in the size of this was seen over several years time, and stability was noted in 2021 as well as 2022.  A yearly restaging scan on 10/05/2021 did show progressive enhancement or mass involving the adjacent liver and right hemidiaphragm measuring 4.8 cm.  She also had a newly enlarged right epicardial lymph node measuring 2.2 cm.  Of note she had recovered from COVID-19 infection recently and it was felt that even though the radiologist was concerned by the lymph node, this could potentially be post COVID change.  She is seen to discuss additional radiotherapy options as surgery is not recommended nor would interventional radiologic procedures be recommended. ? ?PREVIOUS RADIATION THERAPY:  ? ?01/01/17 -  01/09/17 SBRT Treatment: ?Liver lesion was treated to 54 Gy in 3 fractions ? ? ?PAST MEDICAL HISTORY:  ?Past Medical History:  ?Diagnosis Date  ? Anal and rectal polyp   ? Breast cancer, right breast ([place]) 1971  ? "never had chemo or radiation"  ? Depression   ? Disorder of bone and cartilage, unspecified   ? Diverticulitis of large intestine without perforation or abscess without bleeding   ? Diverticulosis of colon (without mention of hemorrhage)   ? Essential and other specified forms of tremor   ? Family history of adverse reaction to anesthesia   ? daughter and grandson are slow to wake up  ? Frequent UTI   ? GERD (gastroesophageal reflux disease)   ? Hyperlipidemia   ? Hypertension   ? Liver cancer ([place]) 2016  ? Malignant neoplasm of breast (female), unspecified site   ? Pneumonia X 1  ? Stroke [facility])   ? after liver surgery - in 2016 no lasting weakness. does walk with walker  ? Subacute delirium   ? Thyroid disease   ? Unspecified constipation   ? Unspecified hypothyroidism   ?   ? ? ?PAST SURGICAL HISTORY: ?Past Surgical History:  ?Procedure Laterality Date  ? ABDOMINAL HYSTERECTOMY    ? APPENDECTOMY    ? BREAST BIOPSY Right 1971  ? CATARACT EXTRACTION W/ INTRAOCULAR LENS  IMPLANT, BILATERAL Bilateral   ? CHOLECYSTECTOMY    ? CYSTOSCOPY W/ URETERAL STENT PLACEMENT Bilateral 02/09/2016  ? Procedure: CYSTOSCOPY WITH RETROGRADE PYELOGRAM/ BILATERAL TEMPORARY INDWELLING URETERAL STENT PLACEMENT;  Surgeon: Ardis Hughs, MD;  Location: [facility] OR;  Service: Urology;  Laterality: Bilateral;  ? LAPAROSCOPY N/A 06/14/2015  ? Procedure: LAPAROSCOPY DIAGNOSTIC;  Surgeon: [name], MD;  Location: [facility] ORS;  Service: General;  Laterality: N/A;  ? LIVER BIOPSY  06/14/2015  ? Procedure: LIVER BIOPSY;  Surgeon: [name], MD;  Location: [facility] ORS;  Service: General;;  ? MASTECTOMY Right 1971  ? OPEN PARTIAL HEPATECTOMY  N/A 06/14/2015  ? Procedure:  OPEN PARTIAL RIGHT HEPATECTOMY;  Surgeon: [name], MD;  Location: [facility]  ORS;  Service: General;  Laterality: N/A;  converted to open @ 1447  ? PARTIAL COLECTOMY N/A 02/09/2016  ? Procedure: OPEN SIGMOID  COLECTOMY WITH REPAIR COLOVESICAL AND COLOVAGINAL FISTULA;  Surgeon: [name], MD;  Location: [place];  Service: General;  Laterality: N/A;  ? RECTAL POLYPECTOMY  early 2000's  ? TONSILLECTOMY AND ADENOIDECTOMY  1935  ? TUBAL LIGATION    ? ? ? ?FAMILY HISTORY:  ?Family History  ?Problem Relation Age of Onset  ? Cancer Mother   ? Hypertension Father   ? ? ? ?SOCIAL HISTORY:  reports that she quit smoking about 9 years ago. Her smoking use included cigarettes. She has a 20.00 pack-year smoking history. She has never used smokeless tobacco. She reports current alcohol use. She reports that she does not use drugs. The patient is widowed and lives in a retirement community. She is originally from [place]. She is active and exercises multiple times per week. ? ? ?ALLERGIES: Levaquin [levofloxacin] ? ? ?MEDICATIONS:  ?Current Outpatient Medications  ?Medication Sig Dispense Refill  ? acetaminophen (TYLENOL) 325 MG tablet Take 650 mg by mouth every 6 (six) hours as needed (pain).     ? amLODipine (NORVASC) 5 MG tablet Take by mouth.    ? bisacodyl (DULCOLAX) 5 MG EC tablet Take 5 mg by mouth once. Took 4 times for bowel prep    ? Cranberry 200 MG CAPS Take 200 mg by mouth daily.    ? guaiFENesin (MUCINEX) 600 MG 12 hr tablet Take by mouth.    ? hydrocortisone 1 % ointment Apply topically.    ? ibuprofen (ADVIL,MOTRIN) 400 MG tablet Take 400 mg by mouth every 6 (six) hours as needed.    ? levothyroxine (SYNTHROID, LEVOTHROID) 100 MCG tablet Take 100 mcg by mouth daily before breakfast.    ? loratadine (CLARITIN) 10 MG tablet Take by mouth.    ? nitrofurantoin (MACRODANTIN) 100 MG capsule Take by mouth.    ? omeprazole (PRILOSEC) 20 MG capsule Take 20 mg by mouth daily.    ? polyethylene glycol (MIRALAX / GLYCOLAX) packet Take 17 g by mouth daily.    ? simethicone (MYLICON) 427 MG chewable tablet  Chew 125 mg by mouth 3 (three) times daily before meals.    ? Sod Fluoride-Potassium Nitrate (PREVIDENT 5000 ENAMEL PROTECT) 1.1-5 % GEL     ? Vitamin D, Ergocalciferol, (DRISDOL) 50000 units CAPS capsule Take 50,000 Units by mouth every 7 (seven) days.    ? ?No current facility-administered medications for this encounter.  ? ? ? ?REVIEW OF SYSTEMS: On review of systems, the patient reports that she is doing well overall. She is not having any abdominal pain, difficulty with eating, bowel activity or changes of her skin or any icterus. No other complaints are verbalized.  ? ?PHYSICAL EXAM:  ?Wt Readings from Last 3 Encounters:  ?10/26/21 139 lb 8 oz (63.3 kg)  ?10/24/21 142 lb 3.2 oz (64.5 kg)  ?09/30/20 154 lb 3.2 oz (69.9  kg)  ? ?Temp Readings from Last 3 Encounters:  ?10/26/21 (!) 97 ?F (36.1 ?C) (Temporal)  ?10/24/21 (!) 97.5 ?F (36.4 ?C) (Temporal)  ?09/30/20 97.9 ?F (36.6 ?C) (Tympanic)  ? ?BP Readings from Last 3 Encounters:  ?10/26/21 130/68  ?10/24/21 (!) 151/64  ?09/30/20 (!) 125/58  ? ?Pulse Readings from Last 3 Encounters:  ?10/26/21 83  ?10/24/21 81  ?09/30/20 69  ? ?Pain Assessment ?Pain Score: 0-No pain/10 ? ?In general this is a well appearing caucasian female who appears younger than her stated age in no acute distress. She's alert and oriented x4 and appropriate throughout the examination. Cardiopulmonary assessment is negative for acute distress and she exhibits normal effort.  ? ? ? ?ECOG = 0 ? ?0 - Asymptomatic (Fully active, able to carry on all predisease activities without restriction) ? ?1 - Symptomatic but completely ambulatory (Restricted in physically strenuous activity but ambulatory and able to carry out work of a light or sedentary nature. For example, light housework, office work) ? ?2 - Symptomatic, <50% in bed during the day (Ambulatory and capable of all self care but unable to carry out any work activities. Up and about more than 50% of waking hours) ? ?3 - Symptomatic, >50% in  bed, but not bedbound (Capable of only limited self-care, confined to bed or chair 50% or more of waking hours) ? ?4 - Bedbound (Completely disabled. Cannot carry on any self-care. Totally confined to bed or c

## 2021-10-27 ENCOUNTER — Ambulatory Visit
Admission: RE | Admit: 2021-10-27 | Discharge: 2021-10-27 | Disposition: A | Discharge status: Home / Self Care | Payer: Medicare Other | Source: Ambulatory Visit | Attending: Radiation Oncology | Admitting: Radiation Oncology

## 2021-10-27 ENCOUNTER — Ambulatory Visit: Admission: RE | Admit: 2021-10-27 | Payer: Medicare Other | Source: Ambulatory Visit | Admitting: Radiation Oncology

## 2021-10-27 VITALS — BP 126/63 | HR 79 | Temp 97.6°F | Resp 20 | Ht 63.0 in | Wt 143.2 lb

## 2021-10-27 DIAGNOSIS — C787 Secondary malignant neoplasm of liver and intrahepatic bile duct: Secondary | ICD-10-CM | POA: Insufficient documentation

## 2021-10-27 DIAGNOSIS — Z510 Encounter for antineoplastic radiation therapy: Secondary | ICD-10-CM | POA: Diagnosis not present

## 2021-10-27 DIAGNOSIS — C801 Malignant (primary) neoplasm, unspecified: Secondary | ICD-10-CM | POA: Diagnosis not present

## 2021-10-27 DIAGNOSIS — C228 Malignant neoplasm of liver, primary, unspecified as to type: Secondary | ICD-10-CM | POA: Diagnosis not present

## 2021-10-27 MED ORDER — SODIUM CHLORIDE 0.9% FLUSH
10.0000 mL | Freq: Once | INTRAVENOUS | Status: AC
  Administered 2021-10-27: 10 mL via INTRAVENOUS

## 2021-10-27 NOTE — Progress Notes (Signed)
Has armband been applied?  Yes ? ?Does patient have an allergy to IV contrast dye?: No ?  ?Has patient ever received premedication for IV contrast dye?:  n/a ? ?Does patient take metformin?: No ? ?If patient does take metformin when was the last dose: n/a ? ?Date of lab work: 10/05/2021 ?BUN: 18 ?CR: 1.20 ?eGfr: 42  ? ?IV site: Left AC ? ?Has IV site been added to flowsheet?  Yes ? ?BP 126/63 (BP Location: Right Arm, Patient Position: Sitting, Cuff Size: Normal)   Pulse 79   Temp 97.6 ?F (36.4 ?C)   Resp 20   Ht 5' 3"  (1.6 m)   Wt 143 lb 3.2 oz (65 kg)   SpO2 99%   BMI 25.37 kg/m?   ? ? ?

## 2021-11-06 DIAGNOSIS — E039 Hypothyroidism, unspecified: Secondary | ICD-10-CM | POA: Diagnosis not present

## 2021-11-06 DIAGNOSIS — K5900 Constipation, unspecified: Secondary | ICD-10-CM | POA: Diagnosis not present

## 2021-11-06 DIAGNOSIS — L309 Dermatitis, unspecified: Secondary | ICD-10-CM | POA: Diagnosis not present

## 2021-11-06 DIAGNOSIS — Z79899 Other long term (current) drug therapy: Secondary | ICD-10-CM | POA: Diagnosis not present

## 2021-11-06 DIAGNOSIS — N1832 Chronic kidney disease, stage 3b: Secondary | ICD-10-CM | POA: Diagnosis not present

## 2021-11-06 DIAGNOSIS — E559 Vitamin D deficiency, unspecified: Secondary | ICD-10-CM | POA: Diagnosis not present

## 2021-11-06 DIAGNOSIS — I10 Essential (primary) hypertension: Secondary | ICD-10-CM | POA: Diagnosis not present

## 2021-11-06 DIAGNOSIS — I129 Hypertensive chronic kidney disease with stage 1 through stage 4 chronic kidney disease, or unspecified chronic kidney disease: Secondary | ICD-10-CM | POA: Diagnosis not present

## 2021-11-06 DIAGNOSIS — C787 Secondary malignant neoplasm of liver and intrahepatic bile duct: Secondary | ICD-10-CM | POA: Diagnosis not present

## 2021-11-06 DIAGNOSIS — Z87891 Personal history of nicotine dependence: Secondary | ICD-10-CM | POA: Diagnosis not present

## 2021-11-06 DIAGNOSIS — C229 Malignant neoplasm of liver, not specified as primary or secondary: Secondary | ICD-10-CM | POA: Diagnosis not present

## 2021-11-06 DIAGNOSIS — Z0001 Encounter for general adult medical examination with abnormal findings: Secondary | ICD-10-CM | POA: Diagnosis not present

## 2021-11-07 DIAGNOSIS — Z510 Encounter for antineoplastic radiation therapy: Secondary | ICD-10-CM | POA: Diagnosis not present

## 2021-11-07 DIAGNOSIS — C787 Secondary malignant neoplasm of liver and intrahepatic bile duct: Secondary | ICD-10-CM | POA: Diagnosis not present

## 2021-11-07 DIAGNOSIS — C801 Malignant (primary) neoplasm, unspecified: Secondary | ICD-10-CM | POA: Diagnosis not present

## 2021-11-07 DIAGNOSIS — C228 Malignant neoplasm of liver, primary, unspecified as to type: Secondary | ICD-10-CM | POA: Diagnosis not present

## 2021-11-08 ENCOUNTER — Other Ambulatory Visit: Payer: Self-pay

## 2021-11-08 ENCOUNTER — Ambulatory Visit
Admission: RE | Admit: 2021-11-08 | Discharge: 2021-11-08 | Disposition: A | Discharge status: Home / Self Care | Payer: Medicare Other | Source: Ambulatory Visit | Attending: Radiation Oncology | Admitting: Radiation Oncology

## 2021-11-08 DIAGNOSIS — C801 Malignant (primary) neoplasm, unspecified: Secondary | ICD-10-CM | POA: Diagnosis not present

## 2021-11-08 DIAGNOSIS — C787 Secondary malignant neoplasm of liver and intrahepatic bile duct: Secondary | ICD-10-CM | POA: Diagnosis not present

## 2021-11-08 DIAGNOSIS — C228 Malignant neoplasm of liver, primary, unspecified as to type: Secondary | ICD-10-CM | POA: Diagnosis not present

## 2021-11-08 DIAGNOSIS — Z510 Encounter for antineoplastic radiation therapy: Secondary | ICD-10-CM | POA: Diagnosis not present

## 2021-11-09 ENCOUNTER — Ambulatory Visit
Admission: RE | Admit: 2021-11-09 | Discharge: 2021-11-09 | Disposition: A | Discharge status: Home / Self Care | Payer: Medicare Other | Source: Ambulatory Visit | Attending: Radiation Oncology | Admitting: Radiation Oncology

## 2021-11-09 DIAGNOSIS — C801 Malignant (primary) neoplasm, unspecified: Secondary | ICD-10-CM | POA: Diagnosis not present

## 2021-11-09 DIAGNOSIS — C787 Secondary malignant neoplasm of liver and intrahepatic bile duct: Secondary | ICD-10-CM | POA: Diagnosis not present

## 2021-11-09 DIAGNOSIS — Z510 Encounter for antineoplastic radiation therapy: Secondary | ICD-10-CM | POA: Diagnosis not present

## 2021-11-09 DIAGNOSIS — C228 Malignant neoplasm of liver, primary, unspecified as to type: Secondary | ICD-10-CM | POA: Diagnosis not present

## 2021-11-10 ENCOUNTER — Ambulatory Visit
Admission: RE | Admit: 2021-11-10 | Discharge: 2021-11-10 | Disposition: A | Discharge status: Home / Self Care | Payer: Medicare Other | Source: Ambulatory Visit | Attending: Radiation Oncology | Admitting: Radiation Oncology

## 2021-11-10 ENCOUNTER — Other Ambulatory Visit: Payer: Self-pay

## 2021-11-10 DIAGNOSIS — C801 Malignant (primary) neoplasm, unspecified: Secondary | ICD-10-CM | POA: Diagnosis not present

## 2021-11-10 DIAGNOSIS — C787 Secondary malignant neoplasm of liver and intrahepatic bile duct: Secondary | ICD-10-CM | POA: Diagnosis not present

## 2021-11-10 DIAGNOSIS — Z510 Encounter for antineoplastic radiation therapy: Secondary | ICD-10-CM | POA: Diagnosis not present

## 2021-11-10 DIAGNOSIS — C228 Malignant neoplasm of liver, primary, unspecified as to type: Secondary | ICD-10-CM | POA: Diagnosis not present

## 2021-11-13 ENCOUNTER — Other Ambulatory Visit: Payer: Self-pay

## 2021-11-13 ENCOUNTER — Other Ambulatory Visit: Payer: Self-pay | Admitting: Radiation Oncology

## 2021-11-13 ENCOUNTER — Ambulatory Visit
Admission: RE | Admit: 2021-11-13 | Discharge: 2021-11-13 | Disposition: A | Discharge status: Home / Self Care | Payer: Medicare Other | Source: Ambulatory Visit | Attending: Radiation Oncology | Admitting: Radiation Oncology

## 2021-11-13 DIAGNOSIS — C787 Secondary malignant neoplasm of liver and intrahepatic bile duct: Secondary | ICD-10-CM | POA: Insufficient documentation

## 2021-11-13 DIAGNOSIS — C801 Malignant (primary) neoplasm, unspecified: Secondary | ICD-10-CM | POA: Diagnosis not present

## 2021-11-13 DIAGNOSIS — Z510 Encounter for antineoplastic radiation therapy: Secondary | ICD-10-CM | POA: Insufficient documentation

## 2021-11-13 DIAGNOSIS — C228 Malignant neoplasm of liver, primary, unspecified as to type: Secondary | ICD-10-CM | POA: Diagnosis not present

## 2021-11-13 MED ORDER — ONDANSETRON HCL 8 MG PO TABS: 8.0000 mg | ORAL_TABLET | Freq: Three times a day (TID) | ORAL | 1 refills | Status: DC | PRN

## 2021-11-13 NOTE — Progress Notes (Signed)
The patient is currently receiving ultra hypofractionated radiotherapy to her liver due to recurrent adenocarcinoma of the liver versus intrahepatic cholangiocarcinoma.  She is seen after 4 of the 10 planned treatments due to regurgitation and lack of oral intake.  She does not have any recent labs. ? ?During her evaluation, she describes a history of more than 18 months in duration of intermittent flares of swallowing function.  She describes a lot of mucus and regurgitation of this at certain times, her daughter states that this is also typically prompted by a heavy meal.  She was started on omeprazole daily but has gradually increase the to this to twice a day.  While she does not describe odynophagia she describes eating well but then when it does regurgitate up lots of saliva accompanied by undigested food.  Her most recent flare was yesterday evening.  She is also recently increased her vitamin D supplement which her daughter is helping her with with the help impacting her cancer.  She reports normal bowel movements, light yellow-colored urine and no chest pain at rest but when she is struggling to regurgitate sometimes she does experiencing some discomfort.  She denies abdominal pain or pain with swallowing.  No other complaints are verbalized. ? ?Her vital signs were reviewed.  Her blood pressure was 134/76, pulse was normal in to 70 range, respiratory rate was 20 breaths/min, pulse ox was 98% on room air.  In general this is a well-appearing well-developed and nourished Caucasian elderly female in no acute distress she is alert and oriented x4 and appropriate throughout the examination.  No evidence of acute cardiopulmonary difficulties are noted. ? ?We discussed trying Zofran if she feels like she is starting to get queasy, which may help lower down in the stomach but  I think that she has more of a dysmotility issue of her esophagus.  Dr. [name] has retired but she has been seen in [facility].  I  encouraged the patient's daughter to contact their office rather than trying to make any additional medication adjustments.  We will otherwise plan to continue our course of radiotherapy as outlined. ? ? ? ? ?Carola Rhine, PAC  ? ?

## 2021-11-14 ENCOUNTER — Ambulatory Visit
Admission: RE | Admit: 2021-11-14 | Discharge: 2021-11-14 | Disposition: A | Discharge status: Home / Self Care | Payer: Medicare Other | Source: Ambulatory Visit | Attending: Radiation Oncology | Admitting: Radiation Oncology

## 2021-11-14 DIAGNOSIS — C801 Malignant (primary) neoplasm, unspecified: Secondary | ICD-10-CM | POA: Diagnosis not present

## 2021-11-14 DIAGNOSIS — C228 Malignant neoplasm of liver, primary, unspecified as to type: Secondary | ICD-10-CM | POA: Diagnosis not present

## 2021-11-14 DIAGNOSIS — Z510 Encounter for antineoplastic radiation therapy: Secondary | ICD-10-CM | POA: Diagnosis not present

## 2021-11-14 DIAGNOSIS — C787 Secondary malignant neoplasm of liver and intrahepatic bile duct: Secondary | ICD-10-CM | POA: Diagnosis not present

## 2021-11-15 ENCOUNTER — Other Ambulatory Visit: Payer: Self-pay

## 2021-11-15 ENCOUNTER — Ambulatory Visit
Admission: RE | Admit: 2021-11-15 | Discharge: 2021-11-15 | Disposition: A | Discharge status: Home / Self Care | Payer: Medicare Other | Source: Ambulatory Visit | Attending: Radiation Oncology | Admitting: Radiation Oncology

## 2021-11-15 DIAGNOSIS — C801 Malignant (primary) neoplasm, unspecified: Secondary | ICD-10-CM | POA: Diagnosis not present

## 2021-11-15 DIAGNOSIS — C787 Secondary malignant neoplasm of liver and intrahepatic bile duct: Secondary | ICD-10-CM | POA: Diagnosis not present

## 2021-11-15 DIAGNOSIS — Z510 Encounter for antineoplastic radiation therapy: Secondary | ICD-10-CM | POA: Diagnosis not present

## 2021-11-15 DIAGNOSIS — C228 Malignant neoplasm of liver, primary, unspecified as to type: Secondary | ICD-10-CM | POA: Diagnosis not present

## 2021-11-16 ENCOUNTER — Ambulatory Visit
Admission: RE | Admit: 2021-11-16 | Discharge: 2021-11-16 | Disposition: A | Discharge status: Home / Self Care | Payer: Medicare Other | Source: Ambulatory Visit | Attending: Radiation Oncology | Admitting: Radiation Oncology

## 2021-11-16 DIAGNOSIS — C801 Malignant (primary) neoplasm, unspecified: Secondary | ICD-10-CM | POA: Diagnosis not present

## 2021-11-16 DIAGNOSIS — Z510 Encounter for antineoplastic radiation therapy: Secondary | ICD-10-CM | POA: Diagnosis not present

## 2021-11-16 DIAGNOSIS — C228 Malignant neoplasm of liver, primary, unspecified as to type: Secondary | ICD-10-CM | POA: Diagnosis not present

## 2021-11-16 DIAGNOSIS — C787 Secondary malignant neoplasm of liver and intrahepatic bile duct: Secondary | ICD-10-CM | POA: Diagnosis not present

## 2021-11-17 ENCOUNTER — Ambulatory Visit
Admission: RE | Admit: 2021-11-17 | Discharge: 2021-11-17 | Disposition: A | Discharge status: Home / Self Care | Payer: Medicare Other | Source: Ambulatory Visit | Attending: Radiation Oncology | Admitting: Radiation Oncology

## 2021-11-17 ENCOUNTER — Other Ambulatory Visit: Payer: Self-pay

## 2021-11-17 DIAGNOSIS — Z510 Encounter for antineoplastic radiation therapy: Secondary | ICD-10-CM | POA: Diagnosis not present

## 2021-11-17 DIAGNOSIS — C801 Malignant (primary) neoplasm, unspecified: Secondary | ICD-10-CM | POA: Diagnosis not present

## 2021-11-17 DIAGNOSIS — C787 Secondary malignant neoplasm of liver and intrahepatic bile duct: Secondary | ICD-10-CM | POA: Diagnosis not present

## 2021-11-17 DIAGNOSIS — C228 Malignant neoplasm of liver, primary, unspecified as to type: Secondary | ICD-10-CM | POA: Diagnosis not present

## 2021-11-20 ENCOUNTER — Other Ambulatory Visit: Payer: Self-pay

## 2021-11-20 ENCOUNTER — Ambulatory Visit
Admission: RE | Admit: 2021-11-20 | Discharge: 2021-11-20 | Disposition: A | Discharge status: Home / Self Care | Payer: Medicare Other | Source: Ambulatory Visit | Attending: Radiation Oncology | Admitting: Radiation Oncology

## 2021-11-20 DIAGNOSIS — Z510 Encounter for antineoplastic radiation therapy: Secondary | ICD-10-CM | POA: Diagnosis not present

## 2021-11-20 DIAGNOSIS — C787 Secondary malignant neoplasm of liver and intrahepatic bile duct: Secondary | ICD-10-CM | POA: Diagnosis not present

## 2021-11-20 DIAGNOSIS — C801 Malignant (primary) neoplasm, unspecified: Secondary | ICD-10-CM | POA: Diagnosis not present

## 2021-11-20 DIAGNOSIS — C228 Malignant neoplasm of liver, primary, unspecified as to type: Secondary | ICD-10-CM | POA: Diagnosis not present

## 2021-11-21 ENCOUNTER — Ambulatory Visit
Admission: RE | Admit: 2021-11-21 | Discharge: 2021-11-21 | Disposition: A | Discharge status: Home / Self Care | Payer: Medicare Other | Source: Ambulatory Visit | Attending: Radiation Oncology | Admitting: Radiation Oncology

## 2021-11-21 ENCOUNTER — Encounter: Payer: Self-pay | Admitting: Radiation Oncology

## 2021-11-21 DIAGNOSIS — C787 Secondary malignant neoplasm of liver and intrahepatic bile duct: Secondary | ICD-10-CM | POA: Diagnosis not present

## 2021-11-21 DIAGNOSIS — Z510 Encounter for antineoplastic radiation therapy: Secondary | ICD-10-CM | POA: Diagnosis not present

## 2021-11-21 DIAGNOSIS — C801 Malignant (primary) neoplasm, unspecified: Secondary | ICD-10-CM | POA: Diagnosis not present

## 2021-11-21 DIAGNOSIS — C228 Malignant neoplasm of liver, primary, unspecified as to type: Secondary | ICD-10-CM | POA: Diagnosis not present

## 2021-11-28 ENCOUNTER — Telehealth: Payer: Self-pay | Admitting: Hematology and Oncology

## 2021-11-28 NOTE — Telephone Encounter (Signed)
Scheduled appointment per Destiny Velasquez in radiation request. Patient needs post radiation appointment. Patient aware of appointment.  ?

## 2021-11-30 ENCOUNTER — Other Ambulatory Visit: Payer: Self-pay | Admitting: Physician Assistant

## 2021-11-30 DIAGNOSIS — K219 Gastro-esophageal reflux disease without esophagitis: Secondary | ICD-10-CM | POA: Diagnosis not present

## 2021-11-30 DIAGNOSIS — C229 Malignant neoplasm of liver, not specified as primary or secondary: Secondary | ICD-10-CM | POA: Diagnosis not present

## 2021-11-30 DIAGNOSIS — R1310 Dysphagia, unspecified: Secondary | ICD-10-CM

## 2021-11-30 DIAGNOSIS — K5900 Constipation, unspecified: Secondary | ICD-10-CM | POA: Diagnosis not present

## 2021-12-04 DIAGNOSIS — Z7982 Long term (current) use of aspirin: Secondary | ICD-10-CM | POA: Diagnosis not present

## 2021-12-04 DIAGNOSIS — I129 Hypertensive chronic kidney disease with stage 1 through stage 4 chronic kidney disease, or unspecified chronic kidney disease: Secondary | ICD-10-CM | POA: Diagnosis not present

## 2021-12-04 DIAGNOSIS — R1310 Dysphagia, unspecified: Secondary | ICD-10-CM | POA: Diagnosis not present

## 2021-12-04 DIAGNOSIS — E559 Vitamin D deficiency, unspecified: Secondary | ICD-10-CM | POA: Diagnosis not present

## 2021-12-04 DIAGNOSIS — N189 Chronic kidney disease, unspecified: Secondary | ICD-10-CM | POA: Diagnosis not present

## 2021-12-04 DIAGNOSIS — E039 Hypothyroidism, unspecified: Secondary | ICD-10-CM | POA: Diagnosis not present

## 2021-12-04 DIAGNOSIS — C229 Malignant neoplasm of liver, not specified as primary or secondary: Secondary | ICD-10-CM | POA: Diagnosis not present

## 2021-12-05 ENCOUNTER — Inpatient Hospital Stay: Payer: Medicare Other | Attending: Hematology and Oncology | Admitting: Hematology and Oncology

## 2021-12-05 ENCOUNTER — Other Ambulatory Visit: Payer: Self-pay

## 2021-12-05 DIAGNOSIS — C787 Secondary malignant neoplasm of liver and intrahepatic bile duct: Secondary | ICD-10-CM | POA: Diagnosis not present

## 2021-12-05 DIAGNOSIS — C801 Malignant (primary) neoplasm, unspecified: Secondary | ICD-10-CM | POA: Diagnosis not present

## 2021-12-05 NOTE — Progress Notes (Signed)
? ?Patient Care Team: ?Wynelle Fanny, DO as PCP - General (Internal Medicine) ?[name], MD as Consulting Physician (General Surgery) ? ?DIAGNOSIS:  ?Encounter Diagnosis  ?Name Primary?  ? Malignant neoplasm metastatic to liver [facility])   ? ? ?SUMMARY OF ONCOLOGIC HISTORY: ?Oncology History  ?Malignant neoplasm metastatic to liver [facility])  ?05/03/2015 Imaging  ? CT abdomen: Bilobed 8.1 cm mass in the liver segment 5. 3.8 cm fluid collection posterior and medial: Favoring adnexal cyst  ? ?  ?05/04/2015 Initial Diagnosis  ? Poorly differentiated carcinoma strongly positive for CK 8/18, CK 7 and MOC-31; HPAR faint cytoplasmic staining; (negative for ER, PR, AFP, TTF-1, WT 1, p63, CD10, CDX 2, CK 20, CK 5/6, mucicarmine)  ? ?  ?05/17/2015 PET scan  ? Hypermetabolic partially necrotic right liver lobe mass , hypermetabolic some within the descending/sigmoid colon junction in the region of inflammation/  diverticulitis versus cancer  ? ?  ?06/14/2015 Surgery  ? liver partial resection segment 7 and 8: Poorly differentiated carcinoma 8.6 cm, margins negative,positive for ck8/18, ck 7, partially positive for ck20, polyclonalCEA, cd10 and negative for HEPAR, AFP, Monoclonal CEA, WT-1, cdx-2, ER, PR, GCDFP and TTF ? ?  ?06/14/2015 Procedure  ? cancer type ID: 96% pancreaticobiliary origin, 60% cholangiocarcinoma, 36% gallbladder adenocarcinoma ? ?  ?02/09/2016 Surgery  ? Sigmoid colectomy for recurrent diverticulitis with chronic stricture; no malignancy 4 lymph nodes negative ? ?  ?11/28/2016 Relapse/Recurrence  ? New right hepatic lobe lesion worrisome for recurrent tumor 4 cm ? ?  ?01/01/2017 - 01/09/2017 Radiation Therapy  ? SBRT to liver ? ?  ?03/18/2017 Imaging  ? Hypodense liver lesion smaller (was 3.1 cm, now 2.8 cm), No other new metastatic lesions noted. ? ? ?  ?09/15/2018 Imaging  ? CT abdomen: Continued decrease in the size of the posterior right lobe liver mass measuring 1.5 x 1.2 x 0.9 cm, previously it was 1.7 x 1.5 x 1.7  cm ? ? ?  ? ? ?CHIEF COMPLIANT: Follow-up poorly differentiated carcinoma of the liver ? ?INTERVAL HISTORY: Destiny Velasquez is a 88 y.o. with above-mentioned history of poorly differentiated carcinoma involving the liver who underwent liver resection and is currently on surveillance. She presents to the clinic today for a follow-up. She states that she did good after radiation. She states it has been several days since she had a bowel movement. She is concern about when she takes numerous of pills at one time she get some belching.  ? ? ?ALLERGIES:  is allergic to levaquin [levofloxacin]. ? ?MEDICATIONS:  ?Current Outpatient Medications  ?Medication Sig Dispense Refill  ? acetaminophen (TYLENOL) 325 MG tablet Take 650 mg by mouth every 6 (six) hours as needed (pain).     ? amLODipine (NORVASC) 5 MG tablet Take by mouth.    ? bisacodyl (DULCOLAX) 5 MG EC tablet Take 5 mg by mouth once. Took 4 times for bowel prep    ? Cranberry 200 MG CAPS Take 200 mg by mouth daily.    ? guaiFENesin (MUCINEX) 600 MG 12 hr tablet Take by mouth.    ? hydrocortisone 1 % ointment Apply topically.    ? ibuprofen (ADVIL,MOTRIN) 400 MG tablet Take 400 mg by mouth every 6 (six) hours as needed.    ? loratadine (CLARITIN) 10 MG tablet Take by mouth.    ? nitrofurantoin (MACRODANTIN) 100 MG capsule Take by mouth.    ? omeprazole (PRILOSEC) 20 MG capsule Take 20 mg by mouth daily.    ? ondansetron (  ZOFRAN) 8 MG tablet Take 1 tablet (8 mg total) by mouth every 8 (eight) hours as needed for nausea or vomiting. 30 tablet 1  ? polyethylene glycol (MIRALAX / GLYCOLAX) packet Take 17 g by mouth daily.    ? simethicone (MYLICON) 333 MG chewable tablet Chew 125 mg by mouth 3 (three) times daily before meals.    ? Sod Fluoride-Potassium Nitrate (PREVIDENT 5000 ENAMEL PROTECT) 1.1-5 % GEL     ? Vitamin D, Ergocalciferol, (DRISDOL) 50000 units CAPS capsule Take 50,000 Units by mouth every 7 (seven) days.    ? ?No current facility-administered medications for  this visit.  ? ? ?PHYSICAL EXAMINATION: ?ECOG PERFORMANCE STATUS: 1 - Symptomatic but completely ambulatory ? ?Vitals:  ? 12/05/21 1024  ?BP: 138/88  ?Pulse: 99  ?Resp: 18  ?Temp: (!) 97.2 ?F (36.2 ?C)  ?SpO2: 99%  ? ?Filed Weights  ? 12/05/21 1024  ?Weight: 136 lb (61.7 kg)  ? ?  ? ?LABORATORY DATA:  ?I have reviewed the data as listed ? ?  Latest Ref Rng & Units 10/05/2021  ?  7:35 AM 09/13/2020  ?  9:26 AM 09/15/2019  ?  9:15 AM  ?CMP  ?Glucose 70 - 99 mg/dL 100   111   74    ?BUN 8 - 23 mg/dL 18   24   26     ?Creatinine 0.44 - 1.00 mg/dL 1.20   1.45   1.48    ?Sodium 135 - 145 mmol/L 138   140   142    ?Potassium 3.5 - 5.1 mmol/L 4.1   4.2   4.1    ?Chloride 98 - 111 mmol/L 104   107   107    ?CO2 22 - 32 mmol/L 28   28   28     ?Calcium 8.9 - 10.3 mg/dL 10.2   9.9   10.3    ?Total Protein 6.5 - 8.1 g/dL 7.3   7.5   7.7    ?Total Bilirubin 0.3 - 1.2 mg/dL 0.6   0.5   0.3    ?Alkaline Phos 38 - 126 U/L 96   83   84    ?AST 15 - 41 U/L 10   13   13     ?ALT 0 - 44 U/L 8   11   13     ? ? ?Lab Results  ?Component Value Date  ? WBC 7.1 10/05/2021  ? HGB 12.5 10/05/2021  ? HCT 38.3 10/05/2021  ? MCV 85.5 10/05/2021  ? PLT 167 10/05/2021  ? NEUTROABS 5.3 10/05/2021  ? ? ?ASSESSMENT & PLAN:  ?Malignant neoplasm metastatic to liver [facility]) ?Liver partial resection segment 7 and 8 06/14/2015: Poorly differentiated carcinoma 8.6 cm, margins negative,positive for ck8/18, ck 7, partially positive for ck20, polyclonalCEA, cd10 and negative for HEPAR, AFP, CEA, WT-1, cdx-2, ER, PR, GCDFP and TTF-1) ?Cancer type ID: 96% pancreaticobiliary origin, 60% cholangiocarcinoma, 36% gallbladder adenocarcinoma ?   ?Sigmoid colectomy 02/09/2016 for recurrent diverticulitis with chronic strictures, 4 lymph nodes negative ?------------------------------------------------------------------------------------------------------------------------------------------------------------- ?  ?CT abdomen 09/15/2018:CT abdomen: Continued decrease in the size of  the posterior right lobe liver mass measuring 1.5 x 1.2 x 0.9 cm, previously it was 1.7 x 1.5 x 1.7 cm ?  ?CT CAP 10/06/2021: At site of resection new heterogeneously enhancing mass 4.8 cm consistent with recurrent malignancy.  Newly enlarged epicardial lymph node.  Bilateral kidney stones. ? ?Current treatment: Stereotactic radiosurgery 11/08/2021-11/21/2021 ?We will obtain scans after discussing with radiation oncology about  the proper timing of the scans. ? ? ?No orders of the defined types were placed in this encounter. ? ?The patient has a good understanding of the overall plan. she agrees with it. she will call with any problems that may develop before the next visit here. ?Total time spent: 30 mins including face to face time and time spent for planning, charting and co-ordination of care ? ? Suzzette Righter, CMA ?12/05/21 ? ? ? I Gardiner Coins am scribing for Dr. Lindi Adie ? ?I have reviewed the above documentation for accuracy and completeness, and I agree with the above. ?  ?

## 2021-12-05 NOTE — Assessment & Plan Note (Signed)
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 ?Sigmoid colectomy 02/09/2016 for recurrent diverticulitis with chronic strictures, 4?lymph nodes negative ?------------------------------------------------------------------------------------------------------------------------------------------------------------- ?? ?CT abdomen 09/15/2018:CT abdomen: Continued decrease in the size of the posterior right lobe liver mass measuring 1.5 x 1.2 x 0.9 cm, previously it was 1.7 x 1.5 x 1.7 cm ?? ?CT CAP 10/06/2021: At site of resection new heterogeneously enhancing mass 4.8 cm consistent with recurrent malignancy.  Newly enlarged epicardial lymph node.  Bilateral kidney stones. ? ?Current treatment: Stereotactic radiosurgery 11/08/2021-11/21/2021 ?Our plan is observation ?

## 2021-12-06 ENCOUNTER — Other Ambulatory Visit: Payer: Self-pay | Admitting: Radiation Oncology

## 2021-12-06 ENCOUNTER — Ambulatory Visit
Admission: RE | Admit: 2021-12-06 | Discharge: 2021-12-06 | Disposition: A | Discharge status: Home / Self Care | Payer: Medicare Other | Source: Ambulatory Visit | Attending: Physician Assistant | Admitting: Physician Assistant

## 2021-12-06 ENCOUNTER — Other Ambulatory Visit: Payer: Self-pay | Admitting: Physician Assistant

## 2021-12-06 DIAGNOSIS — R1310 Dysphagia, unspecified: Secondary | ICD-10-CM

## 2021-12-06 DIAGNOSIS — K224 Dyskinesia of esophagus: Secondary | ICD-10-CM | POA: Diagnosis not present

## 2021-12-06 DIAGNOSIS — C787 Secondary malignant neoplasm of liver and intrahepatic bile duct: Secondary | ICD-10-CM

## 2021-12-06 DIAGNOSIS — K225 Diverticulum of esophagus, acquired: Secondary | ICD-10-CM | POA: Diagnosis not present

## 2021-12-08 ENCOUNTER — Telehealth: Payer: Self-pay | Admitting: *Deleted

## 2021-12-08 NOTE — Telephone Encounter (Signed)
CALLED PATIENT'S DAUGHTER- [name] TO INFORM OF CT, PATIENT'S DAUGHTER THINKS HER MOTHER SHOULDN'T HAVE A CT UNTIL A MONTH LATER, I EXPLAINED THAT I WOULD TALK WITH ALISON AND CALL HER BACK ON Monday, PATIENT'S DAUGHTER- [name] VERIFIED UNDERSTANDING THIS ?

## 2021-12-11 ENCOUNTER — Telehealth: Payer: Self-pay | Admitting: *Deleted

## 2021-12-11 NOTE — Telephone Encounter (Signed)
Spoke with the patient's daughter [name] to let her know her mom does need the CT scan that is scheduled for 12/12/21.  She verbalized understanding.  We discussed instructions for the scan.  She was informed we would give her a call with the results from the scan. ? ?Romelle Muldoon M. Leonie Green, BSN   ?

## 2021-12-12 ENCOUNTER — Ambulatory Visit (HOSPITAL_COMMUNITY)
Admission: RE | Admit: 2021-12-12 | Discharge: 2021-12-12 | Disposition: A | Discharge status: Home / Self Care | Payer: Medicare Other | Source: Ambulatory Visit | Attending: Radiation Oncology | Admitting: Radiation Oncology

## 2021-12-12 DIAGNOSIS — C3490 Malignant neoplasm of unspecified part of unspecified bronchus or lung: Secondary | ICD-10-CM | POA: Diagnosis not present

## 2021-12-12 DIAGNOSIS — K7689 Other specified diseases of liver: Secondary | ICD-10-CM | POA: Diagnosis not present

## 2021-12-12 DIAGNOSIS — J9811 Atelectasis: Secondary | ICD-10-CM | POA: Diagnosis not present

## 2021-12-12 DIAGNOSIS — R911 Solitary pulmonary nodule: Secondary | ICD-10-CM | POA: Diagnosis not present

## 2021-12-12 DIAGNOSIS — J90 Pleural effusion, not elsewhere classified: Secondary | ICD-10-CM | POA: Diagnosis not present

## 2021-12-12 DIAGNOSIS — C787 Secondary malignant neoplasm of liver and intrahepatic bile duct: Secondary | ICD-10-CM | POA: Diagnosis not present

## 2021-12-12 LAB — POCT I-STAT CREATININE: Creatinine, Ser: 1.4 mg/dL — ABNORMAL HIGH (ref 0.44–1.00)

## 2021-12-12 MED ORDER — IOHEXOL 300 MG/ML  SOLN
80.0000 mL | Freq: Once | INTRAMUSCULAR | Status: AC | PRN
  Administered 2021-12-12: 80 mL via INTRAVENOUS

## 2021-12-12 MED ORDER — SODIUM CHLORIDE (PF) 0.9 % IJ SOLN
INTRAMUSCULAR | Status: AC
  Filled 2021-12-12: qty 50

## 2021-12-14 ENCOUNTER — Telehealth: Payer: Self-pay | Admitting: Radiation Oncology

## 2021-12-14 ENCOUNTER — Other Ambulatory Visit: Payer: Self-pay | Admitting: Radiation Oncology

## 2021-12-14 DIAGNOSIS — C787 Secondary malignant neoplasm of liver and intrahepatic bile duct: Secondary | ICD-10-CM

## 2021-12-14 NOTE — Telephone Encounter (Signed)
Orders placed for next scan ?

## 2021-12-14 NOTE — Progress Notes (Signed)
I spoke with the patient's daughter regarding her CT scan and Dr. Ida Rogue recommendations for repeat in 4 months time.  Fortunately her epicardial lymph node has improved in size.  The treated site from her prior radiation is also improved, a small pulmonary nodule was noted measuring up to 4 mm in the right upper lobe.  Since this was not seen on prior examination we will repeat the scan as outlined in 4 months time. ?

## 2021-12-18 NOTE — Progress Notes (Signed)
? ?                                                                                                                                                          ?  Patient Name: Destiny Velasquez ?MRN: [id number] ?DOB: [birth year] ?Referring Physician: Nicholas Lose (Profile Not Attached) ?Date of Service: 11/21/2021 ?[facility] [place] ? ?                                                      End Of Treatment Note ? ?Diagnoses: C22.9-Malignant neoplasm of liver, not specified as primary or secondary ? ?Cancer Staging: Recurrent adenocarcinoma of the liver versus intrahepatic cholangiocarcinoma ? ?Intent: Curative ? ?Radiation Treatment Dates: 11/08/2021 through 11/21/2021 ?Site Technique Total Dose (Gy) Dose per Fx (Gy) Completed Fx Beam Energies  ?Liver: Liver_UHRT IMRT 50/50 5 10/10 10XFFF  ? ?Narrative: The patient tolerated radiation therapy but did have ongoing symptoms that predated her radiation regarding dysphagia and GERD like symptoms. She will follow up with [facility] regarding these symptoms. ? ?Plan: The patient will receive a call in about one month from the radiation oncology department. She will continue follow up with Dr. Lindi Adie as well.  ? ?________________________________________________ ? ? ? ?Carola Rhine, PAC  ?

## 2021-12-25 ENCOUNTER — Ambulatory Visit
Admission: RE | Admit: 2021-12-25 | Payer: Medicare Other | Source: Ambulatory Visit | Attending: Radiation Oncology | Admitting: Radiation Oncology

## 2022-01-04 ENCOUNTER — Other Ambulatory Visit: Payer: Self-pay | Admitting: Gastroenterology

## 2022-01-04 DIAGNOSIS — K219 Gastro-esophageal reflux disease without esophagitis: Secondary | ICD-10-CM | POA: Diagnosis not present

## 2022-01-04 DIAGNOSIS — R1310 Dysphagia, unspecified: Secondary | ICD-10-CM | POA: Diagnosis not present

## 2022-01-04 DIAGNOSIS — K224 Dyskinesia of esophagus: Secondary | ICD-10-CM | POA: Diagnosis not present

## 2022-01-04 DIAGNOSIS — K222 Esophageal obstruction: Secondary | ICD-10-CM | POA: Diagnosis not present

## 2022-01-04 DIAGNOSIS — C229 Malignant neoplasm of liver, not specified as primary or secondary: Secondary | ICD-10-CM | POA: Diagnosis not present

## 2022-01-09 DIAGNOSIS — C229 Malignant neoplasm of liver, not specified as primary or secondary: Secondary | ICD-10-CM | POA: Diagnosis not present

## 2022-01-09 DIAGNOSIS — E559 Vitamin D deficiency, unspecified: Secondary | ICD-10-CM | POA: Diagnosis not present

## 2022-03-06 DIAGNOSIS — N3281 Overactive bladder: Secondary | ICD-10-CM | POA: Diagnosis not present

## 2022-03-06 DIAGNOSIS — N3020 Other chronic cystitis without hematuria: Secondary | ICD-10-CM | POA: Diagnosis not present

## 2022-03-08 ENCOUNTER — Encounter: Payer: Self-pay | Admitting: Radiation Oncology

## 2022-03-08 ENCOUNTER — Telehealth: Payer: Self-pay | Admitting: *Deleted

## 2022-03-08 NOTE — Telephone Encounter (Signed)
CALLED PATIENT TO INFORM OF CT FOR 04-13-22- ARRIVAL TIME- 12 PM @ [facility], PATIENT TO I-STAT LABS IN RADIOLOGY AND HER CT TO FOLLOW- PATIENT TO BE NPO- 4 HRS. PRIOR TO TEST, PATIENT TO RECEIVE RESULTS FROM ALISON PERKINS ON 04-23-22 @ 1:30 PM VIA TELEPHONE, SPOKE WITH PATIENT'S DAUGHTER [name] JONES AND SHE IS AWARE OF THESE APPTS. AND THE INSTRUCTIONS

## 2022-03-12 DIAGNOSIS — E46 Unspecified protein-calorie malnutrition: Secondary | ICD-10-CM | POA: Diagnosis not present

## 2022-03-12 DIAGNOSIS — C229 Malignant neoplasm of liver, not specified as primary or secondary: Secondary | ICD-10-CM | POA: Diagnosis not present

## 2022-03-12 DIAGNOSIS — C787 Secondary malignant neoplasm of liver and intrahepatic bile duct: Secondary | ICD-10-CM | POA: Diagnosis not present

## 2022-03-12 DIAGNOSIS — E039 Hypothyroidism, unspecified: Secondary | ICD-10-CM | POA: Diagnosis not present

## 2022-03-12 DIAGNOSIS — E559 Vitamin D deficiency, unspecified: Secondary | ICD-10-CM | POA: Diagnosis not present

## 2022-03-13 DIAGNOSIS — I700 Atherosclerosis of aorta: Secondary | ICD-10-CM | POA: Diagnosis not present

## 2022-03-13 DIAGNOSIS — I2510 Atherosclerotic heart disease of native coronary artery without angina pectoris: Secondary | ICD-10-CM | POA: Diagnosis not present

## 2022-03-13 DIAGNOSIS — N184 Chronic kidney disease, stage 4 (severe): Secondary | ICD-10-CM | POA: Diagnosis not present

## 2022-03-13 DIAGNOSIS — R110 Nausea: Secondary | ICD-10-CM | POA: Diagnosis not present

## 2022-03-13 DIAGNOSIS — I129 Hypertensive chronic kidney disease with stage 1 through stage 4 chronic kidney disease, or unspecified chronic kidney disease: Secondary | ICD-10-CM | POA: Diagnosis not present

## 2022-03-13 DIAGNOSIS — N1832 Chronic kidney disease, stage 3b: Secondary | ICD-10-CM | POA: Diagnosis not present

## 2022-03-13 DIAGNOSIS — C229 Malignant neoplasm of liver, not specified as primary or secondary: Secondary | ICD-10-CM | POA: Diagnosis not present

## 2022-03-13 DIAGNOSIS — E785 Hyperlipidemia, unspecified: Secondary | ICD-10-CM | POA: Diagnosis not present

## 2022-03-13 DIAGNOSIS — R301 Vesical tenesmus: Secondary | ICD-10-CM | POA: Diagnosis not present

## 2022-03-21 ENCOUNTER — Telehealth: Payer: Self-pay | Admitting: *Deleted

## 2022-03-21 NOTE — Telephone Encounter (Signed)
CALLED PATIENT TO INFORM OF CT BEING MOVED TO 03-28-22 - ARRIVAL TIME- 4 PM @ [facility], PATIENT TO BE NPO- 4 HRS. PRIOR TO TEST, PATIENT TO PICK- UP DRINK IN RADIOLOGY DAY BEFORE TEST, PATIENT TO RECEIVE RESULTS FROM ALISON PERKINS ON 04-02-22 @ 3:30 PM, VIA TELEPHONE, SPOKE WITH PATIENT'S DAUGHTER - [name] JONES AND SHE IS AWARE OF THESE APPTS. AND THE INSTRUCTIONS

## 2022-03-22 ENCOUNTER — Emergency Department (HOSPITAL_BASED_OUTPATIENT_CLINIC_OR_DEPARTMENT_OTHER): Payer: Medicare Other

## 2022-03-22 ENCOUNTER — Inpatient Hospital Stay (HOSPITAL_BASED_OUTPATIENT_CLINIC_OR_DEPARTMENT_OTHER)
Admission: EM | Admit: 2022-03-22 | Discharge: 2022-03-24 | DRG: 181 | Disposition: A | Discharge status: Hospice | Payer: Medicare Other | Attending: Internal Medicine | Admitting: Internal Medicine

## 2022-03-22 ENCOUNTER — Encounter (HOSPITAL_BASED_OUTPATIENT_CLINIC_OR_DEPARTMENT_OTHER): Payer: Self-pay | Admitting: Emergency Medicine

## 2022-03-22 ENCOUNTER — Other Ambulatory Visit: Payer: Self-pay

## 2022-03-22 ENCOUNTER — Encounter (HOSPITAL_COMMUNITY): Payer: Self-pay

## 2022-03-22 DIAGNOSIS — R1310 Dysphagia, unspecified: Secondary | ICD-10-CM | POA: Diagnosis not present

## 2022-03-22 DIAGNOSIS — C787 Secondary malignant neoplasm of liver and intrahepatic bile duct: Secondary | ICD-10-CM | POA: Diagnosis present

## 2022-03-22 DIAGNOSIS — Z8249 Family history of ischemic heart disease and other diseases of the circulatory system: Secondary | ICD-10-CM | POA: Diagnosis not present

## 2022-03-22 DIAGNOSIS — K219 Gastro-esophageal reflux disease without esophagitis: Secondary | ICD-10-CM | POA: Diagnosis present

## 2022-03-22 DIAGNOSIS — Z809 Family history of malignant neoplasm, unspecified: Secondary | ICD-10-CM | POA: Diagnosis not present

## 2022-03-22 DIAGNOSIS — I6782 Cerebral ischemia: Secondary | ICD-10-CM | POA: Diagnosis not present

## 2022-03-22 DIAGNOSIS — E782 Mixed hyperlipidemia: Secondary | ICD-10-CM | POA: Diagnosis not present

## 2022-03-22 DIAGNOSIS — I129 Hypertensive chronic kidney disease with stage 1 through stage 4 chronic kidney disease, or unspecified chronic kidney disease: Secondary | ICD-10-CM | POA: Diagnosis present

## 2022-03-22 DIAGNOSIS — C7802 Secondary malignant neoplasm of left lung: Secondary | ICD-10-CM | POA: Diagnosis present

## 2022-03-22 DIAGNOSIS — Z853 Personal history of malignant neoplasm of breast: Secondary | ICD-10-CM | POA: Diagnosis not present

## 2022-03-22 DIAGNOSIS — Z9049 Acquired absence of other specified parts of digestive tract: Secondary | ICD-10-CM | POA: Diagnosis not present

## 2022-03-22 DIAGNOSIS — I10 Essential (primary) hypertension: Secondary | ICD-10-CM

## 2022-03-22 DIAGNOSIS — R0602 Shortness of breath: Secondary | ICD-10-CM | POA: Diagnosis not present

## 2022-03-22 DIAGNOSIS — I358 Other nonrheumatic aortic valve disorders: Secondary | ICD-10-CM | POA: Diagnosis not present

## 2022-03-22 DIAGNOSIS — G319 Degenerative disease of nervous system, unspecified: Secondary | ICD-10-CM | POA: Diagnosis not present

## 2022-03-22 DIAGNOSIS — J329 Chronic sinusitis, unspecified: Secondary | ICD-10-CM | POA: Diagnosis not present

## 2022-03-22 DIAGNOSIS — K222 Esophageal obstruction: Secondary | ICD-10-CM | POA: Insufficient documentation

## 2022-03-22 DIAGNOSIS — J9811 Atelectasis: Secondary | ICD-10-CM | POA: Diagnosis not present

## 2022-03-22 DIAGNOSIS — N1832 Chronic kidney disease, stage 3b: Secondary | ICD-10-CM | POA: Diagnosis present

## 2022-03-22 DIAGNOSIS — Z79899 Other long term (current) drug therapy: Secondary | ICD-10-CM | POA: Diagnosis not present

## 2022-03-22 DIAGNOSIS — J910 Malignant pleural effusion: Secondary | ICD-10-CM | POA: Diagnosis present

## 2022-03-22 DIAGNOSIS — Z85118 Personal history of other malignant neoplasm of bronchus and lung: Secondary | ICD-10-CM | POA: Diagnosis not present

## 2022-03-22 DIAGNOSIS — Z20822 Contact with and (suspected) exposure to covid-19: Secondary | ICD-10-CM | POA: Diagnosis present

## 2022-03-22 DIAGNOSIS — Z8673 Personal history of transient ischemic attack (TIA), and cerebral infarction without residual deficits: Secondary | ICD-10-CM | POA: Diagnosis not present

## 2022-03-22 DIAGNOSIS — N200 Calculus of kidney: Secondary | ICD-10-CM | POA: Diagnosis not present

## 2022-03-22 DIAGNOSIS — Z90710 Acquired absence of both cervix and uterus: Secondary | ICD-10-CM | POA: Diagnosis not present

## 2022-03-22 DIAGNOSIS — K8689 Other specified diseases of pancreas: Secondary | ICD-10-CM | POA: Diagnosis not present

## 2022-03-22 DIAGNOSIS — C801 Malignant (primary) neoplasm, unspecified: Secondary | ICD-10-CM | POA: Diagnosis not present

## 2022-03-22 DIAGNOSIS — Z66 Do not resuscitate: Secondary | ICD-10-CM | POA: Diagnosis present

## 2022-03-22 DIAGNOSIS — C7801 Secondary malignant neoplasm of right lung: Secondary | ICD-10-CM | POA: Diagnosis present

## 2022-03-22 DIAGNOSIS — Z87891 Personal history of nicotine dependence: Secondary | ICD-10-CM | POA: Diagnosis not present

## 2022-03-22 DIAGNOSIS — D7389 Other diseases of spleen: Secondary | ICD-10-CM | POA: Diagnosis not present

## 2022-03-22 DIAGNOSIS — R059 Cough, unspecified: Secondary | ICD-10-CM | POA: Diagnosis not present

## 2022-03-22 DIAGNOSIS — J90 Pleural effusion, not elsewhere classified: Secondary | ICD-10-CM | POA: Diagnosis present

## 2022-03-22 DIAGNOSIS — Z515 Encounter for palliative care: Secondary | ICD-10-CM

## 2022-03-22 DIAGNOSIS — Z923 Personal history of irradiation: Secondary | ICD-10-CM

## 2022-03-22 DIAGNOSIS — R918 Other nonspecific abnormal finding of lung field: Secondary | ICD-10-CM | POA: Diagnosis not present

## 2022-03-22 DIAGNOSIS — E039 Hypothyroidism, unspecified: Secondary | ICD-10-CM | POA: Diagnosis present

## 2022-03-22 DIAGNOSIS — E7800 Pure hypercholesterolemia, unspecified: Secondary | ICD-10-CM | POA: Insufficient documentation

## 2022-03-22 DIAGNOSIS — Z9011 Acquired absence of right breast and nipple: Secondary | ICD-10-CM

## 2022-03-22 LAB — CBC WITH DIFFERENTIAL/PLATELET
Abs Immature Granulocytes: 0.02 10*3/uL (ref 0.00–0.07)
Basophils Absolute: 0.1 10*3/uL (ref 0.0–0.1)
Basophils Relative: 1 %
Eosinophils Absolute: 0.3 10*3/uL (ref 0.0–0.5)
Eosinophils Relative: 3 %
HCT: 43.8 % (ref 36.0–46.0)
Hemoglobin: 14.7 g/dL (ref 12.0–15.0)
Immature Granulocytes: 0 %
Lymphocytes Relative: 7 %
Lymphs Abs: 0.6 10*3/uL — ABNORMAL LOW (ref 0.7–4.0)
MCH: 29 pg (ref 26.0–34.0)
MCHC: 33.6 g/dL (ref 30.0–36.0)
MCV: 86.4 fL (ref 80.0–100.0)
Monocytes Absolute: 0.8 10*3/uL (ref 0.1–1.0)
Monocytes Relative: 9 %
Neutro Abs: 6.5 10*3/uL (ref 1.7–7.7)
Neutrophils Relative %: 80 %
Platelets: 217 10*3/uL (ref 150–400)
RBC: 5.07 MIL/uL (ref 3.87–5.11)
RDW: 13.3 % (ref 11.5–15.5)
WBC: 8.2 10*3/uL (ref 4.0–10.5)
nRBC: 0 % (ref 0.0–0.2)

## 2022-03-22 LAB — RESP PANEL BY RT-PCR (FLU A&B, COVID) ARPGX2
Influenza A by PCR: NEGATIVE
Influenza B by PCR: NEGATIVE
SARS Coronavirus 2 by RT PCR: NEGATIVE

## 2022-03-22 LAB — URINALYSIS, ROUTINE W REFLEX MICROSCOPIC
Bilirubin Urine: NEGATIVE
Glucose, UA: NEGATIVE mg/dL
Ketones, ur: NEGATIVE mg/dL
Nitrite: NEGATIVE
Protein, ur: NEGATIVE mg/dL
Specific Gravity, Urine: 1.015 (ref 1.005–1.030)
pH: 7.5 (ref 5.0–8.0)

## 2022-03-22 LAB — TROPONIN I (HIGH SENSITIVITY)
Troponin I (High Sensitivity): 6 ng/L (ref <18)
Troponin I (High Sensitivity): 5 ng/L (ref <18)

## 2022-03-22 LAB — PHOSPHORUS: Phosphorus: 2.6 mg/dL (ref 2.5–4.6)

## 2022-03-22 LAB — BRAIN NATRIURETIC PEPTIDE: B Natriuretic Peptide: 72.1 pg/mL (ref 0.0–100.0)

## 2022-03-22 LAB — COMPREHENSIVE METABOLIC PANEL
ALT: 13 U/L (ref 0–44)
AST: 16 U/L (ref 15–41)
Albumin: 3.3 g/dL — ABNORMAL LOW (ref 3.5–5.0)
Alkaline Phosphatase: 75 U/L (ref 38–126)
Anion gap: 9 (ref 5–15)
BUN: 13 mg/dL (ref 8–23)
CO2: 29 mmol/L (ref 22–32)
Calcium: 11.1 mg/dL — ABNORMAL HIGH (ref 8.9–10.3)
Chloride: 100 mmol/L (ref 98–111)
Creatinine, Ser: 1.56 mg/dL — ABNORMAL HIGH (ref 0.44–1.00)
GFR, Estimated: 31 mL/min — ABNORMAL LOW (ref >60)
Glucose, Bld: 109 mg/dL — ABNORMAL HIGH (ref 70–99)
Potassium: 3.9 mmol/L (ref 3.5–5.1)
Sodium: 138 mmol/L (ref 135–145)
Total Bilirubin: 0.7 mg/dL (ref 0.3–1.2)
Total Protein: 7.3 g/dL (ref 6.5–8.1)

## 2022-03-22 LAB — URINALYSIS, MICROSCOPIC (REFLEX)

## 2022-03-22 LAB — MAGNESIUM: Magnesium: 2.2 mg/dL (ref 1.7–2.4)

## 2022-03-22 LAB — LACTIC ACID, PLASMA: Lactic Acid, Venous: 1.1 mmol/L (ref 0.5–1.9)

## 2022-03-22 LAB — LIPASE, BLOOD: Lipase: 35 U/L (ref 11–51)

## 2022-03-22 MED ORDER — ACETAMINOPHEN 325 MG PO TABS: 650.0000 mg | ORAL_TABLET | Freq: Four times a day (QID) | ORAL | Status: DC | PRN

## 2022-03-22 MED ORDER — POLYETHYLENE GLYCOL 3350 17 G PO PACK: 17.0000 g | PACK | Freq: Every day | ORAL | Status: DC | PRN

## 2022-03-22 MED ORDER — MAGNESIUM OXIDE -MG SUPPLEMENT 400 (240 MG) MG PO TABS
200.0000 mg | ORAL_TABLET | Freq: Every day | ORAL | Status: DC
  Administered 2022-03-23 – 2022-03-24 (×2): 200 mg via ORAL
  Filled 2022-03-22 (×2): qty 1

## 2022-03-22 MED ORDER — ONDANSETRON HCL 4 MG/2ML IJ SOLN
4.0000 mg | Freq: Four times a day (QID) | INTRAMUSCULAR | Status: DC | PRN
  Administered 2022-03-23: 4 mg via INTRAVENOUS
  Filled 2022-03-22: qty 2

## 2022-03-22 MED ORDER — LEVOTHYROXINE SODIUM 100 MCG PO TABS
100.0000 ug | ORAL_TABLET | Freq: Every day | ORAL | Status: DC
  Administered 2022-03-24: 100 ug via ORAL
  Filled 2022-03-22 (×2): qty 1

## 2022-03-22 MED ORDER — LACTATED RINGERS IV SOLN: INTRAVENOUS | Status: AC

## 2022-03-22 MED ORDER — HYDRALAZINE HCL 20 MG/ML IJ SOLN: 10.0000 mg | Freq: Four times a day (QID) | INTRAMUSCULAR | Status: DC | PRN

## 2022-03-22 MED ORDER — LACTATED RINGERS IV SOLN: INTRAVENOUS | Status: DC

## 2022-03-22 MED ORDER — AMLODIPINE BESYLATE 5 MG PO TABS
5.0000 mg | ORAL_TABLET | Freq: Every day | ORAL | Status: DC
  Administered 2022-03-23 – 2022-03-24 (×2): 5 mg via ORAL
  Filled 2022-03-22 (×2): qty 1

## 2022-03-22 MED ORDER — ACETAMINOPHEN 650 MG RE SUPP: 650.0000 mg | Freq: Four times a day (QID) | RECTAL | Status: DC | PRN

## 2022-03-22 MED ORDER — ONDANSETRON HCL 4 MG PO TABS: 4.0000 mg | ORAL_TABLET | Freq: Four times a day (QID) | ORAL | Status: DC | PRN

## 2022-03-22 MED ORDER — PANTOPRAZOLE SODIUM 40 MG PO TBEC
40.0000 mg | DELAYED_RELEASE_TABLET | Freq: Every day | ORAL | Status: DC
  Administered 2022-03-23: 40 mg via ORAL
  Filled 2022-03-22: qty 1

## 2022-03-22 MED ORDER — NUTRISOURCE FIBER PO PACK
1.0000 | PACK | Freq: Every day | ORAL | Status: DC
  Filled 2022-03-22 (×2): qty 1

## 2022-03-22 MED ORDER — NIACIN ER (ANTIHYPERLIPIDEMIC) 500 MG PO TBCR
500.0000 mg | EXTENDED_RELEASE_TABLET | Freq: Every day | ORAL | Status: DC
  Administered 2022-03-23: 500 mg via ORAL
  Filled 2022-03-22 (×2): qty 1

## 2022-03-22 MED ORDER — DOCUSATE SODIUM 100 MG PO CAPS
100.0000 mg | ORAL_CAPSULE | ORAL | Status: DC
  Filled 2022-03-22: qty 1

## 2022-03-22 NOTE — H&P (Addendum)
History and Physical    Patient: Destiny Velasquez MRN: [id number] DOA: 03/22/2022  Date of Service: the patient was seen and examined on 03/22/2022  Patient coming from: Home  Chief Complaint:  Chief Complaint  Patient presents with   Cough    HPI:   88 year old female with past medical history of , adenocarcinoma of the liver diagnosed in 2016, sigmoid diverticulitis (S/P sigmoidectomy due to colovesical fistula), hyperlipidemia, hypertension, chronic kidney disease stage IIIb, gastroesophageal reflux disease presenting to [place] emergency department with complaints of cough.  Patient explains that for approximate the past 10 days she has been experiencing increasing fatigue.  Initially fatigue was mild in intensity but progressively became more more severe.  Fatigue has become associated with dyspnea on exertion although patient denies any chest pain paroxysmal nocturnal dyspnea or  or pillow orthopnea.  As patient's symptoms continue to worsen she began to develop associated worsening appetite and shortness of breath.  Upon further questioning patient denies fever, sick contacts, recent travel or contact with confirmed COVID-19 infection.  Due to patient's progressively worsening symptoms she eventually presented to [facility] emergency department for evaluation.  Upon evaluation in the emergency department chest imaging identified a large right-sided pleural effusion as well as an enlarging soft tissue mass at the hepatic resection site as well as small bilateral pulmonary nodules all concerning for progressive metastatic disease.  Considering patient's substantial symptoms hospitalist group has been called and patient has been accepted for transfer to [facility] for continued medical management including possible thoracentesis.     Review of Systems: Review of Systems  Constitutional:  Positive for malaise/fatigue.  Respiratory:  Positive for cough  and shortness of breath.   Neurological:  Positive for dizziness and weakness.  All other systems reviewed and are negative.    Past Medical History:  Diagnosis Date   Anal and rectal polyp    Breast cancer, right breast ([place]) 1971   "never had chemo or radiation"   Depression    Disorder of bone and cartilage, unspecified    Diverticulitis of large intestine without perforation or abscess without bleeding    Diverticulosis of colon (without mention of hemorrhage)    Essential and other specified forms of tremor    Family history of adverse reaction to anesthesia    daughter and grandson are slow to wake up   Frequent UTI    GERD (gastroesophageal reflux disease)    Hyperlipidemia    Hypertension    Liver cancer ([place]) 2016   Malignant neoplasm of breast (female), unspecified site    Pneumonia X 1   Stroke [facility])    after liver surgery - in 2016 no lasting weakness. does walk with walker   Subacute delirium    Thyroid disease    Unspecified constipation    Unspecified hypothyroidism     Past Surgical History:  Procedure Laterality Date   ABDOMINAL HYSTERECTOMY     APPENDECTOMY     BREAST BIOPSY Right 1971   CATARACT EXTRACTION W/ INTRAOCULAR LENS  IMPLANT, BILATERAL Bilateral    CHOLECYSTECTOMY     CYSTOSCOPY W/ URETERAL STENT PLACEMENT Bilateral 02/09/2016   Procedure: CYSTOSCOPY WITH RETROGRADE PYELOGRAM/ BILATERAL TEMPORARY INDWELLING URETERAL STENT PLACEMENT;  Surgeon: Ardis Hughs, MD;  Location: [place];  Service: Urology;  Laterality: Bilateral;   LAPAROSCOPY N/A 06/14/2015   Procedure: LAPAROSCOPY DIAGNOSTIC;  Surgeon: [name], MD;  Location: [facility];  Service: General;  Laterality: N/A;   LIVER  BIOPSY  06/14/2015   Procedure: LIVER BIOPSY;  Surgeon: [name], MD;  Location: [facility];  Service: General;;   MASTECTOMY Right 1971   OPEN PARTIAL HEPATECTOMY  N/A 06/14/2015   Procedure:  OPEN PARTIAL RIGHT HEPATECTOMY;  Surgeon: [name], MD;  Location: [facility];  Service: General;  Laterality: N/A;  converted to open @ 1447   PARTIAL COLECTOMY N/A 02/09/2016   Procedure: OPEN SIGMOID  COLECTOMY WITH REPAIR COLOVESICAL AND COLOVAGINAL FISTULA;  Surgeon: [name], MD;  Location: [place];  Service: General;  Laterality: N/A;   RECTAL POLYPECTOMY  early 2000's   TONSILLECTOMY AND ADENOIDECTOMY  1935   TUBAL LIGATION      Social History:  reports that she quit smoking about 9 years ago. Her smoking use included cigarettes. She has a 20.00 pack-year smoking history. She has never used smokeless tobacco. She reports current alcohol use. She reports that she does not use drugs.  Allergies  Allergen Reactions   Ephedrine Hcl Other (See Comments)    Made the patient "feel funny"   Levofloxacin Other (See Comments)    "Unintelligible" and caused speech difficulty- "allergic," per facility   Miconazole Other (See Comments)    Reaction not known    Family History  Problem Relation Age of Onset   Cancer Mother    Hypertension Father     Prior to Admission medications   Medication Sig Start Date End Date Taking? Authorizing Provider  acetaminophen (TYLENOL) 325 MG tablet Take 650 mg by mouth every 6 (six) hours as needed (pain).     [provider]  amLODipine (NORVASC) 5 MG tablet Take by mouth. 09/19/21   [provider]  bisacodyl (DULCOLAX) 5 MG EC tablet Take 5 mg by mouth once. Took 4 times for bowel prep    [provider]  Cranberry 200 MG CAPS Take 200 mg by mouth daily.    [provider]  guaiFENesin (MUCINEX) 600 MG 12 hr tablet Take by mouth. 09/25/21   [provider]  hydrocortisone 1 % ointment Apply topically. 03/23/21   [provider]  ibuprofen (ADVIL,MOTRIN) 400 MG tablet Take 400 mg by mouth every 6 (six) hours as needed.    [provider]  loratadine (CLARITIN) 10 MG tablet Take by mouth. 12/26/16   [provider]  nitrofurantoin (MACRODANTIN) 100 MG  capsule Take by mouth. 05/04/21   [provider]  omeprazole (PRILOSEC) 20 MG capsule Take 20 mg by mouth daily.    [provider]  ondansetron (ZOFRAN) 8 MG tablet Take 1 tablet (8 mg total) by mouth every 8 (eight) hours as needed for nausea or vomiting. 11/13/21   Hayden Pedro, PA-C  polyethylene glycol [facility]) packet Take 17 g by mouth daily.    [provider]  simethicone (MYLICON) 469 MG chewable tablet Chew 125 mg by mouth 3 (three) times daily before meals.    [provider]  Sod Fluoride-Potassium Nitrate (PREVIDENT 5000 ENAMEL PROTECT) 1.1-5 % GEL  12/02/19   [provider]  Vitamin D, Ergocalciferol, (DRISDOL) 50000 units CAPS capsule Take 50,000 Units by mouth every 7 (seven) days.    [provider]    Physical Exam:  Vitals:   03/22/22 1415 03/22/22 1612 03/22/22 1715 03/22/22 2019  BP: (!) 154/63  (!) 150/79 (!) 147/66  Pulse: 76  74 76  Resp: 19  20 18   Temp:  97.9 F (36.6 C) 98 F (36.7 C)  97.9 F (36.6 C)  TempSrc:  Oral Oral Oral  SpO2: 93%  91% 93%  Weight:    58.7 kg  Height:        Constitutional: Awake alert and oriented x3, no associated distress.   Skin: no rashes, no lesions, poor skin turgor noted. Eyes: Pupils are equally reactive to light.  No evidence of scleral icterus or conjunctival pallor.  ENMT: Dry mucous membranes noted.  Posterior pharynx clear of any exudate or lesions.   Neck: normal, supple, no masses, no thyromegaly.  No evidence of jugular venous distension.   Respiratory: Markedly diminished breath sounds throughout the right lung fields no wheezing, no crackles. Normal respiratory effort. No accessory muscle use.  Cardiovascular: Regular rate and rhythm, no murmurs / rubs / gallops. No extremity edema. 2+ pedal pulses. No carotid bruits.  Chest:   Nontender without crepitus or deformity.   Back:   Nontender without crepitus or deformity. Abdomen: Abdomen is  soft and nontender.  No evidence of intra-abdominal masses.  Positive bowel sounds noted in all quadrants.   Musculoskeletal: No joint deformity upper and lower extremities. Good ROM, no contractures. Normal muscle tone.  Neurologic: CN 2-12 grossly intact. Sensation intact.  Patient moving all 4 extremities spontaneously.  Patient is following all commands.  Patient is responsive to verbal stimuli.   Psychiatric: Patient exhibits normal mood with appropriate affect.  Patient seems to possess insight as to their current situation.    Data Reviewed:  I have personally reviewed and interpreted labs, imaging.  Significant findings are:  Chemistry revealing sodium 138, potassium 3.9, chloride 100, bicarbonate 29, BUN 13, creatinine 1.56 CBC revealing white blood cell count 8.2, hemoglobin 14.7, hematocrit 43.8, platelet count of 217 Calcium 11.1 Lactic acid 1.1 COVID-19 PCR testing negative Initial troponin 6, second opponent 5  EKG: Personally reviewed.  Rhythm is normal sinus rhythm with heart rate of 82 bpm.  No dynamic ST segment changes appreciated.   Assessment and Plan: * Pleural effusion on right Patient presenting with at least 10-day history of progressively worsening generalized malaise and weakness with a 1 to 2-day history of increasing shortness of breath and dry nonproductive cough Patient found to have substantial right-sided pleural effusion on CT imaging which is the most likely cause of Etiology is most likely secondary to malignancy considering other sequela of progressive metastatic disease on CT imaging including enlarging mass at the hepatic resection site and bilateral pulmonary nodules. Unlikely to be secondary to infection in the absence of fever or leukocytosis Unlikely to be secondary to congestive heart failure.  Will obtain a BNP and if this is markedly elevated we will follow this up with an echocardiogram. Will obtain IR guided thoracentesis which will prove to  be diagnostic and therapeutic IR consultation ordered for the morning N.p.o. after midnight Supplemental oxygen as necessary for bouts of hypoxia  Malignant neoplasm metastatic to liver [facility]) Originally diagnosed in 2016 status post liver resection Recurrence in 2018 status post radiation therapy Another recurrence noted 09/2021 with heterogenously enhancing hepatic mass as well as enlarged epicardial lymph node and pulmonary nodule status post stereotactic radiosurgery 11/08/2021-11/21/2021 followed by radiation therapy Follows with Dr. Lindi Adie with Oncology I have placed Dr. Lindi Adie on the treatment team to follow along, his input is appreciated especially considering the likely presence of progressive metastatic disease based on review of the CT results.  Hypercalcemia Notable mild hypercalcemia on initial chemistry Considering how mild hypercalcemia is this is more likely related to volume  depletion or possibly excess consumption of vitamin D and less likely related to malignancy although it is still possible Holding vitamin D for now as patient is on an extremely high dose regimen in the outpatient setting. Will start with gentle hydration overnight and reassess calcium levels in the morning as well as obtaining an ionized calcium  Chronic kidney disease, stage 3b (HCC) Strict intake and output monitoring Creatinine near baseline Minimizing nephrotoxic agents as much as possible Serial chemistries to monitor renal function and electrolytes   Essential hypertension Resume patients home regimen of oral antihypertensives Resuming amlodipine for now, will resume additional agents once we are able to obtain the patient's full medication list as we have been unsuccessful and identify her home medications up to this point. PRN intravenous antihypertensives for excessively elevated blood pressure    GERD without esophagitis Continuing home regimen of daily PPI  therapy.   Dysphagia Patient reports some transient dysphagia and was last evaluated by speech therapy at her assisted living facility in May according to the daughter Will obtain repeat speech therapy evaluation during this hospitalization due to patient's continued complaints  Mixed hyperlipidemia Documented history of hyperlipidemia Unable to obtain an accurate medication list and patient is unaware of what she takes.  Pharmacy is attempting to coordinate this with the patient's assisted living facility.  Once we are able to obtain an accurate list we will resume lipid-lowering therapy if present  Hypothyroidism Resume home regimen of Synthroid         Code Status:  DNR  code status decision has been confirmed with: patient Family Communication: Daughter is at the bedside and has been updated on plan of care  Consults: Interventional Radiology, Oncology  Severity of Illness:  The appropriate patient status for this patient is OBSERVATION. Observation status is judged to be reasonable and necessary in order to provide the required intensity of service to ensure the patient's safety. The patient's presenting symptoms, physical exam findings, and initial radiographic and laboratory data in the context of their medical condition is felt to place them at decreased risk for further clinical deterioration. Furthermore, it is anticipated that the patient will be medically stable for discharge from the hospital within 2 midnights of admission.   Author:  Vernelle Emerald MD  03/22/2022 10:47 PM

## 2022-03-22 NOTE — ED Notes (Signed)
Up in chair to eat lunch , pt and daughter aware that she is to be admitted, pt wanted IV fluids stopped and they were

## 2022-03-22 NOTE — Assessment & Plan Note (Signed)
   Patient reports some transient dysphagia and was last evaluated by speech therapy at her assisted living facility in May according to the daughter  Will obtain repeat speech therapy evaluation during this hospitalization due to patient's continued complaints

## 2022-03-22 NOTE — Assessment & Plan Note (Addendum)
.   Resume patients home regimen of oral antihypertensives . Resuming amlodipine for now, will resume additional agents once we are able to obtain the patient's full medication list as we have been unsuccessful and identify her home medications up to this point. Marland Kitchen PRN intravenous antihypertensives for excessively elevated blood pressure

## 2022-03-22 NOTE — Assessment & Plan Note (Signed)
Continuing home regimen of daily PPI therapy.  

## 2022-03-22 NOTE — Progress Notes (Signed)
Plan of Care Note for accepted transfer   Patient: Destiny Velasquez MRN: [id number]   DOA: 03/22/2022  Facility requesting transfer: [facility].  Requesting Provider: Charlesetta Shanks, MD. Reason for transfer: Right pleural effusion.  Facility course:  Per Dr. Johnney Killian:  "Destiny Velasquez is a 88 y.o. female.   HPI Patient has history of carcinoma of the liver with resection in 2016.  She did not have recurrence or metastatic disease until February 2023.  CT scan identified new mass at prior hepatic resection site and lymph nodes consistent with metastatic disease.  Patient has been undergoing radiation therapy.  She has responded with some decrease in size of epicardial lymph node and pulmonary nodule.  However patient also has been diagnosed with a thickening or small lesion of the esophagus with difficulty swallowing solids and some aspirating.  Patient reports she has been increasingly generally weak.  Patient's daughter reports she was diagnosed with dehydration by her PCP.  Patient also notes that she has had increased difficulty eating and drinking due to her trouble with swallowing.  No fevers.  No vomiting but persistent nausea.  No documented fevers.  Patient has been dealing fairly persistently with dizziness and generalized weakness."  EXAM: PORTABLE CHEST 1 VIEW   COMPARISON:  02/12/2016 chest x-ray and CT of the chest on 01/01/2022   FINDINGS: There is new, significant opacity in the RIGHT hemithorax. A portion of this represents pleural effusion. The remainder of the opacity is consistent with atelectasis or consolidation. LEFT lung is clear. No pulmonary edema. No pneumothorax.   IMPRESSION: Significant increase in opacity of the RIGHT hemithorax, portion of which is pleural effusion. The remainder of the opacity could be related to atelectasis, or consolidation.  CT chest/Abdomen/pelvis   IMPRESSION: 1. Suspected progressive tumor recurrence along the  partial hepatectomy site, contiguous with the right hemidiaphragm. 2. Significant enlargement of a right pleural effusion with resulting inversion of the right hemidiaphragm. Although no definite pleural nodularity is identified on noncontrast imaging, this may reflect a malignant effusion. It certainly may contribute to the patient's shortness of breath. Consider thoracentesis. 3. Scattered new small pulmonary nodules bilaterally, suspicious for metastatic disease. 4. Nonobstructing bilateral renal calculi. Coronary and Aortic Atherosclerosis (ICD10-I70.0). Additional incidental findings as detailed above.   Plan of care: The patient is accepted for admission to Telemetry unit, at [facility]. She will need IR evaluation for possible therapeutic thoracentesis.   Author: Reubin Milan, MD 03/22/2022  Check [email or web address] for on-call coverage.  Nursing staff, Please call [place] number on Amion as soon as patient's arrival, so appropriate admitting provider can evaluate the pt.

## 2022-03-22 NOTE — Assessment & Plan Note (Signed)
Strict intake and output monitoring Creatinine near baseline Minimizing nephrotoxic agents as much as possible Serial chemistries to monitor renal function and electrolytes  

## 2022-03-22 NOTE — Assessment & Plan Note (Addendum)
   Patient presenting with at least 10-day history of progressively worsening generalized malaise and weakness with a 1 to 2-day history of increasing shortness of breath and dry nonproductive cough  Patient found to have substantial right-sided pleural effusion on CT imaging which is the most likely cause of  Etiology is most likely secondary to malignancy considering other sequela of progressive metastatic disease on CT imaging including enlarging mass at the hepatic resection site and bilateral pulmonary nodules.  Unlikely to be secondary to infection in the absence of fever or leukocytosis  Unlikely to be secondary to congestive heart failure.  Will obtain a BNP and if this is markedly elevated we will follow this up with an echocardiogram.  Will obtain IR guided thoracentesis which will prove to be diagnostic and therapeutic  IR consultation ordered for the morning  N.p.o. after midnight  Supplemental oxygen as necessary for bouts of hypoxia

## 2022-03-22 NOTE — ED Triage Notes (Addendum)
Pt from [place] Had a cough  after drinking water and she never got back to feeling normal just dx with liver cancer with a small  spot/lesion  on lung and esophagus  on her lung , she has had radiation saw dr 8/1 and had labs drawn,

## 2022-03-22 NOTE — ED Provider Notes (Signed)
[facility] EMERGENCY DEPARTMENT Provider Note   CSN: [id number] Arrival date & time: 03/22/22  4132     History  Chief Complaint  Patient presents with   Cough    Destiny Velasquez is a 88 y.o. female.  HPI Patient has history of carcinoma of the liver with resection in 2016.  She did not have recurrence or metastatic disease until February 2023.  CT scan identified new mass at prior hepatic resection site and lymph nodes consistent with metastatic disease.  Patient has been undergoing radiation therapy.  She has responded with some decrease in size of epicardial lymph node and pulmonary nodule.  However patient also has been diagnosed with a thickening or small lesion of the esophagus with difficulty swallowing solids and some aspirating.  Patient reports she has been increasingly generally weak.  Patient's daughter reports she was diagnosed with dehydration by her PCP.  Patient also notes that she has had increased difficulty eating and drinking due to her trouble with swallowing.  No fevers.  No vomiting but persistent nausea.  No documented fevers.  Patient has been dealing fairly persistently with dizziness and generalized weakness.    Home Medications Prior to Admission medications   Medication Sig Start Date End Date Taking? Authorizing Provider  acetaminophen (TYLENOL) 325 MG tablet Take 650 mg by mouth every 6 (six) hours as needed (pain).     [provider]  amLODipine (NORVASC) 5 MG tablet Take by mouth. 09/19/21   [provider]  bisacodyl (DULCOLAX) 5 MG EC tablet Take 5 mg by mouth once. Took 4 times for bowel prep    [provider]  Cranberry 200 MG CAPS Take 200 mg by mouth daily.    [provider]  guaiFENesin (MUCINEX) 600 MG 12 hr tablet Take by mouth. 09/25/21   [provider]  hydrocortisone 1 % ointment Apply topically. 03/23/21   [provider]  ibuprofen (ADVIL,MOTRIN) 400 MG tablet Take 400 mg by mouth  every 6 (six) hours as needed.    [provider]  loratadine (CLARITIN) 10 MG tablet Take by mouth. 12/26/16   [provider]  nitrofurantoin (MACRODANTIN) 100 MG capsule Take by mouth. 05/04/21   [provider]  omeprazole (PRILOSEC) 20 MG capsule Take 20 mg by mouth daily.    [provider]  ondansetron (ZOFRAN) 8 MG tablet Take 1 tablet (8 mg total) by mouth every 8 (eight) hours as needed for nausea or vomiting. 11/13/21   Hayden Pedro, PA-C  polyethylene glycol [facility] / Floria Raveling) packet Take 17 g by mouth daily.    [provider]  simethicone (MYLICON) 440 MG chewable tablet Chew 125 mg by mouth 3 (three) times daily before meals.    [provider]  Sod Fluoride-Potassium Nitrate (PREVIDENT 5000 ENAMEL PROTECT) 1.1-5 % GEL  12/02/19   [provider]  Vitamin D, Ergocalciferol, (DRISDOL) 50000 units CAPS capsule Take 50,000 Units by mouth every 7 (seven) days.    [provider]      Allergies    Levaquin [levofloxacin]    Review of Systems   Review of Systems 10 systems reviewed negative except as per HPI Physical Exam Updated Vital Signs BP 134/63 (BP Location: Left Arm)   Pulse 88   Temp 98.4 F (36.9 C) (Oral)   Resp 18   Ht 5\' 3"  (1.6 m)   Wt 61.7 kg   SpO2 95%   BMI 24.10 kg/m  Physical Exam Constitutional:  Comments: Patient is alert.  Clear mental status.  Good physical condition for age.  No respiratory distress at rest.  HENT:     Head: Normocephalic and atraumatic.     Mouth/Throat:     Comments: Airway is clear.  Posterior airway is symmetric without any apparent mass or distortion.  Tongue slightly dry.  Whitish-yellow plaque on the tongue. Eyes:     Extraocular Movements: Extraocular movements intact.  Cardiovascular:     Rate and Rhythm: Normal rate and regular rhythm.  Pulmonary:     Comments: No respiratory distress at rest.  Breath sounds are diminished from the  right midlung field to the base.  No gross wheeze or rhonchi Abdominal:     General: There is no distension.     Palpations: Abdomen is soft.     Tenderness: There is no abdominal tenderness. There is no guarding.  Musculoskeletal:        General: Normal range of motion.     Comments: Patient does not have any calf tenderness.  No significant peripheral edema.  Skin:    General: Skin is warm and dry.     Coloration: Skin is pale.  Neurological:     General: No focal deficit present.     Mental Status: She is oriented to person, place, and time.     Comments: No focal deficits.  Patient is alert and interactive.  Cognitive function is good.  Psychiatric:        Mood and Affect: Mood normal.     ED Results / Procedures / Treatments   Labs (all labs ordered are listed, but only abnormal results are displayed) Labs Reviewed  COMPREHENSIVE METABOLIC PANEL - Abnormal; Notable for the following components:      Result Value   Glucose, Bld 109 (*)    Creatinine, Ser 1.56 (*)    Calcium 11.1 (*)    Albumin 3.3 (*)    GFR, Estimated 31 (*)    All other components within normal limits  CBC WITH DIFFERENTIAL/PLATELET - Abnormal; Notable for the following components:   Lymphs Abs 0.6 (*)    All other components within normal limits  RESP PANEL BY RT-PCR (FLU A&B, COVID) ARPGX2  BRAIN NATRIURETIC PEPTIDE  LACTIC ACID, PLASMA  LIPASE, BLOOD  MAGNESIUM  PHOSPHORUS  URINALYSIS, ROUTINE W REFLEX MICROSCOPIC  TROPONIN I (HIGH SENSITIVITY)  TROPONIN I (HIGH SENSITIVITY)    EKG EKG Interpretation  Date/Time:  Thursday March 22 2022 08:47:01 EDT Ventricular Rate:  82 PR Interval:  187 QRS Duration: 85 QT Interval:  374 QTC Calculation: 437 R Axis:   -21 Text Interpretation: Sinus rhythm Atrial premature complexes in couplets no sig change from previous Confirmed by Charlesetta Shanks [phone]) on 03/22/2022 10:05:43 AM  Radiology CT CHEST ABDOMEN PELVIS WO CONTRAST  Result Date:  03/22/2022 CLINICAL DATA:  Worsening shortness of breath and dysphagia. History of metastatic non-small cell lung cancer with partial hepatectomy. Metastatic disease evaluation. * Tracking Code: BO * EXAM: CT CHEST, ABDOMEN AND PELVIS WITHOUT CONTRAST TECHNIQUE: Multidetector CT imaging of the chest, abdomen and pelvis was performed following the standard protocol without IV contrast. RADIATION DOSE REDUCTION: This exam was performed according to the departmental dose-optimization program which includes automated exposure control, adjustment of the mA and/or kV according to patient size and/or use of iterative reconstruction technique. COMPARISON:  Prior CTs 12/12/2021 and 10/05/2021. FINDINGS: CT CHEST FINDINGS Cardiovascular: No acute vascular findings are identified on noncontrast imaging. There is diffuse atherosclerosis of the  aorta, great vessels and coronary arteries. There are calcifications of the aortic valve. A small amount of pericardial fluid is similar to the previous study. The heart size is normal. Mediastinum/Nodes: There are no enlarged mediastinal, hilar or axillary lymph nodes. Hilar assessment is limited by the lack of intravenous contrast, although the hilar contours appear unchanged. Previously demonstrated right pericardiac node has decreased in size, measuring 6 mm on image 47/3. The thyroid gland, trachea and esophagus demonstrate no significant findings. Lungs/Pleura: As seen on earlier chest radiographs, there is an enlarging right pleural effusion which is large. The right hemidiaphragm is inverted. No definite pleural base nodularity identified on noncontrast imaging. There is resulting complete right lower lobe collapse with partial dependent atelectasis in the right upper and middle lobes. There are multiple new ill-defined pulmonary nodules bilaterally, some of which are cavitary, suspicious for pulmonary metastases. Representative nodules include a 4 mm right upper lobe nodule  (image 64/5), a 7 mm right upper lobe nodule (image 66/5) and a 7 mm left lower lobe nodule (image 124/5. Musculoskeletal/Chest wall: No chest wall mass or suspicious osseous findings. Chronic deformity of the right 9th rib laterally, likely related to previous thoracotomy. Right mastectomy. CT ABDOMEN AND PELVIS FINDINGS Hepatobiliary: Status post partial hepatectomy. Enlarging soft tissue mass adjacent to the resection site, measuring approximately 5.1 x 2.6 cm on image 59/3. This likely involves the right hemidiaphragm. No other definite abnormality of the remaining liver identified on noncontrast imaging. Stable chronic extrahepatic biliary dilatation status post cholecystectomy. Pancreas: Chronically atrophied. No focal abnormality or surrounding inflammation. Spleen: Scattered calcified granulomas.  No focal abnormality. Adrenals/Urinary Tract: Mild nonspecific low-density prominence of both adrenal glands without focal nodularity. Nonobstructing renal calculi bilaterally. No evidence of hydronephrosis or ureteral calculus. The bladder appears normal for its degree of distention. Stomach/Bowel: No enteric contrast administered. The stomach appears unremarkable for its degree of distension. No evidence of bowel wall thickening, distention or surrounding inflammatory change. Vascular/Lymphatic: There are no enlarged abdominal or pelvic lymph nodes. Aortic and branch vessel atherosclerosis without evidence of aneurysm. Reproductive: Hysterectomy. No change in 4.2 cm low-density left adnexal lesion, likely benign. Pelvic floor laxity. Other: No ascites or peritoneal nodularity. Postsurgical changes in the anterior abdominal wall. Musculoskeletal: Chronic anterolisthesis at L4-5 with associated chronic biforaminal narrowing. No acute osseous findings. IMPRESSION: 1. Suspected progressive tumor recurrence along the partial hepatectomy site, contiguous with the right hemidiaphragm. 2. Significant enlargement of a  right pleural effusion with resulting inversion of the right hemidiaphragm. Although no definite pleural nodularity is identified on noncontrast imaging, this may reflect a malignant effusion. It certainly may contribute to the patient's shortness of breath. Consider thoracentesis. 3. Scattered new small pulmonary nodules bilaterally, suspicious for metastatic disease. 4. Nonobstructing bilateral renal calculi. Coronary and Aortic Atherosclerosis (ICD10-I70.0). Additional incidental findings as detailed above. Electronically Signed   By: Richardean Sale M.D.   On: 03/22/2022 10:41   CT Head Wo Contrast  Result Date: 03/22/2022 CLINICAL DATA:  Provided history: Brain metastases suspected. EXAM: CT HEAD WITHOUT CONTRAST TECHNIQUE: Contiguous axial images were obtained from the base of the skull through the vertex without intravenous contrast. RADIATION DOSE REDUCTION: This exam was performed according to the departmental dose-optimization program which includes automated exposure control, adjustment of the mA and/or kV according to patient size and/or use of iterative reconstruction technique. COMPARISON:  Brain MRI 06/22/2015.  Head CT 06/16/2015. FINDINGS: Brain: Moderate cerebral atrophy. Comparatively mild cerebellar atrophy. Moderate patchy and ill-defined hypoattenuation within the cerebral white  matter, nonspecific but compatible chronic small vessel ischemic disease. There is no acute intracranial hemorrhage. No demarcated cortical infarct. No extra-axial fluid collection. No evidence of an intracranial mass. No midline shift. Vascular: No hyperdense vessel. Atherosclerotic calcifications. Skull: No fracture or aggressive osseous lesion. Sinuses/Orbits: No mass or acute finding within the imaged orbits. Minimal mucosal thickening within anterior right ethmoid air cells. Opacification of posterior ethmoid air cells, bilaterally. Opacification of the left sphenoid sinus. IMPRESSION: No evidence of acute  intracranial abnormality. No evidence of intracranial metastatic disease, although a non-contrast head CT has limited sensitivity. Consider a brain MRI without and with contrast for further evaluation. Moderate chronic small vessel ischemic changes within the cerebral white matter. Moderate cerebral atrophy. Comparatively mild cerebellar atrophy. Paranasal sinus disease at the imaged levels, as described. Electronically Signed   By: Kellie Simmering D.O.   On: 03/22/2022 10:29   DG Chest Portable 1 View  Result Date: 03/22/2022 CLINICAL DATA:  Shortness of breath and cough after drinking water. Recently diagnosed with liver cancer. EXAM: PORTABLE CHEST 1 VIEW COMPARISON:  02/12/2016 chest x-ray and CT of the chest on 01/01/2022 FINDINGS: There is new, significant opacity in the RIGHT hemithorax. A portion of this represents pleural effusion. The remainder of the opacity is consistent with atelectasis or consolidation. LEFT lung is clear. No pulmonary edema. No pneumothorax. IMPRESSION: Significant increase in opacity of the RIGHT hemithorax, portion of which is pleural effusion. The remainder of the opacity could be related to atelectasis, or consolidation. Electronically Signed   By: Nolon Nations M.D.   On: 03/22/2022 09:19    Procedures Procedures    Medications Ordered in ED Medications  lactated ringers infusion ( Intravenous New Bag/Given 03/22/22 0957)    ED Course/ Medical Decision Making/ A&P                           Medical Decision Making Amount and/or Complexity of Data Reviewed Labs: ordered. Radiology: ordered.  Risk Prescription drug management. Decision regarding hospitalization.   Patient presents as outlined.  She has known significant underlying condition of metastatic liver cancer with involvement of lungs and esophagus.  Clinically patient has clear mental status and does not exhibit respiratory distress.  Review of systems in HPI raise concern for worsening lung  involvement and esophageal dysphagia.  Physical exam has significantly decreased breath sound on the right.  Will proceed with broad diagnostic evaluation with lab work and chest x-ray.  Anticipate CT scan of head and chest.  With dizziness and nausea concern for possible metastatic brain involvement.  Diagnostic labs reviewed by myself.  Patient chemistry panel and CBC within normal limits.  Stable compared to previous.  CT chest visually reviewed by myself.  Patient has very large pleural effusion on the right.  Obscures large portion of the right lung.  Radiology interpretation reviewed.  At this time with very large right pleural effusion plan will be for admission for definitive management.  I do not think patient will tolerate the effusion on outpatient basis with delayed management.  Consult: Reviewed with Dr. Olevia Bowens for admission        Final Clinical Impression(s) / ED Diagnoses Final diagnoses:  Malignant pleural effusion    Rx / DC Orders ED Discharge Orders     None         Charlesetta Shanks, MD 03/22/22 1224

## 2022-03-22 NOTE — ED Notes (Signed)
Dr Johnney Killian into speak to pt and family

## 2022-03-22 NOTE — Assessment & Plan Note (Addendum)
   Notable mild hypercalcemia on initial chemistry  Considering how mild hypercalcemia is this is more likely related to volume depletion or possibly excess consumption of vitamin D and less likely related to malignancy although it is still possible  Holding vitamin D for now as patient is on an extremely high dose regimen in the outpatient setting.  Will start with gentle hydration overnight and reassess calcium levels in the morning as well as obtaining an ionized calcium

## 2022-03-22 NOTE — ED Notes (Addendum)
Pt states is tired of of being in bed and chair , assisted up to walk around dept and outside to get warm some ,pulse ox at 90  percent , pt using walker , up to BR also , given warm blankets and drink, daughter with pt

## 2022-03-22 NOTE — Assessment & Plan Note (Signed)
.   Resume home regimen of Synthroid 

## 2022-03-22 NOTE — Assessment & Plan Note (Signed)
   Documented history of hyperlipidemia  Unable to obtain an accurate medication list and patient is unaware of what she takes.  Pharmacy is attempting to coordinate this with the patient's assisted living facility.  Once we are able to obtain an accurate list we will resume lipid-lowering therapy if present

## 2022-03-22 NOTE — Assessment & Plan Note (Addendum)
   Originally diagnosed in 2016 status post liver resection  Recurrence in 2018 status post radiation therapy  Another recurrence noted 09/2021 with heterogenously enhancing hepatic mass as well as enlarged epicardial lymph node and pulmonary nodule status post stereotactic radiosurgery 11/08/2021-11/21/2021 followed by radiation therapy  Follows with Dr. Lindi Adie with Oncology  I have placed Dr. Lindi Adie on the treatment team to follow along, his input is appreciated especially considering the likely presence of progressive metastatic disease based on review of the CT results.

## 2022-03-23 ENCOUNTER — Observation Stay (HOSPITAL_COMMUNITY): Payer: Medicare Other

## 2022-03-23 ENCOUNTER — Inpatient Hospital Stay (HOSPITAL_COMMUNITY): Payer: Medicare Other

## 2022-03-23 ENCOUNTER — Other Ambulatory Visit: Payer: Self-pay | Admitting: *Deleted

## 2022-03-23 DIAGNOSIS — Z853 Personal history of malignant neoplasm of breast: Secondary | ICD-10-CM | POA: Diagnosis not present

## 2022-03-23 DIAGNOSIS — E782 Mixed hyperlipidemia: Secondary | ICD-10-CM | POA: Diagnosis present

## 2022-03-23 DIAGNOSIS — Z923 Personal history of irradiation: Secondary | ICD-10-CM | POA: Diagnosis not present

## 2022-03-23 DIAGNOSIS — Z515 Encounter for palliative care: Secondary | ICD-10-CM | POA: Diagnosis not present

## 2022-03-23 DIAGNOSIS — J910 Malignant pleural effusion: Secondary | ICD-10-CM | POA: Diagnosis present

## 2022-03-23 DIAGNOSIS — Z90710 Acquired absence of both cervix and uterus: Secondary | ICD-10-CM | POA: Diagnosis not present

## 2022-03-23 DIAGNOSIS — R1310 Dysphagia, unspecified: Secondary | ICD-10-CM | POA: Diagnosis present

## 2022-03-23 DIAGNOSIS — N1832 Chronic kidney disease, stage 3b: Secondary | ICD-10-CM | POA: Diagnosis present

## 2022-03-23 DIAGNOSIS — Z79899 Other long term (current) drug therapy: Secondary | ICD-10-CM | POA: Diagnosis not present

## 2022-03-23 DIAGNOSIS — J90 Pleural effusion, not elsewhere classified: Secondary | ICD-10-CM | POA: Diagnosis not present

## 2022-03-23 DIAGNOSIS — R918 Other nonspecific abnormal finding of lung field: Secondary | ICD-10-CM | POA: Diagnosis not present

## 2022-03-23 DIAGNOSIS — K219 Gastro-esophageal reflux disease without esophagitis: Secondary | ICD-10-CM | POA: Diagnosis present

## 2022-03-23 DIAGNOSIS — I129 Hypertensive chronic kidney disease with stage 1 through stage 4 chronic kidney disease, or unspecified chronic kidney disease: Secondary | ICD-10-CM | POA: Diagnosis present

## 2022-03-23 DIAGNOSIS — Z9011 Acquired absence of right breast and nipple: Secondary | ICD-10-CM | POA: Diagnosis not present

## 2022-03-23 DIAGNOSIS — C7801 Secondary malignant neoplasm of right lung: Secondary | ICD-10-CM | POA: Diagnosis present

## 2022-03-23 DIAGNOSIS — Z809 Family history of malignant neoplasm, unspecified: Secondary | ICD-10-CM | POA: Diagnosis not present

## 2022-03-23 DIAGNOSIS — Z8673 Personal history of transient ischemic attack (TIA), and cerebral infarction without residual deficits: Secondary | ICD-10-CM | POA: Diagnosis not present

## 2022-03-23 DIAGNOSIS — E039 Hypothyroidism, unspecified: Secondary | ICD-10-CM | POA: Diagnosis present

## 2022-03-23 DIAGNOSIS — Z9049 Acquired absence of other specified parts of digestive tract: Secondary | ICD-10-CM | POA: Diagnosis not present

## 2022-03-23 DIAGNOSIS — C801 Malignant (primary) neoplasm, unspecified: Secondary | ICD-10-CM | POA: Diagnosis not present

## 2022-03-23 DIAGNOSIS — Z8249 Family history of ischemic heart disease and other diseases of the circulatory system: Secondary | ICD-10-CM | POA: Diagnosis not present

## 2022-03-23 DIAGNOSIS — Z87891 Personal history of nicotine dependence: Secondary | ICD-10-CM | POA: Diagnosis not present

## 2022-03-23 DIAGNOSIS — Z20822 Contact with and (suspected) exposure to covid-19: Secondary | ICD-10-CM | POA: Diagnosis present

## 2022-03-23 DIAGNOSIS — C787 Secondary malignant neoplasm of liver and intrahepatic bile duct: Secondary | ICD-10-CM | POA: Diagnosis present

## 2022-03-23 DIAGNOSIS — C7802 Secondary malignant neoplasm of left lung: Secondary | ICD-10-CM | POA: Diagnosis present

## 2022-03-23 DIAGNOSIS — Z66 Do not resuscitate: Secondary | ICD-10-CM | POA: Diagnosis present

## 2022-03-23 LAB — CBC WITH DIFFERENTIAL/PLATELET
Abs Immature Granulocytes: 0.03 10*3/uL (ref 0.00–0.07)
Basophils Absolute: 0.1 10*3/uL (ref 0.0–0.1)
Basophils Relative: 1 %
Eosinophils Absolute: 0.3 10*3/uL (ref 0.0–0.5)
Eosinophils Relative: 4 %
HCT: 40.2 % (ref 36.0–46.0)
Hemoglobin: 13.2 g/dL (ref 12.0–15.0)
Immature Granulocytes: 0 %
Lymphocytes Relative: 9 %
Lymphs Abs: 0.7 10*3/uL (ref 0.7–4.0)
MCH: 29 pg (ref 26.0–34.0)
MCHC: 32.8 g/dL (ref 30.0–36.0)
MCV: 88.4 fL (ref 80.0–100.0)
Monocytes Absolute: 0.7 10*3/uL (ref 0.1–1.0)
Monocytes Relative: 9 %
Neutro Abs: 6.5 10*3/uL (ref 1.7–7.7)
Neutrophils Relative %: 77 %
Platelets: 201 10*3/uL (ref 150–400)
RBC: 4.55 MIL/uL (ref 3.87–5.11)
RDW: 13.3 % (ref 11.5–15.5)
WBC: 8.4 10*3/uL (ref 4.0–10.5)
nRBC: 0 % (ref 0.0–0.2)

## 2022-03-23 LAB — COMPREHENSIVE METABOLIC PANEL
ALT: 11 U/L (ref 0–44)
AST: 12 U/L — ABNORMAL LOW (ref 15–41)
Albumin: 2.8 g/dL — ABNORMAL LOW (ref 3.5–5.0)
Alkaline Phosphatase: 59 U/L (ref 38–126)
Anion gap: 8 (ref 5–15)
BUN: 15 mg/dL (ref 8–23)
CO2: 29 mmol/L (ref 22–32)
Calcium: 10.5 mg/dL — ABNORMAL HIGH (ref 8.9–10.3)
Chloride: 104 mmol/L (ref 98–111)
Creatinine, Ser: 1.61 mg/dL — ABNORMAL HIGH (ref 0.44–1.00)
GFR, Estimated: 29 mL/min — ABNORMAL LOW (ref >60)
Glucose, Bld: 102 mg/dL — ABNORMAL HIGH (ref 70–99)
Potassium: 3.9 mmol/L (ref 3.5–5.1)
Sodium: 141 mmol/L (ref 135–145)
Total Bilirubin: 0.6 mg/dL (ref 0.3–1.2)
Total Protein: 6.2 g/dL — ABNORMAL LOW (ref 6.5–8.1)

## 2022-03-23 LAB — BODY FLUID CELL COUNT WITH DIFFERENTIAL
Eos, Fluid: 3 %
Lymphs, Fluid: 12 %
Monocyte-Macrophage-Serous Fluid: 78 % (ref 50–90)
Neutrophil Count, Fluid: 7 % (ref 0–25)
Total Nucleated Cell Count, Fluid: 467 cu mm (ref 0–1000)

## 2022-03-23 LAB — GLUCOSE, PLEURAL OR PERITONEAL FLUID: Glucose, Fluid: 100 mg/dL

## 2022-03-23 LAB — PROTIME-INR
INR: 1.1 (ref 0.8–1.2)
Prothrombin Time: 14.5 seconds (ref 11.4–15.2)

## 2022-03-23 LAB — APTT: aPTT: 29 seconds (ref 24–36)

## 2022-03-23 LAB — PROTEIN, PLEURAL OR PERITONEAL FLUID: Total protein, fluid: 4.2 g/dL

## 2022-03-23 LAB — LACTATE DEHYDROGENASE, PLEURAL OR PERITONEAL FLUID: LD, Fluid: 169 U/L — ABNORMAL HIGH (ref 3–23)

## 2022-03-23 LAB — MAGNESIUM: Magnesium: 2.2 mg/dL (ref 1.7–2.4)

## 2022-03-23 LAB — TSH: TSH: 3.173 u[IU]/mL (ref 0.350–4.500)

## 2022-03-23 MED ORDER — ADULT MULTIVITAMIN W/MINERALS CH
1.0000 | ORAL_TABLET | Freq: Every day | ORAL | Status: DC
  Administered 2022-03-23 – 2022-03-24 (×2): 1 via ORAL
  Filled 2022-03-23 (×2): qty 1

## 2022-03-23 MED ORDER — ENSURE ENLIVE PO LIQD
237.0000 mL | Freq: Three times a day (TID) | ORAL | Status: DC
  Administered 2022-03-23: 237 mL via ORAL

## 2022-03-23 MED ORDER — ALPRAZOLAM 0.5 MG PO TABS
0.5000 mg | ORAL_TABLET | Freq: Once | ORAL | Status: AC
  Administered 2022-03-23: 0.5 mg via ORAL
  Filled 2022-03-23 (×2): qty 1

## 2022-03-23 MED ORDER — LIDOCAINE HCL 1 % IJ SOLN
INTRAMUSCULAR | Status: AC
  Filled 2022-03-23: qty 20

## 2022-03-23 MED ORDER — PANTOPRAZOLE SODIUM 40 MG PO TBEC
40.0000 mg | DELAYED_RELEASE_TABLET | Freq: Two times a day (BID) | ORAL | Status: DC
  Administered 2022-03-23 – 2022-03-24 (×2): 40 mg via ORAL
  Filled 2022-03-23 (×2): qty 1

## 2022-03-23 NOTE — Progress Notes (Signed)
Per MD request, RN placed referral to [facility].  Provided information to Hospice that pt will be discharged from the [facility] 03/24/22.  Provided Hospice with pts Daughter [name]'s contact information.

## 2022-03-23 NOTE — Progress Notes (Signed)
SLP Cancellation Note  Patient Details Name: Destiny Velasquez MRN: [id number] DOB: [birth year]   Cancelled treatment:       Reason Eval/Treat Not Completed: Medical issues which prohibited therapy  Unable to complete BSE at this time, as pt is leaving for [place] now, and is also NPO for a procedure later today. Will continue efforts. RN aware.  Kimyata Milich B. Quentin Ore, [facility], [place] Speech Language Pathologist Office: [phone]  Shonna Chock 03/23/2022, 10:55 AM

## 2022-03-23 NOTE — Procedures (Signed)
PROCEDURE SUMMARY:  Successful US guided diagnostic and therapeutic right thoracentesis. Yielded 1.8 L of clear, amber fluid. Pt tolerated procedure well. No immediate complications.  Specimen was sent for labs. CXR ordered.  EBL < 1 mL  Tyson Alias, AGNP 03/23/2022 12:05 PM

## 2022-03-23 NOTE — Evaluation (Signed)
Clinical/Bedside Swallow Evaluation Patient Details  Name: Destiny Velasquez MRN: [id number] Date of Birth: 1926/04/14  Today's Date: 03/23/2022 Time: SLP Start Time (ACUTE ONLY): 72 SLP Stop Time (ACUTE ONLY): 1330 SLP Time Calculation (min) (ACUTE ONLY): 30 min  Past Medical History:  Past Medical History:  Diagnosis Date   Anal and rectal polyp    Breast cancer, right breast ([facility]) 1971   "never had chemo or radiation"   Depression    Disorder of bone and cartilage, unspecified    Diverticulitis of large intestine without perforation or abscess without bleeding    Diverticulosis of colon (without mention of hemorrhage)    Essential and other specified forms of tremor    Family history of adverse reaction to anesthesia    daughter and grandson are slow to wake up   Frequent UTI    GERD (gastroesophageal reflux disease)    Hyperlipidemia    Hypertension    Liver cancer ([facility]) 2016   Malignant neoplasm of breast (female), unspecified site    Pneumonia X 1   Stroke [facility])    after liver surgery - in 2016 no lasting weakness. does walk with walker   Subacute delirium    Thyroid disease    Unspecified constipation    Unspecified hypothyroidism    Past Surgical History:  Past Surgical History:  Procedure Laterality Date   ABDOMINAL HYSTERECTOMY     APPENDECTOMY     BREAST BIOPSY Right 1971   CATARACT EXTRACTION W/ INTRAOCULAR LENS  IMPLANT, BILATERAL Bilateral    CHOLECYSTECTOMY     CYSTOSCOPY W/ URETERAL STENT PLACEMENT Bilateral 02/09/2016   Procedure: CYSTOSCOPY WITH RETROGRADE PYELOGRAM/ BILATERAL TEMPORARY INDWELLING URETERAL STENT PLACEMENT;  Surgeon: Ardis Hughs, MD;  Location: [facility];  Service: Urology;  Laterality: Bilateral;   LAPAROSCOPY N/A 06/14/2015   Procedure: LAPAROSCOPY DIAGNOSTIC;  Surgeon: [name], MD;  Location: [facility];  Service: General;  Laterality: N/A;   LIVER BIOPSY  06/14/2015   Procedure: LIVER BIOPSY;  Surgeon: [name], MD;  Location: [facility];  Service: General;;   MASTECTOMY Right 1971   OPEN PARTIAL HEPATECTOMY  N/A 06/14/2015   Procedure:  OPEN PARTIAL RIGHT HEPATECTOMY;  Surgeon: [name], MD;  Location: [facility];  Service: General;  Laterality: N/A;  converted to open @ 1447   PARTIAL COLECTOMY N/A 02/09/2016   Procedure: OPEN SIGMOID  COLECTOMY WITH REPAIR COLOVESICAL AND COLOVAGINAL FISTULA;  Surgeon: [name], MD;  Location: [facility];  Service: General;  Laterality: N/A;   RECTAL POLYPECTOMY  early 2000's   TONSILLECTOMY AND ADENOIDECTOMY  1935   TUBAL LIGATION     HPI:  88yo female admitted 03/22/22 with cough, fatigue x10 days. PMH: liver adenocarcinoma (dx 2016), breast cancer, sigmoid diverticulitis s/p sigmoidectomy, HLD, HTN, CKD3b, GERD, recurrent UTI, CVA after liver surgery 2016, transient dysphagia. Esophagram 12/06/21 = no pen/asp, Zenker's diverticulum, marked esophageal dysmotility, severe presbyesophagus    Assessment / Plan / Recommendation  Clinical Impression  Pt seen at bedside for assessment of swallow function and safety. Pt was seated upright in bed, awake and alert. Daughter present.  Chart review indicates pt has a history of esophageal dysmotility/dysphagia. Pt presents with adequate natural dentition, missing only 1. CN exam is unremarkable. Pt tolerated trials of thin liquid, puree, and solid textures without obvious oral issues or overt s/s aspiration. SLP reviewed and provided written behavioral and dietary strategies for management of esophageal dysmotility. Recommend regular diet with thin liquids, meds as tolerated. No ST  follow up recommended at this time. Please reconsult if needs arise.  SLP Visit Diagnosis: Dysphagia, unspecified (R13.10)    Aspiration Risk  Mild aspiration risk    Diet Recommendation Regular;Thin liquid   Liquid Administration via: Cup;Straw Medication Administration: Whole meds with liquid Supervision: Patient able to self feed Compensations: Slow rate;Small  sips/bites Postural Changes: Seated upright at 90 degrees;Remain upright for at least 30 minutes after po intake    Other  Recommendations Oral Care Recommendations: Oral care BID    Recommendations for follow up therapy are one component of a multi-disciplinary discharge planning process, led by the attending physician.  Recommendations may be updated based on patient status, additional functional criteria and insurance authorization.  Follow up Recommendations No SLP follow up      Assistance Recommended at Discharge Intermittent Supervision/Assistance  Functional Status Assessment Patient has not had a recent decline in their functional status      Prognosis Prognosis for Safe Diet Advancement: Good      Swallow Study   General Date of Onset: 03/22/22 HPI: 88yo female admitted 03/22/22 with cough, fatigue x10 days. PMH: liver adenocarcinoma (dx 2016), breast cancer, sigmoid diverticulitis s/p sigmoidectomy, HLD, HTN, CKD3b, GERD, recurrent UTI, CVA after liver surgery 2016, transient dysphagia. Esophagram 12/06/21 = no pen/asp, Zenker's diverticulum, marked esophageal dysmotility, severe presbyesophagus Type of Study: Bedside Swallow Evaluation Previous Swallow Assessment: BSE 06/2015 - reg thin, crushed meds Diet Prior to this Study: Regular;Thin liquids Temperature Spikes Noted: No Respiratory Status: Room air History of Recent Intubation: No Behavior/Cognition: Alert;Cooperative;Pleasant mood Oral Cavity Assessment: Within Functional Limits Oral Care Completed by SLP: No Oral Cavity - Dentition: Adequate natural dentition Vision: Functional for self-feeding Self-Feeding Abilities: Able to feed self Patient Positioning: Upright in bed Baseline Vocal Quality: Normal Volitional Cough: Strong Volitional Swallow: Able to elicit    Oral/Motor/Sensory Function Overall Oral Motor/Sensory Function: Within functional limits   Ice Chips Ice chips: Not tested   Thin Liquid Thin  Liquid: Within functional limits Presentation: Straw    Nectar Thick Nectar Thick Liquid: Not tested   Honey Thick Honey Thick Liquid: Not tested   Puree Puree: Within functional limits Presentation: Spoon;Self Fed   Solid     Solid: Within functional limits Presentation: [place] B. Quentin Ore, [facility], [place] Speech Language Pathologist Office: [phone]  Shonna Chock 03/23/2022,2:13 PM

## 2022-03-23 NOTE — TOC Initial Note (Signed)
Transition of Care [facility]) - Initial/Assessment Note    Patient Details  Name: Destiny Velasquez MRN: [id number] Date of Birth: 1925-12-19  Transition of Care [facility]) CM/SW Contact:    Leeroy Cha, RN Phone Number: 03/23/2022, 8:05 AM  Clinical Narrative:                  Transition of Care [facility]) Screening Note   Patient Details  Name: Destiny Velasquez Date of Birth: Sep 12, 1925   Transition of Care [facility]) CM/SW Contact:    Leeroy Cha, RN Phone Number: 03/23/2022, 8:05 AM    Transition of Care Department [facility]) has reviewed patient and no TOC needs have been identified at this time. We will continue to monitor patient advancement through interdisciplinary progression rounds. If new patient transition needs arise, please place a TOC consult.    Expected Discharge Plan: Home/Self Care Barriers to Discharge: Continued Medical Work up   Patient Goals and CMS Choice Patient states their goals for this hospitalization and ongoing recovery are:: to go back home and be well CMS Medicare.gov Compare Post Acute Care list provided to:: Patient Choice offered to / list presented to : Patient  Expected Discharge Plan and Services Expected Discharge Plan: Home/Self Care   Discharge Planning Services: CM Consult   Living arrangements for the past 2 months: Single Family Home                                      Prior Living Arrangements/Services Living arrangements for the past 2 months: Single Family Home Lives with:: Self (widowed) Patient language and need for interpreter reviewed:: Yes Do you feel safe going back to the place where you live?: Yes            Criminal Activity/Legal Involvement Pertinent to Current Situation/Hospitalization: No - Comment as needed  Activities of Daily Living Home Assistive Devices/Equipment: Environmental consultant (specify type), Eyeglasses ADL Screening (condition at time of admission) Patient's cognitive ability adequate to safely complete daily  activities?: Yes Is the patient deaf or have difficulty hearing?: No Does the patient have difficulty seeing, even when wearing glasses/contacts?: No Does the patient have difficulty concentrating, remembering, or making decisions?: Yes Patient able to express need for assistance with ADLs?: Yes Does the patient have difficulty dressing or bathing?: Yes Independently performs ADLs?: No Communication: Independent Dressing (OT): Needs assistance Is this a change from baseline?: Change from baseline, expected to last >3 days Grooming: Independent Feeding: Independent Bathing: Needs assistance Is this a change from baseline?: Change from baseline, expected to last >3 days Toileting: Needs assistance Is this a change from baseline?: Pre-admission baseline In/Out Bed: Needs assistance Is this a change from baseline?: Pre-admission baseline Walks in Home: Needs assistance (with walker) Is this a change from baseline?: Pre-admission baseline Does the patient have difficulty walking or climbing stairs?: Yes Weakness of Legs: Both (legs get wobbly at times) Weakness of Arms/Hands: None  Permission Sought/Granted                  Emotional Assessment Appearance:: Appears stated age Attitude/Demeanor/Rapport: Engaged Affect (typically observed): Calm Orientation: : Oriented to Place, Oriented to Self, Oriented to  Time, Oriented to Situation Alcohol / Substance Use: Tobacco Use, Alcohol Use (quit smoking 9 years ago, etoh current use) Psych Involvement: No (comment)  Admission diagnosis:  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) 03/22/2022   Hypercalcemia 03/22/2022   Diverticulitis 02/09/2016   Generalized anxiety disorder 11/15/2015   Loss of weight  10/25/2015   Thyroid activity decreased 10/25/2015   Mild cognitive impairment with memory loss 10/25/2015   Controlled type 2 diabetes mellitus without complication, without long-term current use of insulin ([place]) 10/25/2015   Biloma following surgery    Abdominal abscess    Abscess    Pyelonephritis 07/02/2015   Dysphagia 07/02/2015   Leukocytosis 07/02/2015   Acute-on-chronic kidney injury ([place]) 07/02/2015   Hyponatremia 07/02/2015   Urinary tract infection, site not specified 07/02/2015   Subacute delirium    GERD without esophagitis    Malignant neoplasm metastatic to liver ([place]) 06/14/2015   At risk for colon cancer 05/17/2015   Unintentional weight loss 05/06/2015   Hypothyroidism 05/03/2015   Essential hypertension 05/03/2015   Chronic constipation 04/15/2014   Arthralgia of right knee 01/26/2013   Arthralgia of right lower leg 01/26/2013   Mixed hyperlipidemia    Thyroid disease    Memory loss    Disorder of bone and cartilage, unspecified    PCP:  Wynelle Fanny, DO Pharmacy:  No Pharmacies Listed    Social Determinants of Health (SDOH) Interventions    Readmission Risk Interventions     No data to display

## 2022-03-23 NOTE — Progress Notes (Signed)
HEMATOLOGY-ONCOLOGY PROGRESS NOTE  SUBJECTIVE: Patient is admitted with shortness of breath and was found to have right-sided pleural effusion.  Status post thoracentesis yielded 1.8 L.  Oncology History  Malignant neoplasm metastatic to liver ([place])  05/03/2015 Imaging   CT abdomen: Bilobed 8.1 cm mass in the liver segment 5. 3.8 cm fluid collection posterior and medial: Favoring adnexal cyst    05/04/2015 Initial Diagnosis   Poorly differentiated carcinoma strongly positive for CK 8/18, CK 7 and MOC-31; HPAR faint cytoplasmic staining; (negative for ER, PR, AFP, TTF-1, WT 1, p63, CD10, CDX 2, CK 20, CK 5/6, mucicarmine)    05/17/2015 PET scan   Hypermetabolic partially necrotic right liver lobe mass , hypermetabolic some within the descending/sigmoid colon junction in the region of inflammation/  diverticulitis versus cancer    06/14/2015 Surgery   liver partial resection segment 7 and 8: Poorly differentiated carcinoma 8.6 cm, margins negative,positive for ck8/18, ck 7, partially positive for ck20, polyclonalCEA, cd10 and negative for HEPAR, AFP, Monoclonal CEA, WT-1, cdx-2, ER, PR, GCDFP and TTF   06/14/2015 Procedure   cancer type ID: 96% pancreaticobiliary origin, 60% cholangiocarcinoma, 36% gallbladder adenocarcinoma   02/09/2016 Surgery   Sigmoid colectomy for recurrent diverticulitis with chronic stricture; no malignancy 4 lymph nodes negative   11/28/2016 Relapse/Recurrence   New right hepatic lobe lesion worrisome for recurrent tumor 4 cm   01/01/2017 - 01/09/2017 Radiation Therapy   SBRT to liver   03/18/2017 Imaging   Hypodense liver lesion smaller (was 3.1 cm, now 2.8 cm), No other new metastatic lesions noted.    09/15/2018 Imaging   CT abdomen: Continued decrease in the size of the posterior right lobe liver mass measuring 1.5 x 1.2 x 0.9 cm, previously it was 1.7 x 1.5 x 1.7 cm      OBJECTIVE: REVIEW OF SYSTEMS:   Marked improvement in the shortness of breath after  thoracentesis.     PHYSICAL EXAMINATION: ECOG PERFORMANCE STATUS: 1 - Symptomatic but completely ambulatory  Vitals:   03/23/22 1152 03/23/22 1224  BP: (!) 127/58 124/77  Pulse:  76  Resp:  16  Temp:  97.8 F (36.6 C)  SpO2:  95%   Filed Weights   03/22/22 0841 03/22/22 2019  Weight: 136 lb 0.4 oz (61.7 kg) 129 lb 6.6 oz (58.7 kg)     LABORATORY DATA:  I have reviewed the data as listed    Latest Ref Rng & Units 03/23/2022    3:43 AM 03/22/2022    8:40 AM 12/12/2021   12:51 PM  CMP  Glucose 70 - 99 mg/dL 102  109    BUN 8 - 23 mg/dL 15  13    Creatinine 0.44 - 1.00 mg/dL 1.61  1.56  1.40   Sodium 135 - 145 mmol/L 141  138    Potassium 3.5 - 5.1 mmol/L 3.9  3.9    Chloride 98 - 111 mmol/L 104  100    CO2 22 - 32 mmol/L 29  29    Calcium 8.9 - 10.3 mg/dL 10.5  11.1    Total Protein 6.5 - 8.1 g/dL 6.2  7.3    Total Bilirubin 0.3 - 1.2 mg/dL 0.6  0.7    Alkaline Phos 38 - 126 U/L 59  75    AST 15 - 41 U/L 12  16    ALT 0 - 44 U/L 11  13      Lab Results  Component Value Date   WBC 8.4 03/23/2022  HGB 13.2 03/23/2022   HCT 40.2 03/23/2022   MCV 88.4 03/23/2022   PLT 201 03/23/2022   NEUTROABS 6.5 03/23/2022    ASSESSMENT AND PLAN: 1.  Right-sided pleural effusion 2. metastatic carcinoma to the liver  Status post thoracentesis.  Possibly related to right-sided malignancy in the liver with involvement of the right hemidiaphragm. I discussed the results of the CT scans which showed not only the pleural effusion but also lung nodules which are new.  Prognosis: I discussed with her that prognosis is guarded because there is no other treatment options that are available.  She had surgery and radiation previously.  I suspect that she would have less than 6 months survival. I will consult hospice to see her at the nursing home. She wishes to get discharged tomorrow.

## 2022-03-23 NOTE — Progress Notes (Signed)
Initial Nutrition Assessment  DOCUMENTATION CODES:   Not applicable  INTERVENTION:   -Ensure Enlive po TID, each supplement provides 350 kcal and 20 grams of protein -MVI with minerals daily  NUTRITION DIAGNOSIS:   Increased nutrient needs related to chronic illness (liver cancer) as evidenced by estimated needs.  GOAL:   Patient will meet greater than or equal to 90% of their needs  MONITOR:   PO intake, Supplement acceptance  REASON FOR ASSESSMENT:   Malnutrition Screening Tool    ASSESSMENT:   Pt with past medical history of adenocarcinoma of the liver diagnosed in 2016, sigmoid diverticulitis (S/P sigmoidectomy due to colovesical fistula), hyperlipidemia, hypertension, chronic kidney disease stage IIIb, gastroesophageal reflux disease presenting with complaints of cough.  Pt admitted with rt pleural effusion.   8/11- s/p rt thoracentesis (1.8 L removed)  Pt unavailable at time of visit. Attempted to speak with pt via call to hospital room phone, however, unable to reach. RD unable to obtain further nutrition-related history or complete nutrition-focused physical exam at this time.    Pt just advanced to a regular diet. No meal completion data available to assess at this time.   Reviewed wt hx; pt has experienced a 16% wt loss over the past 6 months, which is significant for time frame.   Per oncology notes, rt sided pleural effusion likely related to rt-sided malignancy in the liver with involvement in the hemidiaphragm as well as lung nodules from CT scan; prognosis is guarded due to no available treatment options. Plan for hospice to follow at SNF.   Medications reviewed and include colace, magnesium oxide, vitamin B3, and lactated ringers infusion @ 75 ml/hr.   Labs reviewed: CBGS: 159.   Diet Order:   Diet Order             Diet regular Room service appropriate? Yes; Fluid consistency: Thin  Diet effective now                   EDUCATION NEEDS:    Education needs have been addressed  Skin:  Skin Assessment: Reviewed RN Assessment  Last BM:  03/23/22  Height:   Ht Readings from Last 1 Encounters:  03/22/22 5\' 3"  (1.6 m)    Weight:   Wt Readings from Last 1 Encounters:  03/22/22 58.7 kg    Ideal Body Weight:  52.3 kg  BMI:  Body mass index is 22.92 kg/m.  Estimated Nutritional Needs:   Kcal:  1600-1800  Protein:  85-100 grams  Fluid:  > 1.6 L    Loistine Chance, RD, LDN, [place] Registered Dietitian II Certified Diabetes Care and Education Specialist Please refer to [facility] for RD and/or RD on-call/weekend/after hours pager

## 2022-03-23 NOTE — Progress Notes (Addendum)
PROGRESS NOTE    Destiny Velasquez  ZDG:[id number] DOB: Dec 22, 1925 DOA: 03/22/2022  PCP: Wynelle Fanny, DO    Brief Narrative:  This 88 year old female with PMH  significant of , adenocarcinoma of the liver diagnosed in 2016, sigmoid diverticulitis (S/P sigmoidectomy due to colovesical fistula), hyperlipidemia, hypertension, chronic kidney disease stage IIIb, gastroesophageal reflux disease presented in the ED with complaints of progressively worsening cough, fatigue and shortness of breath on exertion as well as at rest.  Upon evaluation in the ED she was found to have large right-sided pleural effusion as well as enlarging soft tissue mass at the hepatic resection site as well as small bilateral pulmonary nodules concerning for progressive metastatic disease. Patient is admitted for recurrent pleural effusion.  IR consulted,  Patient underwent successful thoracocentesis.  Oncology Dr. Payton Mccallum is consulted.  He said patient has poor prognosis and recommended hospice.  Assessment & Plan:   Principal Problem:   Pleural effusion on right Active Problems:   Malignant neoplasm metastatic to liver (HCC)   Hypercalcemia   Chronic kidney disease, stage 3b (HCC)   Essential hypertension   GERD without esophagitis   Dysphagia   Mixed hyperlipidemia   Hypothyroidism   Right pleural effusion: Patient presented with progressively worsening cough ,shortness of breath and fatigue. CT chest found to have large right-sided pleural effusion. Etiology most likely secondary to malignancy could be a sequelae of progressive metastatic disease. Unlikely secondary to infection in the absence of fever or leukocytosis. IR consulted ,  Patient underwent thoracocentesis.  Malignant neoplasm metastatic to the liver Oncology is consulted Dr. Payton Mccallum states she  has poor prognosis.  We discussed the prognosis with the patient.  There is no other treatment options available. He recommended  hospice.  Hypercalcemia: Likely related to malignancy. Continue IV hydration.  CKD stage IIIb: Serum creatinine at baseline.   Avoid nephrotoxic medications.  Essential hypertension.: Continue amlodipine. Hydralazine  IV as needed.  GERD: Pantoprazole  Dysphagia: Patient reports transient dysphagia and was last evaluated by speech therapy at assisted living Obtain a speech and swallow evaluation.   Hypothyroidism: Continue Synthroid  Hyperlipidemia: Continue Niacin     DVT prophylaxis: SCDs Code Status: DNR Family Communication: No family at bedside Disposition Plan:   Status is: Inpatient Remains inpatient appropriate because: Admitted for recurrent right pleural effusion underwent thoracocentesis. Oncology recommended hospice.  Referral has been placed.   Consultants:  Oncology  Procedures: Thoracocentesis   antimicrobials:  Anti-infectives (From admission, onward)    None        Subjective: Patient was seen and examined at bedside.  Overnight events noted.   Patient underwent right thoracocentesis, reports feeling better.  Breathing has improved. Patient seems upset when she heard that she has poor prognosis after having conversation with Dr. Sonny Dandy  Objective: Vitals:   03/23/22 0856 03/23/22 1106 03/23/22 1152 03/23/22 1224  BP: 130/70 135/71 (!) 127/58 124/77  Pulse: 84   76  Resp: 20   16  Temp: 97.8 F (36.6 C)   97.8 F (36.6 C)  TempSrc: Oral   Oral  SpO2: 93%   95%  Weight:      Height:        Intake/Output Summary (Last 24 hours) at 03/23/2022 1530 Last data filed at 03/23/2022 0900 Gross per 24 hour  Intake 0 ml  Output --  Net 0 ml   Filed Weights   03/22/22 0841 03/22/22 2019  Weight: 61.7 kg 58.7 kg    Examination:  General exam:  Appears deconditioned, comfortable, not in any acute distress. Respiratory system: CTA bilaterally, no wheezing, no crackles, normal respiratory effort. Cardiovascular system: S1 & S2  heard, clear rate and rhythm, no murmur. Gastrointestinal system: Abdomen is soft, nontender, nondistended, BS+. Central nervous system: Alert and oriented X 3 . No focal neurological deficits. Extremities: No edema, no cyanosis, no clubbing. Skin: No rashes, lesions or ulcers Psychiatry: Judgement and insight appear normal. Mood & affect appropriate.     Data Reviewed: I have personally reviewed following labs and imaging studies  CBC: Recent Labs  Lab 03/22/22 0840 03/23/22 0343  WBC 8.2 8.4  NEUTROABS 6.5 6.5  HGB 14.7 13.2  HCT 43.8 40.2  MCV 86.4 88.4  PLT 217 102   Basic Metabolic Panel: Recent Labs  Lab 03/22/22 0840 03/23/22 0343  NA 138 141  K 3.9 3.9  CL 100 104  CO2 29 29  GLUCOSE 109* 102*  BUN 13 15  CREATININE 1.56* 1.61*  CALCIUM 11.1* 10.5*  MG 2.2 2.2  PHOS 2.6  --    GFR: Estimated Creatinine Clearance: 17.7 mL/min (A) (by C-G formula based on SCr of 1.61 mg/dL (H)). Liver Function Tests: Recent Labs  Lab 03/22/22 0840 03/23/22 0343  AST 16 12*  ALT 13 11  ALKPHOS 75 59  BILITOT 0.7 0.6  PROT 7.3 6.2*  ALBUMIN 3.3* 2.8*   Recent Labs  Lab 03/22/22 0840  LIPASE 35   No results for input(s): "AMMONIA" in the last 168 hours. Coagulation Profile: Recent Labs  Lab 03/23/22 0343  INR 1.1   Cardiac Enzymes: No results for input(s): "CKTOTAL", "CKMB", "CKMBINDEX", "TROPONINI" in the last 168 hours. BNP (last 3 results) No results for input(s): "PROBNP" in the last 8760 hours. HbA1C: No results for input(s): "HGBA1C" in the last 72 hours. CBG: No results for input(s): "GLUCAP" in the last 168 hours. Lipid Profile: No results for input(s): "CHOL", "HDL", "LDLCALC", "TRIG", "CHOLHDL", "LDLDIRECT" in the last 72 hours. Thyroid Function Tests: Recent Labs    03/23/22 0343  TSH 3.173   Anemia Panel: No results for input(s): "VITAMINB12", "FOLATE", "FERRITIN", "TIBC", "IRON", "RETICCTPCT" in the last 72 hours. Sepsis Labs: Recent  Labs  Lab 03/22/22 0946  LATICACIDVEN 1.1    Recent Results (from the past 240 hour(s))  Resp Panel by RT-PCR (Flu A&B, Covid) Anterior Nasal Swab     Status: None   Collection Time: 03/22/22  9:46 AM   Specimen: Anterior Nasal Swab  Result Value Ref Range Status   SARS Coronavirus 2 by RT PCR NEGATIVE NEGATIVE Final    Comment: (NOTE) SARS-CoV-2 target nucleic acids are NOT DETECTED.  The SARS-CoV-2 RNA is generally detectable in upper respiratory specimens during the acute phase of infection. The lowest concentration of SARS-CoV-2 viral copies this assay can detect is 138 copies/mL. A negative result does not preclude SARS-Cov-2 infection and should not be used as the sole basis for treatment or other patient management decisions. A negative result may occur with  improper specimen collection/handling, submission of specimen other than nasopharyngeal swab, presence of viral mutation(s) within the areas targeted by this assay, and inadequate number of viral copies(<138 copies/mL). A negative result must be combined with clinical observations, patient history, and epidemiological information. The expected result is Negative.  Fact Sheet for Patients:  EntrepreneurPulse.com.au  Fact Sheet for Healthcare Providers:  IncredibleEmployment.be  This test is no t yet approved or cleared by the [place] FDA and  has been authorized for detection and/or diagnosis  of SARS-CoV-2 by FDA under an Emergency Use Authorization (EUA). This EUA will remain  in effect (meaning this test can be used) for the duration of the COVID-19 declaration under Section 564(b)(1) of the Act, 21 U.S.C.section 360bbb-3(b)(1), unless the authorization is terminated  or revoked sooner.       Influenza A by PCR NEGATIVE NEGATIVE Final   Influenza B by PCR NEGATIVE NEGATIVE Final    Comment: (NOTE) The Xpert Xpress SARS-CoV-2/FLU/RSV plus assay is intended as an  aid in the diagnosis of influenza from Nasopharyngeal swab specimens and should not be used as a sole basis for treatment. Nasal washings and aspirates are unacceptable for Xpert Xpress SARS-CoV-2/FLU/RSV testing.  Fact Sheet for Patients: EntrepreneurPulse.com.au  Fact Sheet for Healthcare Providers: IncredibleEmployment.be  This test is not yet approved or cleared by the [place] FDA and has been authorized for detection and/or diagnosis of SARS-CoV-2 by FDA under an Emergency Use Authorization (EUA). This EUA will remain in effect (meaning this test can be used) for the duration of the COVID-19 declaration under Section 564(b)(1) of the Act, 21 U.S.C. section 360bbb-3(b)(1), unless the authorization is terminated or revoked.  Performed at [facility], [address] [place], [place] [address]    Radiology Studies: DG Chest [place] 1 View  Result Date: 03/23/2022 CLINICAL DATA:  Status post right thoracentesis. EXAM: PORTABLE CHEST 1 VIEW COMPARISON:  03/22/2022. FINDINGS: Decrease in right pleural fluid following thoracentesis. There is persistent right lung base opacity consistent with a combination of residual pleural fluid and atelectasis, obscuring the right hemidiaphragm. Remainder of the lungs is clear.  No left pleural effusion. No pneumothorax. IMPRESSION: 1. Decrease in right pleural fluid following thoracentesis. No pneumothorax. Electronically Signed   By: Lajean Manes M.D.   On: 03/23/2022 12:18   US THORACENTESIS ASP PLEURAL SPACE W/IMG GUIDE  Result Date: 03/23/2022 INDICATION: Patient with history of adenocarcinoma of liver and CKD stage 3. Patient complained of shortness of breath and fatigue was found to have large right pleural effusion. Request for diagnostic and therapeutic right thoracentesis. EXAM: ULTRASOUND GUIDED DIAGNOSTIC AND THERAPEUTIC RIGHT THORACENTESIS MEDICATIONS: 10 mL 1 % lidocaine COMPLICATIONS:  None immediate. PROCEDURE: An ultrasound guided thoracentesis was thoroughly discussed with the patient and questions answered. The benefits, risks, alternatives and complications were also discussed. The patient understands and wishes to proceed with the procedure. Written consent was obtained. Ultrasound was performed to localize and mark an adequate pocket of fluid in the right chest. The area was then prepped and draped in the normal sterile fashion. 1% Lidocaine was used for local anesthesia. Under ultrasound guidance a 6 Fr Safe-T-Centesis catheter was introduced. Thoracentesis was performed. The catheter was removed and a dressing applied. FINDINGS: A total of approximately 1.8 L of clear, amber fluid was removed. Samples were sent to the laboratory as requested by the clinical team. IMPRESSION: Successful ultrasound guided right thoracentesis yielding 1.8 L of pleural fluid. Read by: Narda Rutherford, AGNP-BC Electronically Signed   By: Corrie Mckusick D.O.   On: 03/23/2022 12:12   CT CHEST ABDOMEN PELVIS WO CONTRAST  Result Date: 03/22/2022 CLINICAL DATA:  Worsening shortness of breath and dysphagia. History of metastatic non-small cell lung cancer with partial hepatectomy. Metastatic disease evaluation. * Tracking Code: BO * EXAM: CT CHEST, ABDOMEN AND PELVIS WITHOUT CONTRAST TECHNIQUE: Multidetector CT imaging of the chest, abdomen and pelvis was performed following the standard protocol without IV contrast. RADIATION DOSE REDUCTION: This exam was performed according to  the departmental dose-optimization program which includes automated exposure control, adjustment of the mA and/or kV according to patient size and/or use of iterative reconstruction technique. COMPARISON:  Prior CTs 12/12/2021 and 10/05/2021. FINDINGS: CT CHEST FINDINGS Cardiovascular: No acute vascular findings are identified on noncontrast imaging. There is diffuse atherosclerosis of the aorta, great vessels and coronary arteries. There are  calcifications of the aortic valve. A small amount of pericardial fluid is similar to the previous study. The heart size is normal. Mediastinum/Nodes: There are no enlarged mediastinal, hilar or axillary lymph nodes. Hilar assessment is limited by the lack of intravenous contrast, although the hilar contours appear unchanged. Previously demonstrated right pericardiac node has decreased in size, measuring 6 mm on image 47/3. The thyroid gland, trachea and esophagus demonstrate no significant findings. Lungs/Pleura: As seen on earlier chest radiographs, there is an enlarging right pleural effusion which is large. The right hemidiaphragm is inverted. No definite pleural base nodularity identified on noncontrast imaging. There is resulting complete right lower lobe collapse with partial dependent atelectasis in the right upper and middle lobes. There are multiple new ill-defined pulmonary nodules bilaterally, some of which are cavitary, suspicious for pulmonary metastases. Representative nodules include a 4 mm right upper lobe nodule (image 64/5), a 7 mm right upper lobe nodule (image 66/5) and a 7 mm left lower lobe nodule (image 124/5. Musculoskeletal/Chest wall: No chest wall mass or suspicious osseous findings. Chronic deformity of the right 9th rib laterally, likely related to previous thoracotomy. Right mastectomy. CT ABDOMEN AND PELVIS FINDINGS Hepatobiliary: Status post partial hepatectomy. Enlarging soft tissue mass adjacent to the resection site, measuring approximately 5.1 x 2.6 cm on image 59/3. This likely involves the right hemidiaphragm. No other definite abnormality of the remaining liver identified on noncontrast imaging. Stable chronic extrahepatic biliary dilatation status post cholecystectomy. Pancreas: Chronically atrophied. No focal abnormality or surrounding inflammation. Spleen: Scattered calcified granulomas.  No focal abnormality. Adrenals/Urinary Tract: Mild nonspecific low-density prominence  of both adrenal glands without focal nodularity. Nonobstructing renal calculi bilaterally. No evidence of hydronephrosis or ureteral calculus. The bladder appears normal for its degree of distention. Stomach/Bowel: No enteric contrast administered. The stomach appears unremarkable for its degree of distension. No evidence of bowel wall thickening, distention or surrounding inflammatory change. Vascular/Lymphatic: There are no enlarged abdominal or pelvic lymph nodes. Aortic and branch vessel atherosclerosis without evidence of aneurysm. Reproductive: Hysterectomy. No change in 4.2 cm low-density left adnexal lesion, likely benign. Pelvic floor laxity. Other: No ascites or peritoneal nodularity. Postsurgical changes in the anterior abdominal wall. Musculoskeletal: Chronic anterolisthesis at L4-5 with associated chronic biforaminal narrowing. No acute osseous findings. IMPRESSION: 1. Suspected progressive tumor recurrence along the partial hepatectomy site, contiguous with the right hemidiaphragm. 2. Significant enlargement of a right pleural effusion with resulting inversion of the right hemidiaphragm. Although no definite pleural nodularity is identified on noncontrast imaging, this may reflect a malignant effusion. It certainly may contribute to the patient's shortness of breath. Consider thoracentesis. 3. Scattered new small pulmonary nodules bilaterally, suspicious for metastatic disease. 4. Nonobstructing bilateral renal calculi. Coronary and Aortic Atherosclerosis (ICD10-I70.0). Additional incidental findings as detailed above. Electronically Signed   By: Richardean Sale M.D.   On: 03/22/2022 10:41   CT Head Wo Contrast  Result Date: 03/22/2022 CLINICAL DATA:  Provided history: Brain metastases suspected. EXAM: CT HEAD WITHOUT CONTRAST TECHNIQUE: Contiguous axial images were obtained from the base of the skull through the vertex without intravenous contrast. RADIATION DOSE REDUCTION: This exam was performed  according  to the departmental dose-optimization program which includes automated exposure control, adjustment of the mA and/or kV according to patient size and/or use of iterative reconstruction technique. COMPARISON:  Brain MRI 06/22/2015.  Head CT 06/16/2015. FINDINGS: Brain: Moderate cerebral atrophy. Comparatively mild cerebellar atrophy. Moderate patchy and ill-defined hypoattenuation within the cerebral white matter, nonspecific but compatible chronic small vessel ischemic disease. There is no acute intracranial hemorrhage. No demarcated cortical infarct. No extra-axial fluid collection. No evidence of an intracranial mass. No midline shift. Vascular: No hyperdense vessel. Atherosclerotic calcifications. Skull: No fracture or aggressive osseous lesion. Sinuses/Orbits: No mass or acute finding within the imaged orbits. Minimal mucosal thickening within anterior right ethmoid air cells. Opacification of posterior ethmoid air cells, bilaterally. Opacification of the left sphenoid sinus. IMPRESSION: No evidence of acute intracranial abnormality. No evidence of intracranial metastatic disease, although a non-contrast head CT has limited sensitivity. Consider a brain MRI without and with contrast for further evaluation. Moderate chronic small vessel ischemic changes within the cerebral white matter. Moderate cerebral atrophy. Comparatively mild cerebellar atrophy. Paranasal sinus disease at the imaged levels, as described. Electronically Signed   By: Kellie Simmering D.O.   On: 03/22/2022 10:29   DG Chest Portable 1 View  Result Date: 03/22/2022 CLINICAL DATA:  Shortness of breath and cough after drinking water. Recently diagnosed with liver cancer. EXAM: PORTABLE CHEST 1 VIEW COMPARISON:  02/12/2016 chest x-ray and CT of the chest on 01/01/2022 FINDINGS: There is new, significant opacity in the RIGHT hemithorax. A portion of this represents pleural effusion. The remainder of the opacity is consistent with  atelectasis or consolidation. LEFT lung is clear. No pulmonary edema. No pneumothorax. IMPRESSION: Significant increase in opacity of the RIGHT hemithorax, portion of which is pleural effusion. The remainder of the opacity could be related to atelectasis, or consolidation. Electronically Signed   By: Nolon Nations M.D.   On: 03/22/2022 09:19    Scheduled Meds:  amLODipine  5 mg Oral Daily   docusate sodium  100 mg Oral Q M,W,F   feeding supplement  237 mL Oral TID BM   fiber  1 packet Oral Daily   levothyroxine  100 mcg Oral Q0600   magnesium oxide  200 mg Oral Daily   multivitamin with minerals  1 tablet Oral Daily   niacin  500 mg Oral Daily   pantoprazole  40 mg Oral Daily   Continuous Infusions:  lactated ringers 75 mL/hr at 03/22/22 2323     LOS: 0 days    Time spent: 35 mins    Keimya Briddell, MD [facility]   If 7PM-7AM, please contact night-coverage

## 2022-03-24 DIAGNOSIS — Z66 Do not resuscitate: Secondary | ICD-10-CM

## 2022-03-24 DIAGNOSIS — Z515 Encounter for palliative care: Secondary | ICD-10-CM

## 2022-03-24 DIAGNOSIS — J90 Pleural effusion, not elsewhere classified: Secondary | ICD-10-CM | POA: Diagnosis not present

## 2022-03-24 LAB — CALCIUM, IONIZED: Calcium, Ionized, Serum: 6.1 mg/dL — ABNORMAL HIGH (ref 4.5–5.6)

## 2022-03-24 MED ORDER — ALPRAZOLAM 0.5 MG PO TABS: 0.5000 mg | ORAL_TABLET | Freq: Every evening | ORAL | 0 refills | Status: AC | PRN

## 2022-03-24 NOTE — Progress Notes (Signed)
AVS given to patient and explained at the bedside. Medications and follow up appointments have been explained with pt verbalizing understanding.  

## 2022-03-24 NOTE — Discharge Summary (Signed)
Physician Discharge Summary  Destiny Velasquez UUV:[id number] DOB: 1925/09/08 DOA: 03/22/2022  PCP: Wynelle Fanny, DO  Admit date: 03/22/2022 Discharge date: 03/24/2022  Admitted From: ALF Disposition:  ALF with home hospice   Discharge Condition: Stable, poor prognosis overall CODE STATUS: DNR  Diet recommendation:  Diet Orders (From admission, onward)     Start     Ordered   03/23/22 1259  Diet regular Room service appropriate? Yes; Fluid consistency: Thin  Diet effective now       Question Answer Comment  Room service appropriate? Yes   Fluid consistency: Thin      03/23/22 1258           Brief/Interim Summary: Destiny Velasquez is a 88 year old female with PMH significant of adenocarcinoma of the liver diagnosed in 2016, sigmoid diverticulitis (S/P sigmoidectomy due to colovesical fistula), hyperlipidemia, hypertension, chronic kidney disease stage IIIb, gastroesophageal reflux disease who presented in the ED with complaints of progressively worsening cough, fatigue and shortness of breath on exertion as well as at rest.  Upon evaluation in the ED she was found to have large right-sided pleural effusion as well as enlarging soft tissue mass at the hepatic resection site as well as small bilateral pulmonary nodules concerning for progressive metastatic disease.   Patient was admitted for recurrent pleural effusion.  IR consulted,  Patient underwent successful thoracocentesis.  Oncology Dr. Lindi Adie was consulted.  After discussion with patient regarding worsening CT scan findings, he stated prognosis was guarded and no other treatment options available.  Recommended home hospice, which was arranged prior to discharge.  Discharge Diagnoses:   Principal Problem:   Pleural effusion on right Active Problems:   Malignant neoplasm metastatic to liver [facility])   Hypercalcemia   Chronic kidney disease, stage 3b (HCC)   Essential hypertension   GERD without esophagitis   Dysphagia   Mixed  hyperlipidemia   Hypothyroidism   DNR (do not resuscitate)   Hospice care patient    Discharge Instructions  Discharge Instructions     Increase activity slowly   Complete by: As directed       Allergies as of 03/24/2022       Reactions   Ephedrine Hcl Other (See Comments)   Made the patient "feel funny"   Levofloxacin Other (See Comments)   "Unintelligible" and caused speech difficulty- "allergic," per facility   Miconazole Other (See Comments)   Reaction not known        Medication List     STOP taking these medications    nitrofurantoin 100 MG capsule Commonly known as: MACRODANTIN   omeprazole 20 MG capsule Commonly known as: PRILOSEC   Vitamin D3 250 MCG (10000 UT) Tabs       TAKE these medications    acetaminophen 325 MG tablet Commonly known as: TYLENOL Take 650 mg by mouth every 8 (eight) hours as needed (pain or headaches).   ALPRAZolam 0.5 MG tablet Commonly known as: Xanax Take 1 tablet (0.5 mg total) by mouth at bedtime as needed for sleep.   amLODipine 5 MG tablet Commonly known as: NORVASC Take 5 mg by mouth daily.   Benefiber Powd Take 8-10 g by mouth daily.   bisacodyl 10 MG suppository Commonly known as: DULCOLAX Place 10 mg rectally daily as needed for mild constipation or moderate constipation.   CRANBERRY PLUS VITAMIN C PO Take 2 capsules by mouth daily.   docusate sodium 100 MG capsule Commonly known as: COLACE Take 100 mg by mouth every Monday, Wednesday,  and Friday.   guaiFENesin 600 MG 12 hr tablet Commonly known as: MUCINEX Take 600 mg by mouth every 12 (twelve) hours as needed (for wheezing).   hydrocortisone 1 % ointment Apply 1 Application topically See admin instructions. Apply daily as needed for hemorrhoids   levothyroxine 100 MCG tablet Commonly known as: SYNTHROID Take 100 mcg by mouth daily before breakfast.   loratadine 10 MG tablet Commonly known as: CLARITIN Take 10 mg by mouth daily.    Magnesium 250 MG Tabs Take 250 mg by mouth daily.   niacin 500 MG ER tablet Commonly known as: VITAMIN B3 Take 500 mg by mouth daily.   omeprazole 20 MG tablet Commonly known as: PRILOSEC OTC Take 40 mg by mouth 2 (two) times daily before a meal.   ondansetron 8 MG tablet Commonly known as: Zofran Take 1 tablet (8 mg total) by mouth every 8 (eight) hours as needed for nausea or vomiting.   polyethylene glycol 17 g packet Commonly known as: MIRALAX / GLYCOLAX Take 17 g by mouth 2 (two) times daily.   PreviDent 5000 Enamel Protect 1.1-5 % Gel Generic drug: Sod Fluoride-Potassium Nitrate See admin instructions. Brush using 1 ribbon of paste once a day   senna 8.6 MG Tabs tablet Commonly known as: SENOKOT Take 2 tablets by mouth daily as needed for mild constipation.   Simethicone 180 MG Caps Take 180 mg by mouth every 6 (six) hours as needed ("for pain from constipation").   Zinc Oxide 22 % Crea Place 1 application  rectally every 6 (six) hours as needed (for pain).        Allergies  Allergen Reactions   Ephedrine Hcl Other (See Comments)    Made the patient "feel funny"   Levofloxacin Other (See Comments)    "Unintelligible" and caused speech difficulty- "allergic," per facility   Miconazole Other (See Comments)    Reaction not known    Consultations: Oncology IR    Procedures/Studies: DG Chest Port 1 View  Result Date: 03/23/2022 CLINICAL DATA:  Status post right thoracentesis. EXAM: PORTABLE CHEST 1 VIEW COMPARISON:  03/22/2022. FINDINGS: Decrease in right pleural fluid following thoracentesis. There is persistent right lung base opacity consistent with a combination of residual pleural fluid and atelectasis, obscuring the right hemidiaphragm. Remainder of the lungs is clear.  No left pleural effusion. No pneumothorax. IMPRESSION: 1. Decrease in right pleural fluid following thoracentesis. No pneumothorax. Electronically Signed   By: Lajean Manes M.D.   On:  03/23/2022 12:18   US THORACENTESIS ASP PLEURAL SPACE W/IMG GUIDE  Result Date: 03/23/2022 INDICATION: Patient with history of adenocarcinoma of liver and CKD stage 3. Patient complained of shortness of breath and fatigue was found to have large right pleural effusion. Request for diagnostic and therapeutic right thoracentesis. EXAM: ULTRASOUND GUIDED DIAGNOSTIC AND THERAPEUTIC RIGHT THORACENTESIS MEDICATIONS: 10 mL 1 % lidocaine COMPLICATIONS: None immediate. PROCEDURE: An ultrasound guided thoracentesis was thoroughly discussed with the patient and questions answered. The benefits, risks, alternatives and complications were also discussed. The patient understands and wishes to proceed with the procedure. Written consent was obtained. Ultrasound was performed to localize and mark an adequate pocket of fluid in the right chest. The area was then prepped and draped in the normal sterile fashion. 1% Lidocaine was used for local anesthesia. Under ultrasound guidance a 6 Fr Safe-T-Centesis catheter was introduced. Thoracentesis was performed. The catheter was removed and a dressing applied. FINDINGS: A total of approximately 1.8 L of clear, amber fluid was  removed. Samples were sent to the laboratory as requested by the clinical team. IMPRESSION: Successful ultrasound guided right thoracentesis yielding 1.8 L of pleural fluid. Read by: Narda Rutherford, AGNP-BC Electronically Signed   By: Corrie Mckusick D.O.   On: 03/23/2022 12:12   CT CHEST ABDOMEN PELVIS WO CONTRAST  Result Date: 03/22/2022 CLINICAL DATA:  Worsening shortness of breath and dysphagia. History of metastatic non-small cell lung cancer with partial hepatectomy. Metastatic disease evaluation. * Tracking Code: BO * EXAM: CT CHEST, ABDOMEN AND PELVIS WITHOUT CONTRAST TECHNIQUE: Multidetector CT imaging of the chest, abdomen and pelvis was performed following the standard protocol without IV contrast. RADIATION DOSE REDUCTION: This exam was performed  according to the departmental dose-optimization program which includes automated exposure control, adjustment of the mA and/or kV according to patient size and/or use of iterative reconstruction technique. COMPARISON:  Prior CTs 12/12/2021 and 10/05/2021. FINDINGS: CT CHEST FINDINGS Cardiovascular: No acute vascular findings are identified on noncontrast imaging. There is diffuse atherosclerosis of the aorta, great vessels and coronary arteries. There are calcifications of the aortic valve. A small amount of pericardial fluid is similar to the previous study. The heart size is normal. Mediastinum/Nodes: There are no enlarged mediastinal, hilar or axillary lymph nodes. Hilar assessment is limited by the lack of intravenous contrast, although the hilar contours appear unchanged. Previously demonstrated right pericardiac node has decreased in size, measuring 6 mm on image 47/3. The thyroid gland, trachea and esophagus demonstrate no significant findings. Lungs/Pleura: As seen on earlier chest radiographs, there is an enlarging right pleural effusion which is large. The right hemidiaphragm is inverted. No definite pleural base nodularity identified on noncontrast imaging. There is resulting complete right lower lobe collapse with partial dependent atelectasis in the right upper and middle lobes. There are multiple new ill-defined pulmonary nodules bilaterally, some of which are cavitary, suspicious for pulmonary metastases. Representative nodules include a 4 mm right upper lobe nodule (image 64/5), a 7 mm right upper lobe nodule (image 66/5) and a 7 mm left lower lobe nodule (image 124/5. Musculoskeletal/Chest wall: No chest wall mass or suspicious osseous findings. Chronic deformity of the right 9th rib laterally, likely related to previous thoracotomy. Right mastectomy. CT ABDOMEN AND PELVIS FINDINGS Hepatobiliary: Status post partial hepatectomy. Enlarging soft tissue mass adjacent to the resection site, measuring  approximately 5.1 x 2.6 cm on image 59/3. This likely involves the right hemidiaphragm. No other definite abnormality of the remaining liver identified on noncontrast imaging. Stable chronic extrahepatic biliary dilatation status post cholecystectomy. Pancreas: Chronically atrophied. No focal abnormality or surrounding inflammation. Spleen: Scattered calcified granulomas.  No focal abnormality. Adrenals/Urinary Tract: Mild nonspecific low-density prominence of both adrenal glands without focal nodularity. Nonobstructing renal calculi bilaterally. No evidence of hydronephrosis or ureteral calculus. The bladder appears normal for its degree of distention. Stomach/Bowel: No enteric contrast administered. The stomach appears unremarkable for its degree of distension. No evidence of bowel wall thickening, distention or surrounding inflammatory change. Vascular/Lymphatic: There are no enlarged abdominal or pelvic lymph nodes. Aortic and branch vessel atherosclerosis without evidence of aneurysm. Reproductive: Hysterectomy. No change in 4.2 cm low-density left adnexal lesion, likely benign. Pelvic floor laxity. Other: No ascites or peritoneal nodularity. Postsurgical changes in the anterior abdominal wall. Musculoskeletal: Chronic anterolisthesis at L4-5 with associated chronic biforaminal narrowing. No acute osseous findings. IMPRESSION: 1. Suspected progressive tumor recurrence along the partial hepatectomy site, contiguous with the right hemidiaphragm. 2. Significant enlargement of a right pleural effusion with resulting inversion of the right hemidiaphragm.  Although no definite pleural nodularity is identified on noncontrast imaging, this may reflect a malignant effusion. It certainly may contribute to the patient's shortness of breath. Consider thoracentesis. 3. Scattered new small pulmonary nodules bilaterally, suspicious for metastatic disease. 4. Nonobstructing bilateral renal calculi. Coronary and Aortic  Atherosclerosis (ICD10-I70.0). Additional incidental findings as detailed above. Electronically Signed   By: Richardean Sale M.D.   On: 03/22/2022 10:41   CT Head Wo Contrast  Result Date: 03/22/2022 CLINICAL DATA:  Provided history: Brain metastases suspected. EXAM: CT HEAD WITHOUT CONTRAST TECHNIQUE: Contiguous axial images were obtained from the base of the skull through the vertex without intravenous contrast. RADIATION DOSE REDUCTION: This exam was performed according to the departmental dose-optimization program which includes automated exposure control, adjustment of the mA and/or kV according to patient size and/or use of iterative reconstruction technique. COMPARISON:  Brain MRI 06/22/2015.  Head CT 06/16/2015. FINDINGS: Brain: Moderate cerebral atrophy. Comparatively mild cerebellar atrophy. Moderate patchy and ill-defined hypoattenuation within the cerebral white matter, nonspecific but compatible chronic small vessel ischemic disease. There is no acute intracranial hemorrhage. No demarcated cortical infarct. No extra-axial fluid collection. No evidence of an intracranial mass. No midline shift. Vascular: No hyperdense vessel. Atherosclerotic calcifications. Skull: No fracture or aggressive osseous lesion. Sinuses/Orbits: No mass or acute finding within the imaged orbits. Minimal mucosal thickening within anterior right ethmoid air cells. Opacification of posterior ethmoid air cells, bilaterally. Opacification of the left sphenoid sinus. IMPRESSION: No evidence of acute intracranial abnormality. No evidence of intracranial metastatic disease, although a non-contrast head CT has limited sensitivity. Consider a brain MRI without and with contrast for further evaluation. Moderate chronic small vessel ischemic changes within the cerebral white matter. Moderate cerebral atrophy. Comparatively mild cerebellar atrophy. Paranasal sinus disease at the imaged levels, as described. Electronically Signed   By:  Kellie Simmering D.O.   On: 03/22/2022 10:29   DG Chest Portable 1 View  Result Date: 03/22/2022 CLINICAL DATA:  Shortness of breath and cough after drinking water. Recently diagnosed with liver cancer. EXAM: PORTABLE CHEST 1 VIEW COMPARISON:  02/12/2016 chest x-ray and CT of the chest on 01/01/2022 FINDINGS: There is new, significant opacity in the RIGHT hemithorax. A portion of this represents pleural effusion. The remainder of the opacity is consistent with atelectasis or consolidation. LEFT lung is clear. No pulmonary edema. No pneumothorax. IMPRESSION: Significant increase in opacity of the RIGHT hemithorax, portion of which is pleural effusion. The remainder of the opacity could be related to atelectasis, or consolidation. Electronically Signed   By: Nolon Nations M.D.   On: 03/22/2022 09:19       Discharge Exam: Vitals:   03/23/22 2117 03/24/22 0512  BP: 125/67 (!) 133/59  Pulse: 82 75  Resp: 20 18  Temp: 98.4 F (36.9 C) 98.4 F (36.9 C)  SpO2: 93% 92%    General: Pt is alert, awake, not in acute distress Cardiovascular: RRR, S1/S2 +, no edema Respiratory: CTA bilaterally, no wheezing, no rhonchi, no respiratory distress, no conversational dyspnea  Abdominal: Soft, NT, ND, bowel sounds + Extremities: no edema, no cyanosis Psych: Normal mood and affect, stable judgement and insight     The results of significant diagnostics from this hospitalization (including imaging, microbiology, ancillary and laboratory) are listed below for reference.     Microbiology: Recent Results (from the past 240 hour(s))  Resp Panel by RT-PCR (Flu A&B, Covid) Anterior Nasal Swab     Status: None   Collection Time: 03/22/22  9:46 AM  Specimen: Anterior Nasal Swab  Result Value Ref Range Status   SARS Coronavirus 2 by RT PCR NEGATIVE NEGATIVE Final    Comment: (NOTE) SARS-CoV-2 target nucleic acids are NOT DETECTED.  The SARS-CoV-2 RNA is generally detectable in upper respiratory specimens  during the acute phase of infection. The lowest concentration of SARS-CoV-2 viral copies this assay can detect is 138 copies/mL. A negative result does not preclude SARS-Cov-2 infection and should not be used as the sole basis for treatment or other patient management decisions. A negative result may occur with  improper specimen collection/handling, submission of specimen other than nasopharyngeal swab, presence of viral mutation(s) within the areas targeted by this assay, and inadequate number of viral copies(<138 copies/mL). A negative result must be combined with clinical observations, patient history, and epidemiological information. The expected result is Negative.  Fact Sheet for Patients:  EntrepreneurPulse.com.au  Fact Sheet for Healthcare Providers:  IncredibleEmployment.be  This test is no t yet approved or cleared by the [place] FDA and  has been authorized for detection and/or diagnosis of SARS-CoV-2 by FDA under an Emergency Use Authorization (EUA). This EUA will remain  in effect (meaning this test can be used) for the duration of the COVID-19 declaration under Section 564(b)(1) of the Act, 21 U.S.C.section 360bbb-3(b)(1), unless the authorization is terminated  or revoked sooner.       Influenza A by PCR NEGATIVE NEGATIVE Final   Influenza B by PCR NEGATIVE NEGATIVE Final    Comment: (NOTE) The Xpert Xpress SARS-CoV-2/FLU/RSV plus assay is intended as an aid in the diagnosis of influenza from Nasopharyngeal swab specimens and should not be used as a sole basis for treatment. Nasal washings and aspirates are unacceptable for Xpert Xpress SARS-CoV-2/FLU/RSV testing.  Fact Sheet for Patients: EntrepreneurPulse.com.au  Fact Sheet for Healthcare Providers: IncredibleEmployment.be  This test is not yet approved or cleared by the [place] FDA and has been authorized for detection  and/or diagnosis of SARS-CoV-2 by FDA under an Emergency Use Authorization (EUA). This EUA will remain in effect (meaning this test can be used) for the duration of the COVID-19 declaration under Section 564(b)(1) of the Act, 21 U.S.C. section 360bbb-3(b)(1), unless the authorization is terminated or revoked.  Performed at [facility], [address] [place], [place] [address]   Body fluid culture w Gram Stain     Status: None (Preliminary result)   Collection Time: 03/23/22 11:41 AM   Specimen: PATH Cytology Pleural fluid  Result Value Ref Range Status   Specimen Description   Final    PLEURAL Performed at [facility], [place] [address] [place], [place] [address]    Special Requests   Final    NONE Performed at [facility], [place] [address] [place], [place] [address]    Gram Stain NO ORGANISMS SEEN NO WBC SEEN   Final   Culture   Final    NO GROWTH < 24 HOURS Performed at [facility], [place] [address] [place], [place] [address]    Report Status PENDING  Incomplete     Labs: BNP (last 3 results) Recent Labs    03/22/22 0840  BNP 37.9   Basic Metabolic Panel: Recent Labs  Lab 03/22/22 0840 03/23/22 0343  NA 138 141  K 3.9 3.9  CL 100 104  CO2 29 29  GLUCOSE 109* 102*  BUN 13 15  CREATININE 1.56* 1.61*  CALCIUM 11.1* 10.5*  MG 2.2 2.2  PHOS  2.6  --    Liver Function Tests: Recent Labs  Lab 03/22/22 0840 03/23/22 0343  AST 16 12*  ALT 13 11  ALKPHOS 75 59  BILITOT 0.7 0.6  PROT 7.3 6.2*  ALBUMIN 3.3* 2.8*   Recent Labs  Lab 03/22/22 0840  LIPASE 35   No results for input(s): "AMMONIA" in the last 168 hours. CBC: Recent Labs  Lab 03/22/22 0840 03/23/22 0343  WBC 8.2 8.4  NEUTROABS 6.5 6.5  HGB 14.7 13.2  HCT 43.8 40.2  MCV 86.4 88.4  PLT 217 201   Cardiac Enzymes: No results for input(s): "CKTOTAL", "CKMB", "CKMBINDEX", "TROPONINI" in the last 168 hours. BNP: Invalid input(s):  "POCBNP" CBG: No results for input(s): "GLUCAP" in the last 168 hours. D-Dimer No results for input(s): "DDIMER" in the last 72 hours. Hgb A1c No results for input(s): "HGBA1C" in the last 72 hours. Lipid Profile No results for input(s): "CHOL", "HDL", "LDLCALC", "TRIG", "CHOLHDL", "LDLDIRECT" in the last 72 hours. Thyroid function studies Recent Labs    03/23/22 0343  TSH 3.173   Anemia work up No results for input(s): "VITAMINB12", "FOLATE", "FERRITIN", "TIBC", "IRON", "RETICCTPCT" in the last 72 hours. Urinalysis    Component Value Date/Time   COLORURINE YELLOW 03/22/2022 [place] 03/22/2022 0944   LABSPEC 1.015 03/22/2022 0944   PHURINE 7.5 03/22/2022 0944   GLUCOSEU NEGATIVE 03/22/2022 0944   HGBUR TRACE (A) 03/22/2022 0944   BILIRUBINUR NEGATIVE 03/22/2022 0944   KETONESUR NEGATIVE 03/22/2022 0944   PROTEINUR NEGATIVE 03/22/2022 0944   UROBILINOGEN 0.2 06/22/2015 1507   NITRITE NEGATIVE 03/22/2022 0944   LEUKOCYTESUR SMALL (A) 03/22/2022 0944   Sepsis Labs Recent Labs  Lab 03/22/22 0840 03/23/22 0343  WBC 8.2 8.4   Microbiology Recent Results (from the past 240 hour(s))  Resp Panel by RT-PCR (Flu A&B, Covid) Anterior Nasal Swab     Status: None   Collection Time: 03/22/22  9:46 AM   Specimen: Anterior Nasal Swab  Result Value Ref Range Status   SARS Coronavirus 2 by RT PCR NEGATIVE NEGATIVE Final    Comment: (NOTE) SARS-CoV-2 target nucleic acids are NOT DETECTED.  The SARS-CoV-2 RNA is generally detectable in upper respiratory specimens during the acute phase of infection. The lowest concentration of SARS-CoV-2 viral copies this assay can detect is 138 copies/mL. A negative result does not preclude SARS-Cov-2 infection and should not be used as the sole basis for treatment or other patient management decisions. A negative result may occur with  improper specimen collection/handling, submission of specimen other than nasopharyngeal swab,  presence of viral mutation(s) within the areas targeted by this assay, and inadequate number of viral copies(<138 copies/mL). A negative result must be combined with clinical observations, patient history, and epidemiological information. The expected result is Negative.  Fact Sheet for Patients:  EntrepreneurPulse.com.au  Fact Sheet for Healthcare Providers:  IncredibleEmployment.be  This test is no t yet approved or cleared by the [place] FDA and  has been authorized for detection and/or diagnosis of SARS-CoV-2 by FDA under an Emergency Use Authorization (EUA). This EUA will remain  in effect (meaning this test can be used) for the duration of the COVID-19 declaration under Section 564(b)(1) of the Act, 21 U.S.C.section 360bbb-3(b)(1), unless the authorization is terminated  or revoked sooner.       Influenza A by PCR NEGATIVE NEGATIVE Final   Influenza B by PCR NEGATIVE NEGATIVE Final    Comment: (NOTE) The Xpert Xpress SARS-CoV-2/FLU/RSV plus assay  is intended as an aid in the diagnosis of influenza from Nasopharyngeal swab specimens and should not be used as a sole basis for treatment. Nasal washings and aspirates are unacceptable for Xpert Xpress SARS-CoV-2/FLU/RSV testing.  Fact Sheet for Patients: EntrepreneurPulse.com.au  Fact Sheet for Healthcare Providers: IncredibleEmployment.be  This test is not yet approved or cleared by the [place] FDA and has been authorized for detection and/or diagnosis of SARS-CoV-2 by FDA under an Emergency Use Authorization (EUA). This EUA will remain in effect (meaning this test can be used) for the duration of the COVID-19 declaration under Section 564(b)(1) of the Act, 21 U.S.C. section 360bbb-3(b)(1), unless the authorization is terminated or revoked.  Performed at [facility], [address] [place], [place] [address]   Body fluid  culture w Gram Stain     Status: None (Preliminary result)   Collection Time: 03/23/22 11:41 AM   Specimen: PATH Cytology Pleural fluid  Result Value Ref Range Status   Specimen Description   Final    PLEURAL Performed at [facility], [place] [address], [place], [place] [address]    Special Requests   Final    NONE Performed at [facility] [address] [place], [place] [address]    Gram Stain NO ORGANISMS SEEN NO WBC SEEN   Final   Culture   Final    NO GROWTH < 24 HOURS Performed at [facility], [place] [address] [place], [place] [address]    Report Status PENDING  Incomplete     Patient was seen and examined on the day of discharge and was found to be in stable condition. Time coordinating discharge: 40 minutes including assessment and coordination of care, as well as examination of the patient.   SIGNED:  Dessa Phi, DO [facility] 03/24/2022, 10:25 AM

## 2022-03-24 NOTE — TOC Transition Note (Addendum)
Transition of Care [facility] - CM/SW Discharge Note   Patient Details  Name: Ahlivia Salahuddin MRN: [id number] Date of Birth: Mar 22, 1926  Transition of Care [facility] CM/SW Contact:  Illene Regulus, LCSW Phone Number: 03/24/2022, 10:34 AM   Clinical Narrative:     CSW attempted to contact [facility] to discuss pt's return to facility. Unable to speak with anyone left VM requesting return call.     11:15am CSW spoke with Baxter Flattery from [facility] she reported there is nothing needed from the hospital only pt's prescriptions. CSW confirmed with Tharon Aquas from [facility] that they have received hospice referral. TOC sign off.     Barriers to Discharge: Continued Medical Work up   Patient Goals and CMS Choice Patient states their goals for this hospitalization and ongoing recovery are:: to go back home and be well CMS Medicare.gov Compare Post Acute Care list provided to:: Patient Choice offered to / list presented to : Patient  Discharge Placement                       Discharge Plan and Services   Discharge Planning Services: CM Consult                                 Social Determinants of Health (SDOH) Interventions     Readmission Risk Interventions     No data to display

## 2022-03-26 LAB — BODY FLUID CULTURE W GRAM STAIN
Culture: NO GROWTH
Gram Stain: NONE SEEN

## 2022-03-27 DIAGNOSIS — J302 Other seasonal allergic rhinitis: Secondary | ICD-10-CM | POA: Diagnosis not present

## 2022-03-27 DIAGNOSIS — C220 Liver cell carcinoma: Secondary | ICD-10-CM | POA: Diagnosis not present

## 2022-03-27 DIAGNOSIS — R41841 Cognitive communication deficit: Secondary | ICD-10-CM | POA: Diagnosis not present

## 2022-03-27 DIAGNOSIS — E1122 Type 2 diabetes mellitus with diabetic chronic kidney disease: Secondary | ICD-10-CM | POA: Diagnosis not present

## 2022-03-27 DIAGNOSIS — E559 Vitamin D deficiency, unspecified: Secondary | ICD-10-CM | POA: Diagnosis not present

## 2022-03-27 DIAGNOSIS — E039 Hypothyroidism, unspecified: Secondary | ICD-10-CM | POA: Diagnosis not present

## 2022-03-27 DIAGNOSIS — N1832 Chronic kidney disease, stage 3b: Secondary | ICD-10-CM | POA: Diagnosis not present

## 2022-03-27 DIAGNOSIS — Z8719 Personal history of other diseases of the digestive system: Secondary | ICD-10-CM | POA: Diagnosis not present

## 2022-03-27 DIAGNOSIS — R634 Abnormal weight loss: Secondary | ICD-10-CM | POA: Diagnosis not present

## 2022-03-27 DIAGNOSIS — I129 Hypertensive chronic kidney disease with stage 1 through stage 4 chronic kidney disease, or unspecified chronic kidney disease: Secondary | ICD-10-CM | POA: Diagnosis not present

## 2022-03-27 DIAGNOSIS — F419 Anxiety disorder, unspecified: Secondary | ICD-10-CM | POA: Diagnosis not present

## 2022-03-27 DIAGNOSIS — E440 Moderate protein-calorie malnutrition: Secondary | ICD-10-CM | POA: Diagnosis not present

## 2022-03-27 DIAGNOSIS — R1310 Dysphagia, unspecified: Secondary | ICD-10-CM | POA: Diagnosis not present

## 2022-03-27 DIAGNOSIS — J90 Pleural effusion, not elsewhere classified: Secondary | ICD-10-CM | POA: Diagnosis not present

## 2022-03-27 DIAGNOSIS — R918 Other nonspecific abnormal finding of lung field: Secondary | ICD-10-CM | POA: Diagnosis not present

## 2022-03-27 DIAGNOSIS — E612 Magnesium deficiency: Secondary | ICD-10-CM | POA: Diagnosis not present

## 2022-03-27 DIAGNOSIS — K219 Gastro-esophageal reflux disease without esophagitis: Secondary | ICD-10-CM | POA: Diagnosis not present

## 2022-03-27 DIAGNOSIS — K7689 Other specified diseases of liver: Secondary | ICD-10-CM | POA: Diagnosis not present

## 2022-03-27 DIAGNOSIS — Z87440 Personal history of urinary (tract) infections: Secondary | ICD-10-CM | POA: Diagnosis not present

## 2022-03-27 LAB — CYTOLOGY - NON PAP

## 2022-03-28 ENCOUNTER — Other Ambulatory Visit (HOSPITAL_COMMUNITY): Payer: Medicare Other

## 2022-03-28 DIAGNOSIS — C220 Liver cell carcinoma: Secondary | ICD-10-CM | POA: Diagnosis not present

## 2022-03-28 DIAGNOSIS — J90 Pleural effusion, not elsewhere classified: Secondary | ICD-10-CM | POA: Diagnosis not present

## 2022-03-28 DIAGNOSIS — R918 Other nonspecific abnormal finding of lung field: Secondary | ICD-10-CM | POA: Diagnosis not present

## 2022-03-28 DIAGNOSIS — K7689 Other specified diseases of liver: Secondary | ICD-10-CM | POA: Diagnosis not present

## 2022-03-28 DIAGNOSIS — E440 Moderate protein-calorie malnutrition: Secondary | ICD-10-CM | POA: Diagnosis not present

## 2022-03-30 DIAGNOSIS — K7689 Other specified diseases of liver: Secondary | ICD-10-CM | POA: Diagnosis not present

## 2022-03-30 DIAGNOSIS — J90 Pleural effusion, not elsewhere classified: Secondary | ICD-10-CM | POA: Diagnosis not present

## 2022-03-30 DIAGNOSIS — R918 Other nonspecific abnormal finding of lung field: Secondary | ICD-10-CM | POA: Diagnosis not present

## 2022-03-30 DIAGNOSIS — C220 Liver cell carcinoma: Secondary | ICD-10-CM | POA: Diagnosis not present

## 2022-03-30 DIAGNOSIS — E440 Moderate protein-calorie malnutrition: Secondary | ICD-10-CM | POA: Diagnosis not present

## 2022-03-31 DIAGNOSIS — K7689 Other specified diseases of liver: Secondary | ICD-10-CM | POA: Diagnosis not present

## 2022-03-31 DIAGNOSIS — R918 Other nonspecific abnormal finding of lung field: Secondary | ICD-10-CM | POA: Diagnosis not present

## 2022-03-31 DIAGNOSIS — C220 Liver cell carcinoma: Secondary | ICD-10-CM | POA: Diagnosis not present

## 2022-03-31 DIAGNOSIS — E440 Moderate protein-calorie malnutrition: Secondary | ICD-10-CM | POA: Diagnosis not present

## 2022-03-31 DIAGNOSIS — J90 Pleural effusion, not elsewhere classified: Secondary | ICD-10-CM | POA: Diagnosis not present

## 2022-04-01 DIAGNOSIS — E440 Moderate protein-calorie malnutrition: Secondary | ICD-10-CM | POA: Diagnosis not present

## 2022-04-01 DIAGNOSIS — R918 Other nonspecific abnormal finding of lung field: Secondary | ICD-10-CM | POA: Diagnosis not present

## 2022-04-01 DIAGNOSIS — J90 Pleural effusion, not elsewhere classified: Secondary | ICD-10-CM | POA: Diagnosis not present

## 2022-04-01 DIAGNOSIS — K7689 Other specified diseases of liver: Secondary | ICD-10-CM | POA: Diagnosis not present

## 2022-04-01 DIAGNOSIS — C220 Liver cell carcinoma: Secondary | ICD-10-CM | POA: Diagnosis not present

## 2022-04-02 ENCOUNTER — Ambulatory Visit: Payer: Medicare Other | Admitting: Radiation Oncology

## 2022-04-02 DIAGNOSIS — J90 Pleural effusion, not elsewhere classified: Secondary | ICD-10-CM | POA: Diagnosis not present

## 2022-04-02 DIAGNOSIS — E440 Moderate protein-calorie malnutrition: Secondary | ICD-10-CM | POA: Diagnosis not present

## 2022-04-02 DIAGNOSIS — R918 Other nonspecific abnormal finding of lung field: Secondary | ICD-10-CM | POA: Diagnosis not present

## 2022-04-02 DIAGNOSIS — K7689 Other specified diseases of liver: Secondary | ICD-10-CM | POA: Diagnosis not present

## 2022-04-02 DIAGNOSIS — C220 Liver cell carcinoma: Secondary | ICD-10-CM | POA: Diagnosis not present

## 2022-04-03 DIAGNOSIS — K7689 Other specified diseases of liver: Secondary | ICD-10-CM | POA: Diagnosis not present

## 2022-04-03 DIAGNOSIS — E440 Moderate protein-calorie malnutrition: Secondary | ICD-10-CM | POA: Diagnosis not present

## 2022-04-03 DIAGNOSIS — J90 Pleural effusion, not elsewhere classified: Secondary | ICD-10-CM | POA: Diagnosis not present

## 2022-04-03 DIAGNOSIS — C220 Liver cell carcinoma: Secondary | ICD-10-CM | POA: Diagnosis not present

## 2022-04-03 DIAGNOSIS — R918 Other nonspecific abnormal finding of lung field: Secondary | ICD-10-CM | POA: Diagnosis not present

## 2022-04-04 DIAGNOSIS — C220 Liver cell carcinoma: Secondary | ICD-10-CM | POA: Diagnosis not present

## 2022-04-04 DIAGNOSIS — J90 Pleural effusion, not elsewhere classified: Secondary | ICD-10-CM | POA: Diagnosis not present

## 2022-04-04 DIAGNOSIS — E440 Moderate protein-calorie malnutrition: Secondary | ICD-10-CM | POA: Diagnosis not present

## 2022-04-04 DIAGNOSIS — R918 Other nonspecific abnormal finding of lung field: Secondary | ICD-10-CM | POA: Diagnosis not present

## 2022-04-04 DIAGNOSIS — K7689 Other specified diseases of liver: Secondary | ICD-10-CM | POA: Diagnosis not present

## 2022-04-05 LAB — MISC LABCORP TEST (SEND OUT): Labcorp test code: 9985

## 2022-04-06 ENCOUNTER — Telehealth: Payer: Self-pay | Admitting: *Deleted

## 2022-04-06 DIAGNOSIS — J90 Pleural effusion, not elsewhere classified: Secondary | ICD-10-CM | POA: Diagnosis not present

## 2022-04-06 DIAGNOSIS — C220 Liver cell carcinoma: Secondary | ICD-10-CM | POA: Diagnosis not present

## 2022-04-06 DIAGNOSIS — E440 Moderate protein-calorie malnutrition: Secondary | ICD-10-CM | POA: Diagnosis not present

## 2022-04-06 DIAGNOSIS — R918 Other nonspecific abnormal finding of lung field: Secondary | ICD-10-CM | POA: Diagnosis not present

## 2022-04-06 DIAGNOSIS — K7689 Other specified diseases of liver: Secondary | ICD-10-CM | POA: Diagnosis not present

## 2022-04-06 NOTE — Telephone Encounter (Signed)
RN placed call to [facility] for update on pt thoracentesis.  Morene Antu with Hospice states she has faxed the order to IR with instructions to contact pt facility to schedule procedure.

## 2022-04-06 NOTE — Telephone Encounter (Signed)
Received call from pt daughter stating pt is needing thoracentesis and the soonest Hospice can facilitate procedure is Monday and requesting if our office could schedule pt any sooner. MD out of office and RN explained to pt daughter that procedures are not preformed on the weekend and the soonest our team could order and schedule would be Monday. RN also placed call to [facility] and spoke to Page who states she is working with their provider to schedule procedure and will be in contact with pt. RN provided this additional information to family who verbalized understanding.

## 2022-04-09 ENCOUNTER — Other Ambulatory Visit (HOSPITAL_COMMUNITY): Payer: Self-pay | Admitting: Hospice and Palliative Medicine

## 2022-04-09 DIAGNOSIS — E440 Moderate protein-calorie malnutrition: Secondary | ICD-10-CM | POA: Diagnosis not present

## 2022-04-09 DIAGNOSIS — J90 Pleural effusion, not elsewhere classified: Secondary | ICD-10-CM | POA: Diagnosis not present

## 2022-04-09 DIAGNOSIS — J910 Malignant pleural effusion: Secondary | ICD-10-CM

## 2022-04-09 DIAGNOSIS — R918 Other nonspecific abnormal finding of lung field: Secondary | ICD-10-CM | POA: Diagnosis not present

## 2022-04-09 DIAGNOSIS — K7689 Other specified diseases of liver: Secondary | ICD-10-CM | POA: Diagnosis not present

## 2022-04-09 DIAGNOSIS — C220 Liver cell carcinoma: Secondary | ICD-10-CM | POA: Diagnosis not present

## 2022-04-10 DIAGNOSIS — K7689 Other specified diseases of liver: Secondary | ICD-10-CM | POA: Diagnosis not present

## 2022-04-10 DIAGNOSIS — E440 Moderate protein-calorie malnutrition: Secondary | ICD-10-CM | POA: Diagnosis not present

## 2022-04-10 DIAGNOSIS — R918 Other nonspecific abnormal finding of lung field: Secondary | ICD-10-CM | POA: Diagnosis not present

## 2022-04-10 DIAGNOSIS — C220 Liver cell carcinoma: Secondary | ICD-10-CM | POA: Diagnosis not present

## 2022-04-10 DIAGNOSIS — J90 Pleural effusion, not elsewhere classified: Secondary | ICD-10-CM | POA: Diagnosis not present

## 2022-04-11 DIAGNOSIS — C220 Liver cell carcinoma: Secondary | ICD-10-CM | POA: Diagnosis not present

## 2022-04-11 DIAGNOSIS — E440 Moderate protein-calorie malnutrition: Secondary | ICD-10-CM | POA: Diagnosis not present

## 2022-04-11 DIAGNOSIS — J90 Pleural effusion, not elsewhere classified: Secondary | ICD-10-CM | POA: Diagnosis not present

## 2022-04-11 DIAGNOSIS — R918 Other nonspecific abnormal finding of lung field: Secondary | ICD-10-CM | POA: Diagnosis not present

## 2022-04-11 DIAGNOSIS — K7689 Other specified diseases of liver: Secondary | ICD-10-CM | POA: Diagnosis not present

## 2022-04-12 DIAGNOSIS — E440 Moderate protein-calorie malnutrition: Secondary | ICD-10-CM | POA: Diagnosis not present

## 2022-04-12 DIAGNOSIS — C220 Liver cell carcinoma: Secondary | ICD-10-CM | POA: Diagnosis not present

## 2022-04-12 DIAGNOSIS — K7689 Other specified diseases of liver: Secondary | ICD-10-CM | POA: Diagnosis not present

## 2022-04-12 DIAGNOSIS — J90 Pleural effusion, not elsewhere classified: Secondary | ICD-10-CM | POA: Diagnosis not present

## 2022-04-12 DIAGNOSIS — R918 Other nonspecific abnormal finding of lung field: Secondary | ICD-10-CM | POA: Diagnosis not present

## 2022-04-13 ENCOUNTER — Ambulatory Visit (HOSPITAL_COMMUNITY): Payer: Medicare Other

## 2022-04-13 DIAGNOSIS — R634 Abnormal weight loss: Secondary | ICD-10-CM | POA: Diagnosis not present

## 2022-04-13 DIAGNOSIS — E1122 Type 2 diabetes mellitus with diabetic chronic kidney disease: Secondary | ICD-10-CM | POA: Diagnosis not present

## 2022-04-13 DIAGNOSIS — I129 Hypertensive chronic kidney disease with stage 1 through stage 4 chronic kidney disease, or unspecified chronic kidney disease: Secondary | ICD-10-CM | POA: Diagnosis not present

## 2022-04-13 DIAGNOSIS — R41841 Cognitive communication deficit: Secondary | ICD-10-CM | POA: Diagnosis not present

## 2022-04-13 DIAGNOSIS — Z87440 Personal history of urinary (tract) infections: Secondary | ICD-10-CM | POA: Diagnosis not present

## 2022-04-13 DIAGNOSIS — K7689 Other specified diseases of liver: Secondary | ICD-10-CM | POA: Diagnosis not present

## 2022-04-13 DIAGNOSIS — E612 Magnesium deficiency: Secondary | ICD-10-CM | POA: Diagnosis not present

## 2022-04-13 DIAGNOSIS — R1310 Dysphagia, unspecified: Secondary | ICD-10-CM | POA: Diagnosis not present

## 2022-04-13 DIAGNOSIS — F419 Anxiety disorder, unspecified: Secondary | ICD-10-CM | POA: Diagnosis not present

## 2022-04-13 DIAGNOSIS — E039 Hypothyroidism, unspecified: Secondary | ICD-10-CM | POA: Diagnosis not present

## 2022-04-13 DIAGNOSIS — R918 Other nonspecific abnormal finding of lung field: Secondary | ICD-10-CM | POA: Diagnosis not present

## 2022-04-13 DIAGNOSIS — C220 Liver cell carcinoma: Secondary | ICD-10-CM | POA: Diagnosis not present

## 2022-04-13 DIAGNOSIS — Z8719 Personal history of other diseases of the digestive system: Secondary | ICD-10-CM | POA: Diagnosis not present

## 2022-04-13 DIAGNOSIS — N1832 Chronic kidney disease, stage 3b: Secondary | ICD-10-CM | POA: Diagnosis not present

## 2022-04-13 DIAGNOSIS — J90 Pleural effusion, not elsewhere classified: Secondary | ICD-10-CM | POA: Diagnosis not present

## 2022-04-13 DIAGNOSIS — K219 Gastro-esophageal reflux disease without esophagitis: Secondary | ICD-10-CM | POA: Diagnosis not present

## 2022-04-13 DIAGNOSIS — E559 Vitamin D deficiency, unspecified: Secondary | ICD-10-CM | POA: Diagnosis not present

## 2022-04-13 DIAGNOSIS — J302 Other seasonal allergic rhinitis: Secondary | ICD-10-CM | POA: Diagnosis not present

## 2022-04-13 DIAGNOSIS — E440 Moderate protein-calorie malnutrition: Secondary | ICD-10-CM | POA: Diagnosis not present

## 2022-04-14 DIAGNOSIS — C220 Liver cell carcinoma: Secondary | ICD-10-CM | POA: Diagnosis not present

## 2022-04-14 DIAGNOSIS — E440 Moderate protein-calorie malnutrition: Secondary | ICD-10-CM | POA: Diagnosis not present

## 2022-04-14 DIAGNOSIS — K7689 Other specified diseases of liver: Secondary | ICD-10-CM | POA: Diagnosis not present

## 2022-04-14 DIAGNOSIS — J90 Pleural effusion, not elsewhere classified: Secondary | ICD-10-CM | POA: Diagnosis not present

## 2022-04-14 DIAGNOSIS — R918 Other nonspecific abnormal finding of lung field: Secondary | ICD-10-CM | POA: Diagnosis not present

## 2022-04-15 DIAGNOSIS — K7689 Other specified diseases of liver: Secondary | ICD-10-CM | POA: Diagnosis not present

## 2022-04-15 DIAGNOSIS — R918 Other nonspecific abnormal finding of lung field: Secondary | ICD-10-CM | POA: Diagnosis not present

## 2022-04-15 DIAGNOSIS — C220 Liver cell carcinoma: Secondary | ICD-10-CM | POA: Diagnosis not present

## 2022-04-15 DIAGNOSIS — E440 Moderate protein-calorie malnutrition: Secondary | ICD-10-CM | POA: Diagnosis not present

## 2022-04-15 DIAGNOSIS — J90 Pleural effusion, not elsewhere classified: Secondary | ICD-10-CM | POA: Diagnosis not present

## 2022-04-17 ENCOUNTER — Other Ambulatory Visit: Payer: Self-pay | Admitting: Radiology

## 2022-04-17 DIAGNOSIS — J90 Pleural effusion, not elsewhere classified: Secondary | ICD-10-CM

## 2022-04-18 ENCOUNTER — Encounter (HOSPITAL_COMMUNITY): Payer: Self-pay

## 2022-04-18 ENCOUNTER — Inpatient Hospital Stay (HOSPITAL_COMMUNITY): Admission: RE | Admit: 2022-04-18 | Payer: Medicare Other | Source: Ambulatory Visit

## 2022-04-18 ENCOUNTER — Ambulatory Visit (HOSPITAL_COMMUNITY): Payer: Medicare Other

## 2022-04-23 ENCOUNTER — Ambulatory Visit: Payer: Self-pay | Admitting: Radiation Oncology

## 2022-05-13 DEATH — deceased
